# Patient Record
Sex: Female | Born: 1955
Health system: Southern US, Community
[De-identification: ages and names within clinical notes are randomized; demographics above are authoritative.]

## PROBLEM LIST (undated history)

## (undated) DIAGNOSIS — D499 Neoplasm of unspecified behavior of unspecified site: Secondary | ICD-10-CM

## (undated) DIAGNOSIS — R42 Dizziness and giddiness: Secondary | ICD-10-CM

## (undated) DIAGNOSIS — M1990 Unspecified osteoarthritis, unspecified site: Secondary | ICD-10-CM

## (undated) DIAGNOSIS — Z87898 Personal history of other specified conditions: Secondary | ICD-10-CM

## (undated) DIAGNOSIS — I10 Essential (primary) hypertension: Secondary | ICD-10-CM

## (undated) DIAGNOSIS — E785 Hyperlipidemia, unspecified: Secondary | ICD-10-CM

## (undated) HISTORY — DX: Neoplasm of unspecified behavior of unspecified site: D49.9

## (undated) HISTORY — DX: Dizziness and giddiness: R42

## (undated) HISTORY — PX: BREAST SURGERY: SHX581

## (undated) HISTORY — PX: CHOLECYSTECTOMY: SHX55

## (undated) HISTORY — DX: Essential (primary) hypertension: I10

## (undated) HISTORY — DX: Hyperlipidemia, unspecified: E78.5

## (undated) HISTORY — DX: Unspecified osteoarthritis, unspecified site: M19.90

---

## 2001-01-31 HISTORY — PX: ABDOMINAL HYSTERECTOMY: SHX81

## 2006-08-31 ENCOUNTER — Ambulatory Visit: Payer: Self-pay | Admitting: Obstetrics & Gynecology

## 2006-11-06 ENCOUNTER — Ambulatory Visit: Payer: Self-pay | Admitting: General Surgery

## 2006-12-01 ENCOUNTER — Ambulatory Visit: Payer: Self-pay | Admitting: General Surgery

## 2009-10-17 ENCOUNTER — Ambulatory Visit: Payer: Self-pay | Admitting: Orthopedic Surgery

## 2009-10-30 ENCOUNTER — Ambulatory Visit: Payer: Self-pay | Admitting: Anesthesiology

## 2011-04-24 ENCOUNTER — Emergency Department: Payer: Self-pay | Admitting: Emergency Medicine

## 2011-05-11 ENCOUNTER — Encounter: Payer: Self-pay | Admitting: Nurse Practitioner

## 2011-05-11 ENCOUNTER — Encounter: Payer: Self-pay | Admitting: Cardiothoracic Surgery

## 2011-06-01 ENCOUNTER — Encounter: Payer: Self-pay | Admitting: Cardiothoracic Surgery

## 2011-06-01 ENCOUNTER — Encounter: Payer: Self-pay | Admitting: Nurse Practitioner

## 2011-06-06 LAB — WOUND CULTURE

## 2011-07-02 ENCOUNTER — Encounter: Payer: Self-pay | Admitting: Cardiothoracic Surgery

## 2011-07-02 ENCOUNTER — Encounter: Payer: Self-pay | Admitting: Nurse Practitioner

## 2011-08-01 ENCOUNTER — Encounter: Payer: Self-pay | Admitting: Cardiothoracic Surgery

## 2011-08-01 ENCOUNTER — Encounter: Payer: Self-pay | Admitting: Nurse Practitioner

## 2011-09-01 ENCOUNTER — Encounter: Payer: Self-pay | Admitting: Nurse Practitioner

## 2011-09-01 ENCOUNTER — Encounter: Payer: Self-pay | Admitting: Cardiothoracic Surgery

## 2019-04-16 ENCOUNTER — Other Ambulatory Visit: Payer: Self-pay

## 2019-04-16 ENCOUNTER — Ambulatory Visit (INDEPENDENT_AMBULATORY_CARE_PROVIDER_SITE_OTHER): Payer: Medicare Other | Admitting: Nurse Practitioner

## 2019-04-16 ENCOUNTER — Encounter: Payer: Self-pay | Admitting: Nurse Practitioner

## 2019-04-16 VITALS — BP 138/98 | HR 80 | Temp 98.5°F | Ht 63.0 in | Wt 301.0 lb

## 2019-04-16 DIAGNOSIS — J309 Allergic rhinitis, unspecified: Secondary | ICD-10-CM | POA: Insufficient documentation

## 2019-04-16 DIAGNOSIS — M1990 Unspecified osteoarthritis, unspecified site: Secondary | ICD-10-CM | POA: Diagnosis not present

## 2019-04-16 DIAGNOSIS — J301 Allergic rhinitis due to pollen: Secondary | ICD-10-CM | POA: Diagnosis not present

## 2019-04-16 DIAGNOSIS — I10 Essential (primary) hypertension: Secondary | ICD-10-CM | POA: Insufficient documentation

## 2019-04-16 DIAGNOSIS — Z6841 Body Mass Index (BMI) 40.0 and over, adult: Secondary | ICD-10-CM | POA: Insufficient documentation

## 2019-04-16 DIAGNOSIS — Z7689 Persons encountering health services in other specified circumstances: Secondary | ICD-10-CM

## 2019-04-16 DIAGNOSIS — R030 Elevated blood-pressure reading, without diagnosis of hypertension: Secondary | ICD-10-CM

## 2019-04-16 DIAGNOSIS — Z0000 Encounter for general adult medical examination without abnormal findings: Secondary | ICD-10-CM | POA: Insufficient documentation

## 2019-04-16 NOTE — Assessment & Plan Note (Signed)
Chronic, ongoing for years.  Continue use of Flonase as needed for symptom control.  Recommend avoidance of irritants.

## 2019-04-16 NOTE — Progress Notes (Signed)
New Patient Office Visit  Subjective:  Patient ID: Jasmine Webb, female    DOB: [birth year]  Age: 64 y.o. MRN: [id number]  CC:  Chief Complaint  Patient presents with  . Establish Care    HPI Jasmine Webb presents for new patient visit to establish care.  Introduced to Designer, jewellery role and practice setting.  All questions answered.  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    Has not had a pap smear in 14 years, had complete hysterectomy including cervix.  Had a benign tumor removed.  Has not had a mammogram in a few years.  Did have a colonoscopy at age 26 or around there.  Does not get vaccinations, refuses.  [employer] recently came out to home and A1C 5.4%, plus normal circulation  Currently takes Flonase for allergies daily and Tylenol for arthritis pain as needed, every day for past two weeks has taken.  Arthritis present to hands, shoulder, knees.    HYPERTENSION Noted on exam today, no medications at this time.  She reports it was up the other day when insurance company came to visit, this was due to being up all night. Her son and grandchildren live with her.  Never had elevations in BP before per her report. Recurrent headaches: no Visual changes: no Palpitations: no Dyspnea: no Chest pain: no Lower extremity edema: little bit Dizzy/lightheaded: no  Past Medical History:  Diagnosis Date  . Tumor    breast and pelvic    Past Surgical History:  Procedure Laterality Date  . ABDOMINAL HYSTERECTOMY  2003    Family History  Problem Relation Age of Onset  . COPD Mother   . Heart disease Sister   . Diabetes Brother   . Hypertension Brother   . Asthma Son   . Breast cancer Sister   . Colon cancer Sister   . Sinusitis Son     Social History   Socioeconomic History  . Marital status: Married    Spouse name: Not on file  . Number of children: Not on file  . Years of education: Not on file  . Highest education level: Not on  file  Occupational History  . Not on file  Tobacco Use  . Smoking status: Never Smoker  . Smokeless tobacco: Never Used  Substance and Sexual Activity  . Alcohol use: Never  . Drug use: Never  . Sexual activity: Not Currently  Other Topics Concern  . Not on file  Social History Narrative  . Not on file   Social Determinants of Health   Financial Resource Strain: Low Risk   . Difficulty of Paying Living Expenses: Not hard at all  Food Insecurity: No Food Insecurity  . Worried About Charity fundraiser in the Last Year: Never true  . Ran Out of Food in the Last Year: Never true  Transportation Needs: No Transportation Needs  . Lack of Transportation (Medical): No  . Lack of Transportation (Non-Medical): No  Physical Activity: Insufficiently Active  . Days of Exercise per Week: 3 days  . Minutes of Exercise per Session: 30 min  Stress: No Stress Concern Present  . Feeling of Stress : Not at all  Social Connections: Unknown  . Frequency of Communication with Friends and Family: More than three times a week  . Frequency of Social Gatherings with Friends and Family: More than three times a week  . Attends Religious Services: More than  4 times per year  . Active Member of Clubs or Organizations: No  . Attends Archivist Meetings: Never  . Marital Status: Not on file  Intimate Partner Violence:   . Fear of Current or Ex-Partner:   . Emotionally Abused:   Marland Kitchen Physically Abused:   . Sexually Abused:     ROS Review of Systems  Constitutional: Negative for activity change, appetite change, diaphoresis, fatigue and fever.  Respiratory: Negative for cough, chest tightness and shortness of breath.   Cardiovascular: Negative for chest pain, palpitations and leg swelling.  Gastrointestinal: Negative for abdominal distention, abdominal pain, constipation, diarrhea, nausea and vomiting.  Endocrine: Negative for cold intolerance, heat intolerance, polydipsia, polyphagia and  polyuria.  Neurological: Negative for dizziness, syncope, weakness, light-headedness, numbness and headaches.  Psychiatric/Behavioral: Negative.     Objective:   Today's Vitals: BP (!) 138/98 (BP Location: Left Arm, Patient Position: Sitting)   Pulse 80   Temp 98.5 F (36.9 C) (Oral)   Ht 5\' 3"  (1.6 m)   Wt (!) 301 lb (136.5 kg)   LMP  (LMP Unknown)   SpO2 96%   BMI 53.32 kg/m   Physical Exam Vitals and nursing note reviewed.  Constitutional:      General: She is awake. She is not in acute distress.    Appearance: She is well-developed and well-groomed. She is morbidly obese. She is not ill-appearing.  HENT:     Head: Normocephalic.     Right Ear: Hearing normal.     Left Ear: Hearing normal.  Eyes:     General: Lids are normal.        Right eye: No discharge.        Left eye: No discharge.     Conjunctiva/sclera: Conjunctivae normal.     Pupils: Pupils are equal, round, and reactive to light.  Neck:     Thyroid: No thyromegaly.     Vascular: No carotid bruit.  Cardiovascular:     Rate and Rhythm: Normal rate and regular rhythm.     Heart sounds: Normal heart sounds. No murmur. No gallop.   Pulmonary:     Effort: Pulmonary effort is normal. No accessory muscle usage or respiratory distress.     Breath sounds: Normal breath sounds.  Abdominal:     General: Bowel sounds are normal.     Palpations: Abdomen is soft.  Musculoskeletal:     Cervical back: Normal range of motion and neck supple.     Right lower leg: Edema (trace) present.     Left lower leg: Edema (trace) present.  Skin:    General: Skin is warm and dry.  Neurological:     Mental Status: She is alert and oriented to person, place, and time.  Psychiatric:        Attention and Perception: Attention normal.        Mood and Affect: Mood normal.        Speech: Speech normal.        Behavior: Behavior normal. Behavior is cooperative.        Thought Content: Thought content normal.     Assessment &  Plan:   Problem List Items Addressed This Visit      Respiratory   Allergic rhinitis    Chronic, ongoing for years.  Continue use of Flonase as needed for symptom control.  Recommend avoidance of irritants.          Musculoskeletal and Integument   Arthritis    Chronic,  ongoing.  Suspect OA to multiple joints (hands, knees, shoulders).  Recommend continued use of Tylenol as needed.  Use of heat/ice and OTC creams like Voltaren gel.  Avoid Ibuprofen products.  Discussed benefit of modest weight loss and focus on healthy diet and regular exercise.      Relevant Medications   acetaminophen (TYLENOL) 500 MG tablet     Other   Elevated BP without diagnosis of hypertension    Elevations in office.  Recommend she monitor BP at least a few times a week and document over next 4 weeks for provider.  Focus on DASH diet at home, provided information to her on this.  Recommend focus on modest weight loss.  Return to office for annual physical in 4 weeks, if continued elevation then consider addition of medication.  Discussed at length with her.      Preventative health care    NEEDS: - DEXA - Mammogram - PNA vaccine - Tetanus - colonoscopy - flu vaccine Discussed at length with patient recommendations for each and needs.  Will follow-up at physical and order if patient agreeable.      BMI 50.0-59.9, adult ([place])    Recommend continued focus on healthy diet choices and regular physical activity (30 minutes 5 days a week).  Focus on small goals at a time and set timeline to achieve these goals.       Other Visit Diagnoses    Encounter to establish care    -  Primary      Outpatient Encounter Medications as of 04/16/2019  Medication Sig  . acetaminophen (TYLENOL) 500 MG tablet Take 500 mg by mouth every 6 (six) hours as needed.  . fluticasone (FLONASE) 50 MCG/ACT nasal spray Place 2 sprays into both nostrils daily.   No facility-administered encounter medications on file as of  04/16/2019.    Follow-up: Return in about 4 weeks (around 05/14/2019) for Annual physical.   Venita Lick, NP

## 2019-04-16 NOTE — Patient Instructions (Signed)
DASH Eating Plan DASH stands for "Dietary Approaches to Stop Hypertension." The DASH eating plan is a healthy eating plan that has been shown to reduce high blood pressure (hypertension). It may also reduce your risk for type 2 diabetes, heart disease, and stroke. The DASH eating plan may also help with weight loss. What are tips for following this plan?  General guidelines  Avoid eating more than 2,300 mg (milligrams) of salt (sodium) a day. If you have hypertension, you may need to reduce your sodium intake to 1,500 mg a day.  Limit alcohol intake to no more than 1 drink a day for nonpregnant women and 2 drinks a day for men. One drink equals 12 oz of beer, 5 oz of wine, or 1 oz of hard liquor.  Work with your health care provider to maintain a healthy body weight or to lose weight. Ask what an ideal weight is for you.  Get at least 30 minutes of exercise that causes your heart to beat faster (aerobic exercise) most days of the week. Activities may include walking, swimming, or biking.  Work with your health care provider or diet and nutrition specialist (dietitian) to adjust your eating plan to your individual calorie needs. Reading food labels   Check food labels for the amount of sodium per serving. Choose foods with less than 5 percent of the Daily Value of sodium. Generally, foods with less than 300 mg of sodium per serving fit into this eating plan.  To find whole grains, look for the word "whole" as the first word in the ingredient list. Shopping  Buy products labeled as "low-sodium" or "no salt added."  Buy fresh foods. Avoid canned foods and premade or frozen meals. Cooking  Avoid adding salt when cooking. Use salt-free seasonings or herbs instead of table salt or sea salt. Check with your health care provider or pharmacist before using salt substitutes.  Do not fry foods. Cook foods using healthy methods such as baking, boiling, grilling, and broiling instead.  Cook with  heart-healthy oils, such as olive, canola, soybean, or sunflower oil. Meal planning  Eat a balanced diet that includes: ? 5 or more servings of fruits and vegetables each day. At each meal, try to fill half of your plate with fruits and vegetables. ? Up to 6-8 servings of whole grains each day. ? Less than 6 oz of lean meat, poultry, or fish each day. A 3-oz serving of meat is about the same size as a deck of cards. One egg equals 1 oz. ? 2 servings of low-fat dairy each day. ? A serving of nuts, seeds, or beans 5 times each week. ? Heart-healthy fats. Healthy fats called Omega-3 fatty acids are found in foods such as flaxseeds and coldwater fish, like sardines, salmon, and mackerel.  Limit how much you eat of the following: ? Canned or prepackaged foods. ? Food that is high in trans fat, such as fried foods. ? Food that is high in saturated fat, such as fatty meat. ? Sweets, desserts, sugary drinks, and other foods with added sugar. ? Full-fat dairy products.  Do not salt foods before eating.  Try to eat at least 2 vegetarian meals each week.  Eat more home-cooked food and less restaurant, buffet, and fast food.  When eating at a restaurant, ask that your food be prepared with less salt or no salt, if possible. What foods are recommended? The items listed may not be a complete list. Talk with your dietitian about   what dietary choices are best for you. Grains Whole-grain or whole-wheat bread. Whole-grain or whole-wheat pasta. Brown rice. Oatmeal. Quinoa. Bulgur. Whole-grain and low-sodium cereals. Pita bread. Low-fat, low-sodium crackers. Whole-wheat flour tortillas. Vegetables Fresh or frozen vegetables (raw, steamed, roasted, or grilled). Low-sodium or reduced-sodium tomato and vegetable juice. Low-sodium or reduced-sodium tomato sauce and tomato paste. Low-sodium or reduced-sodium canned vegetables. Fruits All fresh, dried, or frozen fruit. Canned fruit in natural juice (without  added sugar). Meat and other protein foods Skinless chicken or turkey. Ground chicken or turkey. Pork with fat trimmed off. Fish and seafood. Egg whites. Dried beans, peas, or lentils. Unsalted nuts, nut butters, and seeds. Unsalted canned beans. Lean cuts of beef with fat trimmed off. Low-sodium, lean deli meat. Dairy Low-fat (1%) or fat-free (skim) milk. Fat-free, low-fat, or reduced-fat cheeses. Nonfat, low-sodium ricotta or cottage cheese. Low-fat or nonfat yogurt. Low-fat, low-sodium cheese. Fats and oils Soft margarine without trans fats. Vegetable oil. Low-fat, reduced-fat, or light mayonnaise and salad dressings (reduced-sodium). Canola, safflower, olive, soybean, and sunflower oils. Avocado. Seasoning and other foods Herbs. Spices. Seasoning mixes without salt. Unsalted popcorn and pretzels. Fat-free sweets. What foods are not recommended? The items listed may not be a complete list. Talk with your dietitian about what dietary choices are best for you. Grains Baked goods made with fat, such as croissants, muffins, or some breads. Dry pasta or rice meal packs. Vegetables Creamed or fried vegetables. Vegetables in a cheese sauce. Regular canned vegetables (not low-sodium or reduced-sodium). Regular canned tomato sauce and paste (not low-sodium or reduced-sodium). Regular tomato and vegetable juice (not low-sodium or reduced-sodium). Pickles. Olives. Fruits Canned fruit in a light or heavy syrup. Fried fruit. Fruit in cream or butter sauce. Meat and other protein foods Fatty cuts of meat. Ribs. Fried meat. Bacon. Sausage. Bologna and other processed lunch meats. Salami. Fatback. Hotdogs. Bratwurst. Salted nuts and seeds. Canned beans with added salt. Canned or smoked fish. Whole eggs or egg yolks. Chicken or turkey with skin. Dairy Whole or 2% milk, cream, and half-and-half. Whole or full-fat cream cheese. Whole-fat or sweetened yogurt. Full-fat cheese. Nondairy creamers. Whipped toppings.  Processed cheese and cheese spreads. Fats and oils Butter. Stick margarine. Lard. Shortening. Ghee. Bacon fat. Tropical oils, such as coconut, palm kernel, or palm oil. Seasoning and other foods Salted popcorn and pretzels. Onion salt, garlic salt, seasoned salt, table salt, and sea salt. Worcestershire sauce. Tartar sauce. Barbecue sauce. Teriyaki sauce. Soy sauce, including reduced-sodium. Steak sauce. Canned and packaged gravies. Fish sauce. Oyster sauce. Cocktail sauce. Horseradish that you find on the shelf. Ketchup. Mustard. Meat flavorings and tenderizers. Bouillon cubes. Hot sauce and Tabasco sauce. Premade or packaged marinades. Premade or packaged taco seasonings. Relishes. Regular salad dressings. Where to find more information:  National Heart, Lung, and Blood Institute: www.nhlbi.nih.gov  American Heart Association: www.heart.org Summary  The DASH eating plan is a healthy eating plan that has been shown to reduce high blood pressure (hypertension). It may also reduce your risk for type 2 diabetes, heart disease, and stroke.  With the DASH eating plan, you should limit salt (sodium) intake to 2,300 mg a day. If you have hypertension, you may need to reduce your sodium intake to 1,500 mg a day.  When on the DASH eating plan, aim to eat more fresh fruits and vegetables, whole grains, lean proteins, low-fat dairy, and heart-healthy fats.  Work with your health care provider or diet and nutrition specialist (dietitian) to adjust your eating plan to your   individual calorie needs. This information is not intended to replace advice given to you by your health care provider. Make sure you discuss any questions you have with your health care provider. Document Revised: 12/30/2016 Document Reviewed: 01/11/2016 Elsevier Patient Education  2020 [employer]  

## 2019-04-16 NOTE — Assessment & Plan Note (Signed)
Elevations in office.  Recommend she monitor BP at least a few times a week and document over next 4 weeks for provider.  Focus on DASH diet at home, provided information to her on this.  Recommend focus on modest weight loss.  Return to office for annual physical in 4 weeks, if continued elevation then consider addition of medication.  Discussed at length with her.

## 2019-04-16 NOTE — Assessment & Plan Note (Signed)
Recommend continued focus on healthy diet choices and regular physical activity (30 minutes 5 days a week).  Focus on small goals at a time and set timeline to achieve these goals.

## 2019-04-16 NOTE — Assessment & Plan Note (Signed)
Chronic, ongoing.  Suspect OA to multiple joints (hands, knees, shoulders).  Recommend continued use of Tylenol as needed.  Use of heat/ice and OTC creams like Voltaren gel.  Avoid Ibuprofen products.  Discussed benefit of modest weight loss and focus on healthy diet and regular exercise.

## 2019-04-16 NOTE — Assessment & Plan Note (Signed)
NEEDS: - DEXA - Mammogram - PNA vaccine - Tetanus - colonoscopy - flu vaccine Discussed at length with patient recommendations for each and needs.  Will follow-up at physical and order if patient agreeable.

## 2019-05-13 LAB — FECAL OCCULT BLOOD, IMMUNOCHEMICAL: IFOBT: NEGATIVE

## 2019-05-21 ENCOUNTER — Encounter: Payer: Self-pay | Admitting: Nurse Practitioner

## 2019-05-22 ENCOUNTER — Ambulatory Visit (INDEPENDENT_AMBULATORY_CARE_PROVIDER_SITE_OTHER): Payer: Medicare Other | Admitting: Nurse Practitioner

## 2019-05-22 ENCOUNTER — Other Ambulatory Visit: Payer: Self-pay

## 2019-05-22 ENCOUNTER — Encounter: Payer: Self-pay | Admitting: Nurse Practitioner

## 2019-05-22 VITALS — BP 152/88 | HR 69 | Temp 98.2°F | Ht 63.0 in | Wt 305.2 lb

## 2019-05-22 DIAGNOSIS — E559 Vitamin D deficiency, unspecified: Secondary | ICD-10-CM | POA: Diagnosis not present

## 2019-05-22 DIAGNOSIS — Z0000 Encounter for general adult medical examination without abnormal findings: Secondary | ICD-10-CM | POA: Diagnosis not present

## 2019-05-22 DIAGNOSIS — Z780 Asymptomatic menopausal state: Secondary | ICD-10-CM

## 2019-05-22 DIAGNOSIS — Z1231 Encounter for screening mammogram for malignant neoplasm of breast: Secondary | ICD-10-CM

## 2019-05-22 DIAGNOSIS — Z6841 Body Mass Index (BMI) 40.0 and over, adult: Secondary | ICD-10-CM

## 2019-05-22 DIAGNOSIS — I10 Essential (primary) hypertension: Secondary | ICD-10-CM | POA: Diagnosis not present

## 2019-05-22 DIAGNOSIS — Z1329 Encounter for screening for other suspected endocrine disorder: Secondary | ICD-10-CM | POA: Diagnosis not present

## 2019-05-22 DIAGNOSIS — R030 Elevated blood-pressure reading, without diagnosis of hypertension: Secondary | ICD-10-CM | POA: Diagnosis not present

## 2019-05-22 DIAGNOSIS — J301 Allergic rhinitis due to pollen: Secondary | ICD-10-CM

## 2019-05-22 DIAGNOSIS — R5383 Other fatigue: Secondary | ICD-10-CM | POA: Diagnosis not present

## 2019-05-22 DIAGNOSIS — Z136 Encounter for screening for cardiovascular disorders: Secondary | ICD-10-CM | POA: Diagnosis not present

## 2019-05-22 DIAGNOSIS — M1990 Unspecified osteoarthritis, unspecified site: Secondary | ICD-10-CM | POA: Diagnosis not present

## 2019-05-22 MED ORDER — LOSARTAN POTASSIUM 50 MG PO TABS: 50.0000 mg | ORAL_TABLET | Freq: Every day | ORAL | 3 refills | Status: DC

## 2019-05-22 NOTE — Patient Instructions (Signed)
To schedule mammogram and bone scan please call following: [facility] at [place]: [address] [place], [place], [place] [address]  Phone: [phone]   DASH Eating Plan DASH stands for "Dietary Approaches to Stop Hypertension." The DASH eating plan is a healthy eating plan that has been shown to reduce high blood pressure (hypertension). It may also reduce your risk for type 2 diabetes, heart disease, and stroke. The DASH eating plan may also help with weight loss. What are tips for following this plan?  General guidelines  Avoid eating more than 2,300 mg (milligrams) of salt (sodium) a day. If you have hypertension, you may need to reduce your sodium intake to 1,500 mg a day.  Limit alcohol intake to no more than 1 drink a day for nonpregnant women and 2 drinks a day for men. One drink equals 12 oz of beer, 5 oz of wine, or 1 oz of hard liquor.  Work with your health care provider to maintain a healthy body weight or to lose weight. Ask what an ideal weight is for you.  Get at least 30 minutes of exercise that causes your heart to beat faster (aerobic exercise) most days of the week. Activities may include walking, swimming, or biking.  Work with your health care provider or diet and nutrition specialist (dietitian) to adjust your eating plan to your individual calorie needs. Reading food labels   Check food labels for the amount of sodium per serving. Choose foods with less than 5 percent of the Daily Value of sodium. Generally, foods with less than 300 mg of sodium per serving fit into this eating plan.  To find whole grains, look for the word "whole" as the first word in the ingredient list. Shopping  Buy products labeled as "low-sodium" or "no salt added."  Buy fresh foods. Avoid canned foods and premade or frozen meals. Cooking  Avoid adding salt when cooking. Use salt-free seasonings or herbs instead of table salt or sea salt. Check with  your health care provider or pharmacist before using salt substitutes.  Do not fry foods. Cook foods using healthy methods such as baking, boiling, grilling, and broiling instead.  Cook with heart-healthy oils, such as olive, canola, soybean, or sunflower oil. Meal planning  Eat a balanced diet that includes: ? 5 or more servings of fruits and vegetables each day. At each meal, try to fill half of your plate with fruits and vegetables. ? Up to 6-8 servings of whole grains each day. ? Less than 6 oz of lean meat, poultry, or fish each day. A 3-oz serving of meat is about the same size as a deck of cards. One egg equals 1 oz. ? 2 servings of low-fat dairy each day. ? A serving of nuts, seeds, or beans 5 times each week. ? Heart-healthy fats. Healthy fats called Omega-3 fatty acids are found in foods such as flaxseeds and coldwater fish, like sardines, salmon, and mackerel.  Limit how much you eat of the following: ? Canned or prepackaged foods. ? Food that is high in trans fat, such as fried foods. ? Food that is high in saturated fat, such as fatty meat. ? Sweets, desserts, sugary drinks, and other foods with added sugar. ? Full-fat dairy products.  Do not salt foods before eating.  Try to eat at least 2 vegetarian meals each week.  Eat more home-cooked food and less restaurant, buffet, and fast food.  When eating at a restaurant, ask  that your food be prepared with less salt or no salt, if possible. What foods are recommended? The items listed may not be a complete list. Talk with your dietitian about what dietary choices are best for you. Grains Whole-grain or whole-wheat bread. Whole-grain or whole-wheat pasta. Brown rice. [place]row. Bulgur. Whole-grain and low-sodium cereals. Pita bread. Low-fat, low-sodium crackers. Whole-wheat flour tortillas. Vegetables Fresh or frozen vegetables (raw, steamed, roasted, or grilled). Low-sodium or reduced-sodium tomato and vegetable  juice. Low-sodium or reduced-sodium tomato sauce and tomato paste. Low-sodium or reduced-sodium canned vegetables. Fruits All fresh, dried, or frozen fruit. Canned fruit in natural juice (without added sugar). Meat and other protein foods Skinless chicken or [place]. Ground chicken or [place]. Pork with fat trimmed off. Fish and seafood. Egg whites. Dried beans, peas, or lentils. Unsalted nuts, nut butters, and seeds. Unsalted canned beans. Lean cuts of beef with fat trimmed off. Low-sodium, lean deli meat. Dairy Low-fat (1%) or fat-free (skim) milk. Fat-free, low-fat, or reduced-fat cheeses. Nonfat, low-sodium ricotta or cottage cheese. Low-fat or nonfat yogurt. Low-fat, low-sodium cheese. Fats and oils Soft margarine without trans fats. Vegetable oil. Low-fat, reduced-fat, or light mayonnaise and salad dressings (reduced-sodium). Canola, safflower, olive, soybean, and sunflower oils. Avocado. Seasoning and other foods Herbs. Spices. Seasoning mixes without salt. Unsalted popcorn and pretzels. Fat-free sweets. What foods are not recommended? The items listed may not be a complete list. Talk with your dietitian about what dietary choices are best for you. Grains Baked goods made with fat, such as croissants, muffins, or some breads. Dry pasta or rice meal packs. Vegetables Creamed or fried vegetables. Vegetables in a cheese sauce. Regular canned vegetables (not low-sodium or reduced-sodium). Regular canned tomato sauce and paste (not low-sodium or reduced-sodium). Regular tomato and vegetable juice (not low-sodium or reduced-sodium). Angie Fava. Olives. Fruits Canned fruit in a light or heavy syrup. Fried fruit. Fruit in cream or butter sauce. Meat and other protein foods Fatty cuts of meat. Ribs. Fried meat. Berniece Salines. Sausage. Bologna and other processed lunch meats. Salami. Fatback. Hotdogs. Bratwurst. Salted nuts and seeds. Canned beans with added salt. Canned or smoked fish. Whole eggs or egg yolks.  Chicken or [place] with skin. Dairy Whole or 2% milk, cream, and half-and-half. Whole or full-fat cream cheese. Whole-fat or sweetened yogurt. Full-fat cheese. Nondairy creamers. Whipped toppings. Processed cheese and cheese spreads. Fats and oils Butter. Stick margarine. Lard. Shortening. Ghee. Bacon fat. Tropical oils, such as coconut, palm kernel, or palm oil. Seasoning and other foods Salted popcorn and pretzels. Onion salt, garlic salt, seasoned salt, table salt, and sea salt. Worcestershire sauce. Tartar sauce. Barbecue sauce. Teriyaki sauce. Soy sauce, including reduced-sodium. Steak sauce. Canned and packaged gravies. Fish sauce. Oyster sauce. Cocktail sauce. Horseradish that you find on the shelf. Ketchup. Mustard. Meat flavorings and tenderizers. Bouillon cubes. Hot sauce and Tabasco sauce. Premade or packaged marinades. Premade or packaged taco seasonings. Relishes. Regular salad dressings. Where to find more information:  National Heart, Lung, and [place]: https://wilson-eaton.com/  American Heart Association: www.heart.org Summary  The DASH eating plan is a healthy eating plan that has been shown to reduce high blood pressure (hypertension). It may also reduce your risk for type 2 diabetes, heart disease, and stroke.  With the DASH eating plan, you should limit salt (sodium) intake to 2,300 mg a day. If you have hypertension, you may need to reduce your sodium intake to 1,500 mg a day.  When on the DASH eating plan, aim to eat more fresh fruits  and vegetables, whole grains, lean proteins, low-fat dairy, and heart-healthy fats.  Work with your health care provider or diet and nutrition specialist (dietitian) to adjust your eating plan to your individual calorie needs. This information is not intended to replace advice given to you by your health care provider. Make sure you discuss any questions you have with your health care provider. Document Revised: 12/30/2016 Document Reviewed:  01/11/2016 Elsevier Patient Education  2020 [employer].

## 2019-05-22 NOTE — Assessment & Plan Note (Signed)
New diagnosis, BP remains elevated in office and on home readings.  Recommend she continue to monitor BP at home regularly and document.  Praised for doing this and bringing to office today.  Will start Losartan 50 MG daily, script sent.  Plan to hold this and change regimen if K+ returns elevated on labs obtained today.  CBC, CMP, TSH, lipid panel today.  Return in 6 weeks for follow-up.

## 2019-05-22 NOTE — Assessment & Plan Note (Signed)
NEEDS: - DEXA - ordered - Mammogram - ordered - PNA vaccine - refuses - Tetanus - refuses - colonoscopy - iFOBT done in April 2021 -- negative - flu vaccine -- refuses Discussed at length with patient recommendations for each and needs. She refuses vaccinations.

## 2019-05-22 NOTE — Assessment & Plan Note (Signed)
Recommended eating smaller high protein, low fat meals more frequently and exercising 30 mins a day 5 times a week with a goal of 10-15lb weight loss in the next 3 months. Patient voiced their understanding and motivation to adhere to these recommendations.  

## 2019-05-22 NOTE — Assessment & Plan Note (Signed)
Chronic, ongoing.  Suspect OA to multiple joints (hands, knees, shoulders).  Recommend continued use of Tylenol as needed.  Use of heat/ice and OTC creams like Voltaren gel.  Avoid Ibuprofen products.  Discussed benefit of modest weight loss and focus on healthy diet and regular exercise.

## 2019-05-22 NOTE — Assessment & Plan Note (Signed)
Chronic, ongoing for years.  Continue use of Flonase as needed for symptom control.  Recommend avoidance of irritants.

## 2019-05-22 NOTE — Progress Notes (Signed)
BP (!) 152/88 (BP Location: Left Arm)   Pulse 69   Temp 98.2 F (36.8 C) (Oral)   Ht 5\' 3"  (1.6 m)   Wt (!) 305 lb 3.2 oz (138.4 kg)   LMP  (LMP Unknown)   SpO2 96%   BMI 54.06 kg/m    Subjective:    Patient ID: Jasmine Webb, female    DOB: 1955/03/31, 64 y.o.   MRN: [id number]  HPI: Jasmine Webb is a 64 y.o. female presenting on 05/22/2019 for comprehensive medical examination. Current medical complaints include:none  She currently lives with: self Menopausal Symptoms: no   HYPERTENSION Currently no medications, but elevations noted. Hypertension status: uncontrolled  BP monitoring frequency:  daily BP range: 160-170/80-90' Medication compliance: good compliance Aspirin: no Recurrent headaches: no Visual changes: no Palpitations: no Dyspnea: no Chest pain: no Lower extremity edema: no Dizzy/lightheaded: no  Depression Screen done today and results listed below:  Depression screen [facility] 2/9 05/22/2019 04/16/2019  Decreased Interest 0 0  Down, Depressed, Hopeless 0 0  PHQ - 2 Score 0 0    The patient does not have a history of falls. I did not complete a risk assessment for falls. A plan of care for falls was not documented.   Past Medical History:  Past Medical History:  Diagnosis Date  . Arthritis   . Tumor    breast and pelvic    Surgical History:  Past Surgical History:  Procedure Laterality Date  . ABDOMINAL HYSTERECTOMY  2003  . BREAST SURGERY      Medications:  Current Outpatient Medications on File Prior to Visit  Medication Sig  . acetaminophen (TYLENOL) 500 MG tablet Take 500 mg by mouth every 6 (six) hours as needed.  . fluticasone (FLONASE) 50 MCG/ACT nasal spray Place 2 sprays into both nostrils daily.   No current facility-administered medications on file prior to visit.    Allergies:  No Known Allergies  Social History:  Social History   Socioeconomic History  . Marital status: Married    Spouse name: Not on file  . Number  of children: Not on file  . Years of education: Not on file  . Highest education level: Not on file  Occupational History  . Not on file  Tobacco Use  . Smoking status: Never Smoker  . Smokeless tobacco: Never Used  Substance and Sexual Activity  . Alcohol use: Never  . Drug use: Never  . Sexual activity: Not Currently  Other Topics Concern  . Not on file  Social History Narrative  . Not on file   Social Determinants of Health   Financial Resource Strain: Low Risk   . Difficulty of Paying Living Expenses: Not hard at all  Food Insecurity: No Food Insecurity  . Worried About Charity fundraiser in the Last Year: Never true  . Ran Out of Food in the Last Year: Never true  Transportation Needs: No Transportation Needs  . Lack of Transportation (Medical): No  . Lack of Transportation (Non-Medical): No  Physical Activity: Insufficiently Active  . Days of Exercise per Week: 3 days  . Minutes of Exercise per Session: 30 min  Stress: No Stress Concern Present  . Feeling of Stress : Not at all  Social Connections: Unknown  . Frequency of Communication with Friends and Family: More than three times a week  . Frequency of Social Gatherings with Friends and Family: More than three times a week  . Attends Religious Services: More  than 4 times per year  . Active Member of Clubs or Organizations: No  . Attends Archivist Meetings: Never  . Marital Status: Not on file  Intimate Partner Violence:   . Fear of Current or Ex-Partner:   . Emotionally Abused:   Marland Kitchen Physically Abused:   . Sexually Abused:    Social History   Tobacco Use  Smoking Status Never Smoker  Smokeless Tobacco Never Used   Social History   Substance and Sexual Activity  Alcohol Use Never    Family History:  Family History  Problem Relation Age of Onset  . COPD Mother   . ALS Father   . Heart disease Sister   . Diabetes Brother   . Hypertension Brother   . Asthma Son   . Breast cancer Sister    . Colon cancer Sister   . Sinusitis Son     Past medical history, surgical history, medications, allergies, family history and social history reviewed with patient today and changes made to appropriate areas of the chart.   Review of Systems - negative All other ROS negative except what is listed above and in the HPI.      Objective:    BP (!) 152/88 (BP Location: Left Arm)   Pulse 69   Temp 98.2 F (36.8 C) (Oral)   Ht 5\' 3"  (1.6 m)   Wt (!) 305 lb 3.2 oz (138.4 kg)   LMP  (LMP Unknown)   SpO2 96%   BMI 54.06 kg/m   Wt Readings from Last 3 Encounters:  05/22/19 (!) 305 lb 3.2 oz (138.4 kg)  04/16/19 (!) 301 lb (136.5 kg)    Physical Exam Vitals and nursing note reviewed.  Constitutional:      General: She is awake. She is not in acute distress.    Appearance: She is well-developed. She is morbidly obese. She is not ill-appearing.  HENT:     Head: Normocephalic and atraumatic.     Right Ear: Hearing, tympanic membrane, ear canal and external ear normal. No drainage.     Left Ear: Hearing, tympanic membrane, ear canal and external ear normal. No drainage.     Nose: Nose normal.     Right Sinus: No maxillary sinus tenderness or frontal sinus tenderness.     Left Sinus: No maxillary sinus tenderness or frontal sinus tenderness.     Mouth/Throat:     Mouth: Mucous membranes are moist.     Pharynx: Oropharynx is clear. Uvula midline. No pharyngeal swelling, oropharyngeal exudate or posterior oropharyngeal erythema.  Eyes:     General: Lids are normal.        Right eye: No discharge.        Left eye: No discharge.     Extraocular Movements: Extraocular movements intact.     Conjunctiva/sclera: Conjunctivae normal.     Pupils: Pupils are equal, round, and reactive to light.     Visual Fields: Right eye visual fields normal and left eye visual fields normal.  Neck:     Thyroid: No thyromegaly.     Vascular: No carotid bruit.     Trachea: Trachea normal.  Cardiovascular:      Rate and Rhythm: Normal rate and regular rhythm.     Heart sounds: Normal heart sounds. No murmur. No gallop.   Pulmonary:     Effort: Pulmonary effort is normal. No accessory muscle usage or respiratory distress.     Breath sounds: Normal breath sounds.  Chest:  Breasts:        Right: Normal.        Left: Normal.  Abdominal:     General: Bowel sounds are normal.     Palpations: Abdomen is soft. There is no hepatomegaly or splenomegaly.     Tenderness: There is no abdominal tenderness.  Musculoskeletal:        General: Normal range of motion.     Cervical back: Normal range of motion and neck supple.     Right lower leg: No edema.     Left lower leg: No edema.  Lymphadenopathy:     Head:     Right side of head: No submental, submandibular, tonsillar, preauricular or posterior auricular adenopathy.     Left side of head: No submental, submandibular, tonsillar, preauricular or posterior auricular adenopathy.     Cervical: No cervical adenopathy.     Upper Body:     Right upper body: No supraclavicular, axillary or pectoral adenopathy.     Left upper body: No supraclavicular, axillary or pectoral adenopathy.  Skin:    General: Skin is warm and dry.     Capillary Refill: Capillary refill takes less than 2 seconds.     Findings: No rash.  Neurological:     Mental Status: She is alert and oriented to person, place, and time.     Cranial Nerves: Cranial nerves are intact.     Gait: Gait is intact.     Deep Tendon Reflexes: Reflexes are normal and symmetric.     Reflex Scores:      Brachioradialis reflexes are 2+ on the right side and 2+ on the left side.      Patellar reflexes are 2+ on the right side and 2+ on the left side. Psychiatric:        Attention and Perception: Attention normal.        Mood and Affect: Mood normal.        Speech: Speech normal.        Behavior: Behavior normal. Behavior is cooperative.        Thought Content: Thought content normal.         Judgment: Judgment normal.    Results for orders placed or performed in visit on 05/21/19  Fecal occult blood, imunochemical  Result Value Ref Range   IFOBT Negative       Assessment & Plan:   Problem List Items Addressed This Visit      Cardiovascular and Mediastinum   Essential hypertension    New diagnosis, BP remains elevated in office and on home readings.  Recommend she continue to monitor BP at home regularly and document.  Praised for doing this and bringing to office today.  Will start Losartan 50 MG daily, script sent.  Plan to hold this and change regimen if K+ returns elevated on labs obtained today.  CBC, CMP, TSH, lipid panel today.  Return in 6 weeks for follow-up.      Relevant Medications   losartan (COZAAR) 50 MG tablet   Other Relevant Orders   CBC with Differential/Platelet   Comprehensive metabolic panel   TSH     Respiratory   Allergic rhinitis    Chronic, ongoing for years.  Continue use of Flonase as needed for symptom control.  Recommend avoidance of irritants.          Musculoskeletal and Integument   Arthritis    Chronic, ongoing.  Suspect OA to multiple joints (hands, knees, shoulders).  Recommend continued  use of Tylenol as needed.  Use of heat/ice and OTC creams like Voltaren gel.  Avoid Ibuprofen products.  Discussed benefit of modest weight loss and focus on healthy diet and regular exercise.        Other   Preventative health care    NEEDS: - DEXA - ordered - Mammogram - ordered - PNA vaccine - refuses - Tetanus - refuses - colonoscopy - iFOBT done in April 2021 -- negative - flu vaccine -- refuses Discussed at length with patient recommendations for each and needs. She refuses vaccinations.      BMI 50.0-59.9, adult (HCC)    Recommended eating smaller high protein, low fat meals more frequently and exercising 30 mins a day 5 times a week with a goal of 10-15lb weight loss in the next 3 months. Patient voiced their understanding and  motivation to adhere to these recommendations.        Other Visit Diagnoses    Annual physical exam    -  Primary   Relevant Orders   Lipid Panel w/o Chol/HDL Ratio   Vitamin D deficiency       Reports history of low levels, check today and start supplement as needed + obtain DEXA scan.   Relevant Orders   VITAMIN D 25 Hydroxy (Vit-D Deficiency, Fractures)   Thyroid disorder screen       TSH ordered today.   Relevant Orders   TSH   Encounter for screening mammogram for malignant neoplasm of breast       Mammogram ordered   Relevant Orders   MM DIGITAL SCREENING BILATERAL   Postmenopausal estrogen deficiency       DEXA scan ordered.   Relevant Orders   DG Bone Density       Follow up plan: Return in about 6 weeks (around 07/03/2019) for HTN follow-up.   LABORATORY TESTING:  - Pap smear: not applicable  IMMUNIZATIONS:   - Tdap: Tetanus vaccination status reviewed: refuses - Influenza: refused - Pneumovax: Refused - Prevnar: Refused - HPV: Not applicable - Zostavax vaccine: Refused  SCREENING: -Mammogram: Up to date  - Colonoscopy: FIT test negative  - Bone Density: Ordered today  -Hearing Test: Not applicable  -Spirometry: Not applicable   PATIENT COUNSELING:   Advised to take 1 mg of folate supplement per day if capable of pregnancy.   Sexuality: Discussed sexually transmitted diseases, partner selection, use of condoms, avoidance of unintended pregnancy  and contraceptive alternatives.   Advised to avoid cigarette smoking.  I discussed with the patient that most people either abstain from alcohol or drink within safe limits (<=14/week and <=4 drinks/occasion for males, <=7/weeks and <= 3 drinks/occasion for females) and that the risk for alcohol disorders and other health effects rises proportionally with the number of drinks per week and how often a drinker exceeds daily limits.  Discussed cessation/primary prevention of drug use and availability of treatment  for abuse.   Diet: Encouraged to adjust caloric intake to maintain  or achieve ideal body weight, to reduce intake of dietary saturated fat and total fat, to limit sodium intake by avoiding high sodium foods and not adding table salt, and to maintain adequate dietary potassium and calcium preferably from fresh fruits, vegetables, and low-fat dairy products.    stressed the importance of regular exercise  Injury prevention: Discussed safety belts, safety helmets, smoke detector, smoking near bedding or upholstery.   Dental health: Discussed importance of regular tooth brushing, flossing, and dental visits.  NEXT PREVENTATIVE PHYSICAL DUE IN 1 YEAR. Return in about 6 weeks (around 07/03/2019) for HTN follow-up.

## 2019-05-23 LAB — COMPREHENSIVE METABOLIC PANEL
ALT: 11 IU/L (ref 0–32)
AST: 16 IU/L (ref 0–40)
Albumin/Globulin Ratio: 1.9 (ref 1.2–2.2)
Albumin: 4.3 g/dL (ref 3.8–4.8)
Alkaline Phosphatase: 97 IU/L (ref 39–117)
BUN/Creatinine Ratio: 26 (ref 12–28)
BUN: 18 mg/dL (ref 8–27)
Bilirubin Total: 0.6 mg/dL (ref 0.0–1.2)
CO2: 26 mmol/L (ref 20–29)
Calcium: 9.2 mg/dL (ref 8.7–10.3)
Chloride: 101 mmol/L (ref 96–106)
Creatinine, Ser: 0.69 mg/dL (ref 0.57–1.00)
GFR calc Af Amer: 106 mL/min/{1.73_m2} (ref >59)
GFR calc non Af Amer: 92 mL/min/{1.73_m2} (ref >59)
Globulin, Total: 2.3 g/dL (ref 1.5–4.5)
Glucose: 92 mg/dL (ref 65–99)
Potassium: 3.6 mmol/L (ref 3.5–5.2)
Sodium: 140 mmol/L (ref 134–144)
Total Protein: 6.6 g/dL (ref 6.0–8.5)

## 2019-05-23 LAB — CBC WITH DIFFERENTIAL/PLATELET
Basophils Absolute: 0 10*3/uL (ref 0.0–0.2)
Basos: 1 %
EOS (ABSOLUTE): 0.2 10*3/uL (ref 0.0–0.4)
Eos: 3 %
Hematocrit: 40.4 % (ref 34.0–46.6)
Hemoglobin: 13.6 g/dL (ref 11.1–15.9)
Immature Grans (Abs): 0 10*3/uL (ref 0.0–0.1)
Immature Granulocytes: 1 %
Lymphocytes Absolute: 1.4 10*3/uL (ref 0.7–3.1)
Lymphs: 20 %
MCH: 28.8 pg (ref 26.6–33.0)
MCHC: 33.7 g/dL (ref 31.5–35.7)
MCV: 85 fL (ref 79–97)
Monocytes Absolute: 0.5 10*3/uL (ref 0.1–0.9)
Monocytes: 8 %
Neutrophils Absolute: 4.5 10*3/uL (ref 1.4–7.0)
Neutrophils: 67 %
Platelets: 192 10*3/uL (ref 150–450)
RBC: 4.73 x10E6/uL (ref 3.77–5.28)
RDW: 12.7 % (ref 11.7–15.4)
WBC: 6.6 10*3/uL (ref 3.4–10.8)

## 2019-05-23 LAB — LIPID PANEL W/O CHOL/HDL RATIO
Cholesterol, Total: 179 mg/dL (ref 100–199)
HDL: 49 mg/dL (ref >39)
LDL Chol Calc (NIH): 105 mg/dL — ABNORMAL HIGH (ref 0–99)
Triglycerides: 140 mg/dL (ref 0–149)
VLDL Cholesterol Cal: 25 mg/dL (ref 5–40)

## 2019-05-23 LAB — TSH: TSH: 1.76 u[IU]/mL (ref 0.450–4.500)

## 2019-05-23 LAB — VITAMIN D 25 HYDROXY (VIT D DEFICIENCY, FRACTURES): Vit D, 25-Hydroxy: 22.3 ng/mL — ABNORMAL LOW (ref 30.0–100.0)

## 2019-05-23 NOTE — Progress Notes (Signed)
Good morning, please let Niomie know that labs have returned: - CBC is normal, no anemia - Kidney, liver, and electrolytes are all normal - Thyroid testing normal - Vitamin D level is slightly low, I recommend she take Vitamin D3 1000 units daily which she can obtain in any vitamin section, this is good for bone and muscle health. - Her LDL (bad cholesterol) is mildly elevated and total cholesterol normal.  I do recommend heavy focus on low cholesterol diet, there is a lot of information online available on this.  At this time we do not need to start medication, but overtime if risk score or levels increase we will need to discuss this.  Will recheck in 6 months. If any questions or concerns please let me know.  Have a great day!!

## 2019-07-05 ENCOUNTER — Encounter: Payer: Self-pay | Admitting: Nurse Practitioner

## 2019-07-05 ENCOUNTER — Other Ambulatory Visit: Payer: Self-pay

## 2019-07-05 ENCOUNTER — Ambulatory Visit (INDEPENDENT_AMBULATORY_CARE_PROVIDER_SITE_OTHER): Payer: Medicare Other | Admitting: Nurse Practitioner

## 2019-07-05 VITALS — BP 142/84 | HR 65 | Temp 97.8°F | Ht 64.0 in | Wt 306.0 lb

## 2019-07-05 DIAGNOSIS — Z6841 Body Mass Index (BMI) 40.0 and over, adult: Secondary | ICD-10-CM | POA: Diagnosis not present

## 2019-07-05 DIAGNOSIS — E7800 Pure hypercholesterolemia, unspecified: Secondary | ICD-10-CM | POA: Diagnosis not present

## 2019-07-05 DIAGNOSIS — E559 Vitamin D deficiency, unspecified: Secondary | ICD-10-CM

## 2019-07-05 DIAGNOSIS — E785 Hyperlipidemia, unspecified: Secondary | ICD-10-CM | POA: Insufficient documentation

## 2019-07-05 DIAGNOSIS — I10 Essential (primary) hypertension: Secondary | ICD-10-CM | POA: Diagnosis not present

## 2019-07-05 MED ORDER — LOSARTAN POTASSIUM 100 MG PO TABS: 100.0000 mg | ORAL_TABLET | Freq: Every day | ORAL | 3 refills | Status: DC

## 2019-07-05 NOTE — Assessment & Plan Note (Signed)
Noted on recent labs 22.3.  Recommend she start supplement Vitamin D3 1000 units daily for bone health.

## 2019-07-05 NOTE — Assessment & Plan Note (Addendum)
Ongoing with BP remaining elevated above goal in office and on home readings.  Recommend she continue to monitor BP at home regularly and document.  Will increase Losartan to 100 MG daily (recent K+ 3.6), script sent, recommend she complete her 50 MG prescription by taking 2 tablets a day and then pick up 100 MG tablets.  BMP today.  Return in 6 weeks for follow-up.

## 2019-07-05 NOTE — Progress Notes (Signed)
BP (!) 142/84 (BP Location: Left Arm)   Pulse 65   Temp 97.8 F (36.6 C) (Oral)   Ht 5\' 4"  (1.626 m)   Wt (!) 306 lb (138.8 kg)   LMP  (LMP Unknown)   SpO2 96%   BMI 52.52 kg/m    Subjective:    Patient ID: Jasmine Webb, female    DOB: [birth year], 64 y.o.   MRN: [id number]  HPI: Jasmine Webb is a 64 y.o. female  Chief Complaint  Patient presents with  . Hypertension   HYPERTENSION Started on Losartan 50 MG daily at visit on 05/22/19. K+ 3.6 on recent labs.  At annual physical noted LDL slightly elevated at 105.  Vitamin D level mildly low at 22.3 and was to start supplement, has not started as of yet.    She reports no ADR with new medication and overall feeling better. Hypertension status: stable  Satisfied with current treatment? yes Duration of hypertension: chronic BP monitoring frequency:  daily BP range: 140-150/80's BP medication side effects:  no Medication compliance: good compliance Previous BP meds: Losartan Aspirin: no Recurrent headaches: no Visual changes: no Palpitations: no Dyspnea: no Chest pain: no Lower extremity edema: yes -- has decreased Dizzy/lightheaded: no  The 10-year ASCVD risk score Mikey Bussing DC Jr., et al., 2013) is: 9%   Values used to calculate the score:     Age: 55 years     Sex: Female     Is Non-Hispanic African American: No     Diabetic: No     Tobacco smoker: No     Systolic Blood Pressure: 119 mmHg     Is BP treated: Yes     HDL Cholesterol: 49 mg/dL     Total Cholesterol: 179 mg/dL   Relevant past medical, surgical, family and social history reviewed and updated as indicated. Interim medical history since our last visit reviewed. Allergies and medications reviewed and updated.  Review of Systems  Constitutional: Negative for activity change, appetite change, diaphoresis, fatigue and fever.  Respiratory: Negative for cough, chest tightness and shortness of breath.   Cardiovascular: Negative for chest pain, palpitations  and leg swelling.  Gastrointestinal: Negative.   Neurological: Negative.   Psychiatric/Behavioral: Negative.     Per HPI unless specifically indicated above     Objective:    BP (!) 142/84 (BP Location: Left Arm)   Pulse 65   Temp 97.8 F (36.6 C) (Oral)   Ht 5\' 4"  (1.626 m)   Wt (!) 306 lb (138.8 kg)   LMP  (LMP Unknown)   SpO2 96%   BMI 52.52 kg/m   Wt Readings from Last 3 Encounters:  07/05/19 (!) 306 lb (138.8 kg)  05/22/19 (!) 305 lb 3.2 oz (138.4 kg)  04/16/19 (!) 301 lb (136.5 kg)    Physical Exam Vitals and nursing note reviewed.  Constitutional:      General: She is awake. She is not in acute distress.    Appearance: She is well-developed and well-groomed. She is morbidly obese. She is not ill-appearing.  HENT:     Head: Normocephalic.     Right Ear: Hearing normal.     Left Ear: Hearing normal.  Eyes:     General: Lids are normal.        Right eye: No discharge.        Left eye: No discharge.     Conjunctiva/sclera: Conjunctivae normal.     Pupils: Pupils are equal, round, and reactive to  light.  Neck:     Vascular: No carotid bruit.  Cardiovascular:     Rate and Rhythm: Normal rate and regular rhythm.     Heart sounds: Normal heart sounds. No murmur. No gallop.   Pulmonary:     Effort: Pulmonary effort is normal. No accessory muscle usage or respiratory distress.     Breath sounds: Normal breath sounds.  Abdominal:     General: Bowel sounds are normal.     Palpations: Abdomen is soft.  Musculoskeletal:     Cervical back: Normal range of motion and neck supple.     Right lower leg: No edema.     Left lower leg: No edema.  Skin:    General: Skin is warm and dry.  Neurological:     Mental Status: She is alert and oriented to person, place, and time.  Psychiatric:        Attention and Perception: Attention normal.        Mood and Affect: Mood normal.        Speech: Speech normal.        Behavior: Behavior normal. Behavior is cooperative.         Thought Content: Thought content normal.    Results for orders placed or performed in visit on 05/22/19  CBC with Differential/Platelet  Result Value Ref Range   WBC 6.6 3.4 - 10.8 x10E3/uL   RBC 4.73 3.77 - 5.28 x10E6/uL   Hemoglobin 13.6 11.1 - 15.9 g/dL   Hematocrit 40.4 34.0 - 46.6 %   MCV 85 79 - 97 fL   MCH 28.8 26.6 - 33.0 pg   MCHC 33.7 31.5 - 35.7 g/dL   RDW 12.7 11.7 - 15.4 %   Platelets 192 150 - 450 x10E3/uL   Neutrophils 67 Not Estab. %   Lymphs 20 Not Estab. %   Monocytes 8 Not Estab. %   Eos 3 Not Estab. %   Basos 1 Not Estab. %   Neutrophils Absolute 4.5 1.4 - 7.0 x10E3/uL   Lymphocytes Absolute 1.4 0.7 - 3.1 x10E3/uL   Monocytes Absolute 0.5 0.1 - 0.9 x10E3/uL   EOS (ABSOLUTE) 0.2 0.0 - 0.4 x10E3/uL   Basophils Absolute 0.0 0.0 - 0.2 x10E3/uL   Immature Granulocytes 1 Not Estab. %   Immature Grans (Abs) 0.0 0.0 - 0.1 x10E3/uL  Comprehensive metabolic panel  Result Value Ref Range   Glucose 92 65 - 99 mg/dL   BUN 18 8 - 27 mg/dL   Creatinine, Ser 0.69 0.57 - 1.00 mg/dL   GFR calc non Af Amer 92 >59 mL/min/1.73   GFR calc Af Amer 106 >59 mL/min/1.73   BUN/Creatinine Ratio 26 12 - 28   Sodium 140 134 - 144 mmol/L   Potassium 3.6 3.5 - 5.2 mmol/L   Chloride 101 96 - 106 mmol/L   CO2 26 20 - 29 mmol/L   Calcium 9.2 8.7 - 10.3 mg/dL   Total Protein 6.6 6.0 - 8.5 g/dL   Albumin 4.3 3.8 - 4.8 g/dL   Globulin, Total 2.3 1.5 - 4.5 g/dL   Albumin/Globulin Ratio 1.9 1.2 - 2.2   Bilirubin Total 0.6 0.0 - 1.2 mg/dL   Alkaline Phosphatase 97 39 - 117 IU/L   AST 16 0 - 40 IU/L   ALT 11 0 - 32 IU/L  Lipid Panel w/o Chol/HDL Ratio  Result Value Ref Range   Cholesterol, Total 179 100 - 199 mg/dL   Triglycerides 140 0 - 149 mg/dL  HDL 49 >39 mg/dL   VLDL Cholesterol Cal 25 5 - 40 mg/dL   LDL Chol Calc (NIH) 105 (H) 0 - 99 mg/dL  VITAMIN D 25 Hydroxy (Vit-D Deficiency, Fractures)  Result Value Ref Range   Vit D, 25-Hydroxy 22.3 (L) 30.0 - 100.0 ng/mL  TSH    Result Value Ref Range   TSH 1.760 0.450 - 4.500 uIU/mL      Assessment & Plan:   Problem List Items Addressed This Visit      Cardiovascular and Mediastinum   Essential hypertension    Ongoing with BP remaining elevated above goal in office and on home readings.  Recommend she continue to monitor BP at home regularly and document.  Will increase Losartan to 100 MG daily (recent K+ 3.6), script sent, recommend she complete her 50 MG prescription by taking 2 tablets a day and then pick up 100 MG tablets.  BMP today.  Return in 6 weeks for follow-up.      Relevant Medications   losartan (COZAAR) 100 MG tablet   Other Relevant Orders   Basic metabolic panel     Other   BMI 50.0-59.9, adult ([facility]) - Primary    Recommended eating smaller high protein, low fat meals more frequently and exercising 30 mins a day 5 times a week with a goal of 10-15lb weight loss in the next 3 months. Patient voiced their understanding and motivation to adhere to these recommendations.       Elevated LDL cholesterol level    Noted on recent labs at 105.  ASCVD 9%.  Recommend heavy focus on diet changes and gradual weight loss.  Recheck in 6 months.      Vitamin D deficiency    Noted on recent labs 22.3.  Recommend she start supplement Vitamin D3 1000 units daily for bone health.          Follow up plan: Return in about 6 weeks (around 08/16/2019) for HTN.

## 2019-07-05 NOTE — Assessment & Plan Note (Signed)
Noted on recent labs at 70.  ASCVD 9%.  Recommend heavy focus on diet changes and gradual weight loss.  Recheck in 6 months.

## 2019-07-05 NOTE — Assessment & Plan Note (Signed)
Recommended eating smaller high protein, low fat meals more frequently and exercising 30 mins a day 5 times a week with a goal of 10-15lb weight loss in the next 3 months. Patient voiced their understanding and motivation to adhere to these recommendations.  

## 2019-07-05 NOTE — Patient Instructions (Signed)
Preventing High Cholesterol Cholesterol is a white, waxy substance similar to fat that the human body needs to help build cells. The liver makes all the cholesterol that a person's body needs. Having high cholesterol (hypercholesterolemia) increases a person's risk for heart disease and stroke. Extra (excess) cholesterol comes from the food the person eats. High cholesterol can often be prevented with diet and lifestyle changes. If you already have high cholesterol, you can control it with diet and lifestyle changes and with medicine. How can high cholesterol affect me? If you have high cholesterol, deposits (plaques) may build up on the walls of your arteries. The arteries are the blood vessels that carry blood away from your heart. Plaques make the arteries narrower and stiffer. This can limit or block blood flow and cause blood clots to form. Blood clots:  Are tiny balls of cells that form in your blood.  Can move to the heart or brain, causing a heart attack or stroke. Plaques in arteries greatly increase your risk for heart attack and stroke.Making diet and lifestyle changes can reduce your risk for these conditions that may threaten your life. What can increase my risk? This condition is more likely to develop in people who:  Eat foods that are high in saturated fat or cholesterol. Saturated fat is mostly found in: ? Foods that contain animal fat, such as red meat and some dairy products. ? Certain fatty foods made from plants, such as tropical oils.  Are overweight.  Are not getting enough exercise.  Have a family history of high cholesterol. What actions can I take to prevent this? Nutrition   Eat less saturated fat.  Avoid trans fats (partially hydrogenated oils). These are often found in margarine and in some baked goods, fried foods, and snacks bought in packages.  Avoid precooked or cured meat, such as sausages or meat loaves.  Avoid foods and drinks that have added  sugars.  Eat more fruits, vegetables, and whole grains.  Choose healthy sources of protein, such as fish, poultry, lean cuts of red meat, beans, peas, lentils, and nuts.  Choose healthy sources of fat, such as: ? Nuts. ? Vegetable oils, especially olive oil. ? Fish that have healthy fats (omega-3 fatty acids), such as mackerel or salmon. The items listed above may not be a complete list of recommended foods and beverages. Contact a dietitian for more information. Lifestyle  Lose weight if you are overweight. Losing 5-10 lb (2.3-4.5 kg) can help prevent or control high cholesterol. It can also lower your risk for diabetes and high blood pressure. Ask your health care provider to help you with a diet and exercise plan to lose weight safely.  Do not use any products that contain nicotine or tobacco, such as cigarettes, e-cigarettes, and chewing tobacco. If you need help quitting, ask your health care provider.  Limit your alcohol intake. ? Do not drink alcohol if:  Your health care provider tells you not to drink.  You are pregnant, may be pregnant, or are planning to become pregnant. ? If you drink alcohol:  Limit how much you use to:  0-1 drink a day for women.  0-2 drinks a day for men.  Be aware of how much alcohol is in your drink. In the [place] one drink equals one 12 oz bottle of beer (355 mL), one 5 oz glass of wine (148 mL), or one 1 oz glass of hard liquor (44 mL). Activity   Get enough exercise. Each week, do at   least 150 minutes of exercise that takes a medium level of effort (moderate-intensity exercise). ? This is exercise that:  Makes your heart beat faster and makes you breathe harder than usual.  Allows you to still be able to talk. ? You could exercise in short sessions several times a day or longer sessions a few times a week. For example, on 5 days each week, you could walk fast or ride your bike 3 times a day for 10 minutes each time.  Do exercises as told  by your health care provider. Medicines  In addition to diet and lifestyle changes, your health care provider may recommend medicines to help lower cholesterol. This may be a medicine to lower the amount of cholesterol your liver makes. You may need medicine if: ? Diet and lifestyle changes do not lower your cholesterol enough. ? You have high cholesterol and other risk factors for heart disease or stroke.  Take over-the-counter and prescription medicines only as told by your health care provider. General information  Manage your risk factors for high cholesterol. Talk with your health care provider about all your risk factors and how to lower your risk.  Manage other conditions that you have, such as diabetes or high blood pressure (hypertension).  Have blood tests to check your cholesterol levels at regular points in time as told by your health care provider.  Keep all follow-up visits as told by your health care provider. This is important. Where to find more information  American Heart Association: www.heart.org  National Heart, Lung, and Blood Institute: www.nhlbi.nih.gov Summary  High cholesterol increases your risk for heart disease and stroke. By keeping your cholesterol level low, you can reduce your risk for these conditions.  High cholesterol can often be prevented with diet and lifestyle changes.  Work with your health care provider to manage your risk factors, and have your blood tested regularly. This information is not intended to replace advice given to you by your health care provider. Make sure you discuss any questions you have with your health care provider. Document Revised: 05/11/2018 Document Reviewed: 09/26/2015 Elsevier Patient Education  2020 Elsevier Inc.  

## 2019-07-06 LAB — BASIC METABOLIC PANEL
BUN/Creatinine Ratio: 23 (ref 12–28)
BUN: 15 mg/dL (ref 8–27)
CO2: 26 mmol/L (ref 20–29)
Calcium: 9 mg/dL (ref 8.7–10.3)
Chloride: 103 mmol/L (ref 96–106)
Creatinine, Ser: 0.65 mg/dL (ref 0.57–1.00)
GFR calc Af Amer: 108 mL/min/{1.73_m2} (ref >59)
GFR calc non Af Amer: 93 mL/min/{1.73_m2} (ref >59)
Glucose: 100 mg/dL — ABNORMAL HIGH (ref 65–99)
Potassium: 3.9 mmol/L (ref 3.5–5.2)
Sodium: 141 mmol/L (ref 134–144)

## 2019-07-07 NOTE — Progress Notes (Signed)
Good morning.  Please let Jasmine Webb know her kidney function came back and remains stable + potassium level stable.  Continue with increase to 100 MG Losartan and will see her in 6 weeks.  She is doing great!!  Thank you.

## 2019-08-09 ENCOUNTER — Encounter: Payer: Self-pay | Admitting: Nurse Practitioner

## 2019-08-16 ENCOUNTER — Encounter: Payer: Self-pay | Admitting: Nurse Practitioner

## 2019-08-16 ENCOUNTER — Other Ambulatory Visit: Payer: Self-pay

## 2019-08-16 ENCOUNTER — Ambulatory Visit (INDEPENDENT_AMBULATORY_CARE_PROVIDER_SITE_OTHER): Payer: Medicare Other | Admitting: Nurse Practitioner

## 2019-08-16 DIAGNOSIS — E7800 Pure hypercholesterolemia, unspecified: Secondary | ICD-10-CM | POA: Diagnosis not present

## 2019-08-16 DIAGNOSIS — I10 Essential (primary) hypertension: Secondary | ICD-10-CM | POA: Diagnosis not present

## 2019-08-16 DIAGNOSIS — E559 Vitamin D deficiency, unspecified: Secondary | ICD-10-CM

## 2019-08-16 DIAGNOSIS — Z6841 Body Mass Index (BMI) 40.0 and over, adult: Secondary | ICD-10-CM | POA: Diagnosis not present

## 2019-08-16 NOTE — Assessment & Plan Note (Signed)
Ongoing with BP remaining elevated above goal in office on initial, but repeat closer to goal <130/80.  Recommend she monitor BP at home regularly and document.  Will continue Losartan 100 MG daily (recent K+ 3.6).  BMP today.  Return in October for follow-up.  Will consider addition of low dose HCTZ if ongoing elevations noted, would avoid Amlodipine due to baseline edema to BLE (suspect some lymphedema due to weight).

## 2019-08-16 NOTE — Assessment & Plan Note (Signed)
BMI 52.18 with HTN.  Recommended eating smaller high protein, low fat meals more frequently and exercising 30 mins a day 5 times a week with a goal of 10-15lb weight loss in the next 3 months. Patient voiced their understanding and motivation to adhere to these recommendations.

## 2019-08-16 NOTE — Patient Instructions (Signed)
DASH Eating Plan DASH stands for "Dietary Approaches to Stop Hypertension." The DASH eating plan is a healthy eating plan that has been shown to reduce high blood pressure (hypertension). It may also reduce your risk for type 2 diabetes, heart disease, and stroke. The DASH eating plan may also help with weight loss. What are tips for following this plan?  General guidelines  Avoid eating more than 2,300 mg (milligrams) of salt (sodium) a day. If you have hypertension, you may need to reduce your sodium intake to 1,500 mg a day.  Limit alcohol intake to no more than 1 drink a day for nonpregnant women and 2 drinks a day for men. One drink equals 12 oz of beer, 5 oz of wine, or 1 oz of hard liquor.  Work with your health care provider to maintain a healthy body weight or to lose weight. Ask what an ideal weight is for you.  Get at least 30 minutes of exercise that causes your heart to beat faster (aerobic exercise) most days of the week. Activities may include walking, swimming, or biking.  Work with your health care provider or diet and nutrition specialist (dietitian) to adjust your eating plan to your individual calorie needs. Reading food labels   Check food labels for the amount of sodium per serving. Choose foods with less than 5 percent of the Daily Value of sodium. Generally, foods with less than 300 mg of sodium per serving fit into this eating plan.  To find whole grains, look for the word "whole" as the first word in the ingredient list. Shopping  Buy products labeled as "low-sodium" or "no salt added."  Buy fresh foods. Avoid canned foods and premade or frozen meals. Cooking  Avoid adding salt when cooking. Use salt-free seasonings or herbs instead of table salt or sea salt. Check with your health care provider or pharmacist before using salt substitutes.  Do not fry foods. Cook foods using healthy methods such as baking, boiling, grilling, and broiling instead.  Cook with  heart-healthy oils, such as olive, canola, soybean, or sunflower oil. Meal planning  Eat a balanced diet that includes: ? 5 or more servings of fruits and vegetables each day. At each meal, try to fill half of your plate with fruits and vegetables. ? Up to 6-8 servings of whole grains each day. ? Less than 6 oz of lean meat, poultry, or fish each day. A 3-oz serving of meat is about the same size as a deck of cards. One egg equals 1 oz. ? 2 servings of low-fat dairy each day. ? A serving of nuts, seeds, or beans 5 times each week. ? Heart-healthy fats. Healthy fats called Omega-3 fatty acids are found in foods such as flaxseeds and coldwater fish, like sardines, salmon, and mackerel.  Limit how much you eat of the following: ? Canned or prepackaged foods. ? Food that is high in trans fat, such as fried foods. ? Food that is high in saturated fat, such as fatty meat. ? Sweets, desserts, sugary drinks, and other foods with added sugar. ? Full-fat dairy products.  Do not salt foods before eating.  Try to eat at least 2 vegetarian meals each week.  Eat more home-cooked food and less restaurant, buffet, and fast food.  When eating at a restaurant, ask that your food be prepared with less salt or no salt, if possible. What foods are recommended? The items listed may not be a complete list. Talk with your dietitian about   what dietary choices are best for you. Grains Whole-grain or whole-wheat bread. Whole-grain or whole-wheat pasta. Brown rice. Oatmeal. Quinoa. Bulgur. Whole-grain and low-sodium cereals. Pita bread. Low-fat, low-sodium crackers. Whole-wheat flour tortillas. Vegetables Fresh or frozen vegetables (raw, steamed, roasted, or grilled). Low-sodium or reduced-sodium tomato and vegetable juice. Low-sodium or reduced-sodium tomato sauce and tomato paste. Low-sodium or reduced-sodium canned vegetables. Fruits All fresh, dried, or frozen fruit. Canned fruit in natural juice (without  added sugar). Meat and other protein foods Skinless chicken or turkey. Ground chicken or turkey. Pork with fat trimmed off. Fish and seafood. Egg whites. Dried beans, peas, or lentils. Unsalted nuts, nut butters, and seeds. Unsalted canned beans. Lean cuts of beef with fat trimmed off. Low-sodium, lean deli meat. Dairy Low-fat (1%) or fat-free (skim) milk. Fat-free, low-fat, or reduced-fat cheeses. Nonfat, low-sodium ricotta or cottage cheese. Low-fat or nonfat yogurt. Low-fat, low-sodium cheese. Fats and oils Soft margarine without trans fats. Vegetable oil. Low-fat, reduced-fat, or light mayonnaise and salad dressings (reduced-sodium). Canola, safflower, olive, soybean, and sunflower oils. Avocado. Seasoning and other foods Herbs. Spices. Seasoning mixes without salt. Unsalted popcorn and pretzels. Fat-free sweets. What foods are not recommended? The items listed may not be a complete list. Talk with your dietitian about what dietary choices are best for you. Grains Baked goods made with fat, such as croissants, muffins, or some breads. Dry pasta or rice meal packs. Vegetables Creamed or fried vegetables. Vegetables in a cheese sauce. Regular canned vegetables (not low-sodium or reduced-sodium). Regular canned tomato sauce and paste (not low-sodium or reduced-sodium). Regular tomato and vegetable juice (not low-sodium or reduced-sodium). Pickles. Olives. Fruits Canned fruit in a light or heavy syrup. Fried fruit. Fruit in cream or butter sauce. Meat and other protein foods Fatty cuts of meat. Ribs. Fried meat. Bacon. Sausage. Bologna and other processed lunch meats. Salami. Fatback. Hotdogs. Bratwurst. Salted nuts and seeds. Canned beans with added salt. Canned or smoked fish. Whole eggs or egg yolks. Chicken or turkey with skin. Dairy Whole or 2% milk, cream, and half-and-half. Whole or full-fat cream cheese. Whole-fat or sweetened yogurt. Full-fat cheese. Nondairy creamers. Whipped toppings.  Processed cheese and cheese spreads. Fats and oils Butter. Stick margarine. Lard. Shortening. Ghee. Bacon fat. Tropical oils, such as coconut, palm kernel, or palm oil. Seasoning and other foods Salted popcorn and pretzels. Onion salt, garlic salt, seasoned salt, table salt, and sea salt. Worcestershire sauce. Tartar sauce. Barbecue sauce. Teriyaki sauce. Soy sauce, including reduced-sodium. Steak sauce. Canned and packaged gravies. Fish sauce. Oyster sauce. Cocktail sauce. Horseradish that you find on the shelf. Ketchup. Mustard. Meat flavorings and tenderizers. Bouillon cubes. Hot sauce and Tabasco sauce. Premade or packaged marinades. Premade or packaged taco seasonings. Relishes. Regular salad dressings. Where to find more information:  National Heart, Lung, and Blood Institute: www.nhlbi.nih.gov  American Heart Association: www.heart.org Summary  The DASH eating plan is a healthy eating plan that has been shown to reduce high blood pressure (hypertension). It may also reduce your risk for type 2 diabetes, heart disease, and stroke.  With the DASH eating plan, you should limit salt (sodium) intake to 2,300 mg a day. If you have hypertension, you may need to reduce your sodium intake to 1,500 mg a day.  When on the DASH eating plan, aim to eat more fresh fruits and vegetables, whole grains, lean proteins, low-fat dairy, and heart-healthy fats.  Work with your health care provider or diet and nutrition specialist (dietitian) to adjust your eating plan to your   individual calorie needs. This information is not intended to replace advice given to you by your health care provider. Make sure you discuss any questions you have with your health care provider. Document Revised: 12/30/2016 Document Reviewed: 01/11/2016 Elsevier Patient Education  2020 [employer]  

## 2019-08-16 NOTE — Assessment & Plan Note (Signed)
Recommended eating smaller high protein, low fat meals more frequently and exercising 30 mins a day 5 times a week with a goal of 10-15lb weight loss in the next 3 months. Patient voiced their understanding and motivation to adhere to these recommendations.  

## 2019-08-16 NOTE — Progress Notes (Signed)
BP 138/86 (BP Location: Left Arm)   Pulse 68   Temp 98.1 F (36.7 C) (Oral)   Wt (!) 304 lb (137.9 kg)   LMP  (LMP Unknown)   SpO2 97%   BMI 52.18 kg/m    Subjective:    Patient ID: [name], female    DOB: 09-Feb-1955, 64 y.o.   MRN: [id number]  HPI: Jasmine Webb is a 64 y.o. female  Chief Complaint  Patient presents with  . Hypertension   HYPERTENSION Increased Losartan on 07/05/19 to 100 MG daily, some days feels exhausted at end of day. At annual physical noted LDL slightly elevated at 105.  Vitamin D level mildly low at 22.3 and was to start supplement, has started taking daily.  Hypertension status: stable  Satisfied with current treatment? yes Duration of hypertension: chronic BP monitoring frequency: not checking BP range: none BP medication side effects:  no Medication compliance: good compliance Previous BP meds: Losartan Aspirin: no Recurrent headaches: no Visual changes: no Palpitations: no Dyspnea: no Chest pain: no Lower extremity edema: yes -- has decreased Dizzy/lightheaded: no  The 10-year ASCVD risk score Mikey Bussing DC Jr., et al., 2013) is: 8.5%   Values used to calculate the score:     Age: 26 years     Sex: Female     Is Non-Hispanic African American: No     Diabetic: No     Tobacco smoker: No     Systolic Blood Pressure: 937 mmHg     Is BP treated: Yes     HDL Cholesterol: 49 mg/dL     Total Cholesterol: 179 mg/dL  Relevant past medical, surgical, family and social history reviewed and updated as indicated. Interim medical history since our last visit reviewed. Allergies and medications reviewed and updated.  Review of Systems  Constitutional: Negative for activity change, appetite change, diaphoresis, fatigue and fever.  Respiratory: Negative for cough, chest tightness and shortness of breath.   Cardiovascular: Negative for chest pain, palpitations and leg swelling.  Gastrointestinal: Negative.   Neurological: Negative.     Psychiatric/Behavioral: Negative.     Per HPI unless specifically indicated above     Objective:    BP 138/86 (BP Location: Left Arm)   Pulse 68   Temp 98.1 F (36.7 C) (Oral)   Wt (!) 304 lb (137.9 kg)   LMP  (LMP Unknown)   SpO2 97%   BMI 52.18 kg/m   Wt Readings from Last 3 Encounters:  08/16/19 (!) 304 lb (137.9 kg)  07/05/19 (!) 306 lb (138.8 kg)  05/22/19 (!) 305 lb 3.2 oz (138.4 kg)    Physical Exam Vitals and nursing note reviewed.  Constitutional:      General: She is awake. She is not in acute distress.    Appearance: She is well-developed and well-groomed. She is morbidly obese. She is not ill-appearing.  HENT:     Head: Normocephalic.     Right Ear: Hearing normal.     Left Ear: Hearing normal.  Eyes:     General: Lids are normal.        Right eye: No discharge.        Left eye: No discharge.     Conjunctiva/sclera: Conjunctivae normal.     Pupils: Pupils are equal, round, and reactive to light.  Neck:     Vascular: No carotid bruit.  Cardiovascular:     Rate and Rhythm: Normal rate and regular rhythm.     Heart sounds:  Normal heart sounds. No murmur heard.  No gallop.      Comments: Baseline edema BLE Pulmonary:     Effort: Pulmonary effort is normal. No accessory muscle usage or respiratory distress.     Breath sounds: Normal breath sounds.  Abdominal:     General: Bowel sounds are normal.     Palpations: Abdomen is soft.  Musculoskeletal:     Cervical back: Normal range of motion and neck supple.     Right lower leg: 2+ Edema present.     Left lower leg: 2+ Edema present.  Skin:    General: Skin is warm and dry.  Neurological:     Mental Status: She is alert and oriented to person, place, and time.  Psychiatric:        Attention and Perception: Attention normal.        Mood and Affect: Mood normal.        Speech: Speech normal.        Behavior: Behavior normal. Behavior is cooperative.        Thought Content: Thought content normal.      Results for orders placed or performed in visit on 96/78/93  Basic metabolic panel  Result Value Ref Range   Glucose 100 (H) 65 - 99 mg/dL   BUN 15 8 - 27 mg/dL   Creatinine, Ser 0.65 0.57 - 1.00 mg/dL   GFR calc non Af Amer 93 >59 mL/min/1.73   GFR calc Af Amer 108 >59 mL/min/1.73   BUN/Creatinine Ratio 23 12 - 28   Sodium 141 134 - 144 mmol/L   Potassium 3.9 3.5 - 5.2 mmol/L   Chloride 103 96 - 106 mmol/L   CO2 26 20 - 29 mmol/L   Calcium 9.0 8.7 - 10.3 mg/dL      Assessment & Plan:   Problem List Items Addressed This Visit      Cardiovascular and Mediastinum   Essential hypertension    Ongoing with BP remaining elevated above goal in office on initial, but repeat closer to goal <130/80.  Recommend she monitor BP at home regularly and document.  Will continue Losartan 100 MG daily (recent K+ 3.6).  BMP today.  Return in October for follow-up.  Will consider addition of low dose HCTZ if ongoing elevations noted, would avoid Amlodipine due to baseline edema to BLE (suspect some lymphedema due to weight).      Relevant Orders   Basic metabolic panel     Other   BMI 50.0-59.9, adult ([facility])    Recommended eating smaller high protein, low fat meals more frequently and exercising 30 mins a day 5 times a week with a goal of 10-15lb weight loss in the next 3 months. Patient voiced their understanding and motivation to adhere to these recommendations.       Elevated LDL cholesterol level    Noted on recent labs at 105.  ASCVD 8.5%.  Recommend heavy focus on diet changes and gradual weight loss.  Recheck in October.      Vitamin D deficiency    Noted on recent labs 22.3.  Recommend she continue supplement Vitamin D3 1000 units daily for bone health.  Recheck level today.      Relevant Orders   VITAMIN D 25 Hydroxy (Vit-D Deficiency, Fractures)   Morbid obesity due to excess calories ([facility]) - Primary    BMI 52.18 with HTN.  Recommended eating smaller high protein, low fat  meals more frequently and exercising 30 mins a day  5 times a week with a goal of 10-15lb weight loss in the next 3 months. Patient voiced their understanding and motivation to adhere to these recommendations.           Follow up plan: Return in about 3 months (around 11/16/2019) for HTN/HLD.

## 2019-08-16 NOTE — Assessment & Plan Note (Signed)
Noted on recent labs at 63.  ASCVD 8.5%.  Recommend heavy focus on diet changes and gradual weight loss.  Recheck in October.

## 2019-08-16 NOTE — Assessment & Plan Note (Signed)
Noted on recent labs 22.3.  Recommend she continue supplement Vitamin D3 1000 units daily for bone health.  Recheck level today.

## 2019-08-17 LAB — BASIC METABOLIC PANEL
BUN/Creatinine Ratio: 22 (ref 12–28)
BUN: 16 mg/dL (ref 8–27)
CO2: 26 mmol/L (ref 20–29)
Calcium: 9 mg/dL (ref 8.7–10.3)
Chloride: 104 mmol/L (ref 96–106)
Creatinine, Ser: 0.73 mg/dL (ref 0.57–1.00)
GFR calc Af Amer: 100 mL/min/{1.73_m2} (ref >59)
GFR calc non Af Amer: 87 mL/min/{1.73_m2} (ref >59)
Glucose: 99 mg/dL (ref 65–99)
Potassium: 3.9 mmol/L (ref 3.5–5.2)
Sodium: 144 mmol/L (ref 134–144)

## 2019-08-17 LAB — VITAMIN D 25 HYDROXY (VIT D DEFICIENCY, FRACTURES): Vit D, 25-Hydroxy: 31.2 ng/mL (ref 30.0–100.0)

## 2019-08-18 NOTE — Progress Notes (Signed)
Please let Collyns know her labs have returned and kidney function remains stable.  Vitamin D level is improving, continues daily supplement.  If any questions please let me know.  Have a great day!!

## 2019-08-19 ENCOUNTER — Telehealth: Payer: Self-pay | Admitting: Nurse Practitioner

## 2019-08-19 NOTE — Telephone Encounter (Signed)
Copied from [place] [id number]. Topic: General - Other >> Aug 19, 2019  9:07 AM Greggory Keen D wrote: Reason for CRM: Pt called saying she was calling in regards to her lab results  CB#  [phone]

## 2019-08-19 NOTE — Telephone Encounter (Signed)
Called and notified patient of lab results. See result note.

## 2019-10-09 ENCOUNTER — Ambulatory Visit: Payer: Self-pay

## 2019-10-09 ENCOUNTER — Encounter: Payer: Self-pay | Admitting: Emergency Medicine

## 2019-10-09 DIAGNOSIS — R509 Fever, unspecified: Secondary | ICD-10-CM | POA: Diagnosis not present

## 2019-10-09 DIAGNOSIS — I248 Other forms of acute ischemic heart disease: Secondary | ICD-10-CM | POA: Diagnosis present

## 2019-10-09 DIAGNOSIS — Z90710 Acquired absence of both cervix and uterus: Secondary | ICD-10-CM

## 2019-10-09 DIAGNOSIS — Z825 Family history of asthma and other chronic lower respiratory diseases: Secondary | ICD-10-CM | POA: Diagnosis not present

## 2019-10-09 DIAGNOSIS — Z886 Allergy status to analgesic agent status: Secondary | ICD-10-CM

## 2019-10-09 DIAGNOSIS — Z6841 Body Mass Index (BMI) 40.0 and over, adult: Secondary | ICD-10-CM

## 2019-10-09 DIAGNOSIS — Z8249 Family history of ischemic heart disease and other diseases of the circulatory system: Secondary | ICD-10-CM | POA: Diagnosis not present

## 2019-10-09 DIAGNOSIS — M1990 Unspecified osteoarthritis, unspecified site: Secondary | ICD-10-CM | POA: Diagnosis not present

## 2019-10-09 DIAGNOSIS — I119 Hypertensive heart disease without heart failure: Secondary | ICD-10-CM | POA: Diagnosis present

## 2019-10-09 DIAGNOSIS — U071 COVID-19: Secondary | ICD-10-CM | POA: Diagnosis not present

## 2019-10-09 DIAGNOSIS — J9601 Acute respiratory failure with hypoxia: Secondary | ICD-10-CM | POA: Diagnosis not present

## 2019-10-09 DIAGNOSIS — Z79899 Other long term (current) drug therapy: Secondary | ICD-10-CM | POA: Diagnosis not present

## 2019-10-09 DIAGNOSIS — R000 Tachycardia, unspecified: Secondary | ICD-10-CM | POA: Diagnosis not present

## 2019-10-09 DIAGNOSIS — I2602 Saddle embolus of pulmonary artery with acute cor pulmonale: Secondary | ICD-10-CM | POA: Diagnosis present

## 2019-10-09 DIAGNOSIS — J1282 Pneumonia due to coronavirus disease 2019: Secondary | ICD-10-CM | POA: Diagnosis not present

## 2019-10-09 DIAGNOSIS — R0602 Shortness of breath: Secondary | ICD-10-CM | POA: Diagnosis not present

## 2019-10-09 LAB — CBC
HCT: 39.5 % (ref 36.0–46.0)
Hemoglobin: 13.1 g/dL (ref 12.0–15.0)
MCH: 28.7 pg (ref 26.0–34.0)
MCHC: 33.2 g/dL (ref 30.0–36.0)
MCV: 86.4 fL (ref 80.0–100.0)
Platelets: 132 10*3/uL — ABNORMAL LOW (ref 150–400)
RBC: 4.57 MIL/uL (ref 3.87–5.11)
RDW: 13.2 % (ref 11.5–15.5)
WBC: 5.1 10*3/uL (ref 4.0–10.5)
nRBC: 0 % (ref 0.0–0.2)

## 2019-10-09 NOTE — Telephone Encounter (Signed)
Lvm asking her to let us know of results.

## 2019-10-09 NOTE — Telephone Encounter (Signed)
Pt. Reports she was exposed to COVID 19 last week from her grown children. Started feeling bad last week. Has cough, shortness of breath, fatigue, headache, wheezing. No availability today. Pt. States "I can't wait until tomorrow." Going to [facility] today.  Reason for Disposition . [1] Fever > 100.0 F (37.8 C) AND [2] bedridden (e.g., nursing home patient, CVA, chronic illness, recovering from surgery)  Answer Assessment - Initial Assessment Questions 1. COVID-19 DIAGNOSIS: "Who made your Coronavirus (COVID-19) diagnosis?" "Was it confirmed by a positive lab test?" If not diagnosed by a HCP, ask "Are there lots of cases (community spread) where you live?" (See public health department website, if unsure)     No 2. COVID-19 EXPOSURE: "Was there any known exposure to [place] before the symptoms began?" CDC Definition of close contact: within 6 feet (2 meters) for a total of 15 minutes or more over a 24-hour period.      Last week 3. ONSET: "When did the COVID-19 symptoms start?"      Last week 4. WORST SYMPTOM: "What is your worst symptom?" (e.g., cough, fever, shortness of breath, muscle aches)     Shortness of breath 5. COUGH: "Do you have a cough?" If Yes, ask: "How bad is the cough?"       Yes 6. FEVER: "Do you have a fever?" If Yes, ask: "What is your temperature, how was it measured, and when did it start?"     Low grade 7. RESPIRATORY STATUS: "Describe your breathing?" (e.g., shortness of breath, wheezing, unable to speak)      Wheezing 8. BETTER-SAME-WORSE: "Are you getting better, staying the same or getting worse compared to yesterday?"  If getting worse, ask, "In what way?"     Worse 9. HIGH RISK DISEASE: "Do you have any chronic medical problems?" (e.g., asthma, heart or lung disease, weak immune system, obesity, etc.)     HTN 10. PREGNANCY: "Is there any chance you are pregnant?" "When was your last menstrual period?"       No 11. OTHER SYMPTOMS: "Do you have any other symptoms?"  (e.g.,  chills, fatigue, headache, loss of smell or taste, muscle pain, sore throat; new loss of smell or taste especially support the diagnosis of COVID-19)       Headache, fatigue, body aches,shortness of breath  Protocols used: CORONAVIRUS (COVID-19) DIAGNOSED OR SUSPECTED-A-AH

## 2019-10-09 NOTE — Telephone Encounter (Signed)
Please tell her to alert provider to Covid results as soon as she knows.

## 2019-10-09 NOTE — ED Triage Notes (Signed)
Pt reports SOB when laying down tonight as well as cough, nasal congestion and fever x7 days. Pt with small children in home who are currently sick.

## 2019-10-09 NOTE — ED Notes (Signed)
Pt placed on 2L O2 

## 2019-10-10 ENCOUNTER — Emergency Department: Payer: Medicare Other

## 2019-10-10 ENCOUNTER — Inpatient Hospital Stay
Admit: 2019-10-10 | Discharge: 2019-10-10 | Disposition: A | Discharge status: Home / Self Care | Payer: Medicare Other | Attending: Family Medicine | Admitting: Family Medicine

## 2019-10-10 ENCOUNTER — Other Ambulatory Visit: Payer: Self-pay

## 2019-10-10 ENCOUNTER — Inpatient Hospital Stay
Admission: EM | Admit: 2019-10-10 | Discharge: 2019-10-13 | DRG: 163 | Disposition: A | Discharge status: Home / Self Care | Payer: Medicare Other | Attending: Internal Medicine | Admitting: Internal Medicine

## 2019-10-10 ENCOUNTER — Inpatient Hospital Stay: Payer: Medicare Other

## 2019-10-10 ENCOUNTER — Encounter
Admission: EM | Disposition: A | Discharge status: Home / Self Care | Payer: Self-pay | Source: Home / Self Care | Attending: Internal Medicine

## 2019-10-10 ENCOUNTER — Other Ambulatory Visit (INDEPENDENT_AMBULATORY_CARE_PROVIDER_SITE_OTHER): Payer: Self-pay | Admitting: Vascular Surgery

## 2019-10-10 DIAGNOSIS — U071 COVID-19: Secondary | ICD-10-CM | POA: Diagnosis not present

## 2019-10-10 DIAGNOSIS — Z86711 Personal history of pulmonary embolism: Secondary | ICD-10-CM | POA: Diagnosis present

## 2019-10-10 DIAGNOSIS — Z79899 Other long term (current) drug therapy: Secondary | ICD-10-CM | POA: Diagnosis not present

## 2019-10-10 DIAGNOSIS — I2699 Other pulmonary embolism without acute cor pulmonale: Secondary | ICD-10-CM

## 2019-10-10 DIAGNOSIS — Z90710 Acquired absence of both cervix and uterus: Secondary | ICD-10-CM | POA: Diagnosis not present

## 2019-10-10 DIAGNOSIS — R0602 Shortness of breath: Secondary | ICD-10-CM | POA: Diagnosis not present

## 2019-10-10 DIAGNOSIS — J1282 Pneumonia due to coronavirus disease 2019: Secondary | ICD-10-CM | POA: Diagnosis not present

## 2019-10-10 DIAGNOSIS — I119 Hypertensive heart disease without heart failure: Secondary | ICD-10-CM | POA: Diagnosis present

## 2019-10-10 DIAGNOSIS — Z825 Family history of asthma and other chronic lower respiratory diseases: Secondary | ICD-10-CM | POA: Diagnosis not present

## 2019-10-10 DIAGNOSIS — I4891 Unspecified atrial fibrillation: Secondary | ICD-10-CM | POA: Diagnosis not present

## 2019-10-10 DIAGNOSIS — Z6841 Body Mass Index (BMI) 40.0 and over, adult: Secondary | ICD-10-CM | POA: Diagnosis not present

## 2019-10-10 DIAGNOSIS — I2789 Other specified pulmonary heart diseases: Secondary | ICD-10-CM | POA: Diagnosis not present

## 2019-10-10 DIAGNOSIS — Z886 Allergy status to analgesic agent status: Secondary | ICD-10-CM | POA: Diagnosis not present

## 2019-10-10 DIAGNOSIS — M1990 Unspecified osteoarthritis, unspecified site: Secondary | ICD-10-CM | POA: Diagnosis present

## 2019-10-10 DIAGNOSIS — Z8249 Family history of ischemic heart disease and other diseases of the circulatory system: Secondary | ICD-10-CM | POA: Diagnosis not present

## 2019-10-10 DIAGNOSIS — I10 Essential (primary) hypertension: Secondary | ICD-10-CM | POA: Diagnosis present

## 2019-10-10 DIAGNOSIS — J9601 Acute respiratory failure with hypoxia: Secondary | ICD-10-CM | POA: Diagnosis not present

## 2019-10-10 DIAGNOSIS — R7989 Other specified abnormal findings of blood chemistry: Secondary | ICD-10-CM | POA: Diagnosis present

## 2019-10-10 DIAGNOSIS — R778 Other specified abnormalities of plasma proteins: Secondary | ICD-10-CM | POA: Diagnosis present

## 2019-10-10 DIAGNOSIS — I248 Other forms of acute ischemic heart disease: Secondary | ICD-10-CM | POA: Diagnosis present

## 2019-10-10 DIAGNOSIS — Z8616 Personal history of COVID-19: Secondary | ICD-10-CM | POA: Diagnosis present

## 2019-10-10 DIAGNOSIS — I2602 Saddle embolus of pulmonary artery with acute cor pulmonale: Secondary | ICD-10-CM | POA: Diagnosis not present

## 2019-10-10 DIAGNOSIS — R509 Fever, unspecified: Secondary | ICD-10-CM | POA: Diagnosis not present

## 2019-10-10 HISTORY — PX: PULMONARY THROMBECTOMY: CATH118295

## 2019-10-10 LAB — BASIC METABOLIC PANEL
Anion gap: 11 (ref 5–15)
BUN: 15 mg/dL (ref 8–23)
CO2: 25 mmol/L (ref 22–32)
Calcium: 8.3 mg/dL — ABNORMAL LOW (ref 8.9–10.3)
Chloride: 104 mmol/L (ref 98–111)
Creatinine, Ser: 0.69 mg/dL (ref 0.44–1.00)
GFR calc Af Amer: >60 mL/min (ref >60)
GFR calc non Af Amer: >60 mL/min (ref >60)
Glucose, Bld: 125 mg/dL — ABNORMAL HIGH (ref 70–99)
Potassium: 3.8 mmol/L (ref 3.5–5.1)
Sodium: 140 mmol/L (ref 135–145)

## 2019-10-10 LAB — LIPID PANEL
Cholesterol: 139 mg/dL (ref 0–200)
HDL: 36 mg/dL — ABNORMAL LOW (ref >40)
LDL Cholesterol: 89 mg/dL (ref 0–99)
Total CHOL/HDL Ratio: 3.9 RATIO
Triglycerides: 71 mg/dL (ref <150)
VLDL: 14 mg/dL (ref 0–40)

## 2019-10-10 LAB — ECHOCARDIOGRAM COMPLETE
AR max vel: 2.48 cm2
AV Area VTI: 3.35 cm2
AV Area mean vel: 2.52 cm2
AV Mean grad: 7.3 mmHg
AV Peak grad: 11.6 mmHg
Ao pk vel: 1.7 m/s
Area-P 1/2: 3.77 cm2
S' Lateral: 2.69 cm
Weight: 4761.94 oz

## 2019-10-10 LAB — TROPONIN I (HIGH SENSITIVITY)
Troponin I (High Sensitivity): 657 ng/L (ref <18)
Troponin I (High Sensitivity): 557 ng/L (ref <18)
Troponin I (High Sensitivity): 423 ng/L (ref <18)
Troponin I (High Sensitivity): 334 ng/L (ref <18)

## 2019-10-10 LAB — PROTIME-INR
INR: 1 (ref 0.8–1.2)
Prothrombin Time: 13.1 seconds (ref 11.4–15.2)

## 2019-10-10 LAB — APTT: aPTT: 35 seconds (ref 24–36)

## 2019-10-10 LAB — HIV ANTIBODY (ROUTINE TESTING W REFLEX): HIV Screen 4th Generation wRfx: NONREACTIVE

## 2019-10-10 LAB — HEPARIN LEVEL (UNFRACTIONATED)
Heparin Unfractionated: 0.54 IU/mL (ref 0.30–0.70)
Heparin Unfractionated: 0.66 IU/mL (ref 0.30–0.70)

## 2019-10-10 LAB — FIBRINOGEN: Fibrinogen: 510 mg/dL — ABNORMAL HIGH (ref 210–475)

## 2019-10-10 LAB — FERRITIN: Ferritin: 251 ng/mL (ref 11–307)

## 2019-10-10 LAB — C-REACTIVE PROTEIN: CRP: 2.1 mg/dL — ABNORMAL HIGH (ref <1.0)

## 2019-10-10 LAB — PROCALCITONIN: Procalcitonin: <0.1 ng/mL

## 2019-10-10 LAB — LACTATE DEHYDROGENASE: LDH: 215 U/L — ABNORMAL HIGH (ref 98–192)

## 2019-10-10 LAB — BRAIN NATRIURETIC PEPTIDE: B Natriuretic Peptide: 66.1 pg/mL (ref 0.0–100.0)

## 2019-10-10 LAB — SARS CORONAVIRUS 2 BY RT PCR (HOSPITAL ORDER, PERFORMED IN ~~LOC~~ HOSPITAL LAB): SARS Coronavirus 2: POSITIVE — AB

## 2019-10-10 LAB — FIBRIN DERIVATIVES D-DIMER (ARMC ONLY): Fibrin derivatives D-dimer (ARMC): >7500 ng/mL (FEU) — ABNORMAL HIGH (ref 0.00–499.00)

## 2019-10-10 SURGERY — PULMONARY THROMBECTOMY
Anesthesia: Moderate Sedation

## 2019-10-10 MED ORDER — ACETAMINOPHEN 325 MG PO TABS: 650.0000 mg | ORAL_TABLET | Freq: Four times a day (QID) | ORAL | Status: DC | PRN

## 2019-10-10 MED ORDER — TRAZODONE HCL 50 MG PO TABS: 25.0000 mg | ORAL_TABLET | Freq: Every evening | ORAL | Status: DC | PRN

## 2019-10-10 MED ORDER — CEFAZOLIN SODIUM-DEXTROSE 2-4 GM/100ML-% IV SOLN: 2.0000 g | Freq: Once | INTRAVENOUS | Status: AC

## 2019-10-10 MED ORDER — DIPHENHYDRAMINE HCL 50 MG/ML IJ SOLN: 50.0000 mg | Freq: Once | INTRAMUSCULAR | Status: DC | PRN

## 2019-10-10 MED ORDER — VITAMIN D 25 MCG (1000 UNIT) PO TABS
1000.0000 [IU] | ORAL_TABLET | Freq: Every day | ORAL | Status: DC
  Administered 2019-10-10 – 2019-10-13 (×4): 1000 [IU] via ORAL
  Filled 2019-10-10 (×4): qty 1

## 2019-10-10 MED ORDER — MAGNESIUM HYDROXIDE 400 MG/5ML PO SUSP: 30.0000 mL | Freq: Every day | ORAL | Status: DC | PRN

## 2019-10-10 MED ORDER — ASCORBIC ACID 500 MG PO TABS
500.0000 mg | ORAL_TABLET | Freq: Every day | ORAL | Status: DC
  Administered 2019-10-10 – 2019-10-13 (×4): 500 mg via ORAL
  Filled 2019-10-10 (×4): qty 1

## 2019-10-10 MED ORDER — LOSARTAN POTASSIUM 50 MG PO TABS
100.0000 mg | ORAL_TABLET | Freq: Every day | ORAL | Status: DC
  Administered 2019-10-10 – 2019-10-13 (×4): 100 mg via ORAL
  Filled 2019-10-10 (×4): qty 2

## 2019-10-10 MED ORDER — HYDROCOD POLST-CPM POLST ER 10-8 MG/5ML PO SUER: 5.0000 mL | Freq: Two times a day (BID) | ORAL | Status: DC | PRN

## 2019-10-10 MED ORDER — METOPROLOL TARTRATE 25 MG PO TABS: 12.5000 mg | ORAL_TABLET | Freq: Two times a day (BID) | ORAL | Status: DC

## 2019-10-10 MED ORDER — METHYLPREDNISOLONE SODIUM SUCC 125 MG IJ SOLR
125.0000 mg | Freq: Two times a day (BID) | INTRAMUSCULAR | Status: AC
  Administered 2019-10-11 – 2019-10-12 (×4): 125 mg via INTRAVENOUS
  Filled 2019-10-10 (×4): qty 2

## 2019-10-10 MED ORDER — FAMOTIDINE 20 MG PO TABS: 40.0000 mg | ORAL_TABLET | Freq: Once | ORAL | Status: DC | PRN

## 2019-10-10 MED ORDER — ONDANSETRON HCL 4 MG PO TABS: 4.0000 mg | ORAL_TABLET | Freq: Four times a day (QID) | ORAL | Status: DC | PRN

## 2019-10-10 MED ORDER — GUAIFENESIN ER 600 MG PO TB12
600.0000 mg | ORAL_TABLET | Freq: Two times a day (BID) | ORAL | Status: DC
  Administered 2019-10-10 – 2019-10-13 (×7): 600 mg via ORAL
  Filled 2019-10-10 (×7): qty 1

## 2019-10-10 MED ORDER — FENTANYL CITRATE (PF) 100 MCG/2ML IJ SOLN
INTRAMUSCULAR | Status: DC | PRN
Start: 2019-10-10 — End: 2019-10-10
  Administered 2019-10-10: 50 ug via INTRAVENOUS

## 2019-10-10 MED ORDER — HEPARIN BOLUS VIA INFUSION
4000.0000 [IU] | Freq: Once | INTRAVENOUS | Status: AC
  Administered 2019-10-10: 4000 [IU] via INTRAVENOUS
  Filled 2019-10-10: qty 4000

## 2019-10-10 MED ORDER — LACTATED RINGERS IV BOLUS
1000.0000 mL | Freq: Once | INTRAVENOUS | Status: AC
  Administered 2019-10-10: 1000 mL via INTRAVENOUS

## 2019-10-10 MED ORDER — ONDANSETRON HCL 4 MG/2ML IJ SOLN: 4.0000 mg | Freq: Four times a day (QID) | INTRAMUSCULAR | Status: DC | PRN

## 2019-10-10 MED ORDER — LACTATED RINGERS IV SOLN: INTRAVENOUS | Status: DC

## 2019-10-10 MED ORDER — MORPHINE SULFATE (PF) 2 MG/ML IV SOLN: 2.0000 mg | INTRAVENOUS | Status: DC | PRN

## 2019-10-10 MED ORDER — GUAIFENESIN-DM 100-10 MG/5ML PO SYRP
10.0000 mL | ORAL_SOLUTION | ORAL | Status: DC | PRN
  Filled 2019-10-10: qty 10

## 2019-10-10 MED ORDER — SODIUM CHLORIDE 0.9 % IV SOLN: INTRAVENOUS | Status: DC

## 2019-10-10 MED ORDER — MIDAZOLAM HCL 2 MG/2ML IJ SOLN
INTRAMUSCULAR | Status: DC | PRN
  Administered 2019-10-10: 2 mg via INTRAVENOUS

## 2019-10-10 MED ORDER — SODIUM CHLORIDE 0.9 % IV SOLN: 100.0000 mg | Freq: Every day | INTRAVENOUS | Status: DC

## 2019-10-10 MED ORDER — FENTANYL CITRATE (PF) 100 MCG/2ML IJ SOLN
INTRAMUSCULAR | Status: AC
  Filled 2019-10-10: qty 2

## 2019-10-10 MED ORDER — METHYLPREDNISOLONE SODIUM SUCC 125 MG IJ SOLR: 125.0000 mg | Freq: Once | INTRAMUSCULAR | Status: DC | PRN

## 2019-10-10 MED ORDER — ASPIRIN EC 81 MG PO TBEC
81.0000 mg | DELAYED_RELEASE_TABLET | Freq: Every day | ORAL | Status: DC
  Administered 2019-10-10 – 2019-10-13 (×4): 81 mg via ORAL
  Filled 2019-10-10 (×4): qty 1

## 2019-10-10 MED ORDER — NITROGLYCERIN 0.4 MG SL SUBL: 0.4000 mg | SUBLINGUAL_TABLET | SUBLINGUAL | Status: DC | PRN

## 2019-10-10 MED ORDER — HYDROMORPHONE HCL 1 MG/ML IJ SOLN: 1.0000 mg | Freq: Once | INTRAMUSCULAR | Status: DC | PRN

## 2019-10-10 MED ORDER — VITAMIN D 25 MCG (1000 UNIT) PO TABS: 1000.0000 [IU] | ORAL_TABLET | Freq: Every day | ORAL | Status: DC

## 2019-10-10 MED ORDER — MIDAZOLAM HCL 2 MG/ML PO SYRP: 8.0000 mg | ORAL_SOLUTION | Freq: Once | ORAL | Status: DC | PRN

## 2019-10-10 MED ORDER — HEPARIN (PORCINE) 25000 UT/250ML-% IV SOLN
1200.0000 [IU]/h | INTRAVENOUS | Status: DC
  Administered 2019-10-10 (×2): 1200 [IU]/h via INTRAVENOUS
  Filled 2019-10-10 (×2): qty 250

## 2019-10-10 MED ORDER — FAMOTIDINE 20 MG PO TABS
20.0000 mg | ORAL_TABLET | Freq: Two times a day (BID) | ORAL | Status: DC
  Administered 2019-10-10 – 2019-10-13 (×5): 20 mg via ORAL
  Filled 2019-10-10 (×6): qty 1

## 2019-10-10 MED ORDER — SODIUM CHLORIDE 0.9 % IV SOLN: 200.0000 mg | Freq: Once | INTRAVENOUS | Status: DC

## 2019-10-10 MED ORDER — IODIXANOL 320 MG/ML IV SOLN
INTRAVENOUS | Status: DC | PRN
  Administered 2019-10-10: 50 mL

## 2019-10-10 MED ORDER — METHYLPREDNISOLONE SODIUM SUCC 125 MG IJ SOLR
125.0000 mg | Freq: Once | INTRAMUSCULAR | Status: AC
  Administered 2019-10-10: 125 mg via INTRAVENOUS
  Filled 2019-10-10: qty 2

## 2019-10-10 MED ORDER — SODIUM CHLORIDE 0.9 % IV SOLN
200.0000 mg | Freq: Once | INTRAVENOUS | Status: AC
  Administered 2019-10-10: 200 mg via INTRAVENOUS
  Filled 2019-10-10: qty 200

## 2019-10-10 MED ORDER — BARICITINIB 2 MG PO TABS
4.0000 mg | ORAL_TABLET | Freq: Every day | ORAL | Status: DC
  Administered 2019-10-10 – 2019-10-13 (×4): 4 mg via ORAL
  Filled 2019-10-10 (×4): qty 2

## 2019-10-10 MED ORDER — MIDAZOLAM HCL 5 MG/5ML IJ SOLN
INTRAMUSCULAR | Status: AC
  Filled 2019-10-10: qty 5

## 2019-10-10 MED ORDER — SODIUM CHLORIDE 0.9 % IV SOLN
100.0000 mg | Freq: Every day | INTRAVENOUS | Status: DC
  Administered 2019-10-11 – 2019-10-13 (×3): 100 mg via INTRAVENOUS
  Filled 2019-10-10 (×2): qty 20
  Filled 2019-10-10: qty 100
  Filled 2019-10-10: qty 20

## 2019-10-10 MED ORDER — METOPROLOL TARTRATE 25 MG PO TABS
12.5000 mg | ORAL_TABLET | Freq: Two times a day (BID) | ORAL | Status: DC
  Administered 2019-10-10 – 2019-10-12 (×6): 12.5 mg via ORAL
  Filled 2019-10-10 (×7): qty 1

## 2019-10-10 MED ORDER — CEFAZOLIN SODIUM-DEXTROSE 2-4 GM/100ML-% IV SOLN
INTRAVENOUS | Status: AC
  Administered 2019-10-10: 2 g via INTRAVENOUS
  Filled 2019-10-10: qty 100

## 2019-10-10 MED ORDER — IOHEXOL 350 MG/ML SOLN
75.0000 mL | Freq: Once | INTRAVENOUS | Status: AC | PRN
  Administered 2019-10-10: 75 mL via INTRAVENOUS

## 2019-10-10 MED ORDER — SODIUM CHLORIDE 0.9 % IV SOLN
INTRAVENOUS | Status: DC
  Administered 2019-10-10: 200 mL via INTRAVENOUS

## 2019-10-10 MED ORDER — ZINC SULFATE 220 (50 ZN) MG PO CAPS
220.0000 mg | ORAL_CAPSULE | Freq: Every day | ORAL | Status: DC
  Administered 2019-10-10 – 2019-10-13 (×4): 220 mg via ORAL
  Filled 2019-10-10 (×4): qty 1

## 2019-10-10 MED ORDER — ATORVASTATIN CALCIUM 20 MG PO TABS
40.0000 mg | ORAL_TABLET | Freq: Every day | ORAL | Status: DC
  Administered 2019-10-11 – 2019-10-12 (×2): 40 mg via ORAL
  Filled 2019-10-10 (×2): qty 2

## 2019-10-10 MED ORDER — FLUTICASONE PROPIONATE 50 MCG/ACT NA SUSP
2.0000 | Freq: Every day | NASAL | Status: DC
  Administered 2019-10-11 – 2019-10-13 (×3): 2 via NASAL
  Filled 2019-10-10 (×2): qty 16

## 2019-10-10 MED ORDER — PREDNISONE 50 MG PO TABS
50.0000 mg | ORAL_TABLET | Freq: Every day | ORAL | Status: DC
  Administered 2019-10-13: 50 mg via ORAL
  Filled 2019-10-10: qty 1

## 2019-10-10 SURGICAL SUPPLY — 13 items
CANISTER PENUMBRA ENGINE (MISCELLANEOUS) ×3 IMPLANT
CATH ANGIO 5F PIGTAIL 100CM (CATHETERS) ×3 IMPLANT
CATH INDIGO 12 HTORQ 115 (CATHETERS) ×3 IMPLANT
CATH INDIGO SEP 12 (CATHETERS) ×3 IMPLANT
CATH INFINITI JR4 5F (CATHETERS) ×3 IMPLANT
DEVICE TORQUE .025-.038 (MISCELLANEOUS) ×3 IMPLANT
GLIDEWIRE ANGLED SS 035X260CM (WIRE) ×3 IMPLANT
PACK ANGIOGRAPHY (CUSTOM PROCEDURE TRAY) ×3 IMPLANT
SHEATH PINNACLE 11FRX10 (SHEATH) ×3 IMPLANT
SYR MEDRAD MARK 7 150ML (SYRINGE) ×3 IMPLANT
TUBING CONTRAST HIGH PRESS 72 (TUBING) ×3 IMPLANT
WIRE J 3MM .035X145CM (WIRE) ×3 IMPLANT
WIRE MAGIC TORQUE 260C (WIRE) ×3 IMPLANT

## 2019-10-10 NOTE — ED Notes (Signed)
Pt placed in hospital bed with no issue by this RN and Marya Amsler, Therapist, sports.

## 2019-10-10 NOTE — Progress Notes (Signed)
ANTICOAGULATION CONSULT NOTE - Initial Consult  Pharmacy Consult for heparin Indication: chest pain/ACS  No Known Allergies  Patient Measurements: Weight: 135 kg (297 lb 9.9 oz) Heparin Dosing Weight: 86.82 kg  Vital Signs: Temp: 98.4 F (36.9 C) (09/08 2322) Temp Source: Oral (09/08 2322) BP: 152/84 (09/09 0249) Pulse Rate: 80 (09/09 0249)  Labs: Recent Labs    10/09/19 2324  HGB 13.1  HCT 39.5  PLT 132*  CREATININE 0.69  TROPONINIHS 657*    Estimated Creatinine Clearance: 96.1 mL/min (by C-G formula based on SCr of 0.69 mg/dL).   Medical History: Past Medical History:  Diagnosis Date  . Arthritis   . Tumor    breast and pelvic    Medications:  Scheduled:  . heparin  4,000 Units Intravenous Once    Assessment: Patient admitted for SOB w/ nasal congestion after coming from home w/ sick children. Is COVID PCR positive on admission. Patient's initial trop of 657 w/ EKG showing ST abnormalities. Baseline CBC WNL except plts have dropped by roughly 45% since last draw of 192 4 months ago. APTT/INR pending. No anticoagulation PTA. Patient is being started on heparin drip for NSTEMI management.  Goal of Therapy:  Heparin level 0.3-0.7 units/ml Monitor platelets by anticoagulation protocol: Yes   Plan:  Will bolus heparin 4000 units IV x 1 Will start rate at 1200 units/hr and will check anti-Xa level at 0900. Will monitor daily CBC's and adjust per anti-Xa levels.  Tobie Lords, PharmD, BCPS Clinical Pharmacist 10/10/2019,3:05 AM

## 2019-10-10 NOTE — Op Note (Signed)
[facility]  Percutaneous Study/Intervention Procedural Note   Date of Surgery: 10/10/2019,6:38 PM  Surgeon: Leotis Pain  Pre-operative Diagnosis: Symptomatic bilateral pulmonary emboli  Post-operative diagnosis:  Same  Procedure(s) Performed:  1.  Contrast injection right heart  2.  Thrombolysis with 8 mg of TPA in the bilateral pulmonary arteries  3.  Mechanical thrombectomy the right, right middle lobe, right upper lobe, and right lower lobe pulmonary arteries as well as the left main, upper, and lower lobe pulmonary arteries pulmonary arteries  4.  Selective catheter placement right upper lobe, middle lobe, and lower lobe pulmonary arteries  5.  Selective catheter placement left upper and lower lobe pulmonary arteries    Anesthesia: Conscious sedation was administered under my direct supervision by the interventional radiology RN. IV Versed plus fentanyl were utilized. Continuous ECG, pulse oximetry and blood pressure was monitored throughout the entire procedure.  Versed and fentanyl were administered intravenously.  Conscious sedation was administered for a total of 21 minutes using 2 of Versed and 50 mcg of Fentanyl.  EBL: 350 cc  Sheath: 11 French right femoral vein  Contrast: 50 cc   Fluoroscopy Time: 8.1 minutes  Indications:  Patient presents with pulmonary emboli. The patient is symptomatic with hypoxemia and dyspnea on exertion.  There is evidence of right heart strain on the CT angiogram. The patient is otherwise a good candidate for intervention and even the long-term benefits pulmonary angiography with thrombolysis is offered. The risks and benefits are reviewed long-term benefits are discussed. All questions are answered patient agrees to proceed.  Procedure:  Shelsie Tijerino a 64 y.o. female who was identified and appropriate procedural time out was performed.  The patient was then placed supine on the table and prepped and draped in the usual  sterile fashion.  Ultrasound was used to evaluate the right common femoral vein.  It was patent, as it was echolucent and compressible.  A digital ultrasound image was acquired for the permanent record.  A Seldinger needle was used to access the right common femoral vein under direct ultrasound guidance.  A 0.035 J wire was advanced without resistance and a 5Fr sheath was placed and then upsized to an 11 [place] sheath.    The wire and pigtail catheter were then negotiated into the right atrium and bolus injection of contrast was utilized to demonstrate the right ventricle and the pulmonary artery outflow. The wire and catheter were then negotiated into the main pulmonary artery where hand injection of contrast was utilized to demonstrate the pulmonary arteries.  TPA was reconstituted and delivered onto the table. A total of 8 milligrams of TPA was utilized.  4 mg was administered on the left side and 4 mg was administered on the right side. This was then allowed to dwell.  The Penumbra Cat 12 catheter was then advanced up into the pulmonary vasculature. The right lung was addressed first. Catheter was negotiated into the right main and then right middle lobe pulmonary artery where selective imaging showed extensive thrombus and mechanical thrombectomy was performed. Follow-up imaging demonstrated a good result and therefore the catheter was renegotiated into the right upper lobe pulmonary artery and again mechanical thrombectomy was performed after selective imaging. Follow-up imaging was then performed.  This was significantly improved we turned our attention to the right lower lobe where again thrombectomy was performed with the penumbra CAT 12 catheter after selective imaging showed extensive thrombus in the right lower lobe. Passes were made with both the  Penumbra catheter itself as well as introducing the separator throughout the entire right pulmonary vasculature.  Completion imaging showed significant  improvement with a small amount of residual thrombus present in all the lobar branches.  The Penumbra Cat 12 catheter was then negotiated to the opposite side. The left lung was then addressed. Catheter was negotiated into the left lower lobe and mechanical thrombectomy was performed after selective imaging showed extensive thrombus present in the left lower lobe. Follow-up imaging demonstrated a good result and therefore the catheter was renegotiated into the left upper lobe pulmonary artery and again mechanical thrombectomy was performed after selective imaging. Passes were made with both the Penumbra catheter itself as well as introducing the separator in both lobar arteries. Follow-up imaging was then performed.  A small amount of residual thrombus was present but this appeared significantly improved.  Of note, image quality was fairly poor due to patient size and respiratory artifact.  After review these images wires were reintroduced and the catheters removed. Then, the sheath is then pulled and pressures held. A safeguard is placed.    Findings:   Right heart imaging:  Right atrium and right ventricle and the pulmonary outflow tract appears moderately dilated  Right lung: Thrombus in the right main pulmonary artery and all 3 primary pulmonary arteries  Left lung: Thrombus in the left main pulmonary artery and both the left upper and left lower lobe pulmonary arteries    Disposition: Patient was taken to the recovery room in stable condition having tolerated the procedure well.  Leotis Pain 10/10/2019,6:38 PM

## 2019-10-10 NOTE — Consult Note (Signed)
[facility] Vascular Consult Note  MRN : [id number]  Jasmine Webb is a 64 y.o. ([birth year]) female who presents with chief complaint of  Chief Complaint  Patient presents with  . Cough  . Shortness of Breath   History of Present Illness:  Jasmine Webb is a 64 year old caucasian female with a known history of hypertension and osteoarthritis, who presented to the emergency room with acute onset of worsening dyspnea with associated cough with inability to expectorate without wheezing.  She has been having fever and chills and admitted to nausea as well as diarrhea for couple of days.  She admitted to chest pain with cough.  Her sense of taste has been different.  She stated that it has been better with slightly diminished smell sensation.  She has been having diminished appetite and has been taking Pedialyte.  She denies any body aches.  No dysuria, oliguria or hematuria or flank pain.  She has not been vaccinated against COVID-19.  She stated that she has been busy taking care of her 2 grandchildren.  Upon presentation to the emergency room, blood pressure was 142/95 with a heart rate of 108 respiratory rate of 22.  Labs revealed a BNP of 66.1 and high-sensitivity troponin I was 657 and later 557. PT was 35 with INR 1 PT 13.1 COVID-19 PCR came back positive. Two-view chest x-ray showed cardiomegaly with no acute cardiopulmonary disease.Chest CT angiogram revealed bilateral multiple PE with evidence of right ventricular strain consistent with at least submassive PE.  It also showed nodular airspace infiltrates in the lung bases likely indicating COVID-19 pneumonia. Patient denies any discomfort to the bilateral lower extremity. Patient does note some improvement status post initiation of heparin in regard to her shortness of breath.  Vascular surgery was consulted by Dr. Sidney Ace in regard to the patient's diagnosis of PE for possible endovascular intervention.  Current  Facility-Administered Medications  Medication Dose Route Frequency Provider Last Rate Last Admin  . acetaminophen (TYLENOL) tablet 650 mg  650 mg Oral Q6H PRN Mansy, Jan A, MD      . ascorbic acid (VITAMIN C) tablet 500 mg  500 mg Oral Daily Mansy, Jan A, MD   500 mg at 10/10/19 1034  . aspirin EC tablet 81 mg  81 mg Oral Daily Mansy, Jan A, MD   81 mg at 10/10/19 1034  . atorvastatin (LIPITOR) tablet 40 mg  40 mg Oral Daily Mansy, Jan A, MD      . baricitinib [facility]) tablet 4 mg  4 mg Oral Daily Mansy, Jan A, MD   4 mg at 10/10/19 0608  . chlorpheniramine-HYDROcodone (TUSSIONEX) 10-8 MG/5ML suspension 5 mL  5 mL Oral Q12H PRN Mansy, Jan A, MD      . cholecalciferol (VITAMIN D3) tablet 1,000 Units  1,000 Units Oral Daily Mansy, Jan A, MD   1,000 Units at 10/10/19 1034  . famotidine (PEPCID) tablet 20 mg  20 mg Oral BID Mansy, Jan A, MD   20 mg at 10/10/19 0607  . fluticasone (FLONASE) 50 MCG/ACT nasal spray 2 spray  2 spray Each Nare Daily Mansy, Jan A, MD      . guaiFENesin (MUCINEX) 12 hr tablet 600 mg  600 mg Oral BID Mansy, Jan A, MD   600 mg at 10/10/19 1034  . guaiFENesin-dextromethorphan (ROBITUSSIN DM) 100-10 MG/5ML syrup 10 mL  10 mL Oral Q4H PRN Mansy, Jan A, MD      . heparin ADULT infusion 100  units/mL (25000 units/221mL sodium chloride 0.45%)  1,200 Units/hr Intravenous Continuous Alfred Levins, [place], MD 12 mL/hr at 10/10/19 0317 1,200 Units/hr at 10/10/19 0317  . lactated ringers infusion   Intravenous Continuous Rudene Re, MD 125 mL/hr at 10/10/19 0503 New Bag at 10/10/19 0503  . losartan (COZAAR) tablet 100 mg  100 mg Oral Daily Mansy, Jan A, MD   100 mg at 10/10/19 1034  . magnesium hydroxide (MILK OF MAGNESIA) suspension 30 mL  30 mL Oral Daily PRN Mansy, Jan A, MD      . methylPREDNISolone sodium succinate (SOLU-MEDROL) 125 mg/2 mL injection 125 mg  125 mg Intravenous Q12H Mansy, Arvella Merles, MD       Followed by  . [START ON 10/13/2019] predniSONE (DELTASONE) tablet 50 mg   50 mg Oral Daily Mansy, Jan A, MD      . metoprolol tartrate (LOPRESSOR) tablet 12.5 mg  12.5 mg Oral BID Florina Ou V, MD   12.5 mg at 10/10/19 1033  . morphine 2 MG/ML injection 2 mg  2 mg Intravenous Q2H PRN Mansy, Jan A, MD      . nitroGLYCERIN (NITROSTAT) SL tablet 0.4 mg  0.4 mg Sublingual Q5 min PRN Mansy, Jan A, MD      . ondansetron [facility]) tablet 4 mg  4 mg Oral Q6H PRN Mansy, Jan A, MD       Or  . ondansetron [facility]) injection 4 mg  4 mg Intravenous Q6H PRN Mansy, Jan A, MD      . Derrill Memo ON 10/11/2019] remdesivir 100 mg in sodium chloride 0.9 % 100 mL IVPB  100 mg Intravenous Daily Alfred Levins, [place], MD      . traZODone (DESYREL) tablet 25 mg  25 mg Oral QHS PRN Mansy, Jan A, MD      . zinc sulfate capsule 220 mg  220 mg Oral Daily Mansy, Jan A, MD   220 mg at 10/10/19 1034   Current Outpatient Medications  Medication Sig Dispense Refill  . acetaminophen (TYLENOL) 500 MG tablet Take 500 mg by mouth every 6 (six) hours as needed.    . fluticasone (FLONASE) 50 MCG/ACT nasal spray Place 2 sprays into both nostrils daily.    Marland Kitchen losartan (COZAAR) 100 MG tablet Take 1 tablet (100 mg total) by mouth daily. 90 tablet 3  . VITAMIN D PO Take 1,000 Units by mouth daily.      Past Medical History:  Diagnosis Date  . Arthritis   . Tumor    breast and pelvic   Past Surgical History:  Procedure Laterality Date  . ABDOMINAL HYSTERECTOMY  2003  . BREAST SURGERY     Social History Social History   Tobacco Use  . Smoking status: Never Smoker  . Smokeless tobacco: Never Used  Vaping Use  . Vaping Use: Never used  Substance Use Topics  . Alcohol use: Never  . Drug use: Never   Family History Family History  Problem Relation Age of Onset  . COPD Mother   . ALS Father   . Heart disease Sister   . Diabetes Brother   . Hypertension Brother   . Asthma Son   . Breast cancer Sister   . Colon cancer Sister   . Sinusitis Son   Denies family history of peripheral artery disease,  venous disease or renal disease.  Allergies  Allergen Reactions  . Ibuprofen     Told to avoid ibuprofen related products by doctor.   REVIEW OF SYSTEMS (Negative unless  checked)  Constitutional: [] Weight loss  [] Fever  [] Chills Cardiac: [] Chest pain   [] Chest pressure   [] Palpitations   [x] Shortness of breath when laying flat   [x] Shortness of breath at rest   [x] Shortness of breath with exertion. Vascular:  [] Pain in legs with walking   [] Pain in legs at rest   [] Pain in legs when laying flat   [] Claudication   [] Pain in feet when walking  [] Pain in feet at rest  [] Pain in feet when laying flat   [] History of DVT   [] Phlebitis   [] Swelling in legs   [] Varicose veins   [] Non-healing ulcers Pulmonary:   [] Uses home oxygen   [] Productive cough   [] Hemoptysis   [] Wheeze  [] COPD   [] Asthma Neurologic:  [] Dizziness  [] Blackouts   [] Seizures   [] History of stroke   [] History of TIA  [] Aphasia   [] Temporary blindness   [] Dysphagia   [] Weakness or numbness in arms   [] Weakness or numbness in legs Musculoskeletal:  [] Arthritis   [] Joint swelling   [] Joint pain   [] Low back pain Hematologic:  [] Easy bruising  [] Easy bleeding   [] Hypercoagulable state   [] Anemic  [] Hepatitis Gastrointestinal:  [] Blood in stool   [] Vomiting blood  [] Gastroesophageal reflux/heartburn   [] Difficulty swallowing. Genitourinary:  [] Chronic kidney disease   [] Difficult urination  [] Frequent urination  [] Burning with urination   [] Blood in urine Skin:  [] Rashes   [] Ulcers   [] Wounds Psychological:  [] History of anxiety   []  History of major depression.  Physical Examination  Vitals:   10/10/19 0800 10/10/19 0830 10/10/19 1030 10/10/19 1100  BP: (!) 148/87 (!) 155/86 134/88 (!) 150/98  Pulse: (!) 40 80 75 73  Resp: (!) 21 (!) 23 20 (!) 21  Temp:      TempSrc:      SpO2: 99% 97% 98% 100%  Weight:       Body mass index is 51.09 kg/m. Gen:  WD/WN, NAD Head: [place]/AT, No temporalis wasting. Prominent temp pulse not  noted. Ear/Nose/Throat: Hearing grossly intact, nares w/o erythema or drainage, oropharynx w/o Erythema/Exudate Eyes: Sclera non-icteric, conjunctiva clear Neck: Trachea midline.  No JVD.  Pulmonary:  Good air movement, respirations mildly labored, equal bilaterally.  Cardiac: RRR, normal S1, S2. Vascular:  Vessel Right Left  Radial Palpable Palpable  Ulnar Palpable Palpable  Brachial Palpable Palpable  Carotid Palpable, without bruit Palpable, without bruit  Aorta Not palpable N/A  Femoral Palpable Palpable  Popliteal Palpable Palpable  PT Non-Palpable Non-Palpable  DP Non-Palpable Non-Palpable   Right lower extremity: Thigh soft. Calf soft. Extremities warm distally toes. Good capillary refill. Hard to palpate pedal pulses due to body habitus and some mild however the foot is warm. Motor/sensory is intact. There is no acute vascular compromise to extremity at this time.  Left lower extremity: Thigh soft. Calf soft. Extremities warm distally toes. Good capillary refill. Hard to palpate pedal pulses due to body habitus and some mild however the foot is warm. Motor/sensory is intact. There is no acute vascular compromise to extremity at this time.  Gastrointestinal: soft, non-tender/non-distended. No guarding/reflex.  Musculoskeletal: M/S 5/5 throughout.  Extremities without ischemic changes.  No deformity or atrophy. No edema. Neurologic: Sensation grossly intact in extremities.  Symmetrical.  Speech is fluent. Motor exam as listed above. Psychiatric: Judgment intact, Mood & affect appropriate for pt's clinical situation. Dermatologic: No rashes or ulcers noted.  No cellulitis or open wounds. Lymph : No Cervical, Axillary, or Inguinal lymphadenopathy.  CBC Lab Results  Component Value Date   WBC 5.1 10/09/2019   HGB 13.1 10/09/2019   HCT 39.5 10/09/2019   MCV 86.4 10/09/2019   PLT 132 (L) 10/09/2019   BMET    Component Value Date/Time   NA 140 10/09/2019 2324   NA 144  08/16/2019 1652   K 3.8 10/09/2019 2324   CL 104 10/09/2019 2324   CO2 25 10/09/2019 2324   GLUCOSE 125 (H) 10/09/2019 2324   BUN 15 10/09/2019 2324   BUN 16 08/16/2019 1652   CREATININE 0.69 10/09/2019 2324   CALCIUM 8.3 (L) 10/09/2019 2324   GFRNONAA >60 10/09/2019 2324   GFRAA >60 10/09/2019 2324   Estimated Creatinine Clearance: 96.1 mL/min (by C-G formula based on SCr of 0.69 mg/dL).  COAG Lab Results  Component Value Date   INR 1.0 10/10/2019   Radiology DG Chest 2 View  Result Date: 10/10/2019 CLINICAL DATA:  Chest pain, shortness of breath, cough, nasal congestion, and fever for 7 days. EXAM: CHEST - 2 VIEW COMPARISON:  None. FINDINGS: Cardiac enlargement. No vascular congestion or edema. No focal consolidation. No pleural effusions. No pneumothorax. Mediastinal contours appear intact. Degenerative changes in the spine. IMPRESSION: Cardiac enlargement. No evidence of active pulmonary disease. Electronically Signed   By: Lucienne Capers M.D.   On: 10/10/2019 00:22   CT Angio Chest PE W and/or Wo Contrast  Result Date: 10/10/2019 CLINICAL DATA:  Shortness of breath for several days. COVID positive. High suspicion of pulmonary embolus. EXAM: CT ANGIOGRAPHY CHEST WITH CONTRAST TECHNIQUE: Multidetector CT imaging of the chest was performed using the standard protocol during bolus administration of intravenous contrast. Multiplanar CT image reconstructions and MIPs were obtained to evaluate the vascular anatomy. CONTRAST:  3mL OMNIPAQUE IOHEXOL 350 MG/ML SOLN COMPARISON:  None. FINDINGS: Cardiovascular: Examination is technically limited due to motion artifact. There is good opacification of the central and segmental pulmonary arteries. Multiple filling defects in the distal main pulmonary arteries, extending into bilateral upper and lower lobe lobar and segmental branches consistent with multiple pulmonary emboli. RV to LV ratio is 1.5 suggesting evidence of right heart strain. No  pericardial effusions. Normal caliber thoracic aorta. No aortic dissection. Mediastinum/Nodes: Mediastinal lymph nodes are not pathologically enlarged, likely reactive. Esophagus is decompressed. Lungs/Pleura: Motion artifact limits examination. Nodular airspace infiltrates in the lung bases likely indicate COVID pneumonia. Upper Abdomen: No acute process demonstrated in the visualized upper abdomen. Musculoskeletal: Degenerative changes in the spine. No destructive bone lesions. Review of the MIP images confirms the above findings. IMPRESSION: 1. Positive examination for multiple bilateral pulmonary emboli with CT evidence of right heart strain (RV/LV Ratio = 1.5) consistent with at least submassive (intermediate risk) PE. The presence of right heart strain has been associated with an increased risk of morbidity and mortality. 2. Nodular airspace infiltrates in the lung bases likely indicate COVID pneumonia. Critical Value/emergent results were called by telephone at the time of interpretation on 10/10/2019 at 4:36 am to provider [facility] , who verbally acknowledged these results. Electronically Signed   By: Lucienne Capers M.D.   On: 10/10/2019 04:40   Assessment/Plan Atarah Cadogan is a 65 year old caucasian female with a known history of hypertension and osteoarthritis, who presented to the emergency room with acute onset of worsening dyspnea   1. Pulmonary Embolism: Patient with acute onset of shortness of breath. Dyspnea worse with exertion. CTA chest on October 10, 2019 was notable for multiple bilateral pulmonary emboli with CT evidence of right heart strain consistent  with at least submassive PE. Reviewed images with Dr. Lucky Cowboy large clot burden noted on CTA. In the setting of acute onset of shortness of breath needing supplemental O2, large clot burden noted on CTA with evidence of right heart strain recommend a pulmonary thrombectomy/thrombolysis in an attempt to lessen the clot burden, decrease  stress on the heart and improve the patient's symptoms. Procedure, risks and benefits were explained to the patient. All questions were answered. The patient wishes to proceed. We will plan on this today with Dr. Lucky Cowboy. Patient does understand that she will be on anticoagulation for at least a year. Pending venous duplex.  2. COVID-19: On airborne precautions. Being treated with Remdesivir / Olumiant  3. Possible DVT: Venous duplex pending. Patient denies any bilateral lower extremity pain / abnormal swelling. No acute vascular compromise noted on exam to the bilateral extremities. Mild to moderate edema bilaterally. Based on physical exam and lack of symptoms do not feel that the patient would need peripheral venous thrombectomy / lysis at this time. Will transition from heparin to PO Eliquis most likely tomorrow.  Discussed with Dr. Mayme Genta, PA-C  10/10/2019 11:24 AM  This note was created with Dragon medical transcription system.  Any error is purely unintentional

## 2019-10-10 NOTE — Assessment & Plan Note (Signed)
  Current Facility-Administered Medications:  .  aspirin EC tablet 81 mg, 81 mg, Oral, Daily, Mansy, Jan A, MD .  heparin ADULT infusion 100 units/mL (25000 units/257mL sodium chloride 0.45%), 1,200 Units/hr, Intravenous, Continuous, Alfred Levins, [place], MD, Last Rate: 12 mL/hr at 10/10/19 0317, 1,200 Units/hr at 10/10/19 0317 .  lactated ringers infusion, , Intravenous, Continuous, Alfred Levins, [place], MD, Last Rate: 125 mL/hr at 10/10/19 0503, New Bag at 10/10/19 0503 .  metoprolol tartrate (LOPRESSOR) tablet 12.5 mg, 12.5 mg, Oral, BID, Para Skeans, MD  Echo pending.

## 2019-10-10 NOTE — Progress Notes (Signed)
Remdesivir - Pharmacy Brief Note   O:  CXR: IMPRESSION: Cardiac enlargement. No evidence of active pulmonary disease.  SpO2: 90% on RA   A/P:  Remdesivir 200 mg IVPB once followed by 100 mg IVPB daily x 4 days.   Tobie Lords, PharmD, BCPS Clinical Pharmacist 10/10/2019 2:53 AM

## 2019-10-10 NOTE — H&P (Addendum)
PATIENT NAME: Jasmine Webb    MR#:  [id number]  DATE OF BIRTH:  1955/06/07  DATE OF ADMISSION:  10/10/2019  PRIMARY CARE PHYSICIAN: Valerie Roys, DO   REQUESTING/REFERRING PHYSICIAN: Gonzella Lex, MD  CHIEF COMPLAINT:   Chief Complaint  Patient presents with  . Cough  . Shortness of Breath    HISTORY OF PRESENT ILLNESS:  Jasmine Webb  is a 64 y.o. Caucasian female with a known history of hypertension and osteoarthritis, who presented to the emergency room with acute onset of worsening dyspnea with associated cough with inability to expectorate without wheezing.  She has been having fever and chills and admitted to nausea as well as diarrhea for couple of days.  She admitted to chest pain with cough.  Her sense of taste has been different.  She stated that it has been better with slightly diminished smell sensation.  She has been having diminished appetite and has been taking Pedialyte.  She denies any body aches.  No dysuria, oliguria or hematuria or flank pain.  She has not been vaccinated against COVID-19.  She stated that she has been busy taking care of her 2 grandchildren.  Upon presentation  to the emergency room, blood pressure was 142/95 with a heart rate of 108 respiratory rate of 22.  Labs revealed a BNP of 66.1 and high-sensitivity troponin I was 657 and later 557.  PT was 35 with INR 1 PT 13.1 COVID-19 PCR came back positive.  Two-view chest x-ray showed cardiomegaly with no acute cardiopulmonary disease.Chest CT angiogram revealed bilateral multiple PE with evidence of right ventricular strain consistent with at least submassive PE.  It also showed nodular airspace infiltrates in the lung bases likely indicating COVID-19 pneumonia.  The patient was given 1 L bolus of IV lactated Ringer followed by 125 mL/h, and 25 mg IV Solu-Medrol, IV remdesivir and IV heparin bolus followed by drip.  Contact was made with Dr. Lucky Cowboy with vascular surgery and he will  attempt intra-arterial thrombolysis later today.  She will be admitted to a progressive unit bed for further evaluation and management.  PAST MEDICAL HISTORY:   Past Medical History:  Diagnosis Date  . Arthritis   . Tumor    breast and pelvic  Hypertension  PAST SURGICAL HISTORY:   Past Surgical History:  Procedure Laterality Date  . ABDOMINAL HYSTERECTOMY  2003  . BREAST SURGERY      SOCIAL HISTORY:   Social History   Tobacco Use  . Smoking status: Never Smoker  . Smokeless tobacco: Never Used  Substance Use Topics  . Alcohol use: Never    FAMILY HISTORY:   Family History  Problem Relation Age of Onset  . COPD Mother   . ALS Father   . Heart disease Sister   . Diabetes Brother   . Hypertension Brother   . Asthma Son   . Breast cancer Sister   . Colon cancer Sister   . Sinusitis Son     DRUG ALLERGIES:   Allergies  Allergen Reactions  . Ibuprofen     Told to avoid ibuprofen related products by doctor.    REVIEW OF SYSTEMS:   ROS As per history of present illness. All pertinent systems were reviewed above. Constitutional, HEENT, cardiovascular, respiratory, GI, GU, musculoskeletal, neuro, psychiatric, endocrine, integumentary and hematologic systems were reviewed and are otherwise negative/unremarkable except for positive findings mentioned above in the HPI.   MEDICATIONS AT HOME:  Prior to Admission medications   Medication Sig Start Date End Date Taking? Authorizing Provider  acetaminophen (TYLENOL) 500 MG tablet Take 500 mg by mouth every 6 (six) hours as needed.   Yes [provider]  fluticasone (FLONASE) 50 MCG/ACT nasal spray Place 2 sprays into both nostrils daily.   Yes [provider]  losartan (COZAAR) 100 MG tablet Take 1 tablet (100 mg total) by mouth daily. 07/05/19  Yes Cannady, Jolene T, NP  VITAMIN D PO Take 1,000 Units by mouth daily.    Yes [provider]      VITAL SIGNS:  Blood pressure (!) 149/98,  pulse 90, temperature 98.4 F (36.9 C), temperature source Oral, resp. rate 19, weight 135 kg, SpO2 97 %.  PHYSICAL EXAMINATION:  Physical Exam  GENERAL:  64 y.o.-year-old patient lying in the bed with mild respiratory distress with conversational dyspnea. EYES: Pupils equal, round, reactive to light and accommodation. No scleral icterus. Extraocular muscles intact.  HEENT: Head atraumatic, normocephalic. Oropharynx and nasopharynx clear.  NECK:  Supple, no jugular venous distention. No thyroid enlargement, no tenderness.  LUNGS: Diminished bibasal breath sounds with bibasal crackles. CARDIOVASCULAR: Regular rate and rhythm, S1, S2 normal. No murmurs, rubs, or gallops.  ABDOMEN: Soft, nondistended, nontender. Bowel sounds present. No organomegaly or mass.  EXTREMITIES: 1+ bilateral lower extremity pitting edema with no cyanosis, or clubbing.  NEUROLOGIC: Cranial nerves II through XII are intact. Muscle strength 5/5 in all extremities. Sensation intact. Gait not checked.  PSYCHIATRIC: The patient is alert and oriented x 3.  Normal affect and good eye contact. SKIN: No obvious rash, lesion, or ulcer.   LABORATORY PANEL:   CBC Recent Labs  Lab 10/09/19 2324  WBC 5.1  HGB 13.1  HCT 39.5  PLT 132*   ------------------------------------------------------------------------------------------------------------------  Chemistries  Recent Labs  Lab 10/09/19 2324  NA 140  K 3.8  CL 104  CO2 25  GLUCOSE 125*  BUN 15  CREATININE 0.69  CALCIUM 8.3*   ------------------------------------------------------------------------------------------------------------------  Cardiac Enzymes No results for input(s): TROPONINI in the last 168 hours. ------------------------------------------------------------------------------------------------------------------  RADIOLOGY:  DG Chest 2 View  Result Date: 10/10/2019 CLINICAL DATA:  Chest pain, shortness of breath, cough, nasal congestion, and  fever for 7 days. EXAM: CHEST - 2 VIEW COMPARISON:  None. FINDINGS: Cardiac enlargement. No vascular congestion or edema. No focal consolidation. No pleural effusions. No pneumothorax. Mediastinal contours appear intact. Degenerative changes in the spine. IMPRESSION: Cardiac enlargement. No evidence of active pulmonary disease. Electronically Signed   By: Lucienne Capers M.D.   On: 10/10/2019 00:22   CT Angio Chest PE W and/or Wo Contrast  Result Date: 10/10/2019 CLINICAL DATA:  Shortness of breath for several days. COVID positive. High suspicion of pulmonary embolus. EXAM: CT ANGIOGRAPHY CHEST WITH CONTRAST TECHNIQUE: Multidetector CT imaging of the chest was performed using the standard protocol during bolus administration of intravenous contrast. Multiplanar CT image reconstructions and MIPs were obtained to evaluate the vascular anatomy. CONTRAST:  54mL OMNIPAQUE IOHEXOL 350 MG/ML SOLN COMPARISON:  None. FINDINGS: Cardiovascular: Examination is technically limited due to motion artifact. There is good opacification of the central and segmental pulmonary arteries. Multiple filling defects in the distal main pulmonary arteries, extending into bilateral upper and lower lobe lobar and segmental branches consistent with multiple pulmonary emboli. RV to LV ratio is 1.5 suggesting evidence of right heart strain. No pericardial effusions. Normal caliber thoracic aorta. No aortic dissection. Mediastinum/Nodes: Mediastinal lymph nodes are not pathologically enlarged, likely reactive. Esophagus  is decompressed. Lungs/Pleura: Motion artifact limits examination. Nodular airspace infiltrates in the lung bases likely indicate COVID pneumonia. Upper Abdomen: No acute process demonstrated in the visualized upper abdomen. Musculoskeletal: Degenerative changes in the spine. No destructive bone lesions. Review of the MIP images confirms the above findings. IMPRESSION: 1. Positive examination for multiple bilateral pulmonary  emboli with CT evidence of right heart strain (RV/LV Ratio = 1.5) consistent with at least submassive (intermediate risk) PE. The presence of right heart strain has been associated with an increased risk of morbidity and mortality. 2. Nodular airspace infiltrates in the lung bases likely indicate COVID pneumonia. Critical Value/emergent results were called by telephone at the time of interpretation on 10/10/2019 at 4:36 am to provider [facility] , who verbally acknowledged these results. Electronically Signed   By: Lucienne Capers M.D.   On: 10/10/2019 04:40      IMPRESSION AND PLAN:   1.  Acute hypoxemic respiratory failure secondary to COVID-19. -The patient will be admitted to a medically monitored isolation bed. -O2 protocol will be followed to keep O2 saturation above 93.   2.    Multifocal pneumonia secondary to COVID-19. -The patient will be admitted to an isolation monitored bed with droplet and contact precautions. -Given multifocal pneumonia we will empirically place the patient on IV Rocephin and Zithromax for possible bacterial superinfection only with elevated Procalcitonin. -The patient will be placed on scheduled Mucinex and as needed Tussionex. -We will avoid nebulization as much as we can, give bronchodilator MDI if needed, and with deterioration of oxygenation try to avoid BiPAP/CPAP if possible.    -Will obtain sputum Gram stain culture and sensitivity and follow blood cultures. -O2 protocol will be followed. -We will follow CRP, ferritin, LDH and D-dimer. -Will follow manual differential for ANC/ALC ratio as well as follow troponin I and daily CBC with manual differential and CMP. - Will place the patient on IV Remdesivir and IV steroid therapy with Decadron with elevated inflammatory markers. -The patient will be placed on vitamin D3, vitamin C, zinc sulfate, p.o. Pepcid and aspirin. -I discussed Baricitinib with the patient and she agreed to proceed with it.  3.   Acute bilateral submassive pulmonary embolism with right ventricular strain with elevated troponin I and chest pain. -The patient will be placed on IV heparin. -We will obtain a 2D echo for assessment in a.m. -Bilateral lower extremity venous Doppler to rule out DVT as a source for her PE. -Vascular surgery consult was ordered. -Dr. Lucky Cowboy is aware about the patient and will be planning an intra-arterial thrombosis later today. -We will follow serial troponin I's and obtain a 2D echo as mentioned above. -Given associated chest pain will place on as needed sublingual nitroglycerin and morphine sulfate. -She will also be placed on beta-blocker therapy and statin therapy.  Aspirin was held off given history of NSAIDs allergy.  4.  Essential hypertension. -We will continue Cozaar.  5.  DVT prophylaxis. -The patient will be on IV heparin.  All the records are reviewed and case discussed with ED provider. The plan of care was discussed in details with the patient (and family). I answered all questions. The patient agreed to proceed with the above mentioned plan. Further management will depend upon hospital course.   CODE STATUS: Full code  Status is: Inpatient  Remains inpatient appropriate because:Hemodynamically unstable, Ongoing diagnostic testing needed not appropriate for outpatient work up, Unsafe d/c plan, IV treatments appropriate due to intensity of illness or inability to take  PO and Inpatient level of care appropriate due to severity of illness   Dispo: The patient is from: Home              Anticipated d/c is to: Home              Anticipated d/c date is: 3 days              Patient currently is not medically stable to d/c.   TOTAL TIME TAKING CARE OF THIS PATIENT: 55 minutes.    Christel Mormon M.D on 10/10/2019 at 5:29 AM  [facility]   From 7 PM-7 AM, contact night-coverage [email or web address]  CC: Primary care physician; Valerie Roys, DO   Note: This dictation was  prepared with Dragon dictation along with smaller phrase technology. Any transcriptional typo errors that result from this process are unintentional.

## 2019-10-10 NOTE — Progress Notes (Signed)
Jasmine Webb for heparin Indication: chest pain/ACS  Allergies  Allergen Reactions   Ibuprofen     Told to avoid ibuprofen related products by doctor.    Patient Measurements: Weight: 135 kg (297 lb 9.9 oz) Heparin Dosing Weight: 86.82 kg  Vital Signs: BP: 152/92 (09/09 1630) Pulse Rate: 71 (09/09 1630)  Labs: Recent Labs    10/09/19 2324 10/09/19 2324 10/10/19 0255 10/10/19 0301 10/10/19 0535 10/10/19 0851 10/10/19 1643  HGB 13.1  --   --   --   --   --   --   HCT 39.5  --   --   --   --   --   --   PLT 132*  --   --   --   --   --   --   APTT  --   --   --  35  --   --   --   LABPROT  --   --   --  13.1  --   --   --   INR  --   --   --  1.0  --   --   --   HEPARINUNFRC  --   --   --   --   --  0.54 0.66  CREATININE 0.69  --   --   --   --   --   --   TROPONINIHS 657*   < > 557*  --  423* 334*  --    < > = values in this interval not displayed.    Estimated Creatinine Clearance: 96.1 mL/min (by C-G formula based on SCr of 0.69 mg/dL).   Medical History: Past Medical History:  Diagnosis Date   Arthritis    Tumor    breast and pelvic    Medications:  Scheduled:   vitamin C  500 mg Oral Daily   aspirin EC  81 mg Oral Daily   atorvastatin  40 mg Oral Daily   baricitinib  4 mg Oral Daily   cholecalciferol  1,000 Units Oral Daily   famotidine  20 mg Oral BID   fluticasone  2 spray Each Nare Daily   guaiFENesin  600 mg Oral BID   losartan  100 mg Oral Daily   methylPREDNISolone (SOLU-MEDROL) injection  125 mg Intravenous Q12H   Followed by   Derrill Memo ON 10/13/2019] predniSONE  50 mg Oral Daily   metoprolol tartrate  12.5 mg Oral BID   zinc sulfate  220 mg Oral Daily    Assessment: Patient admitted for SOB w/ nasal congestion after coming from home w/ sick children. Is COVID PCR positive on admission. Patient's initial trop of 657 w/ EKG showing ST abnormalities. Baseline CBC WNL except plts have dropped  by roughly 45% since last draw of 192 4 months ago. APTT/INR pending. No anticoagulation PTA. Patient is being started on heparin drip for NSTEMI management.  09/09 @ 0851 HL 0.54, therapeutic x 1 09/09 @ 1643 HL 0.66 therapeutic x 2  Goal of Therapy:  Heparin level 0.3-0.7 units/ml Monitor platelets by anticoagulation protocol: Yes   Plan:  Will continue heparin rate at 1200 units/hr and will check anti-Xa level with am labs Will monitor daily CBC's and adjust per anti-Xa levels.  Lu Duffel, PharmD, BCPS Clinical Pharmacist 10/10/2019 5:37 PM

## 2019-10-10 NOTE — ED Notes (Signed)
Phone taken to pt to call her family member. Pt denies any further needs.

## 2019-10-10 NOTE — ED Notes (Signed)
Pt cleaned up by this RN. Pt wet bed, linens changed and purewick adjusted.

## 2019-10-10 NOTE — Progress Notes (Signed)
*  PRELIMINARY RESULTS* Echocardiogram 2D Echocardiogram has been performed.  Jasmine Webb 10/10/2019, 9:48 AM

## 2019-10-10 NOTE — Progress Notes (Addendum)
[place] for heparin Indication: chest pain/ACS  Allergies  Allergen Reactions  . Ibuprofen     Told to avoid ibuprofen related products by doctor.    Patient Measurements: Weight: 135 kg (297 lb 9.9 oz) Heparin Dosing Weight: 86.82 kg  Vital Signs: Temp: 98.4 F (36.9 C) (09/08 2322) Temp Source: Oral (09/08 2322) BP: 155/86 (09/09 0830) Pulse Rate: 80 (09/09 0830)  Labs: Recent Labs    10/09/19 2324 10/10/19 0255 10/10/19 0301 10/10/19 0535 10/10/19 0851  HGB 13.1  --   --   --   --   HCT 39.5  --   --   --   --   PLT 132*  --   --   --   --   APTT  --   --  35  --   --   LABPROT  --   --  13.1  --   --   INR  --   --  1.0  --   --   HEPARINUNFRC  --   --   --   --  0.54  CREATININE 0.69  --   --   --   --   TROPONINIHS 657* 557*  --  423*  --     Estimated Creatinine Clearance: 96.1 mL/min (by C-G formula based on SCr of 0.69 mg/dL).   Medical History: Past Medical History:  Diagnosis Date  . Arthritis   . Tumor    breast and pelvic    Medications:  Scheduled:  . vitamin C  500 mg Oral Daily  . aspirin EC  81 mg Oral Daily  . atorvastatin  40 mg Oral Daily  . baricitinib  4 mg Oral Daily  . cholecalciferol  1,000 Units Oral Daily  . famotidine  20 mg Oral BID  . fluticasone  2 spray Each Nare Daily  . guaiFENesin  600 mg Oral BID  . losartan  100 mg Oral Daily  . methylPREDNISolone (SOLU-MEDROL) injection  125 mg Intravenous Q12H   Followed by  . [START ON 10/13/2019] predniSONE  50 mg Oral Daily  . metoprolol tartrate  12.5 mg Oral BID  . zinc sulfate  220 mg Oral Daily    Assessment: Patient admitted for SOB w/ nasal congestion after coming from home w/ sick children. Is COVID PCR positive on admission. Patient's initial trop of 657 w/ EKG showing ST abnormalities. Baseline CBC WNL except plts have dropped by roughly 45% since last draw of 192 4 months ago. APTT/INR pending. No anticoagulation PTA.  Patient is being started on heparin drip for NSTEMI management.  09/09 @ 0851 HL 0.54, therapeutic.  Goal of Therapy:  Heparin level 0.3-0.7 units/ml Monitor platelets by anticoagulation protocol: Yes   Plan:  Will heparin rate at 1200 units/hr and will check anti-Xa level at 1500. Will monitor daily CBC's and adjust per anti-Xa levels.  Kristeen Miss, PharmD Clinical Pharmacist 10/10/2019,9:50 AM

## 2019-10-10 NOTE — ED Notes (Signed)
Cardiology at bedside.

## 2019-10-10 NOTE — Progress Notes (Signed)
Patient arrived to room during shift change. Specials nurse with patient still obtaining vital signs. Tele applied, dressing to right groin assessed.

## 2019-10-10 NOTE — ED Notes (Signed)
Pt placed on purwick per request. Pt comfortable at this time, no further needs noted.

## 2019-10-10 NOTE — Assessment & Plan Note (Signed)
SpO2: 99 % O2 Flow Rate (L/min): 2 L/min  COVID-19 Labs  Recent Labs    10/10/19 0535  FERRITIN 251  LDH 215*    Lab Results  Component Value Date   SARSCOV2NAA POSITIVE (A) 10/09/2019   Current meds: .  methylPREDNISolone sodium succinate (SOLU-MEDROL) 125 mg/2 mL injection 125 mg **IN FOLLOWED-BY LINKED GROUP WITH** [START ON 10/13/2019] predniSONE (DELTASONE) tablet 50 mg   .  chlorpheniramine-HYDROcodone (TUSSIONEX) 10-8 MG/5ML suspension 5 mL .  fluticasone (FLONASE) 50 MCG/ACT nasal spray 2 spray .  guaiFENesin (MUCINEX) 12 hr tablet 600 mg .  guaiFENesin-dextromethorphan (ROBITUSSIN DM) 100-10 MG/5ML syrup 10 mL .  acetaminophen (TYLENOL) tablet 650 mg .  aspirin EC tablet 81 mg .  baricitinib (OLUMIANT) tablet 4 mg .  morphine 2 MG/ML injection 2 mg .  ascorbic acid (VITAMIN C) tablet 500 mg .  [COMPLETED] remdesivir 200 mg in sodium chloride 0.9% 250 mL IVPB **IN FOLLOWED-BY LINKED GROUP WITH** [START ON 10/11/2019] remdesivir 100 mg in sodium chloride 0.9 % 100 mL IVPB .  zinc sulfate capsule 220 mg

## 2019-10-10 NOTE — ED Provider Notes (Signed)
[facility] Emergency Department Provider Note  ____________________________________________  Time seen: Approximately 2:57 AM  I have reviewed the triage vital signs and the nursing notes.   HISTORY  Chief Complaint Cough and Shortness of Breath   HPI Jasmine Webb is a 64 y.o. female with history of morbid obesity, hypertension, arthritis who presents for evaluation of cough and shortness of breath.  Patient reports that her son, his girlfriend and son have all had Covid and have come to the house to visit her.  She is not vaccinated.  She started having symptoms a week ago.  Her symptoms are headache, sore throat, loss of taste and smell, shortness of breath which started tonight, cough, chest tightness which also started tonight, diarrhea.  Patient denies any personal or family history of blood clots, recent travel immobilization, leg pain or swelling, hemoptysis or exogenous hormones.  She is not a smoker.  No personal family history of cardiac disease.   Past Medical History:  Diagnosis Date  . Arthritis   . Tumor    breast and pelvic    Patient Active Problem List   Diagnosis Date Noted  . Morbid obesity due to excess calories ([place]) 08/09/2019  . Elevated LDL cholesterol level 07/05/2019  . Vitamin D deficiency 07/05/2019  . Essential hypertension 04/16/2019  . Preventative health care 04/16/2019  . Allergic rhinitis 04/16/2019  . Arthritis 04/16/2019  . BMI 50.0-59.9, adult ([place]) 04/16/2019    Past Surgical History:  Procedure Laterality Date  . ABDOMINAL HYSTERECTOMY  2003  . BREAST SURGERY      Prior to Admission medications   Medication Sig Start Date End Date Taking? Authorizing Provider  acetaminophen (TYLENOL) 500 MG tablet Take 500 mg by mouth every 6 (six) hours as needed.   Yes [provider]  fluticasone (FLONASE) 50 MCG/ACT nasal spray Place 2 sprays into both nostrils daily.   Yes [provider]  losartan  (COZAAR) 100 MG tablet Take 1 tablet (100 mg total) by mouth daily. 07/05/19  Yes Cannady, Jolene T, NP  VITAMIN D PO Take 1,000 Units by mouth daily.    Yes [provider]    Allergies Ibuprofen  Family History  Problem Relation Age of Onset  . COPD Mother   . ALS Father   . Heart disease Sister   . Diabetes Brother   . Hypertension Brother   . Asthma Son   . Breast cancer Sister   . Colon cancer Sister   . Sinusitis Son     Social History Social History   Tobacco Use  . Smoking status: Never Smoker  . Smokeless tobacco: Never Used  Vaping Use  . Vaping Use: Never used  Substance Use Topics  . Alcohol use: Never  . Drug use: Never    Review of Systems  Constitutional: + fever. Eyes: Negative for visual changes. ENT: + sore throat. Neck: No neck pain  Cardiovascular: + chest pain. Respiratory: + shortness of breath and cough Gastrointestinal: Negative for abdominal pain, vomiting. + diarrhea. Genitourinary: Negative for dysuria. Musculoskeletal: Negative for back pain. Skin: Negative for rash. Neurological: Negative for headaches, weakness or numbness. Psych: No SI or HI  ____________________________________________   PHYSICAL EXAM:  VITAL SIGNS: ED Triage Vitals [10/09/19 2322]  Enc Vitals Group     BP (!) 142/95     Pulse Rate 100     Resp (!) 22     Temp 98.4 F (36.9 C)     Temp  Source Oral     SpO2 90 %     Weight      Height      Head Circumference      Peak Flow      Pain Score      Pain Loc      Pain Edu?      Excl. in [place]?     Constitutional: Alert and oriented. Well appearing and in no apparent distress. HEENT:      Head: Normocephalic and atraumatic.         Eyes: Conjunctivae are normal. Sclera is non-icteric.       Mouth/Throat: Mucous membranes are moist.       Neck: Supple with no signs of meningismus. Cardiovascular: Tachycardic with regular rhythm and no murmurs Respiratory: Normal work of breathing, sats in the  low 90s which improved on 2 L nasal cannula. Gastrointestinal: Soft, non tender, and non distended Musculoskeletal:No edema, cyanosis, or erythema of extremities. Neurologic: Normal speech and language. Face is symmetric. Moving all extremities. No gross focal neurologic deficits are appreciated. Skin: Skin is warm, dry and intact. No rash noted. Psychiatric: Mood and affect are normal. Speech and behavior are normal.  ____________________________________________   LABS (all labs ordered are listed, but only abnormal results are displayed)  Labs Reviewed  SARS CORONAVIRUS 2 BY RT PCR (HOSPITAL ORDER, [facility]) - Abnormal; Notable for the following components:      Result Value   SARS Coronavirus 2 POSITIVE (*)    All other components within normal limits  BASIC METABOLIC PANEL - Abnormal; Notable for the following components:   Glucose, Bld 125 (*)    Calcium 8.3 (*)    All other components within normal limits  CBC - Abnormal; Notable for the following components:   Platelets 132 (*)    All other components within normal limits  TROPONIN I (HIGH SENSITIVITY) - Abnormal; Notable for the following components:   Troponin I (High Sensitivity) 657 (*)    All other components within normal limits  TROPONIN I (HIGH SENSITIVITY) - Abnormal; Notable for the following components:   Troponin I (High Sensitivity) 557 (*)    All other components within normal limits  APTT  PROTIME-INR  HEPARIN LEVEL (UNFRACTIONATED)  BRAIN NATRIURETIC PEPTIDE   ____________________________________________  EKG  ED ECG REPORT I, Rudene Re, the attending physician, personally viewed and interpreted this ECG.   Sinus tachycardia, rate of 118, normal intervals, normal axis, diffuse ST depressions with no ST elevation. ____________________________________________  RADIOLOGY  I have personally reviewed the images performed during this visit and I agree with the  Radiologist's read.   Interpretation by Radiologist:  DG Chest 2 View  Result Date: 10/10/2019 CLINICAL DATA:  Chest pain, shortness of breath, cough, nasal congestion, and fever for 7 days. EXAM: CHEST - 2 VIEW COMPARISON:  None. FINDINGS: Cardiac enlargement. No vascular congestion or edema. No focal consolidation. No pleural effusions. No pneumothorax. Mediastinal contours appear intact. Degenerative changes in the spine. IMPRESSION: Cardiac enlargement. No evidence of active pulmonary disease. Electronically Signed   By: Lucienne Capers M.D.   On: 10/10/2019 00:22   CT Angio Chest PE W and/or Wo Contrast  Result Date: 10/10/2019 CLINICAL DATA:  Shortness of breath for several days. COVID positive. High suspicion of pulmonary embolus. EXAM: CT ANGIOGRAPHY CHEST WITH CONTRAST TECHNIQUE: Multidetector CT imaging of the chest was performed using the standard protocol during bolus administration of intravenous contrast. Multiplanar CT image  reconstructions and MIPs were obtained to evaluate the vascular anatomy. CONTRAST:  86mL OMNIPAQUE IOHEXOL 350 MG/ML SOLN COMPARISON:  None. FINDINGS: Cardiovascular: Examination is technically limited due to motion artifact. There is good opacification of the central and segmental pulmonary arteries. Multiple filling defects in the distal main pulmonary arteries, extending into bilateral upper and lower lobe lobar and segmental branches consistent with multiple pulmonary emboli. RV to LV ratio is 1.5 suggesting evidence of right heart strain. No pericardial effusions. Normal caliber thoracic aorta. No aortic dissection. Mediastinum/Nodes: Mediastinal lymph nodes are not pathologically enlarged, likely reactive. Esophagus is decompressed. Lungs/Pleura: Motion artifact limits examination. Nodular airspace infiltrates in the lung bases likely indicate COVID pneumonia. Upper Abdomen: No acute process demonstrated in the visualized upper abdomen. Musculoskeletal: Degenerative  changes in the spine. No destructive bone lesions. Review of the MIP images confirms the above findings. IMPRESSION: 1. Positive examination for multiple bilateral pulmonary emboli with CT evidence of right heart strain (RV/LV Ratio = 1.5) consistent with at least submassive (intermediate risk) PE. The presence of right heart strain has been associated with an increased risk of morbidity and mortality. 2. Nodular airspace infiltrates in the lung bases likely indicate COVID pneumonia. Critical Value/emergent results were called by telephone at the time of interpretation on 10/10/2019 at 4:36 am to provider [facility] , who verbally acknowledged these results. Electronically Signed   By: Lucienne Capers M.D.   On: 10/10/2019 04:40     ____________________________________________   PROCEDURES  Procedure(s) performed:yes .1-3 Lead EKG Interpretation Performed by: Rudene Re, MD Authorized by: Rudene Re, MD     Interpretation: non-specific     ECG rate assessment: tachycardic     Rhythm: sinus tachycardia     Ectopy: none     Critical Care performed: yes  CRITICAL CARE Performed by: Rudene Re  ?  Total critical care time: 45 min  Critical care time was exclusive of separately billable procedures and treating other patients.  Critical care was necessary to treat or prevent imminent or life-threatening deterioration.  Critical care was time spent personally by me on the following activities: development of treatment plan with patient and/or surrogate as well as nursing, discussions with consultants, evaluation of patient's response to treatment, examination of patient, obtaining history from patient or surrogate, ordering and performing treatments and interventions, ordering and review of laboratory studies, ordering and review of radiographic studies, pulse oximetry and re-evaluation of patient's  condition.  ____________________________________________   INITIAL IMPRESSION / ASSESSMENT AND PLAN / ED COURSE  64 y.o. female with history of morbid obesity, hypertension, arthritis who presents for evaluation of 7 days of Covid-like symptoms after being exposed to 3 family members were positive for Covid.  Patient is unvaccinated. Covid is positive here. O2 in the low 90s with normal work of breathing.  Patient placed on 2 L nasal cannula.  EKG showing diffuse ST depressions with no ST elevations and troponin is elevated at 657.  Differential including Covid myocarditis versus demand ischemia in the setting of Covid versus NSTEMI versus PE.  Will get a CT angio of the chest.  Will start patient on heparin.  Chest x-ray showed no infiltrates, confirmed by radiology.  Will start patient on remdesivir and steroids.  Old medical records reviewed.  _________________________ 4:40 AM on 10/10/2019 -----------------------------------------  CT concerning for large clot burden with bilateral segmental PEs and evidence of right heart strain. Patient already on heparin drip. Will discuss with vascular for possible intra-arterial thrombolysis. Will consult hospitalist  for admission. Patient remains HD stable. Will give IVF bolus to preserve preload.    _________________________ 4:46 AM on 10/10/2019 ----------------------------------------- Discussed with Dr. Lucky Cowboy from vascular surgery who agrees the patient is a good candidate for intra-arterial thrombolysis.  Because she is Covid positive she will be the last case in the OR today. Recommended continuing heparin until the procedure and keeping her NPO. This plan was discussed with Dr. Sidney Ace. Will initiate IV hydration.      _____________________________________________ Please note:  Patient was evaluated in Emergency Department today for the symptoms described in the history of present illness. Patient was evaluated in the context of the global  COVID-19 pandemic, which necessitated consideration that the patient might be at risk for infection with the SARS-CoV-2 virus that causes COVID-19. Institutional protocols and algorithms that pertain to the evaluation of patients at risk for COVID-19 are in a state of rapid change based on information released by regulatory bodies including the CDC and federal and state organizations. These policies and algorithms were followed during the patient's care in the ED.  Some ED evaluations and interventions may be delayed as a result of limited staffing during the pandemic.   [place] Controlled Substance Database was reviewed by me. ____________________________________________   FINAL CLINICAL IMPRESSION(S) / ED DIAGNOSES   Final diagnoses:  COVID-19  Acute saddle pulmonary embolism with acute cor pulmonale (HCC)      NEW MEDICATIONS STARTED DURING THIS VISIT:  ED Discharge Orders    None       Note:  This document was prepared using Dragon voice recognition software and may include unintentional dictation errors.    Alfred Levins, [place], MD 10/10/19 [phone]

## 2019-10-10 NOTE — ED Notes (Signed)
Pt transported to CT ?

## 2019-10-10 NOTE — Consult Note (Signed)
CARDIOLOGY CONSULT NOTE               Patient ID: Jasmine Webb MRN: [id number] DOB/AGE: 05-Sep-1955 64 y.o.  Admit date: 10/10/2019 Referring Physician Dr. Florina Ou hospitalist Primary Physician [facility] Primary Cardiologist none Reason for Consultation right heart strain hypoxemia respiratory failure  HPI: Patient is a 64 year old female known history of hypertension obesity reportedly was not vaccinated started developing dyspnea over the last few days with cough unable to bring anything up no wheezing started having fever and chills with nausea as well as diarrhea for couple days and presented to the emergency room with chest pain and cough.  Sense of taste was different patient stated she had pain slightly diminished smell sensation with having diminished appetite and was taking Pedialyte denies any body aches no blackout spells or syncope patient presents emergency room tachypneic elevated troponin Covid PCR test came back positive chest x-ray showed no significant disease with CT showed bilateral pulmonary embolus so the patient was admitted placed on lactated Ringer's as well as Decadron when there is a marrow heparin and vascular surgery was consulted for an attempt at intra-arterial thrombolysis.  Review of systems complete and found to be negative unless listed above     Past Medical History:  Diagnosis Date  . Arthritis   . Tumor    breast and pelvic    Past Surgical History:  Procedure Laterality Date  . ABDOMINAL HYSTERECTOMY  2003  . BREAST SURGERY      (Not in a hospital admission)  Social History   Socioeconomic History  . Marital status: Divorced    Spouse name: Not on file  . Number of children: Not on file  . Years of education: Not on file  . Highest education level: Not on file  Occupational History  . Not on file  Tobacco Use  . Smoking status: Never Smoker  . Smokeless tobacco: Never Used  Vaping Use  . Vaping  Use: Never used  Substance and Sexual Activity  . Alcohol use: Never  . Drug use: Never  . Sexual activity: Not Currently  Other Topics Concern  . Not on file  Social History Narrative  . Not on file   Social Determinants of Health   Financial Resource Strain: Low Risk   . Difficulty of Paying Living Expenses: Not hard at all  Food Insecurity: No Food Insecurity  . Worried About Charity fundraiser in the Last Year: Never true  . Ran Out of Food in the Last Year: Never true  Transportation Needs: No Transportation Needs  . Lack of Transportation (Medical): No  . Lack of Transportation (Non-Medical): No  Physical Activity: Insufficiently Active  . Days of Exercise per Week: 3 days  . Minutes of Exercise per Session: 30 min  Stress: No Stress Concern Present  . Feeling of Stress : Not at all  Social Connections: Unknown  . Frequency of Communication with Friends and Family: More than three times a week  . Frequency of Social Gatherings with Friends and Family: More than three times a week  . Attends Religious Services: More than 4 times per year  . Active Member of Clubs or Organizations: No  . Attends Archivist Meetings: Never  . Marital Status: Not on file  Intimate Partner Violence:   . Fear of Current or Ex-Partner: Not on file  . Emotionally Abused: Not on file  . Physically Abused: Not on file  . Sexually  Abused: Not on file    Family History  Problem Relation Age of Onset  . COPD Mother   . ALS Father   . Heart disease Sister   . Diabetes Brother   . Hypertension Brother   . Asthma Son   . Breast cancer Sister   . Colon cancer Sister   . Sinusitis Son       Review of systems complete and found to be negative unless listed above      PHYSICAL EXAM Patient lying in bed with oxygen mask appears to be in moderate respiratory distress General: Well developed, well nourished, in no acute distress HEENT:  Normocephalic and atramatic Neck:  No JVD.   Lungs: Clear bilaterally to auscultation and percussion.  Bilateral rhonchi Heart: HRRR . Normal S1 and S2 without gallops or murmurs.  Abdomen: Bowel sounds are positive, abdomen soft and non-tender  Msk:  Back normal, normal gait. Normal strength and tone for age. Extremities: No clubbing, cyanosis or edema.   Neuro: Alert and oriented X 3. Psych:  Good affect, responds appropriately  Labs:   Lab Results  Component Value Date   WBC 5.1 10/09/2019   HGB 13.1 10/09/2019   HCT 39.5 10/09/2019   MCV 86.4 10/09/2019   PLT 132 (L) 10/09/2019    Recent Labs  Lab 10/09/19 2324  NA 140  K 3.8  CL 104  CO2 25  BUN 15  CREATININE 0.69  CALCIUM 8.3*  GLUCOSE 125*   No results found for: CKTOTAL, CKMB, CKMBINDEX, TROPONINI  Lab Results  Component Value Date   CHOL 179 05/22/2019   Lab Results  Component Value Date   HDL 49 05/22/2019   Lab Results  Component Value Date   LDLCALC 105 (H) 05/22/2019   Lab Results  Component Value Date   TRIG 140 05/22/2019   No results found for: CHOLHDL No results found for: LDLDIRECT    Radiology: DG Chest 2 View  Result Date: 10/10/2019 CLINICAL DATA:  Chest pain, shortness of breath, cough, nasal congestion, and fever for 7 days. EXAM: CHEST - 2 VIEW COMPARISON:  None. FINDINGS: Cardiac enlargement. No vascular congestion or edema. No focal consolidation. No pleural effusions. No pneumothorax. Mediastinal contours appear intact. Degenerative changes in the spine. IMPRESSION: Cardiac enlargement. No evidence of active pulmonary disease. Electronically Signed   By: Lucienne Capers M.D.   On: 10/10/2019 00:22   CT Angio Chest PE W and/or Wo Contrast  Result Date: 10/10/2019 CLINICAL DATA:  Shortness of breath for several days. COVID positive. High suspicion of pulmonary embolus. EXAM: CT ANGIOGRAPHY CHEST WITH CONTRAST TECHNIQUE: Multidetector CT imaging of the chest was performed using the standard protocol during bolus administration of  intravenous contrast. Multiplanar CT image reconstructions and MIPs were obtained to evaluate the vascular anatomy. CONTRAST:  67mL OMNIPAQUE IOHEXOL 350 MG/ML SOLN COMPARISON:  None. FINDINGS: Cardiovascular: Examination is technically limited due to motion artifact. There is good opacification of the central and segmental pulmonary arteries. Multiple filling defects in the distal main pulmonary arteries, extending into bilateral upper and lower lobe lobar and segmental branches consistent with multiple pulmonary emboli. RV to LV ratio is 1.5 suggesting evidence of right heart strain. No pericardial effusions. Normal caliber thoracic aorta. No aortic dissection. Mediastinum/Nodes: Mediastinal lymph nodes are not pathologically enlarged, likely reactive. Esophagus is decompressed. Lungs/Pleura: Motion artifact limits examination. Nodular airspace infiltrates in the lung bases likely indicate COVID pneumonia. Upper Abdomen: No acute process demonstrated in the visualized upper abdomen.  Musculoskeletal: Degenerative changes in the spine. No destructive bone lesions. Review of the MIP images confirms the above findings. IMPRESSION: 1. Positive examination for multiple bilateral pulmonary emboli with CT evidence of right heart strain (RV/LV Ratio = 1.5) consistent with at least submassive (intermediate risk) PE. The presence of right heart strain has been associated with an increased risk of morbidity and mortality. 2. Nodular airspace infiltrates in the lung bases likely indicate COVID pneumonia. Critical Value/emergent results were called by telephone at the time of interpretation on 10/10/2019 at 4:36 am to provider [facility] , who verbally acknowledged these results. Electronically Signed   By: Lucienne Capers M.D.   On: 10/10/2019 04:40    EKG: Sinus tachycardia rate of around 100 nonspecific ST-T wave changes  ASSESSMENT AND PLAN:  Elevated troponins Bilateral pulmonary emboli Hypoxemia Shortness  of breath COVID-19 infection Obesity Multifocal pneumonia secondary to Covid Hypertension . Plan Agree with admit for emergent anticoagulation Submental oxygen therapy for hypoxemia Consider aggressive COVID-19 treatment Maintain anticoagulation for 3 to 6 months Echocardiogram for assessment of left ventricular function and heart strain Agree with antibiotic therapy with Zithromax and Rocephin Symptomatic therapy for cough with Mucinex Tussionex as needed Agree with antibiotic therapy when there is an air steroid therapy with Decadron consider antibodies Preop for intra-arterial thrombosis with Dr. Lazaro Arms Patient is an acceptable risk for the procedure since she has left heart strain and hypoxemia  Signed: Yolonda Kida MD 10/10/2019, 7:57 AM

## 2019-10-11 ENCOUNTER — Encounter: Payer: Self-pay | Admitting: Vascular Surgery

## 2019-10-11 LAB — FIBRIN DERIVATIVES D-DIMER (ARMC ONLY): Fibrin derivatives D-dimer (ARMC): >7500 ng/mL (FEU) — ABNORMAL HIGH (ref 0.00–499.00)

## 2019-10-11 LAB — CBC WITH DIFFERENTIAL/PLATELET
Abs Immature Granulocytes: 0.06 10*3/uL (ref 0.00–0.07)
Basophils Absolute: 0 10*3/uL (ref 0.0–0.1)
Basophils Relative: 0 %
Eosinophils Absolute: 0 10*3/uL (ref 0.0–0.5)
Eosinophils Relative: 0 %
HCT: 34.3 % — ABNORMAL LOW (ref 36.0–46.0)
Hemoglobin: 11.8 g/dL — ABNORMAL LOW (ref 12.0–15.0)
Immature Granulocytes: 1 %
Lymphocytes Relative: 27 %
Lymphs Abs: 1.7 10*3/uL (ref 0.7–4.0)
MCH: 28.9 pg (ref 26.0–34.0)
MCHC: 34.4 g/dL (ref 30.0–36.0)
MCV: 84.1 fL (ref 80.0–100.0)
Monocytes Absolute: 0.7 10*3/uL (ref 0.1–1.0)
Monocytes Relative: 11 %
Neutro Abs: 3.8 10*3/uL (ref 1.7–7.7)
Neutrophils Relative %: 61 %
Platelets: 137 10*3/uL — ABNORMAL LOW (ref 150–400)
RBC: 4.08 MIL/uL (ref 3.87–5.11)
RDW: 13.3 % (ref 11.5–15.5)
WBC: 6.2 10*3/uL (ref 4.0–10.5)
nRBC: 0 % (ref 0.0–0.2)

## 2019-10-11 LAB — COMPREHENSIVE METABOLIC PANEL
ALT: 26 U/L (ref 0–44)
AST: 36 U/L (ref 15–41)
Albumin: 3 g/dL — ABNORMAL LOW (ref 3.5–5.0)
Alkaline Phosphatase: 50 U/L (ref 38–126)
Anion gap: 8 (ref 5–15)
BUN: 16 mg/dL (ref 8–23)
CO2: 27 mmol/L (ref 22–32)
Calcium: 8 mg/dL — ABNORMAL LOW (ref 8.9–10.3)
Chloride: 107 mmol/L (ref 98–111)
Creatinine, Ser: 0.59 mg/dL (ref 0.44–1.00)
GFR calc Af Amer: >60 mL/min (ref >60)
GFR calc non Af Amer: >60 mL/min (ref >60)
Glucose, Bld: 127 mg/dL — ABNORMAL HIGH (ref 70–99)
Potassium: 3.5 mmol/L (ref 3.5–5.1)
Sodium: 142 mmol/L (ref 135–145)
Total Bilirubin: 0.9 mg/dL (ref 0.3–1.2)
Total Protein: 5.7 g/dL — ABNORMAL LOW (ref 6.5–8.1)

## 2019-10-11 LAB — FERRITIN: Ferritin: 256 ng/mL (ref 11–307)

## 2019-10-11 LAB — HEPARIN LEVEL (UNFRACTIONATED): Heparin Unfractionated: 0.26 IU/mL — ABNORMAL LOW (ref 0.30–0.70)

## 2019-10-11 LAB — MAGNESIUM: Magnesium: 1.7 mg/dL (ref 1.7–2.4)

## 2019-10-11 LAB — TROPONIN I (HIGH SENSITIVITY): Troponin I (High Sensitivity): 162 ng/L (ref <18)

## 2019-10-11 LAB — LACTATE DEHYDROGENASE: LDH: 178 U/L (ref 98–192)

## 2019-10-11 LAB — C-REACTIVE PROTEIN: CRP: 1.4 mg/dL — ABNORMAL HIGH (ref <1.0)

## 2019-10-11 MED ORDER — APIXABAN 5 MG PO TABS
10.0000 mg | ORAL_TABLET | Freq: Two times a day (BID) | ORAL | Status: DC
  Administered 2019-10-11 – 2019-10-13 (×5): 10 mg via ORAL
  Filled 2019-10-11 (×5): qty 2

## 2019-10-11 MED ORDER — APIXABAN 5 MG PO TABS: 5.0000 mg | ORAL_TABLET | Freq: Two times a day (BID) | ORAL | Status: DC

## 2019-10-11 MED ORDER — CEFAZOLIN SODIUM-DEXTROSE 2-4 GM/100ML-% IV SOLN
2.0000 g | INTRAVENOUS | Status: AC
  Filled 2019-10-11: qty 100

## 2019-10-11 MED ORDER — HYDROCODONE-ACETAMINOPHEN 5-325 MG PO TABS: 1.0000 | ORAL_TABLET | Freq: Four times a day (QID) | ORAL | Status: DC | PRN

## 2019-10-11 NOTE — Telephone Encounter (Signed)
FYI Pt covid positive.

## 2019-10-11 NOTE — Telephone Encounter (Signed)
Noted, yes she is admitted in hospital at this time.

## 2019-10-11 NOTE — Progress Notes (Signed)
[place] for Apixaban Indication: PE  Allergies  Allergen Reactions  . Ibuprofen     Told to avoid ibuprofen related products by doctor.    Patient Measurements: Height: 5\' 3"  (160 cm) Weight: (!) 139.2 kg (306 lb 14.4 oz) IBW/kg (Calculated) : 52.4  Vital Signs: Temp: 97.7 F (36.5 C) (09/10 1119) Temp Source: Oral (09/10 1119) BP: 146/77 (09/10 1119) Pulse Rate: 67 (09/10 1119)  Labs: Recent Labs    10/09/19 2324 10/10/19 0255 10/10/19 0301 10/10/19 0535 10/10/19 0851 10/10/19 1643 10/11/19 0532  HGB 13.1  --   --   --   --   --  11.8*  HCT 39.5  --   --   --   --   --  34.3*  PLT 132*  --   --   --   --   --  137*  APTT  --   --  35  --   --   --   --   LABPROT  --   --  13.1  --   --   --   --   INR  --   --  1.0  --   --   --   --   HEPARINUNFRC  --   --   --   --  0.54 0.66 0.26*  CREATININE 0.69  --   --   --   --   --  0.59  TROPONINIHS 657*   < >  --  423* 334*  --  162*   < > = values in this interval not displayed.    Estimated Creatinine Clearance: 96.4 mL/min (by C-G formula based on SCr of 0.59 mg/dL).   Medical History: Past Medical History:  Diagnosis Date  . Arthritis   . Tumor    breast and pelvic    Medications:  Scheduled:  . apixaban  10 mg Oral BID   Followed by  . [START ON 10/18/2019] apixaban  5 mg Oral BID  . vitamin C  500 mg Oral Daily  . aspirin EC  81 mg Oral Daily  . atorvastatin  40 mg Oral Daily  . baricitinib  4 mg Oral Daily  . cholecalciferol  1,000 Units Oral Daily  . famotidine  20 mg Oral BID  . fluticasone  2 spray Each Nare Daily  . guaiFENesin  600 mg Oral BID  . losartan  100 mg Oral Daily  . methylPREDNISolone (SOLU-MEDROL) injection  125 mg Intravenous Q12H   Followed by  . [START ON 10/13/2019] predniSONE  50 mg Oral Daily  . metoprolol tartrate  12.5 mg Oral BID  . zinc sulfate  220 mg Oral Daily    Assessment: Patient is a 64yo female who was admitted for  SOB w/ nasal congestion after coming from home w/ sick children. Is COVID PCR positive on admission. Per chart review, patient did no have any anticoagulation PTA. Patient was started on a heparin drip for NSTEMI management (intial trop 657 w/ EKG showing ST abnormalities). Doppler negative for DVT. Chest CT showed bilateral PE. Heparin drip was discontinued and pharmacy has been consulted for Apixaban dosing.   Baseline CBC WNL except plts have dropped by roughly 45% since last draw of 192 4 months ago.    Goal of Therapy:  Monitor platelets by anticoagulation protocol: Yes   Plan:  Start Apixaban 10mg  BID x7 days, then 5mg  BID thereafter Monitor CBC per protocol  Sherilyn Banker, PharmD Pharmacy Resident  10/11/2019 1:12 PM

## 2019-10-11 NOTE — Plan of Care (Signed)
  Problem: Clinical Measurements: Goal: Respiratory complications will improve Outcome: Progressing   

## 2019-10-11 NOTE — Progress Notes (Signed)
[facility] Daily Progress Note   Subjective: 10/10/19:             1.  Contrast injection right heart             2.  Thrombolysis with 8 mg of TPA in the bilateral pulmonary arteries             3.  Mechanical thrombectomy the right, right middle lobe, right upper lobe, and right lower lobe pulmonary arteries as well as the left main, upper, and lower lobe pulmonary arteries pulmonary arteries             4.  Selective catheter placement right upper lobe, middle lobe, and lower lobe pulmonary arteries             5.  Selective catheter placement left upper and lower lobe pulmonary arteries  Patient states improvement in her SOB. No issues overnight.   Objective: Vitals:   10/10/19 2003 10/11/19 0528 10/11/19 0833 10/11/19 1119  BP: (!) 141/76 119/63 137/79 (!) 146/77  Pulse: 65 (!) 54 (!) 57 67  Resp:  18 19 20   Temp: (!) 97.5 F (36.4 C) 97.6 F (36.4 C) 97.6 F (36.4 C) 97.7 F (36.5 C)  TempSrc: Oral Oral Oral Oral  SpO2: 99% 99% 99% 98%  Weight: (!) 138.8 kg (!) 139.2 kg    Height:        Intake/Output Summary (Last 24 hours) at 10/11/2019 1258 Last data filed at 10/11/2019 1119 Gross per 24 hour  Intake --  Output 0 ml  Net 0 ml   Physical Exam: A&Ox3, NAD CV: RRR Pulmonary: CTA Bilaterally, On nasal cannula, non-labored Abdomen: Soft, Non-tender, Non-distended Right Groin:  Access Site: Clean, dry and intact Vascular: warm distally to toes   Laboratory: CBC    Component Value Date/Time   WBC 6.2 10/11/2019 0532   HGB 11.8 (L) 10/11/2019 0532   HGB 13.6 05/22/2019 1542   HCT 34.3 (L) 10/11/2019 0532   HCT 40.4 05/22/2019 1542   PLT 137 (L) 10/11/2019 0532   PLT 192 05/22/2019 1542   BMET    Component Value Date/Time   NA 142 10/11/2019 0532   NA 144 08/16/2019 1652   K 3.5 10/11/2019 0532   CL 107 10/11/2019 0532   CO2 27 10/11/2019 0532   GLUCOSE 127 (H) 10/11/2019 0532   BUN 16 10/11/2019 0532   BUN 16 08/16/2019 1652    CREATININE 0.59 10/11/2019 0532   CALCIUM 8.0 (L) 10/11/2019 0532   GFRNONAA >60 10/11/2019 0532   GFRAA >60 10/11/2019 0532   Assessment/Planning: The patient is a 64 year old female with COVID who presented with PE, s/p pulmonary thrombectomy / thrombolysis - POD#1  1) Patient states improvement in breathing 2) No DVT on duplex 3) Heparin stopped, Eliquis started 4) OOB / Ambulation 5) Vascular to sign off at this time, will see in office in one month  Discussed with Dr. Ellis Parents Cavon Nicolls PA-C 10/11/2019 12:58 PM

## 2019-10-11 NOTE — Progress Notes (Signed)
[place] for heparin Indication: chest pain/ACS  Allergies  Allergen Reactions  . Ibuprofen     Told to avoid ibuprofen related products by doctor.    Patient Measurements: Height: 5\' 3"  (160 cm) Weight: (!) 139.2 kg (306 lb 14.4 oz) IBW/kg (Calculated) : 52.4 Heparin Dosing Weight: 86.82 kg  Vital Signs: Temp: 97.6 F (36.4 C) (09/10 0528) Temp Source: Oral (09/10 0528) BP: 119/63 (09/10 0528) Pulse Rate: 54 (09/10 0528)  Labs: Recent Labs    10/09/19 2324 10/10/19 0255 10/10/19 0301 10/10/19 0535 10/10/19 0851 10/10/19 1643 10/11/19 0532  HGB 13.1  --   --   --   --   --  11.8*  HCT 39.5  --   --   --   --   --  34.3*  PLT 132*  --   --   --   --   --  137*  APTT  --   --  35  --   --   --   --   LABPROT  --   --  13.1  --   --   --   --   INR  --   --  1.0  --   --   --   --   HEPARINUNFRC  --   --   --   --  0.54 0.66 0.26*  CREATININE 0.69  --   --   --   --   --  0.59  TROPONINIHS 657*   < >  --  423* 334*  --  162*   < > = values in this interval not displayed.    Estimated Creatinine Clearance: 96.4 mL/min (by C-G formula based on SCr of 0.59 mg/dL).   Medical History: Past Medical History:  Diagnosis Date  . Arthritis   . Tumor    breast and pelvic    Medications:  Scheduled:  . vitamin C  500 mg Oral Daily  . aspirin EC  81 mg Oral Daily  . atorvastatin  40 mg Oral Daily  . baricitinib  4 mg Oral Daily  . cholecalciferol  1,000 Units Oral Daily  . famotidine  20 mg Oral BID  . fluticasone  2 spray Each Nare Daily  . guaiFENesin  600 mg Oral BID  . losartan  100 mg Oral Daily  . methylPREDNISolone (SOLU-MEDROL) injection  125 mg Intravenous Q12H   Followed by  . [START ON 10/13/2019] predniSONE  50 mg Oral Daily  . metoprolol tartrate  12.5 mg Oral BID  . zinc sulfate  220 mg Oral Daily    Assessment: Patient admitted for SOB w/ nasal congestion after coming from home w/ sick children. Is COVID  PCR positive on admission. Patient's initial trop of 657 w/ EKG showing ST abnormalities. Baseline CBC WNL except plts have dropped by roughly 45% since last draw of 192 4 months ago. APTT/INR pending. No anticoagulation PTA. Patient is being started on heparin drip for NSTEMI management.  09/09 @ 0851 HL 0.54, therapeutic x 1 09/09 @ 1643 HL 0.66 therapeutic x 2  Goal of Therapy:  Heparin level 0.3-0.7 units/ml Monitor platelets by anticoagulation protocol: Yes   Plan:  09/10 @ 0530 HL 0.26 subtherapeutic. Heparin drip was stopped for 2.5 hrs will recheck HL at 1300 and continue to monitor.  Tobie Lords, PharmD, BCPS Clinical Pharmacist 10/11/2019 6:58 AM

## 2019-10-11 NOTE — Progress Notes (Signed)
[facility]    SUBJECTIVE: Patient feeling much better still on supplemental oxygen not as short of breath no significant palpitations or tachycardia recovering nicely still has not gotten out of bed yet status post vascular procedure for PE continue anticoagulation.  Supportive limited care for cardiac issues at this point   Vitals:   10/10/19 2003 10/11/19 0528 10/11/19 0833 10/11/19 1119  BP: (!) 141/76 119/63 137/79 (!) 146/77  Pulse: 65 (!) 54 (!) 57 67  Resp:  18 19 20   Temp: (!) 97.5 F (36.4 C) 97.6 F (36.4 C) 97.6 F (36.4 C) 97.7 F (36.5 C)  TempSrc: Oral Oral Oral Oral  SpO2: 99% 99% 99% 98%  Weight: (!) 138.8 kg (!) 139.2 kg    Height:         Intake/Output Summary (Last 24 hours) at 10/11/2019 1311 Last data filed at 10/11/2019 1119 Gross per 24 hour  Intake --  Output 0 ml  Net 0 ml      PHYSICAL EXAM  General: Well developed, well nourished, in no acute distress HEENT:  Normocephalic and atramatic Neck:  No JVD.  Lungs: Clear bilaterally to auscultation and percussion. Heart: HRRR . Normal S1 and S2 without gallops or murmurs.  Abdomen: Bowel sounds are positive, abdomen soft and non-tender  Msk:  Back normal, normal gait. Normal strength and tone for age. Extremities: No clubbing, cyanosis or edema.   Neuro: Alert and oriented X 3. Psych:  Good affect, responds appropriately   LABS: Basic Metabolic Panel: Recent Labs    10/09/19 2324 10/11/19 0532  NA 140 142  K 3.8 3.5  CL 104 107  CO2 25 27  GLUCOSE 125* 127*  BUN 15 16  CREATININE 0.69 0.59  CALCIUM 8.3* 8.0*  MG  --  1.7   Liver Function Tests: Recent Labs    10/11/19 0532  AST 36  ALT 26  ALKPHOS 50  BILITOT 0.9  PROT 5.7*  ALBUMIN 3.0*   No results for input(s): LIPASE, AMYLASE in the last 72 hours. CBC: Recent Labs    10/09/19 2324 10/11/19 0532  WBC 5.1 6.2  NEUTROABS  --  3.8  HGB 13.1 11.8*  HCT 39.5 34.3*  MCV 86.4 84.1  PLT 132* 137*   Cardiac  Enzymes: No results for input(s): CKTOTAL, CKMB, CKMBINDEX, TROPONINI in the last 72 hours. BNP: Invalid input(s): POCBNP D-Dimer: No results for input(s): DDIMER in the last 72 hours. Hemoglobin A1C: No results for input(s): HGBA1C in the last 72 hours. Fasting Lipid Panel: Recent Labs    10/10/19 0851  CHOL 139  HDL 36*  LDLCALC 89  TRIG 71  CHOLHDL 3.9   Thyroid Function Tests: No results for input(s): TSH, T4TOTAL, T3FREE, THYROIDAB in the last 72 hours.  Invalid input(s): FREET3 Anemia Panel: Recent Labs    10/11/19 0532  FERRITIN 256    DG Chest 2 View  Result Date: 10/10/2019 CLINICAL DATA:  Chest pain, shortness of breath, cough, nasal congestion, and fever for 7 days. EXAM: CHEST - 2 VIEW COMPARISON:  None. FINDINGS: Cardiac enlargement. No vascular congestion or edema. No focal consolidation. No pleural effusions. No pneumothorax. Mediastinal contours appear intact. Degenerative changes in the spine. IMPRESSION: Cardiac enlargement. No evidence of active pulmonary disease. Electronically Signed   By: Lucienne Capers M.D.   On: 10/10/2019 00:22   CT Angio Chest PE W and/or Wo Contrast  Result Date: 10/10/2019 CLINICAL DATA:  Shortness of breath for several days. COVID positive. High  suspicion of pulmonary embolus. EXAM: CT ANGIOGRAPHY CHEST WITH CONTRAST TECHNIQUE: Multidetector CT imaging of the chest was performed using the standard protocol during bolus administration of intravenous contrast. Multiplanar CT image reconstructions and MIPs were obtained to evaluate the vascular anatomy. CONTRAST:  19mL OMNIPAQUE IOHEXOL 350 MG/ML SOLN COMPARISON:  None. FINDINGS: Cardiovascular: Examination is technically limited due to motion artifact. There is good opacification of the central and segmental pulmonary arteries. Multiple filling defects in the distal main pulmonary arteries, extending into bilateral upper and lower lobe lobar and segmental branches consistent with multiple  pulmonary emboli. RV to LV ratio is 1.5 suggesting evidence of right heart strain. No pericardial effusions. Normal caliber thoracic aorta. No aortic dissection. Mediastinum/Nodes: Mediastinal lymph nodes are not pathologically enlarged, likely reactive. Esophagus is decompressed. Lungs/Pleura: Motion artifact limits examination. Nodular airspace infiltrates in the lung bases likely indicate COVID pneumonia. Upper Abdomen: No acute process demonstrated in the visualized upper abdomen. Musculoskeletal: Degenerative changes in the spine. No destructive bone lesions. Review of the MIP images confirms the above findings. IMPRESSION: 1. Positive examination for multiple bilateral pulmonary emboli with CT evidence of right heart strain (RV/LV Ratio = 1.5) consistent with at least submassive (intermediate risk) PE. The presence of right heart strain has been associated with an increased risk of morbidity and mortality. 2. Nodular airspace infiltrates in the lung bases likely indicate COVID pneumonia. Critical Value/emergent results were called by telephone at the time of interpretation on 10/10/2019 at 4:36 am to provider [facility] , who verbally acknowledged these results. Electronically Signed   By: Lucienne Capers M.D.   On: 10/10/2019 04:40   PERIPHERAL VASCULAR CATHETERIZATION  Result Date: 10/10/2019 See op note  US Venous Img Lower Bilateral (DVT)  Result Date: 10/10/2019 CLINICAL DATA:  Known pulmonary emboli EXAM: BILATERAL LOWER EXTREMITY VENOUS DOPPLER ULTRASOUND TECHNIQUE: Gray-scale sonography with graded compression, as well as color Doppler and duplex ultrasound were performed to evaluate the lower extremity deep venous systems from the level of the common femoral vein and including the common femoral, femoral, profunda femoral, popliteal and calf veins including the posterior tibial, peroneal and gastrocnemius veins when visible. The superficial great saphenous vein was also interrogated.  Spectral Doppler was utilized to evaluate flow at rest and with distal augmentation maneuvers in the common femoral, femoral and popliteal veins. COMPARISON:  None. FINDINGS: RIGHT LOWER EXTREMITY Common Femoral Vein: No evidence of thrombus. Normal compressibility, respiratory phasicity and response to augmentation. Saphenofemoral Junction: No evidence of thrombus. Normal compressibility and flow on color Doppler imaging. Profunda Femoral Vein: No evidence of thrombus. Normal compressibility and flow on color Doppler imaging. Femoral Vein: No evidence of thrombus. Normal compressibility, respiratory phasicity and response to augmentation. Popliteal Vein: No evidence of thrombus. Normal compressibility, respiratory phasicity and response to augmentation. Calf Veins: Posterior tibial veins are within normal limits. Peroneal veins are not visualized. Superficial Great Saphenous Vein: No evidence of thrombus. Normal compressibility. Venous Reflux:  None. Other Findings:  None. LEFT LOWER EXTREMITY Common Femoral Vein: No evidence of thrombus. Normal compressibility, respiratory phasicity and response to augmentation. Saphenofemoral Junction: No evidence of thrombus. Normal compressibility and flow on color Doppler imaging. Profunda Femoral Vein: No evidence of thrombus. Normal compressibility and flow on color Doppler imaging. Femoral Vein: No evidence of thrombus. Normal compressibility, respiratory phasicity and response to augmentation. Popliteal Vein: No evidence of thrombus. Normal compressibility, respiratory phasicity and response to augmentation. Calf Veins: Posterior tibial veins are within normal limits. Peroneal veins are not visualized.  Superficial Great Saphenous Vein: No evidence of thrombus. Normal compressibility. Venous Reflux:  None. Other Findings:  None. IMPRESSION: No evidence of deep venous thrombosis in either lower extremity. Electronically Signed   By: Inez Catalina M.D.   On: 10/10/2019 16:16     ECHOCARDIOGRAM COMPLETE  Result Date: 10/10/2019    ECHOCARDIOGRAM REPORT   Patient Name:   Jasmine Webb Date of Exam: 10/10/2019 Medical Rec #:  [id number]   Height:       64.0 in Accession #:    [id number]  Weight:       297.6 lb Date of Birth:  [birth year]  BSA:          2.316 m Patient Age:    34 years    BP:           155/86 mmHg Patient Gender: F           HR:           80 bpm. Exam Location:  [facility] Procedure: 2D Echo, Cardiac Doppler and Color Doppler Indications:     Pulmonary embolus 415.19  History:         Patient has no prior history of Echocardiogram examinations. No                  cardiac history listed in chart.  Sonographer:     Sherrie Sport RDCS (AE) Referring Phys:  [id number] Arvella Merles MANSY Diagnosing Phys: Yolonda Kida MD  Sonographer Comments: Technically difficult study due to poor echo windows. IMPRESSIONS  1. Left ventricular ejection fraction, by estimation, is 55 to 60%. The left ventricle has normal function. The left ventricle has no regional wall motion abnormalities. Left ventricular diastolic parameters are consistent with Grade II diastolic dysfunction (pseudonormalization).  2. Right ventricular systolic function is normal. The right ventricular size is normal.  3. The mitral valve is grossly normal. Trivial mitral valve regurgitation. Moderate mitral annular calcification.  4. The aortic valve is grossly normal. Aortic valve regurgitation is not visualized. FINDINGS  Left Ventricle: Left ventricular ejection fraction, by estimation, is 55 to 60%. The left ventricle has normal function. The left ventricle has no regional wall motion abnormalities. The left ventricular internal cavity size was normal in size. There is  no left ventricular hypertrophy. Left ventricular diastolic parameters are consistent with Grade II diastolic dysfunction (pseudonormalization). Right Ventricle: The right ventricular size is normal. No increase in right ventricular wall thickness. Right ventricular  systolic function is normal. Left Atrium: Left atrial size was normal in size. Right Atrium: Right atrial size was normal in size. Pericardium: There is no evidence of pericardial effusion. Mitral Valve: The mitral valve is grossly normal. Moderate mitral annular calcification. Trivial mitral valve regurgitation. Tricuspid Valve: The tricuspid valve is normal in structure. Tricuspid valve regurgitation is not demonstrated. Aortic Valve: The aortic valve is grossly normal. Aortic valve regurgitation is not visualized. Aortic valve mean gradient measures 7.3 mmHg. Aortic valve peak gradient measures 11.6 mmHg. Aortic valve area, by VTI measures 3.35 cm. Pulmonic Valve: The pulmonic valve was normal in structure. Pulmonic valve regurgitation is not visualized. Aorta: The aortic arch was not well visualized. IAS/Shunts: No atrial level shunt detected by color flow Doppler.  LEFT VENTRICLE PLAX 2D LVIDd:         3.93 cm  Diastology LVIDs:         2.69 cm  LV e' medial:    4.68 cm/s LV PW:  1.20 cm  LV E/e' medial:  14.1 LV IVS:        1.87 cm  LV e' lateral:   5.00 cm/s LVOT diam:     2.00 cm  LV E/e' lateral: 13.2 LV SV:         112 LV SV Index:   48 LVOT Area:     3.14 cm  RIGHT VENTRICLE RV Basal diam:  3.41 cm RV S prime:     17.30 cm/s TAPSE (M-mode): 3.6 cm LEFT ATRIUM             Index       RIGHT ATRIUM           Index LA diam:        3.70 cm 1.60 cm/m  RA Area:     25.40 cm LA Vol (A2C):   73.1 ml 31.56 ml/m RA Volume:   85.40 ml  36.87 ml/m LA Vol (A4C):   76.5 ml 33.02 ml/m LA Biplane Vol: 77.6 ml 33.50 ml/m  AORTIC VALVE                    PULMONIC VALVE AV Area (Vmax):    2.48 cm     PV Vmax:        0.62 m/s AV Area (Vmean):   2.52 cm     PV Peak grad:   1.5 mmHg AV Area (VTI):     3.35 cm     RVOT Peak grad: 3 mmHg AV Vmax:           170.00 cm/s AV Vmean:          119.000 cm/s AV VTI:            0.334 m AV Peak Grad:      11.6 mmHg AV Mean Grad:      7.3 mmHg LVOT Vmax:         134.00 cm/s  LVOT Vmean:        95.600 cm/s LVOT VTI:          0.357 m LVOT/AV VTI ratio: 1.07  AORTA Ao Root diam: 3.30 cm MITRAL VALVE                TRICUSPID VALVE MV Area (PHT): 3.77 cm     TR Peak grad:   11.7 mmHg MV Decel Time: 201 msec     TR Vmax:        171.00 cm/s MV E velocity: 66.00 cm/s MV A velocity: 112.00 cm/s  SHUNTS MV E/A ratio:  0.59         Systemic VTI:  0.36 m                             Systemic Diam: 2.00 cm Terique Kawabata D Aurelio Mccamy MD Electronically signed by Yolonda Kida MD Signature Date/Time: 10/10/2019/5:11:00 PM    Final      Echo borderline reduced left ventricular function with evidence of RV strain  TELEMETRY: Normal sinus rhythm nonspecific T2 changes:  ASSESSMENT AND PLAN:  Principal Problem:   Acute hypoxemic respiratory failure due to COVID-19 [facility]) Active Problems:   Essential hypertension   Bilateral pulmonary embolism (HCC)   Elevated troponin level    Agree with admit to telemetry to follow-up EKGs and troponins Continue supplemental oxygen as necessary Maintain anticoagulation for PE DVT Continue to control blood pressure as necessary Agree with treatment for COVID-19 Troponins  suggestive of demand ischemia and not primarily cardiac Recommend supportive care overall for Covid and PE   Yolonda Kida, MD 10/11/2019 1:11 PM

## 2019-10-11 NOTE — Plan of Care (Signed)
°  Problem: Health Behavior/Discharge Planning: Goal: Ability to manage health-related needs will improve Outcome: Progressing   Problem: Clinical Measurements: Goal: Respiratory complications will improve Outcome: Progressing   Problem: Safety: Goal: Ability to remain free from injury will improve Outcome: Progressing

## 2019-10-11 NOTE — Progress Notes (Signed)
PROGRESS NOTE    Jasmine Webb  YBO:[id number] DOB: 07-18-1955 DOA: 10/10/2019 PCP: Venita Lick, NP   Brief Narrative:  Jasmine Webb  is a 64 y.o. Caucasian female with a known history of hypertension and osteoarthritis, who presented to the emergency room with acute onset of worsening dyspnea with associated cough with inability to expectorate without wheezing.  She has been having fever and chills and admitted to nausea as well as diarrhea for couple of days.  She admitted to chest pain with cough.  Her sense of taste has been different.  She stated that it has been better with slightly diminished smell sensation.  She has been having diminished appetite and has been taking Pedialyte.  She denies any body aches.  No dysuria, oliguria or hematuria or flank pain.  She has not been vaccinated against COVID-19.  She stated that she has been busy taking care of her 2 grandchildren.  Upon presentation  to the emergency room, blood pressure was 142/95 with a heart rate of 108 respiratory rate of 22.  Labs revealed a BNP of 66.1 and high-sensitivity troponin I was 657 and later 557.  PT was 35 with INR 1 PT 13.1 COVID-19 PCR came back positive.  Two-view chest x-ray showed cardiomegaly with no acute cardiopulmonary disease.Chest CT angiogram revealed bilateral multiple PE with evidence of right ventricular strain consistent with at least submassive PE.  It also showed nodular airspace infiltrates in the lung bases likely indicating COVID-19 pneumonia.  The patient was given 1 L bolus of IV lactated Ringer followed by 125 mL/h, and 25 mg IV Solu-Medrol, IV remdesivir and IV heparin bolus followed by drip.  Contact was made with Dr. Lucky Cowboy with vascular surgery and he will attempt intra-arterial thrombolysis later today.  She will be admitted to a progressive unit bed for further evaluation and management.   Assessment & Plan:   Principal Problem:   Acute hypoxemic respiratory failure due to COVID-19  [facility]) Active Problems:   Bilateral pulmonary embolism (HCC)   Elevated troponin level   Essential hypertension 1. Acute hypoxemic respiratory failure secondary to COVID-19. -The patient will be admitted to a medically monitored isolation bed. -O2 protocol will be followed to keep O2 saturation above 93.  2.   Multifocal pneumonia secondary to COVID-19. -The patient will be admitted to an isolation monitored bed with droplet and contact precautions. -Given multifocal pneumonia we will empirically place the patient on IV Rocephin and Zithromax for possible bacterial superinfection only with elevated Procalcitonin. -The patient will be placed on scheduled Mucinex and as needed Tussionex. -We will avoid nebulization as much as we can, give bronchodilator MDI if needed, and with deterioration of oxygenation try to avoid BiPAP/CPAP if possible.  -Will obtain sputum Gram stain culture and sensitivity and follow blood cultures. -O2 protocol will be followed. -We will follow CRP, ferritin, LDH and D-dimer. -Will follow manual differential for ANC/ALC ratio as well as follow troponin I and daily CBC with manual differential and CMP. - Will place the patient on IV Remdesivir and IV steroid therapy with Decadron with elevated inflammatory markers. -The patient will be placed on vitamin D3, vitamin C, zinc sulfate, p.o. Pepcid and aspirin. -I discussed Baricitinib with the patient and she agreed to proceed with it.  3.  Acute bilateral submassive pulmonary embolism with right ventricular strain with elevated troponin I and chest pain. -The patient will be placed on IV heparin. -We will obtain a 2D echo for assessment in a.m. -Bilateral  lower extremity venous Doppler to rule out DVT as a source for her PE. -Vascular surgery consult was ordered. -Dr. Lucky Cowboy is aware about the patient and will be planning an intra-arterial thrombosis later today. -We will follow serial troponin I's and obtain a 2D  echo as mentioned above. -Given associated chest pain will place on as needed sublingual nitroglycerin and morphine sulfate. -She will also be placed on beta-blocker therapy and statin therapy.  Aspirin was held off given history of NSAIDs allergy. Pt chagnged to eliquis.   4.  Essential hypertension. -We will continue Cozaar.  5.  DVT prophylaxis. -The patient will be on IV heparin.  DVT prophylaxis: iv heparin Code Status: Full Code Family Communication: None at bedside pt stays with grandchildren.  Disposition Plan: STR.  Status is: Inpatient Remains inpatient appropriate because:Inpatient level of care appropriate due to severity of illness   Dispo: The patient is from: STR              Anticipated d/c is to: Home              Anticipated d/c date is: > 3 days              Patient currently is not medically stable to d/c.   Consultants:  -Cardiology-Dr.Callwood.  -Vascular -Dr.Dew.  Procedures:  -Pulmonary Angiogram, catheter directed thrombolysis and thrombectomy.   Subjective: Pt admitted with chest pain and sob,found to have covid-19 pna and BL  extensive submassive PE with right heart strain. Vascular sx consulted and pt is s/p cath directed thrombolysis and thrombectomy. Cardiology consulted as well. We appreciate consultant management and care.  Objective: Vitals:   10/10/19 2003 10/11/19 0528 10/11/19 0833 10/11/19 1119  BP: (!) 141/76 119/63 137/79 (!) 146/77  Pulse: 65 (!) 54 (!) 57 67  Resp:  18 19 20   Temp: (!) 97.5 F (36.4 C) 97.6 F (36.4 C) 97.6 F (36.4 C) 97.7 F (36.5 C)  TempSrc: Oral Oral Oral Oral  SpO2: 99% 99% 99% 98%  Weight: (!) 138.8 kg (!) 139.2 kg    Height:        Intake/Output Summary (Last 24 hours) at 10/11/2019 1443 Last data filed at 10/11/2019 1119 Gross per 24 hour  Intake --  Output 0 ml  Net 0 ml   Filed Weights   10/10/19 1809 10/10/19 2003 10/11/19 0528  Weight: 136.1 kg (!) 138.8 kg (!) 139.2 kg    Examination: Blood pressure (!) 146/77, pulse 67, temperature 97.7 F (36.5 C), temperature source Oral, resp. rate 20, height 5\' 3"  (1.6 m), weight (!) 139.2 kg, SpO2 98 %.  General exam: Appears calm and comfortable  Respiratory system: Clear to auscultation. Respiratory effort normal. Cardiovascular system: S1 & S2 heard, RRR. No JVD, murmurs, rubs, gallops or clicks. No pedal edema. Gastrointestinal system: Abdomen is nondistended, soft and nontender. No organomegaly or masses felt. Normal bowel sounds heard. Central nervous system: Alert and oriented. No focal neurological deficits. Extremities: Symmetric 5 x 5 power. Skin: No rashes, lesions or ulcers Psychiatry: Judgement and insight appear normal. Mood & affect appropriate.   Data Reviewed: I have personally reviewed following labs and imaging studies  No intake/output data recorded. No intake/output data recorded. Lab Results  Component Value Date   CREATININE 0.59 10/11/2019   CREATININE 0.69 10/09/2019   CREATININE 0.73 08/16/2019   CBC: Recent Labs  Lab 10/09/19 2324 10/11/19 0532  WBC 5.1 6.2  NEUTROABS  --  3.8  HGB 13.1 11.8*  HCT 39.5 34.3*  MCV 86.4 84.1  PLT 132* 315*   Basic Metabolic Panel: Recent Labs  Lab 10/09/19 2324 10/11/19 0532  NA 140 142  K 3.8 3.5  CL 104 107  CO2 25 27  GLUCOSE 125* 127*  BUN 15 16  CREATININE 0.69 0.59  CALCIUM 8.3* 8.0*  MG  --  1.7   GFR: Estimated Creatinine Clearance: 96.4 mL/min (by C-G formula based on SCr of 0.59 mg/dL). Liver Function Tests: Recent Labs  Lab 10/11/19 0532  AST 36  ALT 26  ALKPHOS 50  BILITOT 0.9  PROT 5.7*  ALBUMIN 3.0*   Coagulation Profile: Recent Labs  Lab 10/10/19 0301  INR 1.0   Lipid Profile: Recent Labs    10/10/19 0851  CHOL 139  HDL 36*  LDLCALC 89  TRIG 71  CHOLHDL 3.9   Anemia Panel: Recent Labs    10/10/19 0535 10/11/19 0532  FERRITIN 251 256   Sepsis Labs: Recent Labs  Lab 10/10/19 0535   PROCALCITON <0.10    Recent Results (from the past 240 hour(s))  SARS Coronavirus 2 by RT PCR (hospital order, performed in [place] hospital lab) Nasopharyngeal Nasopharyngeal Swab     Status: Abnormal   Collection Time: 10/09/19 11:24 PM   Specimen: Nasopharyngeal Swab  Result Value Ref Range Status   SARS Coronavirus 2 POSITIVE (A) NEGATIVE Final    Comment: RESULT CALLED TO, READ BACK BY AND VERIFIED WITH: Mosetta Pigeon RN [phone] 10/10/19 HNM (NOTE) SARS-CoV-2 target nucleic acids are DETECTED  SARS-CoV-2 RNA is generally detectable in upper respiratory specimens  during the acute phase of infection.  Positive results are indicative  of the presence of the identified virus, but do not rule out bacterial infection or co-infection with other pathogens not detected by the test.  Clinical correlation with patient history and  other diagnostic information is necessary to determine patient infection status.  The expected result is negative.  Fact Sheet for Patients:   StrictlyIdeas.no   Fact Sheet for Healthcare Providers:   BankingDealers.co.za    This test is not yet approved or cleared by the [place] FDA and  has been authorized for detection and/or diagnosis of SARS-CoV-2 by FDA under an Emergency Use Authorization (EUA).  This EUA will remain in effect (meaning this test  can be used) for the duration of  the COVID-19 declaration under Section 564(b)(1) of the Act, 21 U.S.C. section 360-bbb-3(b)(1), unless the authorization is terminated or revoked sooner.  Performed at [facility], [address] [place], [place] [address]      Radiology Studies: DG Chest 2 View  Result Date: 10/10/2019 CLINICAL DATA:  Chest pain, shortness of breath, cough, nasal congestion, and fever for 7 days. EXAM: CHEST - 2 VIEW COMPARISON:  None. FINDINGS: Cardiac enlargement. No vascular congestion or edema. No focal consolidation.  No pleural effusions. No pneumothorax. Mediastinal contours appear intact. Degenerative changes in the spine. IMPRESSION: Cardiac enlargement. No evidence of active pulmonary disease. Electronically Signed   By: Lucienne Capers M.D.   On: 10/10/2019 00:22   CT Angio Chest PE W and/or Wo Contrast  Result Date: 10/10/2019 CLINICAL DATA:  Shortness of breath for several days. COVID positive. High suspicion of pulmonary embolus. EXAM: CT ANGIOGRAPHY CHEST WITH CONTRAST TECHNIQUE: Multidetector CT imaging of the chest was performed using the standard protocol during bolus administration of intravenous contrast. Multiplanar CT image reconstructions and MIPs were obtained to evaluate the vascular anatomy. CONTRAST:  49mL OMNIPAQUE  IOHEXOL 350 MG/ML SOLN COMPARISON:  None. FINDINGS: Cardiovascular: Examination is technically limited due to motion artifact. There is good opacification of the central and segmental pulmonary arteries. Multiple filling defects in the distal main pulmonary arteries, extending into bilateral upper and lower lobe lobar and segmental branches consistent with multiple pulmonary emboli. RV to LV ratio is 1.5 suggesting evidence of right heart strain. No pericardial effusions. Normal caliber thoracic aorta. No aortic dissection. Mediastinum/Nodes: Mediastinal lymph nodes are not pathologically enlarged, likely reactive. Esophagus is decompressed. Lungs/Pleura: Motion artifact limits examination. Nodular airspace infiltrates in the lung bases likely indicate COVID pneumonia. Upper Abdomen: No acute process demonstrated in the visualized upper abdomen. Musculoskeletal: Degenerative changes in the spine. No destructive bone lesions. Review of the MIP images confirms the above findings. IMPRESSION: 1. Positive examination for multiple bilateral pulmonary emboli with CT evidence of right heart strain (RV/LV Ratio = 1.5) consistent with at least submassive (intermediate risk) PE. The presence of right  heart strain has been associated with an increased risk of morbidity and mortality. 2. Nodular airspace infiltrates in the lung bases likely indicate COVID pneumonia. Critical Value/emergent results were called by telephone at the time of interpretation on 10/10/2019 at 4:36 am to provider [facility] , who verbally acknowledged these results. Electronically Signed   By: Lucienne Capers M.D.   On: 10/10/2019 04:40   PERIPHERAL VASCULAR CATHETERIZATION  Result Date: 10/10/2019 See op note  US Venous Img Lower Bilateral (DVT)  Result Date: 10/10/2019 CLINICAL DATA:  Known pulmonary emboli EXAM: BILATERAL LOWER EXTREMITY VENOUS DOPPLER ULTRASOUND TECHNIQUE: Gray-scale sonography with graded compression, as well as color Doppler and duplex ultrasound were performed to evaluate the lower extremity deep venous systems from the level of the common femoral vein and including the common femoral, femoral, profunda femoral, popliteal and calf veins including the posterior tibial, peroneal and gastrocnemius veins when visible. The superficial great saphenous vein was also interrogated. Spectral Doppler was utilized to evaluate flow at rest and with distal augmentation maneuvers in the common femoral, femoral and popliteal veins. COMPARISON:  None. FINDINGS: RIGHT LOWER EXTREMITY Common Femoral Vein: No evidence of thrombus. Normal compressibility, respiratory phasicity and response to augmentation. Saphenofemoral Junction: No evidence of thrombus. Normal compressibility and flow on color Doppler imaging. Profunda Femoral Vein: No evidence of thrombus. Normal compressibility and flow on color Doppler imaging. Femoral Vein: No evidence of thrombus. Normal compressibility, respiratory phasicity and response to augmentation. Popliteal Vein: No evidence of thrombus. Normal compressibility, respiratory phasicity and response to augmentation. Calf Veins: Posterior tibial veins are within normal limits. Peroneal veins are  not visualized. Superficial Great Saphenous Vein: No evidence of thrombus. Normal compressibility. Venous Reflux:  None. Other Findings:  None. LEFT LOWER EXTREMITY Common Femoral Vein: No evidence of thrombus. Normal compressibility, respiratory phasicity and response to augmentation. Saphenofemoral Junction: No evidence of thrombus. Normal compressibility and flow on color Doppler imaging. Profunda Femoral Vein: No evidence of thrombus. Normal compressibility and flow on color Doppler imaging. Femoral Vein: No evidence of thrombus. Normal compressibility, respiratory phasicity and response to augmentation. Popliteal Vein: No evidence of thrombus. Normal compressibility, respiratory phasicity and response to augmentation. Calf Veins: Posterior tibial veins are within normal limits. Peroneal veins are not visualized. Superficial Great Saphenous Vein: No evidence of thrombus. Normal compressibility. Venous Reflux:  None. Other Findings:  None. IMPRESSION: No evidence of deep venous thrombosis in either lower extremity. Electronically Signed   By: Inez Catalina M.D.   On: 10/10/2019 16:16   ECHOCARDIOGRAM  COMPLETE  Result Date: 10/10/2019    ECHOCARDIOGRAM REPORT   Patient Name:   MADAI NUCCIO Date of Exam: 10/10/2019 Medical Rec #:  [id number]   Height:       64.0 in Accession #:    [id number]  Weight:       297.6 lb Date of Birth:  1955/06/13  BSA:          2.316 m Patient Age:    71 years    BP:           155/86 mmHg Patient Gender: F           HR:           80 bpm. Exam Location:  [facility] Procedure: 2D Echo, Cardiac Doppler and Color Doppler Indications:     Pulmonary embolus 415.19  History:         Patient has no prior history of Echocardiogram examinations. No                  cardiac history listed in chart.  Sonographer:     Sherrie Sport RDCS (AE) Referring Phys:  [id number] Arvella Merles MANSY Diagnosing Phys: Yolonda Kida MD  Sonographer Comments: Technically difficult study due to poor echo windows. IMPRESSIONS  1.  Left ventricular ejection fraction, by estimation, is 55 to 60%. The left ventricle has normal function. The left ventricle has no regional wall motion abnormalities. Left ventricular diastolic parameters are consistent with Grade II diastolic dysfunction (pseudonormalization).  2. Right ventricular systolic function is normal. The right ventricular size is normal.  3. The mitral valve is grossly normal. Trivial mitral valve regurgitation. Moderate mitral annular calcification.  4. The aortic valve is grossly normal. Aortic valve regurgitation is not visualized. FINDINGS  Left Ventricle: Left ventricular ejection fraction, by estimation, is 55 to 60%. The left ventricle has normal function. The left ventricle has no regional wall motion abnormalities. The left ventricular internal cavity size was normal in size. There is  no left ventricular hypertrophy. Left ventricular diastolic parameters are consistent with Grade II diastolic dysfunction (pseudonormalization). Right Ventricle: The right ventricular size is normal. No increase in right ventricular wall thickness. Right ventricular systolic function is normal. Left Atrium: Left atrial size was normal in size. Right Atrium: Right atrial size was normal in size. Pericardium: There is no evidence of pericardial effusion. Mitral Valve: The mitral valve is grossly normal. Moderate mitral annular calcification. Trivial mitral valve regurgitation. Tricuspid Valve: The tricuspid valve is normal in structure. Tricuspid valve regurgitation is not demonstrated. Aortic Valve: The aortic valve is grossly normal. Aortic valve regurgitation is not visualized. Aortic valve mean gradient measures 7.3 mmHg. Aortic valve peak gradient measures 11.6 mmHg. Aortic valve area, by VTI measures 3.35 cm. Pulmonic Valve: The pulmonic valve was normal in structure. Pulmonic valve regurgitation is not visualized. Aorta: The aortic arch was not well visualized. IAS/Shunts: No atrial level  shunt detected by color flow Doppler.  LEFT VENTRICLE PLAX 2D LVIDd:         3.93 cm  Diastology LVIDs:         2.69 cm  LV e' medial:    4.68 cm/s LV PW:         1.20 cm  LV E/e' medial:  14.1 LV IVS:        1.87 cm  LV e' lateral:   5.00 cm/s LVOT diam:     2.00 cm  LV E/e' lateral: 13.2 LV SV:  112 LV SV Index:   48 LVOT Area:     3.14 cm  RIGHT VENTRICLE RV Basal diam:  3.41 cm RV S prime:     17.30 cm/s TAPSE (M-mode): 3.6 cm LEFT ATRIUM             Index       RIGHT ATRIUM           Index LA diam:        3.70 cm 1.60 cm/m  RA Area:     25.40 cm LA Vol (A2C):   73.1 ml 31.56 ml/m RA Volume:   85.40 ml  36.87 ml/m LA Vol (A4C):   76.5 ml 33.02 ml/m LA Biplane Vol: 77.6 ml 33.50 ml/m  AORTIC VALVE                    PULMONIC VALVE AV Area (Vmax):    2.48 cm     PV Vmax:        0.62 m/s AV Area (Vmean):   2.52 cm     PV Peak grad:   1.5 mmHg AV Area (VTI):     3.35 cm     RVOT Peak grad: 3 mmHg AV Vmax:           170.00 cm/s AV Vmean:          119.000 cm/s AV VTI:            0.334 m AV Peak Grad:      11.6 mmHg AV Mean Grad:      7.3 mmHg LVOT Vmax:         134.00 cm/s LVOT Vmean:        95.600 cm/s LVOT VTI:          0.357 m LVOT/AV VTI ratio: 1.07  AORTA Ao Root diam: 3.30 cm MITRAL VALVE                TRICUSPID VALVE MV Area (PHT): 3.77 cm     TR Peak grad:   11.7 mmHg MV Decel Time: 201 msec     TR Vmax:        171.00 cm/s MV E velocity: 66.00 cm/s MV A velocity: 112.00 cm/s  SHUNTS MV E/A ratio:  0.59         Systemic VTI:  0.36 m                             Systemic Diam: 2.00 cm Dwayne Prince Rome MD Electronically signed by Yolonda Kida MD Signature Date/Time: 10/10/2019/5:11:00 PM    Final    Scheduled Meds: . apixaban  10 mg Oral BID   Followed by  . [START ON 10/18/2019] apixaban  5 mg Oral BID  . vitamin C  500 mg Oral Daily  . aspirin EC  81 mg Oral Daily  . atorvastatin  40 mg Oral Daily  . baricitinib  4 mg Oral Daily  . cholecalciferol  1,000 Units Oral Daily  .  famotidine  20 mg Oral BID  . fluticasone  2 spray Each Nare Daily  . guaiFENesin  600 mg Oral BID  . losartan  100 mg Oral Daily  . methylPREDNISolone (SOLU-MEDROL) injection  125 mg Intravenous Q12H   Followed by  . [START ON 10/13/2019] predniSONE  50 mg Oral Daily  . metoprolol tartrate  12.5 mg Oral BID  . zinc sulfate  220 mg Oral  Daily   Continuous Infusions: .  ceFAZolin (ANCEF) IV    . remdesivir 100 mg in NS 100 mL 100 mg (10/11/19 1106)     LOS: 1 day   Para Skeans, MD [facility] Pager [phone] If 7PM-7AM, please contact night-coverage [email or web address] Password [facility] 10/11/2019, 2:43 PM

## 2019-10-12 LAB — CBC WITH DIFFERENTIAL/PLATELET
Abs Immature Granulocytes: 0.06 10*3/uL (ref 0.00–0.07)
Basophils Absolute: 0 10*3/uL (ref 0.0–0.1)
Basophils Relative: 0 %
Eosinophils Absolute: 0 10*3/uL (ref 0.0–0.5)
Eosinophils Relative: 0 %
HCT: 35.4 % — ABNORMAL LOW (ref 36.0–46.0)
Hemoglobin: 11.5 g/dL — ABNORMAL LOW (ref 12.0–15.0)
Immature Granulocytes: 1 %
Lymphocytes Relative: 11 %
Lymphs Abs: 0.8 10*3/uL (ref 0.7–4.0)
MCH: 28 pg (ref 26.0–34.0)
MCHC: 32.5 g/dL (ref 30.0–36.0)
MCV: 86.1 fL (ref 80.0–100.0)
Monocytes Absolute: 0.3 10*3/uL (ref 0.1–1.0)
Monocytes Relative: 4 %
Neutro Abs: 5.9 10*3/uL (ref 1.7–7.7)
Neutrophils Relative %: 84 %
Platelets: 153 10*3/uL (ref 150–400)
RBC: 4.11 MIL/uL (ref 3.87–5.11)
RDW: 13 % (ref 11.5–15.5)
WBC: 7.1 10*3/uL (ref 4.0–10.5)
nRBC: 0 % (ref 0.0–0.2)

## 2019-10-12 LAB — COMPREHENSIVE METABOLIC PANEL
ALT: 26 U/L (ref 0–44)
AST: 26 U/L (ref 15–41)
Albumin: 3.3 g/dL — ABNORMAL LOW (ref 3.5–5.0)
Alkaline Phosphatase: 55 U/L (ref 38–126)
Anion gap: 8 (ref 5–15)
BUN: 20 mg/dL (ref 8–23)
CO2: 27 mmol/L (ref 22–32)
Calcium: 8.4 mg/dL — ABNORMAL LOW (ref 8.9–10.3)
Chloride: 105 mmol/L (ref 98–111)
Creatinine, Ser: 0.53 mg/dL (ref 0.44–1.00)
GFR calc Af Amer: >60 mL/min (ref >60)
GFR calc non Af Amer: >60 mL/min (ref >60)
Glucose, Bld: 156 mg/dL — ABNORMAL HIGH (ref 70–99)
Potassium: 3.5 mmol/L (ref 3.5–5.1)
Sodium: 140 mmol/L (ref 135–145)
Total Bilirubin: 0.6 mg/dL (ref 0.3–1.2)
Total Protein: 6.1 g/dL — ABNORMAL LOW (ref 6.5–8.1)

## 2019-10-12 LAB — C-REACTIVE PROTEIN: CRP: 0.8 mg/dL (ref <1.0)

## 2019-10-12 LAB — FERRITIN: Ferritin: 204 ng/mL (ref 11–307)

## 2019-10-12 LAB — LACTATE DEHYDROGENASE: LDH: 188 U/L (ref 98–192)

## 2019-10-12 LAB — TROPONIN I (HIGH SENSITIVITY): Troponin I (High Sensitivity): 81 ng/L — ABNORMAL HIGH (ref <18)

## 2019-10-12 LAB — FIBRIN DERIVATIVES D-DIMER (ARMC ONLY): Fibrin derivatives D-dimer (ARMC): 5729.56 ng/mL (FEU) — ABNORMAL HIGH (ref 0.00–499.00)

## 2019-10-12 LAB — MAGNESIUM: Magnesium: 1.6 mg/dL — ABNORMAL LOW (ref 1.7–2.4)

## 2019-10-12 MED ORDER — MAGNESIUM SULFATE 2 GM/50ML IV SOLN
2.0000 g | Freq: Once | INTRAVENOUS | Status: AC
  Administered 2019-10-12: 2 g via INTRAVENOUS
  Filled 2019-10-12: qty 50

## 2019-10-12 NOTE — Progress Notes (Signed)
PROGRESS NOTE    Jasmine Webb  CHE:[id number] DOB: [birth year] DOA: 10/10/2019 PCP: Venita Lick, NP   Brief Narrative:  Jasmine Webb  is a 64 y.o. Caucasian female with a known history of hypertension and osteoarthritis, who presented to the emergency room with acute onset of worsening dyspnea with associated cough with inability to expectorate without wheezing.  She has been having fever and chills and admitted to nausea as well as diarrhea for couple of days.  She admitted to chest pain with cough.  Her sense of taste has been different.  She stated that it has been better with slightly diminished smell sensation.  She has been having diminished appetite and has been taking Pedialyte.  She denies any body aches.  No dysuria, oliguria or hematuria or flank pain.  She has not been vaccinated against COVID-19.  She stated that she has been busy taking care of her 2 grandchildren.  Upon presentation  to the emergency room, blood pressure was 142/95 with a heart rate of 108 respiratory rate of 22.  Labs revealed a BNP of 66.1 and high-sensitivity troponin I was 657 and later 557.  PT was 35 with INR 1 PT 13.1 COVID-19 PCR came back positive.  Two-view chest x-ray showed cardiomegaly with no acute cardiopulmonary disease.Chest CT angiogram revealed bilateral multiple PE with evidence of right ventricular strain consistent with at least submassive PE.  It also showed nodular airspace infiltrates in the lung bases likely indicating COVID-19 pneumonia.  The patient was given 1 L bolus of IV lactated Ringer followed by 125 mL/h, and 25 mg IV Solu-Medrol, IV remdesivir and IV heparin bolus followed by drip.  Contact was made with Dr. Lucky Cowboy with vascular surgery and he will attempt intra-arterial thrombolysis later today.  She will be admitted to a progressive unit bed for further evaluation and management.   Assessment & Plan:   Principal Problem:   Acute hypoxemic respiratory failure due to COVID-19  [facility]) Active Problems:   Bilateral pulmonary embolism (HCC)   Elevated troponin level   Essential hypertension 1. Acute hypoxemic respiratory failure secondary to COVID-19. -The patient will be admitted to a medically monitored isolation bed. -O2 protocol will be followed to keep O2 saturation above 93.  2.   Multifocal pneumonia secondary to COVID-19. -The patient will be admitted to an isolation monitored bed with droplet and contact precautions. -Given multifocal pneumonia we will empirically place the patient on IV Rocephin and Zithromax for possible bacterial superinfection only with elevated Procalcitonin. -The patient will be placed on scheduled Mucinex and as needed Tussionex. -We will avoid nebulization as much as we can, give bronchodilator MDI if needed, and with deterioration of oxygenation try to avoid BiPAP/CPAP if possible.  -Will obtain sputum Gram stain culture and sensitivity and follow blood cultures. -O2 protocol will be followed. -We will follow CRP, ferritin, LDH and D-dimer. -Will follow manual differential for ANC/ALC ratio as well as follow troponin I and daily CBC with manual differential and CMP. - Will place the patient on IV Remdesivir and IV steroid therapy with Decadron with elevated inflammatory markers. -The patient will be placed on vitamin D3, vitamin C, zinc sulfate, p.o. Pepcid and aspirin. -I discussed Baricitinib with the patient and she agreed to proceed with it.  3.  Acute bilateral submassive pulmonary embolism with right ventricular strain with elevated troponin I and chest pain. -The patient will be placed on IV heparin. -We will obtain a 2D echo for assessment in a.m. -Bilateral  lower extremity venous Doppler to rule out DVT as a source for her PE. -Vascular surgery consult was ordered. -Dr. Lucky Cowboy is aware about the patient and will be planning an intra-arterial thrombosis later today. -We will follow serial troponin I's and obtain a 2D  echo as mentioned above. -Given associated chest pain will place on as needed sublingual nitroglycerin and morphine sulfate. -She will also be placed on beta-blocker therapy and statin therapy.  Aspirin was held off given history of NSAIDs allergy. Pt chagnged to eliquis.   4.  Essential hypertension. -We will continue metoprolol and  Cozaar.  5.  DVT prophylaxis. -The patient will be on Eliquis.   Code Status: Full Code Family Communication: None at bedside pt stays with grandchildren.  Disposition Plan: STR.  Status is: Inpatient Remains inpatient appropriate because:Inpatient level of care appropriate due to severity of illness   Dispo: The patient is from: STR              Anticipated d/c is to: Home              Anticipated d/c date is: > 3 days              Patient currently is not medically stable to d/c.   Consultants:  -Cardiology-Dr.Callwood.  -Vascular -Dr.Dew.  Procedures:  -Pulmonary Angiogram, catheter directed thrombolysis and thrombectomy.   Subjective: Pt admitted with chest pain and sob,found to have covid-19 pna and BL  extensive submassive PE with right heart strain. Vascular sx consulted and pt is s/p cath directed thrombolysis and thrombectomy. Cardiology consulted as well. We appreciate consultant management and care. 9/11 Pt seen today she is alert and awake and oriented and is very grateful to be feeling better.  She is on 2 L [place] and is doing well. Expect d/c tomorrow.    Objective: Vitals:   10/11/19 2035 10/12/19 0445 10/12/19 0934 10/12/19 1232  BP: (!) 153/80 (!) 147/82 (!) 163/76 136/75  Pulse: 63 (!) 54 62 (!) 58  Resp: (!) 21 20 19 19   Temp: 97.7 F (36.5 C) 97.8 F (36.6 C) 97.7 F (36.5 C)   TempSrc: Oral Oral Oral   SpO2: 98% 98% 100% 100%  Weight:  133 kg    Height:        Intake/Output Summary (Last 24 hours) at 10/12/2019 1439 Last data filed at 10/12/2019 1042 Gross per 24 hour  Intake 1280 ml  Output 950 ml  Net 330  ml   Filed Weights   10/10/19 2003 10/11/19 0528 10/12/19 0445  Weight: (!) 138.8 kg (!) 139.2 kg 133 kg   Examination: Blood pressure 136/75, pulse (!) 58, temperature 97.7 F (36.5 C), temperature source Oral, resp. rate 19, height 5\' 3"  (1.6 m), weight 133 kg, SpO2 100 %.  General exam: Appears calm and comfortable  Respiratory system: Clear to auscultation. Respiratory effort normal. Cardiovascular system: RRR. No JVD, murmurs, rubs, gallops or clicks. No pedal edema. Gastrointestinal system: Abdomen is nondistended, soft and nontender. No organomegaly or masses felt. Normal bowel sounds heard. Central nervous system: Alert and oriented. No focal neurological deficits. Extremities: Symmetric 5 x 5 power. Skin: No rashes, lesions or ulcers Psychiatry: Judgement and insight appear normal. Mood & affect appropriate.   Data Reviewed: I have personally reviewed following labs and imaging studies  I/O last 3 completed shifts: In: 48 [P.O.:960] Out: 950 [Urine:950] Total I/O In: 320 [P.O.:220; IV Piggyback:100] Out: -  Lab Results  Component Value Date  CREATININE 0.53 10/12/2019   CREATININE 0.59 10/11/2019   CREATININE 0.69 10/09/2019   CBC: Recent Labs  Lab 10/09/19 2324 10/11/19 0532 10/12/19 0408  WBC 5.1 6.2 7.1  NEUTROABS  --  3.8 5.9  HGB 13.1 11.8* 11.5*  HCT 39.5 34.3* 35.4*  MCV 86.4 84.1 86.1  PLT 132* 137* 102   Basic Metabolic Panel: Recent Labs  Lab 10/09/19 2324 10/11/19 0532 10/12/19 0408  NA 140 142 140  K 3.8 3.5 3.5  CL 104 107 105  CO2 25 27 27   GLUCOSE 125* 127* 156*  BUN 15 16 20   CREATININE 0.69 0.59 0.53  CALCIUM 8.3* 8.0* 8.4*  MG  --  1.7 1.6*   GFR: Estimated Creatinine Clearance: 93.6 mL/min (by C-G formula based on SCr of 0.53 mg/dL). Liver Function Tests: Recent Labs  Lab 10/11/19 0532 10/12/19 0408  AST 36 26  ALT 26 26  ALKPHOS 50 55  BILITOT 0.9 0.6  PROT 5.7* 6.1*  ALBUMIN 3.0* 3.3*   Coagulation  Profile: Recent Labs  Lab 10/10/19 0301  INR 1.0   Lipid Profile: Recent Labs    10/10/19 0851  CHOL 139  HDL 36*  LDLCALC 89  TRIG 71  CHOLHDL 3.9   Anemia Panel: Recent Labs    10/11/19 0532 10/12/19 0408  FERRITIN 256 204   Sepsis Labs: Recent Labs  Lab 10/10/19 0535  PROCALCITON <0.10    Recent Results (from the past 240 hour(s))  SARS Coronavirus 2 by RT PCR (hospital order, performed in [place] hospital lab) Nasopharyngeal Nasopharyngeal Swab     Status: Abnormal   Collection Time: 10/09/19 11:24 PM   Specimen: Nasopharyngeal Swab  Result Value Ref Range Status   SARS Coronavirus 2 POSITIVE (A) NEGATIVE Final    Comment: RESULT CALLED TO, READ BACK BY AND VERIFIED WITH: Mosetta Pigeon RN [phone] 10/10/19 HNM (NOTE) SARS-CoV-2 target nucleic acids are DETECTED  SARS-CoV-2 RNA is generally detectable in upper respiratory specimens  during the acute phase of infection.  Positive results are indicative  of the presence of the identified virus, but do not rule out bacterial infection or co-infection with other pathogens not detected by the test.  Clinical correlation with patient history and  other diagnostic information is necessary to determine patient infection status.  The expected result is negative.  Fact Sheet for Patients:   StrictlyIdeas.no   Fact Sheet for Healthcare Providers:   BankingDealers.co.za    This test is not yet approved or cleared by the [place] FDA and  has been authorized for detection and/or diagnosis of SARS-CoV-2 by FDA under an Emergency Use Authorization (EUA).  This EUA will remain in effect (meaning this test  can be used) for the duration of  the COVID-19 declaration under Section 564(b)(1) of the Act, 21 U.S.C. section 360-bbb-3(b)(1), unless the authorization is terminated or revoked sooner.  Performed at [facility], [address] [place], [place]  [address]      Radiology Studies: PERIPHERAL VASCULAR CATHETERIZATION  Result Date: 10/10/2019 See op note  US Venous Img Lower Bilateral (DVT)  Result Date: 10/10/2019 CLINICAL DATA:  Known pulmonary emboli EXAM: BILATERAL LOWER EXTREMITY VENOUS DOPPLER ULTRASOUND TECHNIQUE: Gray-scale sonography with graded compression, as well as color Doppler and duplex ultrasound were performed to evaluate the lower extremity deep venous systems from the level of the common femoral vein and including the common femoral, femoral, profunda femoral, popliteal and calf veins including the posterior tibial, peroneal and gastrocnemius veins  when visible. The superficial great saphenous vein was also interrogated. Spectral Doppler was utilized to evaluate flow at rest and with distal augmentation maneuvers in the common femoral, femoral and popliteal veins. COMPARISON:  None. FINDINGS: RIGHT LOWER EXTREMITY Common Femoral Vein: No evidence of thrombus. Normal compressibility, respiratory phasicity and response to augmentation. Saphenofemoral Junction: No evidence of thrombus. Normal compressibility and flow on color Doppler imaging. Profunda Femoral Vein: No evidence of thrombus. Normal compressibility and flow on color Doppler imaging. Femoral Vein: No evidence of thrombus. Normal compressibility, respiratory phasicity and response to augmentation. Popliteal Vein: No evidence of thrombus. Normal compressibility, respiratory phasicity and response to augmentation. Calf Veins: Posterior tibial veins are within normal limits. Peroneal veins are not visualized. Superficial Great Saphenous Vein: No evidence of thrombus. Normal compressibility. Venous Reflux:  None. Other Findings:  None. LEFT LOWER EXTREMITY Common Femoral Vein: No evidence of thrombus. Normal compressibility, respiratory phasicity and response to augmentation. Saphenofemoral Junction: No evidence of thrombus. Normal compressibility and flow on color Doppler imaging.  Profunda Femoral Vein: No evidence of thrombus. Normal compressibility and flow on color Doppler imaging. Femoral Vein: No evidence of thrombus. Normal compressibility, respiratory phasicity and response to augmentation. Popliteal Vein: No evidence of thrombus. Normal compressibility, respiratory phasicity and response to augmentation. Calf Veins: Posterior tibial veins are within normal limits. Peroneal veins are not visualized. Superficial Great Saphenous Vein: No evidence of thrombus. Normal compressibility. Venous Reflux:  None. Other Findings:  None. IMPRESSION: No evidence of deep venous thrombosis in either lower extremity. Electronically Signed   By: Inez Catalina M.D.   On: 10/10/2019 16:16   Scheduled Meds: . apixaban  10 mg Oral BID   Followed by  . [START ON 10/18/2019] apixaban  5 mg Oral BID  . vitamin C  500 mg Oral Daily  . aspirin EC  81 mg Oral Daily  . atorvastatin  40 mg Oral Daily  . baricitinib  4 mg Oral Daily  . cholecalciferol  1,000 Units Oral Daily  . famotidine  20 mg Oral BID  . fluticasone  2 spray Each Nare Daily  . guaiFENesin  600 mg Oral BID  . losartan  100 mg Oral Daily  . methylPREDNISolone (SOLU-MEDROL) injection  125 mg Intravenous Q12H   Followed by  . [START ON 10/13/2019] predniSONE  50 mg Oral Daily  . metoprolol tartrate  12.5 mg Oral BID  . zinc sulfate  220 mg Oral Daily   Continuous Infusions: . magnesium sulfate bolus IVPB    . remdesivir 100 mg in NS 100 mL Stopped (10/12/19 1010)     LOS: 2 days   Para Skeans, MD [facility] Pager [phone] If 7PM-7AM, please contact night-coverage [email or web address] Password [facility] 10/12/2019, 2:39 PM

## 2019-10-12 NOTE — Progress Notes (Signed)
[facility] Cardiology    SUBJECTIVE: Feels much better denies any significant shortness of breath no palpitations or tachycardia resting comfortably   Vitals:   10/12/19 0445 10/12/19 0934 10/12/19 1232 10/12/19 1528  BP: (!) 147/82 (!) 163/76 136/75 128/73  Pulse: (!) 54 62 (!) 58 (!) 56  Resp: 20 19 19 19   Temp: 97.8 F (36.6 C) 97.7 F (36.5 C)  98.6 F (37 C)  TempSrc: Oral Oral  Oral  SpO2: 98% 100% 100% 99%  Weight: 133 kg     Height:         Intake/Output Summary (Last 24 hours) at 10/12/2019 1532 Last data filed at 10/12/2019 1042 Gross per 24 hour  Intake 1280 ml  Output 950 ml  Net 330 ml      PHYSICAL EXAM  General: Well developed, well nourished, in no acute distress HEENT:  Normocephalic and atramatic Neck:  No JVD.  Lungs: Clear bilaterally to auscultation and percussion. Heart: HRRR . Normal S1 and S2 without gallops or murmurs.  Abdomen: Bowel sounds are positive, abdomen soft and non-tender  Msk:  Back normal, normal gait. Normal strength and tone for age. Extremities: No clubbing, cyanosis or edema.   Neuro: Alert and oriented X 3. Psych:  Good affect, responds appropriately   LABS: Basic Metabolic Panel: Recent Labs    10/11/19 0532 10/12/19 0408  NA 142 140  K 3.5 3.5  CL 107 105  CO2 27 27  GLUCOSE 127* 156*  BUN 16 20  CREATININE 0.59 0.53  CALCIUM 8.0* 8.4*  MG 1.7 1.6*   Liver Function Tests: Recent Labs    10/11/19 0532 10/12/19 0408  AST 36 26  ALT 26 26  ALKPHOS 50 55  BILITOT 0.9 0.6  PROT 5.7* 6.1*  ALBUMIN 3.0* 3.3*   No results for input(s): LIPASE, AMYLASE in the last 72 hours. CBC: Recent Labs    10/11/19 0532 10/12/19 0408  WBC 6.2 7.1  NEUTROABS 3.8 5.9  HGB 11.8* 11.5*  HCT 34.3* 35.4*  MCV 84.1 86.1  PLT 137* 153   Cardiac Enzymes: No results for input(s): CKTOTAL, CKMB, CKMBINDEX, TROPONINI in the last 72 hours. BNP: Invalid input(s): POCBNP D-Dimer: No results for input(s): DDIMER in the last 72  hours. Hemoglobin A1C: No results for input(s): HGBA1C in the last 72 hours. Fasting Lipid Panel: Recent Labs    10/10/19 0851  CHOL 139  HDL 36*  LDLCALC 89  TRIG 71  CHOLHDL 3.9   Thyroid Function Tests: No results for input(s): TSH, T4TOTAL, T3FREE, THYROIDAB in the last 72 hours.  Invalid input(s): FREET3 Anemia Panel: Recent Labs    10/12/19 0408  FERRITIN 204    PERIPHERAL VASCULAR CATHETERIZATION  Result Date: 10/10/2019 See op note  US Venous Img Lower Bilateral (DVT)  Result Date: 10/10/2019 CLINICAL DATA:  Known pulmonary emboli EXAM: BILATERAL LOWER EXTREMITY VENOUS DOPPLER ULTRASOUND TECHNIQUE: Gray-scale sonography with graded compression, as well as color Doppler and duplex ultrasound were performed to evaluate the lower extremity deep venous systems from the level of the common femoral vein and including the common femoral, femoral, profunda femoral, popliteal and calf veins including the posterior tibial, peroneal and gastrocnemius veins when visible. The superficial great saphenous vein was also interrogated. Spectral Doppler was utilized to evaluate flow at rest and with distal augmentation maneuvers in the common femoral, femoral and popliteal veins. COMPARISON:  None. FINDINGS: RIGHT LOWER EXTREMITY Common Femoral Vein: No evidence of thrombus. Normal compressibility, respiratory phasicity and response  to augmentation. Saphenofemoral Junction: No evidence of thrombus. Normal compressibility and flow on color Doppler imaging. Profunda Femoral Vein: No evidence of thrombus. Normal compressibility and flow on color Doppler imaging. Femoral Vein: No evidence of thrombus. Normal compressibility, respiratory phasicity and response to augmentation. Popliteal Vein: No evidence of thrombus. Normal compressibility, respiratory phasicity and response to augmentation. Calf Veins: Posterior tibial veins are within normal limits. Peroneal veins are not visualized. Superficial Great  Saphenous Vein: No evidence of thrombus. Normal compressibility. Venous Reflux:  None. Other Findings:  None. LEFT LOWER EXTREMITY Common Femoral Vein: No evidence of thrombus. Normal compressibility, respiratory phasicity and response to augmentation. Saphenofemoral Junction: No evidence of thrombus. Normal compressibility and flow on color Doppler imaging. Profunda Femoral Vein: No evidence of thrombus. Normal compressibility and flow on color Doppler imaging. Femoral Vein: No evidence of thrombus. Normal compressibility, respiratory phasicity and response to augmentation. Popliteal Vein: No evidence of thrombus. Normal compressibility, respiratory phasicity and response to augmentation. Calf Veins: Posterior tibial veins are within normal limits. Peroneal veins are not visualized. Superficial Great Saphenous Vein: No evidence of thrombus. Normal compressibility. Venous Reflux:  None. Other Findings:  None. IMPRESSION: No evidence of deep venous thrombosis in either lower extremity. Electronically Signed   By: Inez Catalina M.D.   On: 10/10/2019 16:16     Echo preserved overall left ventricular function with RV strain  TELEMETRY: Normal sinus rhythm rate of 60:  ASSESSMENT AND PLAN:  Principal Problem:   Acute hypoxemic respiratory failure due to COVID-19 [facility]) Active Problems:   Essential hypertension   Bilateral pulmonary embolism (HCC)   Elevated troponin level    Plan Continue telemetry follow-up EKG troponin Agree with anticoagulation for pulmonary embolus Continue therapy for hypoxic restaurant failure secondary to Covid Elevated troponin probably demand ischemia strain Function management and control Conservative cardiac input Continue supplemental oxygen as necessary for hypoxemia At the patient follow-up with cardiology 3 to 4 weeks   Yolonda Kida, MD 10/12/2019 3:32 PM

## 2019-10-13 ENCOUNTER — Other Ambulatory Visit: Payer: Self-pay

## 2019-10-13 LAB — CBC WITH DIFFERENTIAL/PLATELET
Abs Immature Granulocytes: 0.28 10*3/uL — ABNORMAL HIGH (ref 0.00–0.07)
Basophils Absolute: 0 10*3/uL (ref 0.0–0.1)
Basophils Relative: 0 %
Eosinophils Absolute: 0 10*3/uL (ref 0.0–0.5)
Eosinophils Relative: 0 %
HCT: 38.7 % (ref 36.0–46.0)
Hemoglobin: 12.7 g/dL (ref 12.0–15.0)
Immature Granulocytes: 2 %
Lymphocytes Relative: 10 %
Lymphs Abs: 1.2 10*3/uL (ref 0.7–4.0)
MCH: 27.9 pg (ref 26.0–34.0)
MCHC: 32.8 g/dL (ref 30.0–36.0)
MCV: 84.9 fL (ref 80.0–100.0)
Monocytes Absolute: 0.4 10*3/uL (ref 0.1–1.0)
Monocytes Relative: 4 %
Neutro Abs: 10.2 10*3/uL — ABNORMAL HIGH (ref 1.7–7.7)
Neutrophils Relative %: 84 %
Platelets: 199 10*3/uL (ref 150–400)
RBC: 4.56 MIL/uL (ref 3.87–5.11)
RDW: 13 % (ref 11.5–15.5)
WBC: 12.1 10*3/uL — ABNORMAL HIGH (ref 4.0–10.5)
nRBC: 0 % (ref 0.0–0.2)

## 2019-10-13 LAB — COMPREHENSIVE METABOLIC PANEL
ALT: 23 U/L (ref 0–44)
AST: 21 U/L (ref 15–41)
Albumin: 3.5 g/dL (ref 3.5–5.0)
Alkaline Phosphatase: 57 U/L (ref 38–126)
Anion gap: 9 (ref 5–15)
BUN: 23 mg/dL (ref 8–23)
CO2: 27 mmol/L (ref 22–32)
Calcium: 8.4 mg/dL — ABNORMAL LOW (ref 8.9–10.3)
Chloride: 103 mmol/L (ref 98–111)
Creatinine, Ser: 0.66 mg/dL (ref 0.44–1.00)
GFR calc Af Amer: >60 mL/min (ref >60)
GFR calc non Af Amer: >60 mL/min (ref >60)
Glucose, Bld: 130 mg/dL — ABNORMAL HIGH (ref 70–99)
Potassium: 3.8 mmol/L (ref 3.5–5.1)
Sodium: 139 mmol/L (ref 135–145)
Total Bilirubin: 0.8 mg/dL (ref 0.3–1.2)
Total Protein: 6.5 g/dL (ref 6.5–8.1)

## 2019-10-13 LAB — C-REACTIVE PROTEIN: CRP: 0.7 mg/dL (ref <1.0)

## 2019-10-13 LAB — MAGNESIUM: Magnesium: 1.9 mg/dL (ref 1.7–2.4)

## 2019-10-13 LAB — FIBRIN DERIVATIVES D-DIMER (ARMC ONLY): Fibrin derivatives D-dimer (ARMC): 4971.08 ng/mL (FEU) — ABNORMAL HIGH (ref 0.00–499.00)

## 2019-10-13 LAB — LACTATE DEHYDROGENASE: LDH: 229 U/L — ABNORMAL HIGH (ref 98–192)

## 2019-10-13 LAB — TROPONIN I (HIGH SENSITIVITY): Troponin I (High Sensitivity): 37 ng/L — ABNORMAL HIGH (ref <18)

## 2019-10-13 LAB — FERRITIN: Ferritin: 220 ng/mL (ref 11–307)

## 2019-10-13 MED ORDER — METOPROLOL TARTRATE 25 MG PO TABS: 12.5000 mg | ORAL_TABLET | Freq: Two times a day (BID) | ORAL | Status: DC

## 2019-10-13 MED ORDER — AMLODIPINE BESYLATE 5 MG PO TABS
5.0000 mg | ORAL_TABLET | Freq: Two times a day (BID) | ORAL | 0 refills | Status: DC | PRN
Start: 2019-10-13 — End: 2019-11-19

## 2019-10-13 MED ORDER — METOPROLOL TARTRATE 25 MG PO TABS: 25.0000 mg | ORAL_TABLET | Freq: Two times a day (BID) | ORAL | Status: DC

## 2019-10-13 MED ORDER — AMLODIPINE BESYLATE 5 MG PO TABS: 5.0000 mg | ORAL_TABLET | Freq: Two times a day (BID) | ORAL | Status: DC | PRN

## 2019-10-13 MED ORDER — ASCORBIC ACID 500 MG PO TABS: 500.0000 mg | ORAL_TABLET | Freq: Every day | ORAL | 0 refills | Status: AC

## 2019-10-13 MED ORDER — ZINC SULFATE 220 (50 ZN) MG PO CAPS: 220.0000 mg | ORAL_CAPSULE | Freq: Every day | ORAL | 0 refills | Status: AC

## 2019-10-13 MED ORDER — ATORVASTATIN CALCIUM 40 MG PO TABS: 40.0000 mg | ORAL_TABLET | Freq: Every day | ORAL | 0 refills | Status: DC

## 2019-10-13 MED ORDER — APIXABAN 5 MG PO TABS
10.0000 mg | ORAL_TABLET | Freq: Two times a day (BID) | ORAL | 0 refills | Status: DC
Start: 2019-10-11 — End: 2019-11-19

## 2019-10-13 MED ORDER — LOSARTAN POTASSIUM 50 MG PO TABS: 50.0000 mg | ORAL_TABLET | Freq: Every day | ORAL | 0 refills | Status: DC

## 2019-10-13 MED ORDER — DEXAMETHASONE 0.5 MG PO TABS: 0.5000 mg | ORAL_TABLET | Freq: Two times a day (BID) | ORAL | 0 refills | Status: AC

## 2019-10-13 MED ORDER — APIXABAN 5 MG PO TABS: 5.0000 mg | ORAL_TABLET | Freq: Two times a day (BID) | ORAL | 0 refills | Status: DC

## 2019-10-13 NOTE — Progress Notes (Signed)
[facility] Cardiology    SUBJECTIVE: Patient states to be doing reasonably well has had improved shortness of breath blood pressure is slightly creeping up now no fever chills or sweats no nausea vomiting or diarrhea responding nicely to therapy   Vitals:   10/13/19 0535 10/13/19 0538 10/13/19 0814 10/13/19 0820  BP:   (!) 162/91   Pulse:   (!) 55 (!) 56  Resp:   18   Temp:   98.1 F (36.7 C)   TempSrc:   Oral   SpO2:  90% 97%   Weight: (!) 138.3 kg     Height:         Intake/Output Summary (Last 24 hours) at 10/13/2019 1031 Last data filed at 10/13/2019 0815 Gross per 24 hour  Intake 1082.93 ml  Output 450 ml  Net 632.93 ml      PHYSICAL EXAM  General: Well developed, well nourished, in no acute distress HEENT:  Normocephalic and atramatic Neck:  No JVD.  Lungs: Diminished breath sounds bilaterally to auscultation and percussion. Heart: HRRR . Normal S1 and S2 without gallops or murmurs.  Abdomen: Bowel sounds are positive, abdomen soft and non-tender  Msk:  Back normal, normal gait. Normal strength and tone for age. Extremities: No clubbing, cyanosis or edema.   Neuro: Alert and oriented X 3. Psych:  Good affect, responds appropriately   LABS: Basic Metabolic Panel: Recent Labs    10/12/19 0408 10/13/19 0454  NA 140 139  K 3.5 3.8  CL 105 103  CO2 27 27  GLUCOSE 156* 130*  BUN 20 23  CREATININE 0.53 0.66  CALCIUM 8.4* 8.4*  MG 1.6* 1.9   Liver Function Tests: Recent Labs    10/12/19 0408 10/13/19 0454  AST 26 21  ALT 26 23  ALKPHOS 55 57  BILITOT 0.6 0.8  PROT 6.1* 6.5  ALBUMIN 3.3* 3.5   No results for input(s): LIPASE, AMYLASE in the last 72 hours. CBC: Recent Labs    10/12/19 0408 10/13/19 0454  WBC 7.1 12.1*  NEUTROABS 5.9 10.2*  HGB 11.5* 12.7  HCT 35.4* 38.7  MCV 86.1 84.9  PLT 153 199   Cardiac Enzymes: No results for input(s): CKTOTAL, CKMB, CKMBINDEX, TROPONINI in the last 72 hours. BNP: Invalid input(s): POCBNP D-Dimer: No  results for input(s): DDIMER in the last 72 hours. Hemoglobin A1C: No results for input(s): HGBA1C in the last 72 hours. Fasting Lipid Panel: No results for input(s): CHOL, HDL, LDLCALC, TRIG, CHOLHDL, LDLDIRECT in the last 72 hours. Thyroid Function Tests: No results for input(s): TSH, T4TOTAL, T3FREE, THYROIDAB in the last 72 hours.  Invalid input(s): FREET3 Anemia Panel: Recent Labs    10/13/19 0454  FERRITIN 220    No results found.   Echo preserved overall left ventricular function  TELEMETRY: Normal sinus rhythm  heart rate 60-70:  ASSESSMENT AND PLAN:  Principal Problem:   Acute hypoxemic respiratory failure due to COVID-19 [facility]) Active Problems:   Essential hypertension   Bilateral pulmonary embolism (HCC)   Elevated troponin level    Plan Continue supplemental therapy for acute hypoxemic respiratory failure secondary to Covid Recommend hypertension management and control Continue anticoagulation for pulmonary embolus Troponin related to demand ischemia Increase metoprolol to 25 mg twice a day to help with blood pressure control Recommend supportive care   Yolonda Kida, MD 10/13/2019 10:31 AM

## 2019-10-13 NOTE — Discharge Summary (Signed)
Physician Discharge Summary  Jasmine Webb IEP:[id number] DOB: Jun 21, 1955 DOA: 10/10/2019  PCP: Venita Lick, NP  Admit date: 10/10/2019 Discharge date: 10/13/2019   Discharge Diagnoses:  Principal Problem:   Acute hypoxemic respiratory failure due to COVID-19 [facility]) Active Problems:   Bilateral pulmonary embolism (HCC)   Elevated troponin level   Essential hypertension   Discharge Condition:  Good.   Diet recommendation:  Heart healthy.  Filed Weights   10/11/19 0528 10/12/19 0445 10/13/19 0535  Weight: (!) 139.2 kg 133 kg (!) 138.3 kg    Discharge activity: Advance as tolerated.  History of present illness:  [name] a2 y.o.Caucasian femalewith a known history ofhypertension and osteoarthritis, who presented to the emergency room with acute onset ofworsening dyspnea with associated cough with inability to expectorate without wheezing. She has been having fever and chills and admitted to nausea aswell as diarrhea for couple of days. She admitted to chest pain with cough. Her sense of taste has been different. She stated that it has been better with slightly diminished smell sensation. She has been having diminished appetite and has been taking Pedialyte. She denies any body aches.No dysuria, oliguria or hematuria or flank pain. She has not been vaccinated against COVID-19. She stated that she has been busy taking care of her 2 grandchildren.  Upon presentation to the emergency room, blood pressure was 142/95with a heart rate of 108 respiratory rate of 22. Labs revealed a BNP of 66.1 and high-sensitivity troponin I was 657 and later 557. PT was 35 with INR 1 PT 13.1 COVID-19 PCR came back positive. Two-view chest x-ray showed cardiomegaly with no acute cardiopulmonary disease.Chest CT angiogram revealed bilateral multiple PE with evidence of right ventricular strain consistent with at least submassive PE. It also showed nodular airspace infiltrates in the  lung bases likely indicating COVID-19 pneumonia.  The patient was given 1 L bolus of IV lactated Ringer followed by 125 mL/h, and 25 mg IV Solu-Medrol, IV remdesivir and IV heparin bolus followed by drip. Contact was made with Dr. Lucky Cowboy with vascular surgery and he will attempt intra-arterial thrombolysis later today. She will be admitted to a progressive unit bed for further evaluation and management.  Hospital Course:  Pt admitted with chest pain and sob,found to have covid-19 pna and BL  extensive submassive PE with right heart strain. Vascular sx consulted and pt is s/p cath directed thrombolysis and thrombectomy. Cardiology consulted as well. We appreciate consultant management and care. 9/11 Pt seen today she is alert and awake and oriented and is very grateful to be feeling better.  She is on 2 L [place] and is doing well. Expect d/c tomorrow.  9/12 Pt seen today and she has recovered well and is stable, is crying when d/w her about being discharged home today and no need for home oxygen. She was so very hapyp that she is doing well and has reconvered well. She understand about anticoagulation plan and f/uw with pcp, vascular and cardiology . Pt's ambulating pulse oximetry testing was good pt can be d/c home today with home o2.  Consultants:  -Cardiology-Dr.Callwood.  -Vascular -Dr.Dew.  Procedures:  -Pulmonary Angiogram, catheter directed thrombolysis and thrombectomy.    Discharge Exam: Vitals:   10/13/19 0820 10/13/19 1230  BP:  (!) 154/72  Pulse: (!) 56 60  Resp:  19  Temp:  97.7 F (36.5 C)  SpO2:  98%    Physical Exam Vitals and nursing note reviewed.  Constitutional:      Appearance: She  is normal weight.  HENT:     Head: Normocephalic and atraumatic.     Mouth/Throat:     Pharynx: Oropharynx is clear.  Eyes:     Extraocular Movements: Extraocular movements intact.  Cardiovascular:     Rate and Rhythm: Normal rate and regular rhythm.     Pulses: Normal pulses.      Heart sounds: Normal heart sounds.  Pulmonary:     Effort: Pulmonary effort is normal.     Breath sounds: Normal breath sounds. No decreased breath sounds, wheezing, rhonchi or rales.  Abdominal:     General: Bowel sounds are normal.     Palpations: Abdomen is soft.  Musculoskeletal:        General: Normal range of motion.  Skin:    General: Skin is warm.  Neurological:     General: No focal deficit present.     Mental Status: She is alert and oriented to person, place, and time.  Psychiatric:        Mood and Affect: Mood normal.        Behavior: Behavior normal.    Discharge Instructions   Discharge Instructions    Call MD for:  difficulty breathing, headache or visual disturbances   Complete by: As directed    Call MD for:  extreme fatigue   Complete by: As directed    Call MD for:  hives   Complete by: As directed    Call MD for:  persistant dizziness or light-headedness   Complete by: As directed    Call MD for:  persistant nausea and vomiting   Complete by: As directed    Call MD for:  severe uncontrolled pain   Complete by: As directed    Call MD for:  temperature >100.4   Complete by: As directed    Diet - low sodium heart healthy   Complete by: As directed    Discharge instructions   Complete by: As directed    Please follow all outpatient appt. Please follow your remdesivir outpatient infusion.   Increase activity slowly   Complete by: As directed      Allergies as of 10/13/2019      Reactions   Ibuprofen    Told to avoid ibuprofen related products by doctor.      Medication List    TAKE these medications   acetaminophen 500 MG tablet Commonly known as: TYLENOL Take 500 mg by mouth every 6 (six) hours as needed.   amLODipine 5 MG tablet Commonly known as: NORVASC Take 1 tablet (5 mg total) by mouth 2 (two) times daily as needed (sbp over 160).   apixaban 5 MG Tabs tablet Commonly known as: ELIQUIS Take 2 tablets (10 mg total) by mouth 2  (two) times daily for 7 days.   apixaban 5 MG Tabs tablet Commonly known as: ELIQUIS Take 1 tablet (5 mg total) by mouth 2 (two) times daily. Start taking on: October 18, 2019   ascorbic acid 500 MG tablet Commonly known as: VITAMIN C Take 1 tablet (500 mg total) by mouth daily for 10 days. Start taking on: October 14, 2019   atorvastatin 40 MG tablet Commonly known as: LIPITOR Take 1 tablet (40 mg total) by mouth daily.   dexamethasone 0.5 MG tablet Commonly known as: Decadron Take 1 tablet (0.5 mg total) by mouth 2 (two) times daily for 7 days.   fluticasone 50 MCG/ACT nasal spray Commonly known as: FLONASE Place 2 sprays into both nostrils daily.  losartan 50 MG tablet Commonly known as: COZAAR Take 1 tablet (50 mg total) by mouth daily. What changed:   medication strength  how much to take   VITAMIN D PO Take 1,000 Units by mouth daily.   zinc sulfate 220 (50 Zn) MG capsule Take 1 capsule (220 mg total) by mouth daily for 10 days. Start taking on: October 14, 2019      Allergies  Allergen Reactions  . Ibuprofen     Told to avoid ibuprofen related products by doctor.    Follow-up Information    Dew, Erskine Squibb, MD Follow up in 1 month(s).   Specialties: Vascular Surgery, Radiology, Interventional Cardiology Why: One month follow up. No studies needed.  Contact information: [place] [address] [phone]        Marnee Guarneri T, NP Follow up in 1 week(s).   Specialty: Nurse Practitioner Contact information: [place] [address] [phone]        Yolonda Kida, MD Follow up in 1 month(s).   Specialties: Cardiology, Internal Medicine Contact information: [place] [address] [phone]                The results of significant diagnostics from this hospitalization (including imaging, microbiology, ancillary and laboratory) are listed below for reference.     Significant Diagnostic Studies: DG Chest 2 View  Result Date: 10/10/2019 CLINICAL DATA:  Chest pain, shortness of breath, cough, nasal congestion, and fever for 7 days. EXAM: CHEST - 2 VIEW COMPARISON:  None. FINDINGS: Cardiac enlargement. No vascular congestion or edema. No focal consolidation. No pleural effusions. No pneumothorax. Mediastinal contours appear intact. Degenerative changes in the spine. IMPRESSION: Cardiac enlargement. No evidence of active pulmonary disease. Electronically Signed   By: Lucienne Capers M.D.   On: 10/10/2019 00:22   CT Angio Chest PE W and/or Wo Contrast  Result Date: 10/10/2019 CLINICAL DATA:  Shortness of breath for several days. COVID positive. High suspicion of pulmonary embolus. EXAM: CT ANGIOGRAPHY CHEST WITH CONTRAST TECHNIQUE: Multidetector CT imaging of the chest was performed using the standard protocol during bolus administration of intravenous contrast. Multiplanar CT image reconstructions and MIPs were obtained to evaluate the vascular anatomy. CONTRAST:  40mL OMNIPAQUE IOHEXOL 350 MG/ML SOLN COMPARISON:  None. FINDINGS: Cardiovascular: Examination is technically limited due to motion artifact. There is good opacification of the central and segmental pulmonary arteries. Multiple filling defects in the distal main pulmonary arteries, extending into bilateral upper and lower lobe lobar and segmental branches consistent with multiple pulmonary emboli. RV to LV ratio is 1.5 suggesting evidence of right heart strain. No pericardial effusions. Normal caliber thoracic aorta. No aortic dissection. Mediastinum/Nodes: Mediastinal lymph nodes are not pathologically enlarged, likely reactive. Esophagus is decompressed. Lungs/Pleura: Motion artifact limits examination. Nodular airspace infiltrates in the lung bases likely indicate COVID pneumonia. Upper Abdomen: No acute process demonstrated in the visualized upper abdomen. Musculoskeletal: Degenerative changes in the  spine. No destructive bone lesions. Review of the MIP images confirms the above findings. IMPRESSION: 1. Positive examination for multiple bilateral pulmonary emboli with CT evidence of right heart strain (RV/LV Ratio = 1.5) consistent with at least submassive (intermediate risk) PE. The presence of right heart strain has been associated with an increased risk of morbidity and mortality. 2. Nodular airspace infiltrates in the lung bases likely indicate COVID pneumonia. Critical Value/emergent results were called by telephone at the time of interpretation on 10/10/2019 at 4:36 am to provider  [place] VERONESE , who verbally acknowledged these results. Electronically Signed   By: Lucienne Capers M.D.   On: 10/10/2019 04:40   PERIPHERAL VASCULAR CATHETERIZATION  Result Date: 10/10/2019 See op note  US Venous Img Lower Bilateral (DVT)  Result Date: 10/10/2019 CLINICAL DATA:  Known pulmonary emboli EXAM: BILATERAL LOWER EXTREMITY VENOUS DOPPLER ULTRASOUND TECHNIQUE: Gray-scale sonography with graded compression, as well as color Doppler and duplex ultrasound were performed to evaluate the lower extremity deep venous systems from the level of the common femoral vein and including the common femoral, femoral, profunda femoral, popliteal and calf veins including the posterior tibial, peroneal and gastrocnemius veins when visible. The superficial great saphenous vein was also interrogated. Spectral Doppler was utilized to evaluate flow at rest and with distal augmentation maneuvers in the common femoral, femoral and popliteal veins. COMPARISON:  None. FINDINGS: RIGHT LOWER EXTREMITY Common Femoral Vein: No evidence of thrombus. Normal compressibility, respiratory phasicity and response to augmentation. Saphenofemoral Junction: No evidence of thrombus. Normal compressibility and flow on color Doppler imaging. Profunda Femoral Vein: No evidence of thrombus. Normal compressibility and flow on color Doppler imaging. Femoral  Vein: No evidence of thrombus. Normal compressibility, respiratory phasicity and response to augmentation. Popliteal Vein: No evidence of thrombus. Normal compressibility, respiratory phasicity and response to augmentation. Calf Veins: Posterior tibial veins are within normal limits. Peroneal veins are not visualized. Superficial Great Saphenous Vein: No evidence of thrombus. Normal compressibility. Venous Reflux:  None. Other Findings:  None. LEFT LOWER EXTREMITY Common Femoral Vein: No evidence of thrombus. Normal compressibility, respiratory phasicity and response to augmentation. Saphenofemoral Junction: No evidence of thrombus. Normal compressibility and flow on color Doppler imaging. Profunda Femoral Vein: No evidence of thrombus. Normal compressibility and flow on color Doppler imaging. Femoral Vein: No evidence of thrombus. Normal compressibility, respiratory phasicity and response to augmentation. Popliteal Vein: No evidence of thrombus. Normal compressibility, respiratory phasicity and response to augmentation. Calf Veins: Posterior tibial veins are within normal limits. Peroneal veins are not visualized. Superficial Great Saphenous Vein: No evidence of thrombus. Normal compressibility. Venous Reflux:  None. Other Findings:  None. IMPRESSION: No evidence of deep venous thrombosis in either lower extremity. Electronically Signed   By: Inez Catalina M.D.   On: 10/10/2019 16:16   ECHOCARDIOGRAM COMPLETE  Result Date: 10/10/2019    ECHOCARDIOGRAM REPORT   Patient Name:   AEISHA MINARIK Date of Exam: 10/10/2019 Medical Rec #:  [id number]   Height:       64.0 in Accession #:    [id number]  Weight:       297.6 lb Date of Birth:  [birth year]  BSA:          2.316 m Patient Age:    64 years    BP:           155/86 mmHg Patient Gender: F           HR:           80 bpm. Exam Location:  [facility] Procedure: 2D Echo, Cardiac Doppler and Color Doppler Indications:     Pulmonary embolus 415.19  History:         Patient has no  prior history of Echocardiogram examinations. No                  cardiac history listed in chart.  Sonographer:     Sherrie Sport RDCS (AE) Referring Phys:  [id number] Arvella Merles [place] Diagnosing Phys: Yolonda Kida MD  Sonographer Comments: Technically difficult  study due to poor echo windows. IMPRESSIONS  1. Left ventricular ejection fraction, by estimation, is 55 to 60%. The left ventricle has normal function. The left ventricle has no regional wall motion abnormalities. Left ventricular diastolic parameters are consistent with Grade II diastolic dysfunction (pseudonormalization).  2. Right ventricular systolic function is normal. The right ventricular size is normal.  3. The mitral valve is grossly normal. Trivial mitral valve regurgitation. Moderate mitral annular calcification.  4. The aortic valve is grossly normal. Aortic valve regurgitation is not visualized. FINDINGS  Left Ventricle: Left ventricular ejection fraction, by estimation, is 55 to 60%. The left ventricle has normal function. The left ventricle has no regional wall motion abnormalities. The left ventricular internal cavity size was normal in size. There is  no left ventricular hypertrophy. Left ventricular diastolic parameters are consistent with Grade II diastolic dysfunction (pseudonormalization). Right Ventricle: The right ventricular size is normal. No increase in right ventricular wall thickness. Right ventricular systolic function is normal. Left Atrium: Left atrial size was normal in size. Right Atrium: Right atrial size was normal in size. Pericardium: There is no evidence of pericardial effusion. Mitral Valve: The mitral valve is grossly normal. Moderate mitral annular calcification. Trivial mitral valve regurgitation. Tricuspid Valve: The tricuspid valve is normal in structure. Tricuspid valve regurgitation is not demonstrated. Aortic Valve: The aortic valve is grossly normal. Aortic valve regurgitation is not visualized. Aortic valve mean  gradient measures 7.3 mmHg. Aortic valve peak gradient measures 11.6 mmHg. Aortic valve area, by VTI measures 3.35 cm. Pulmonic Valve: The pulmonic valve was normal in structure. Pulmonic valve regurgitation is not visualized. Aorta: The aortic arch was not well visualized. IAS/Shunts: No atrial level shunt detected by color flow Doppler.  LEFT VENTRICLE PLAX 2D LVIDd:         3.93 cm  Diastology LVIDs:         2.69 cm  LV e' medial:    4.68 cm/s LV PW:         1.20 cm  LV E/e' medial:  14.1 LV IVS:        1.87 cm  LV e' lateral:   5.00 cm/s LVOT diam:     2.00 cm  LV E/e' lateral: 13.2 LV SV:         112 LV SV Index:   48 LVOT Area:     3.14 cm  RIGHT VENTRICLE RV Basal diam:  3.41 cm RV S prime:     17.30 cm/s TAPSE (M-mode): 3.6 cm LEFT ATRIUM             Index       RIGHT ATRIUM           Index LA diam:        3.70 cm 1.60 cm/m  RA Area:     25.40 cm LA Vol (A2C):   73.1 ml 31.56 ml/m RA Volume:   85.40 ml  36.87 ml/m LA Vol (A4C):   76.5 ml 33.02 ml/m LA Biplane Vol: 77.6 ml 33.50 ml/m  AORTIC VALVE                    PULMONIC VALVE AV Area (Vmax):    2.48 cm     PV Vmax:        0.62 m/s AV Area (Vmean):   2.52 cm     PV Peak grad:   1.5 mmHg AV Area (VTI):     3.35 cm     RVOT  Peak grad: 3 mmHg AV Vmax:           170.00 cm/s AV Vmean:          119.000 cm/s AV VTI:            0.334 m AV Peak Grad:      11.6 mmHg AV Mean Grad:      7.3 mmHg LVOT Vmax:         134.00 cm/s LVOT Vmean:        95.600 cm/s LVOT VTI:          0.357 m LVOT/AV VTI ratio: 1.07  AORTA Ao Root diam: 3.30 cm MITRAL VALVE                TRICUSPID VALVE MV Area (PHT): 3.77 cm     TR Peak grad:   11.7 mmHg MV Decel Time: 201 msec     TR Vmax:        171.00 cm/s MV E velocity: 66.00 cm/s MV A velocity: 112.00 cm/s  SHUNTS MV E/A ratio:  0.59         Systemic VTI:  0.36 m                             Systemic Diam: 2.00 cm Dwayne Prince [place] MD Electronically signed by Yolonda Kida MD Signature Date/Time: 10/10/2019/5:11:00 PM     Final    Microbiology: Recent Results (from the past 240 hour(s))  SARS Coronavirus 2 by RT PCR (hospital order, performed in [facility] hospital lab) Nasopharyngeal Nasopharyngeal Swab     Status: Abnormal   Collection Time: 10/09/19 11:24 PM   Specimen: Nasopharyngeal Swab  Result Value Ref Range Status   SARS Coronavirus 2 POSITIVE (A) NEGATIVE Final    Comment: RESULT CALLED TO, READ BACK BY AND VERIFIED WITH: Mosetta Pigeon RN [phone] 10/10/19 [facility] (NOTE) SARS-CoV-2 target nucleic acids are DETECTED  SARS-CoV-2 RNA is generally detectable in upper respiratory specimens  during the acute phase of infection.  Positive results are indicative  of the presence of the identified virus, but do not rule out bacterial infection or co-infection with other pathogens not detected by the test.  Clinical correlation with patient history and  other diagnostic information is necessary to determine patient infection status.  The expected result is negative.  Fact Sheet for Patients:   StrictlyIdeas.no   Fact Sheet for Healthcare Providers:   BankingDealers.co.za    This test is not yet approved or cleared by the [place] FDA and  has been authorized for detection and/or diagnosis of SARS-CoV-2 by FDA under an Emergency Use Authorization (EUA).  This EUA will remain in effect (meaning this test  can be used) for the duration of  the COVID-19 declaration under Section 564(b)(1) of the Act, 21 U.S.C. section 360-bbb-3(b)(1), unless the authorization is terminated or revoked sooner.  Performed at [facility], [address] [place], [place] [address]    Labs: Basic Metabolic Panel: Recent Labs  Lab 10/09/19 2324 10/11/19 0532 10/12/19 0408 10/13/19 0454  NA 140 142 140 139  K 3.8 3.5 3.5 3.8  CL 104 107 105 103  CO2 25 27 27 27   GLUCOSE 125* 127* 156* 130*  BUN 15 16 20 23   CREATININE 0.69 0.59 0.53 0.66  CALCIUM 8.3*  8.0* 8.4* 8.4*  MG  --  1.7 1.6* 1.9   Liver Function Tests: Recent Labs  Lab  10/11/19 0532 10/12/19 0408 10/13/19 0454  AST 36 26 21  ALT 26 26 23   ALKPHOS 50 55 57  BILITOT 0.9 0.6 0.8  PROT 5.7* 6.1* 6.5  ALBUMIN 3.0* 3.3* 3.5  CBC: Recent Labs  Lab 10/09/19 2324 10/11/19 0532 10/12/19 0408 10/13/19 0454  WBC 5.1 6.2 7.1 12.1*  NEUTROABS  --  3.8 5.9 10.2*  HGB 13.1 11.8* 11.5* 12.7  HCT 39.5 34.3* 35.4* 38.7  MCV 86.4 84.1 86.1 84.9  PLT 132* 137* 153 199   BNP: BNP (last 3 results) Recent Labs    10/09/19 2324  BNP 66.1    Time spent: 25 minutes  Signed:  Para Skeans MD.  [facility] 10/13/2019, 2:26 PM

## 2019-10-13 NOTE — Progress Notes (Signed)
Ambulated patient in room for 5-6 minutes.  Patient dropped once to 89%.  Practiced deep breathing exercises and walking slowly and she was able to maintain O2 above 93% on RA.   Patient tolerated ambulation very well.   Denied dizziness, worsening SOB, and CP.    Paged MD

## 2019-10-13 NOTE — Progress Notes (Signed)
Discussed discharge instruction with patient, including follow up appointments and medications.    Encourage patient to monitor cath site for infection and bleeding; monitor BP and take PRN medication according to parameters.     Sent home discharge instructions, including handout on femoral site care.

## 2019-10-13 NOTE — Plan of Care (Signed)
  Problem: Health Behavior/Discharge Planning: Goal: Ability to manage health-related needs will improve Outcome: Progressing   Problem: Clinical Measurements: Goal: Respiratory complications will improve Outcome: Progressing   Problem: Activity: Goal: Risk for activity intolerance will decrease Outcome: Progressing   Problem: Pain Managment: Goal: General experience of comfort will improve Outcome: Progressing   Problem: Safety: Goal: Ability to remain free from injury will improve Outcome: Progressing   

## 2019-10-13 NOTE — Patient Instructions (Signed)
You are scheduled for a Remdesivir infusion on 9/13 and 9/13 at 11AM Please come to [facility], you will see a COVID infusion banner by the road.  Enter there and turn left.  There are marked spaces for Infusion. Call the number on the sign or [phone] and someone will come out and bring you inside. If someone is driving you please come to the same area and call the number and someone will come outside to get you. Thank you!

## 2019-10-13 NOTE — Discharge Instructions (Signed)
You are scheduled for a Remdesivir infusion on 9/13 and 9/14 at 11AM. Please come to [place], you will see a COVID infusion banner by the road.  Enter there and turn left.  There are marked spaces for Infusion. Call the number on the sign or [phone] and someone will come out and bring you inside. If someone is driving you please come to the same area and call the number and someone will come outside to get you. Thank you!      10 Things You Can Do to Manage Your COVID-19 Symptoms at Home If you have possible or confirmed COVID-19: 1. Stay home from work and school. And stay away from other public places. If you must go out, avoid using any kind of public transportation, ridesharing, or taxis. 2. Monitor your symptoms carefully. If your symptoms get worse, call your healthcare provider immediately. 3. Get rest and stay hydrated. 4. If you have a medical appointment, call the healthcare provider ahead of time and tell them that you have or may have COVID-19. 5. For medical emergencies, call 911 and notify the dispatch personnel that you have or may have COVID-19. 6. Cover your cough and sneezes with a tissue or use the inside of your elbow. 7. Wash your hands often with soap and water for at least 20 seconds or clean your hands with an alcohol-based hand sanitizer that contains at least 60% alcohol. 8. As much as possible, stay in a specific room and away from other people in your home. Also, you should use a separate bathroom, if available. If you need to be around other people in or outside of the home, wear a mask. 9. Avoid sharing personal items with other people in your household, like dishes, towels, and bedding. 10. Clean all surfaces that are touched often, like counters, tabletops, and doorknobs. Use household cleaning sprays or wipes according to the label instructions. [email or web address] 08/01/2018 This information is not intended to replace advice given to you by your health  care provider. Make sure you discuss any questions you have with your health care provider. Document Revised: 01/03/2019 Document Reviewed: 01/03/2019 Elsevier Patient Education  [place].  3 Key Steps to Take While Waiting for Your COVID-19 Test Result To help stop the spread of COVID-19, take these 3 key steps NOW while waiting for your test results: 1. Stay home and monitor your health. Stay home and monitor your health to help protect your friends, family, and others from possibly getting COVID-19 from you. Stay home and away from others:  If possible, stay away from others, especially people who are at higher risk for getting very sick from COVID-19, such as older adults and people with other medical conditions.  If you have been in contact with someone with COVID-19, stay home and away from others for 14 days after your last contact with that person.  If you have a fever, cough or other symptoms of COVID-19, stay home and away from others (except to get medical care). Monitor your health:  Watch for fever, cough, shortness of breath, or other symptoms of COVID-19. Remember, symptoms may appear 2-14 days after exposure to COVID-19 and can include: ? Fever or chills ? Cough ? Shortness of breath or difficulty breathing ? Tiredness ? Muscle or body aches ? Headache ? New loss of taste or smell ? Sore throat ? Congestion or runny nose ? Nausea or vomiting ? Diarrhea 2. Think about the people you have recently  been around. If you are diagnosed with [id number], a public health worker may call you to check on your health, discuss who you have been around, and ask where you spent time while you may have been able to spread COVID-19 to others. While you wait for your COVID-19 test result, think about everyone you have been around recently. This will be important information to give health workers if your test is positive.  Complete the information on the back of this page to help  you remember everyone you have been around. 3. Answer the phone call from the health department. If a public health worker calls you, answer the call to help slow the spread of COVID-19 in your community.  Discussions with health department staff are confidential. This means that your personal and medical information will be kept private and only shared with those who may need to know, like your health care provider.  Your name will not be shared with those you came in contact with. The health department will only notify people you were in close contact with (within 6 feet for more than 15 minutes) that they might have been exposed to COVID-19. Think about the people you have recently been around If you test positive and are diagnosed with COVID-19, someone from the health department may call to check-in on your health, discuss who you have been around, and ask where you spent time while you may have been able to spread COVID-19 to others. This form can help you think about people you have recently been around so you will be ready if a public health worker calls you. Things to think about. Have you:  Gone to work or school?  Gotten together with others (eaten out at [employer], gone out for drinks, exercised with others or gone to a gym, had friends or family over to your house, volunteered, gone to a party, pool, or park)?  Gone to a store in person (e.g., grocery store, mall)?  Gone to in-person appointments (e.g., salon, barber, doctor's or dentist's office)?  Ridden in a car with others (e.g., [employer] or [employer]) or took public transportation?  Been inside a church, synagogue, mosque or other places of worship? Who lives with  you?  ______________________________________________________________________  ______________________________________________________________________  ______________________________________________________________________  ______________________________________________________________________ Who have you been around (within 6 feet for more than 15 minutes) in the last 10 days? (You may have more people to list than the space provided. If so, write on the front of this sheet or a separate piece of paper.) Name ______________________________________________  Phone number ____________________________________  Date you last saw them _____________________________  Where you last saw them ________________________________________________ Name ______________________________________________  Phone number ____________________________________  Date you last saw them _____________________________  Where you last saw them ________________________________________________ Name ______________________________________________  Phone number ____________________________________  Date you last saw them _____________________________  Where you last saw them ________________________________________________ Name ______________________________________________  Phone number ____________________________________  Date you last saw them _____________________________  Where you last saw them ________________________________________________ Name ______________________________________________  Phone number ____________________________________  Date you last saw them _____________________________  Where you last saw them ________________________________________________ What have you done in the last 10 days with other people? Activity _____________________________________________  Location _________________________________________  Date ____________________________________________ Activity  _____________________________________________  Location _________________________________________  Date ____________________________________________ Activity _____________________________________________  Location _________________________________________  Date ____________________________________________ Activity _____________________________________________  Location _________________________________________  Date ____________________________________________ Activity _____________________________________________  Location _________________________________________  Date ____________________________________________ [email or web address] 08/27/2018 This information is not intended to replace advice given to  you by your health care provider. Make sure you discuss any questions you have with your health care provider. Document Revised: 01/03/2019 Document Reviewed: 01/03/2019 Elsevier Patient Education  [place].  COVID-19: How to Protect Yourself and Others Know how it spreads  There is currently no vaccine to prevent coronavirus disease 2019 (COVID-19).  The best way to prevent illness is to avoid being exposed to this virus.  The virus is thought to spread mainly from person-to-person. ? Between people who are in close contact with one another (within about 6 feet). ? Through respiratory droplets produced when an infected person coughs, sneezes or talks. ? These droplets can land in the mouths or noses of people who are nearby or possibly be inhaled into the lungs. ? COVID-19 may be spread by people who are not showing symptoms. Everyone should Clean your hands often  Wash your hands often with soap and water for at least 20 seconds especially after you have been in a public place, or after blowing your nose, coughing, or sneezing.  If soap and water are not readily available, use a hand sanitizer that contains at least 60% alcohol. Cover all surfaces of your hands  and rub them together until they feel dry.  Avoid touching your eyes, nose, and mouth with unwashed hands. Avoid close contact  Limit contact with others as much as possible.  Avoid close contact with people who are sick.  Put distance between yourself and other people. ? Remember that some people without symptoms may be able to spread virus. ? This is especially important for people who are at higher risk of getting very GainPain.com.cy Cover your mouth and nose with a mask when around others  You could spread COVID-19 to others even if you do not feel sick.  Everyone should wear a mask in public settings and when around people not living in their household, especially when social distancing is difficult to maintain. ? Masks should not be placed on young children under age 11, anyone who has trouble breathing, or is unconscious, incapacitated or otherwise unable to remove the mask without assistance.  The mask is meant to protect other people in case you are infected.  Do NOT use a facemask meant for a Dietitian.  Continue to keep about 6 feet between yourself and others. The mask is not a substitute for social distancing. Cover coughs and sneezes  Always cover your mouth and nose with a tissue when you cough or sneeze or use the inside of your elbow.  Throw used tissues in the trash.  Immediately wash your hands with soap and water for at least 20 seconds. If soap and water are not readily available, clean your hands with a hand sanitizer that contains at least 60% alcohol. Clean and disinfect  Clean AND disinfect frequently touched surfaces daily. This includes tables, doorknobs, light switches, countertops, handles, desks, phones, keyboards, toilets, faucets, and sinks. [email or web address][employer][email or web address]  If surfaces are dirty, clean them: Use  detergent or soap and water prior to disinfection.  Then, use a household disinfectant. You can see a list of EPA-registered household disinfectants here. [email or web address] 10/03/2018 This information is not intended to replace advice given to you by your health care provider. Make sure you discuss any questions you have with your health care provider. Document Revised: 10/11/2018 Document Reviewed: 08/09/2018 Elsevier Patient Education  2020 [place].   COVID-19 COVID-19 is a respiratory infection that is caused by a virus called severe acute respiratory syndrome  coronavirus 2 (SARS-CoV-2). The disease is also known as coronavirus disease or novel coronavirus. In some people, the virus may not cause any symptoms. In others, it may cause a serious infection. The infection can get worse quickly and can lead to complications, such as:  Pneumonia, or infection of the lungs.  Acute respiratory distress syndrome or ARDS. This is a condition in which fluid build-up in the lungs prevents the lungs from filling with air and passing oxygen into the blood.  Acute respiratory failure. This is a condition in which there is not enough oxygen passing from the lungs to the body or when carbon dioxide is not passing from the lungs out of the body.  Sepsis or septic shock. This is a serious bodily reaction to an infection.  Blood clotting problems.  Secondary infections due to bacteria or fungus.  Organ failure. This is when your body's organs stop working. The virus that causes COVID-19 is contagious. This means that it can spread from person to person through droplets from coughs and sneezes (respiratory secretions). What are the causes? This illness is caused by a virus. You may catch the virus by:  Breathing in droplets from an infected person. Droplets can be spread by a person breathing, speaking, singing, coughing, or sneezing.  Touching something, like a table or a doorknob, that was  exposed to the virus (contaminated) and then touching your mouth, nose, or eyes. What increases the risk? Risk for infection You are more likely to be infected with this virus if you:  Are within 6 feet (2 meters) of a person with COVID-19.  Provide care for or live with a person who is infected with COVID-19.  Spend time in crowded indoor spaces or live in shared housing. Risk for serious illness You are more likely to become seriously ill from the virus if you:  Are 9 years of age or older. The higher your age, the more you are at risk for serious illness.  Live in a nursing home or long-term care facility.  Have cancer.  Have a long-term (chronic) disease such as: ? Chronic lung disease, including chronic obstructive pulmonary disease or asthma. ? A long-term disease that lowers your body's ability to fight infection (immunocompromised). ? Heart disease, including heart failure, a condition in which the arteries that lead to the heart become narrow or blocked (coronary artery disease), a disease which makes the heart muscle thick, weak, or stiff (cardiomyopathy). ? Diabetes. ? Chronic kidney disease. ? Sickle cell disease, a condition in which red blood cells have an abnormal "sickle" shape. ? Liver disease.  Are obese. What are the signs or symptoms? Symptoms of this condition can range from mild to severe. Symptoms may appear any time from 2 to 14 days after being exposed to the virus. They include:  A fever or chills.  A cough.  Difficulty breathing.  Headaches, body aches, or muscle aches.  Runny or stuffy (congested) nose.  A sore throat.  New loss of taste or smell. Some people may also have stomach problems, such as nausea, vomiting, or diarrhea. Other people may not have any symptoms of COVID-19. How is this diagnosed? This condition may be diagnosed based on:  Your signs and symptoms, especially if: ? You live in an area with a COVID-19 outbreak. ? You  recently traveled to or from an area where the virus is common. ? You provide care for or live with a person who was diagnosed with COVID-19. ? You were  exposed to a person who was diagnosed with COVID-19.  A physical exam.  Lab tests, which may include: ? Taking a sample of fluid from the back of your nose and throat (nasopharyngeal fluid), your nose, or your throat using a swab. ? A sample of mucus from your lungs (sputum). ? Blood tests.  Imaging tests, which may include, X-rays, CT scan, or ultrasound. How is this treated? At present, there is no medicine to treat COVID-19. Medicines that treat other diseases are being used on a trial basis to see if they are effective against COVID-19. Your health care provider will talk with you about ways to treat your symptoms. For most people, the infection is mild and can be managed at home with rest, fluids, and over-the-counter medicines. Treatment for a serious infection usually takes places in a hospital intensive care unit (ICU). It may include one or more of the following treatments. These treatments are given until your symptoms improve.  Receiving fluids and medicines through an IV.  Supplemental oxygen. Extra oxygen is given through a tube in the nose, a face mask, or a hood.  Positioning you to lie on your stomach (prone position). This makes it easier for oxygen to get into the lungs.  Continuous positive airway pressure (CPAP) or bi-level positive airway pressure (BPAP) machine. This treatment uses mild air pressure to keep the airways open. A tube that is connected to a motor delivers oxygen to the body.  Ventilator. This treatment moves air into and out of the lungs by using a tube that is placed in your windpipe.  Tracheostomy. This is a procedure to create a hole in the neck so that a breathing tube can be inserted.  Extracorporeal membrane oxygenation (ECMO). This procedure gives the lungs a chance to recover by taking over the  functions of the heart and lungs. It supplies oxygen to the body and removes carbon dioxide. Follow these instructions at home: Lifestyle  If you are sick, stay home except to get medical care. Your health care provider will tell you how long to stay home. Call your health care provider before you go for medical care.  Rest at home as told by your health care provider.  Do not use any products that contain nicotine or tobacco, such as cigarettes, e-cigarettes, and chewing tobacco. If you need help quitting, ask your health care provider.  Return to your normal activities as told by your health care provider. Ask your health care provider what activities are safe for you. General instructions  Take over-the-counter and prescription medicines only as told by your health care provider.  Drink enough fluid to keep your urine pale yellow.  Keep all follow-up visits as told by your health care provider. This is important. How is this prevented?  There is no vaccine to help prevent COVID-19 infection. However, there are steps you can take to protect yourself and others from this virus. To protect yourself:   Do not travel to areas where COVID-19 is a risk. The areas where COVID-19 is reported change often. To identify high-risk areas and travel restrictions, check the CDC travel website: FatFares.com.br  If you live in, or must travel to, an area where COVID-19 is a risk, take precautions to avoid infection. ? Stay away from people who are sick. ? Wash your hands often with soap and water for 20 seconds. If soap and water are not available, use an alcohol-based hand sanitizer. ? Avoid touching your mouth, face, eyes, or nose. ?  Avoid going out in public, follow guidance from your state and local health authorities. ? If you must go out in public, wear a cloth face covering or face mask. Make sure your mask covers your nose and mouth. ? Avoid crowded indoor spaces. Stay at least 6  feet (2 meters) away from others. ? Disinfect objects and surfaces that are frequently touched every day. This may include:  Counters and tables.  Doorknobs and light switches.  Sinks and faucets.  Electronics, such as phones, remote controls, keyboards, computers, and tablets. To protect others: If you have symptoms of COVID-19, take steps to prevent the virus from spreading to others.  If you think you have a COVID-19 infection, contact your health care provider right away. Tell your health care team that you think you may have a COVID-19 infection.  Stay home. Leave your house only to seek medical care. Do not use public transport.  Do not travel while you are sick.  Wash your hands often with soap and water for 20 seconds. If soap and water are not available, use alcohol-based hand sanitizer.  Stay away from other members of your household. Let healthy household members care for children and pets, if possible. If you have to care for children or pets, wash your hands often and wear a mask. If possible, stay in your own room, separate from others. Use a different bathroom.  Make sure that all people in your household wash their hands well and often.  Cough or sneeze into a tissue or your sleeve or elbow. Do not cough or sneeze into your hand or into the air.  Wear a cloth face covering or face mask. Make sure your mask covers your nose and mouth. Where to find more information  Centers for Disease Control and Prevention: PurpleGadgets.be  World Health Organization: https://www.castaneda.info/ Contact a health care provider if:  You live in or have traveled to an area where COVID-19 is a risk and you have symptoms of the infection.  You have had contact with someone who has COVID-19 and you have symptoms of the infection. Get help right away if:  You have trouble breathing.  You have pain or pressure in your chest.  You have  confusion.  You have bluish lips and fingernails.  You have difficulty waking from sleep.  You have symptoms that get worse. These symptoms may represent a serious problem that is an emergency. Do not wait to see if the symptoms will go away. Get medical help right away. Call your local emergency services (911 in the [place] Do not drive yourself to the hospital. Let the emergency medical personnel know if you think you have COVID-19. Summary  COVID-19 is a respiratory infection that is caused by a virus. It is also known as coronavirus disease or novel coronavirus. It can cause serious infections, such as pneumonia, acute respiratory distress syndrome, acute respiratory failure, or sepsis.  The virus that causes COVID-19 is contagious. This means that it can spread from person to person through droplets from breathing, speaking, singing, coughing, or sneezing.  You are more likely to develop a serious illness if you are 69 years of age or older, have a weak immune system, live in a nursing home, or have chronic disease.  There is no medicine to treat COVID-19. Your health care provider will talk with you about ways to treat your symptoms.  Take steps to protect yourself and others from infection. Wash your hands often and disinfect objects and  surfaces that are frequently touched every day. Stay away from people who are sick and wear a mask if you are sick. This information is not intended to replace advice given to you by your health care provider. Make sure you discuss any questions you have with your health care provider. Document Revised: 11/16/2018 Document Reviewed: 02/22/2018 Elsevier Patient Education  [place].

## 2019-10-13 NOTE — Progress Notes (Signed)
The patient is scheduled for a Remdesivir infusion on 9/13 and 9/14 at 11AM. Have the patient come to [place], they will see a COVID infusion banner by the road.  Enter there and turn left. There are marked spaces for Infusion.  Call the number on the sign or [phone] and someone will come out and bring them inside.  If someone is driving them, have them come to the same area and call the number and someone will come outside to get them. Thank you!

## 2019-10-14 ENCOUNTER — Ambulatory Visit (HOSPITAL_COMMUNITY): Payer: Medicare Other

## 2019-10-14 ENCOUNTER — Telehealth: Payer: Self-pay

## 2019-10-14 DIAGNOSIS — J1282 Pneumonia due to coronavirus disease 2019: Secondary | ICD-10-CM | POA: Insufficient documentation

## 2019-10-14 DIAGNOSIS — U071 COVID-19: Secondary | ICD-10-CM | POA: Insufficient documentation

## 2019-10-14 NOTE — Telephone Encounter (Signed)
Transition Care Management Follow-up Telephone Call  Date of discharge and from where: [facility] 10/13/2019  How have you been since you were released from the hospital? 100% better  Any questions or concerns? No  Items Reviewed:  Did the pt receive and understand the discharge instructions provided? Yes   Medications obtained and verified? Yes   Any new allergies since your discharge? No   Dietary orders reviewed? Yes  Do you have support at home? Yes   Functional Questionnaire: (I = Independent and D = Dependent) ADLs: I  Bathing/Dressing- I  Meal Prep- I  Eating- I  Maintaining continence- I  Transferring/Ambulation- I  Managing Meds- I  Follow up appointments reviewed:   PCP Hospital f/u appt confirmed? Will be scheduled by office.  Are transportation arrangements needed? No   If their condition worsens, is the pt aware to call PCP or go to the Emergency Dept.? Yes  Was the patient provided with contact information for the PCP's office or ED? Yes  Was to pt encouraged to call back with questions or concerns? Yes

## 2019-10-15 ENCOUNTER — Ambulatory Visit (HOSPITAL_COMMUNITY)
Admit: 2019-10-15 | Discharge: 2019-10-15 | Disposition: A | Discharge status: Home / Self Care | Payer: Medicare Other | Attending: Pulmonary Disease | Admitting: Pulmonary Disease

## 2019-10-15 DIAGNOSIS — U071 COVID-19: Secondary | ICD-10-CM | POA: Diagnosis not present

## 2019-10-15 DIAGNOSIS — J1282 Pneumonia due to coronavirus disease 2019: Secondary | ICD-10-CM | POA: Diagnosis not present

## 2019-10-15 MED ORDER — EPINEPHRINE 0.3 MG/0.3ML IJ SOAJ: 0.3000 mg | Freq: Once | INTRAMUSCULAR | Status: DC | PRN

## 2019-10-15 MED ORDER — METHYLPREDNISOLONE SODIUM SUCC 125 MG IJ SOLR: 125.0000 mg | Freq: Once | INTRAMUSCULAR | Status: DC | PRN

## 2019-10-15 MED ORDER — SODIUM CHLORIDE 0.9 % IV SOLN: INTRAVENOUS | Status: DC | PRN

## 2019-10-15 MED ORDER — SODIUM CHLORIDE 0.9 % IV SOLN
100.0000 mg | Freq: Once | INTRAVENOUS | Status: AC
  Administered 2019-10-15: 100 mg via INTRAVENOUS
  Filled 2019-10-15: qty 20

## 2019-10-15 MED ORDER — FAMOTIDINE IN NACL 20-0.9 MG/50ML-% IV SOLN: 20.0000 mg | Freq: Once | INTRAVENOUS | Status: DC | PRN

## 2019-10-15 MED ORDER — ALBUTEROL SULFATE HFA 108 (90 BASE) MCG/ACT IN AERS: 2.0000 | INHALATION_SPRAY | Freq: Once | RESPIRATORY_TRACT | Status: DC | PRN

## 2019-10-15 MED ORDER — DIPHENHYDRAMINE HCL 50 MG/ML IJ SOLN: 50.0000 mg | Freq: Once | INTRAMUSCULAR | Status: DC | PRN

## 2019-10-16 ENCOUNTER — Ambulatory Visit: Payer: Self-pay

## 2019-10-16 NOTE — Telephone Encounter (Signed)
Will need appt if questions not answered by infusion center

## 2019-10-16 NOTE — Telephone Encounter (Signed)
FYI

## 2019-10-16 NOTE — Telephone Encounter (Signed)
Pt. Reports she had her antibody infusion yesterday. Today she has a "feeling in my mouth like my tongue is swollen, but it's not." Has nausea as well. Denies difficulty breathing. Eating and drinking without difficulty. No pain in mouth, no sores. "I think it's from the infusion." Will call infusion hotline as well. Please advise.   Answer Assessment - Initial Assessment Questions 1. SYMPTOM: "What's the main symptom you're concerned about?" (e.g., dry mouth. chapped lips, lump)     Tongue feels swollen and "mushy" 2. ONSET: "When did the  feeling start?"     Today 3. PAIN: "Is there any pain?" If Yes, ask: "How bad is it?" (Scale: 1-10; mild, moderate, severe)     No 4. CAUSE: "What do you think is causing the symptoms?"     Maybe the infusion 5. OTHER SYMPTOMS: "Do you have any other symptoms?" (e.g., fever, sore throat, toothache, swelling)     No 6. PREGNANCY: "Is there any chance you are pregnant?" "When was your last menstrual period?"     No  Protocols used: MOUTH [facility]

## 2019-10-16 NOTE — Telephone Encounter (Signed)
Patient notified of Dr. Durenda Age message. Patient is calling the infusion center now to see what they say. Will call us back if need be.

## 2019-11-05 ENCOUNTER — Telehealth: Payer: Self-pay | Admitting: Nurse Practitioner

## 2019-11-05 ENCOUNTER — Other Ambulatory Visit: Payer: Self-pay

## 2019-11-05 MED ORDER — FEXOFENADINE HCL 180 MG PO TABS: 180.0000 mg | ORAL_TABLET | Freq: Every day | ORAL | 1 refills | Status: DC

## 2019-11-05 NOTE — Telephone Encounter (Signed)
Medication Refill - Medication: Allegra  Has the patient contacted their pharmacy? Yes.   (Agent: If no, request that the patient contact the pharmacy for the refill.) (Agent: If yes, when and what did the pharmacy advise?)  Preferred Pharmacy (with phone number or street name):  [id number] [place] [address]  [address] [place] [address]  Phone: [phone] Fax: [phone]  Hours: Not open 24 hours     Agent: Please be advised that RX refills may take up to 3 business days. We ask that you follow-up with your pharmacy.

## 2019-11-05 NOTE — Telephone Encounter (Signed)
Pt requesting a prescription for this OTC med Allegra- routing to office

## 2019-11-10 ENCOUNTER — Other Ambulatory Visit: Payer: Self-pay | Admitting: Nurse Practitioner

## 2019-11-15 ENCOUNTER — Ambulatory Visit (INDEPENDENT_AMBULATORY_CARE_PROVIDER_SITE_OTHER): Payer: Self-pay | Admitting: Vascular Surgery

## 2019-11-19 ENCOUNTER — Other Ambulatory Visit: Payer: Self-pay

## 2019-11-19 ENCOUNTER — Encounter: Payer: Self-pay | Admitting: Nurse Practitioner

## 2019-11-19 ENCOUNTER — Ambulatory Visit (INDEPENDENT_AMBULATORY_CARE_PROVIDER_SITE_OTHER): Payer: Medicare Other | Admitting: Nurse Practitioner

## 2019-11-19 VITALS — BP 121/79 | HR 62 | Temp 97.5°F | Resp 16 | Ht 63.0 in | Wt 290.6 lb

## 2019-11-19 DIAGNOSIS — I2699 Other pulmonary embolism without acute cor pulmonale: Secondary | ICD-10-CM

## 2019-11-19 DIAGNOSIS — Z6841 Body Mass Index (BMI) 40.0 and over, adult: Secondary | ICD-10-CM | POA: Diagnosis not present

## 2019-11-19 DIAGNOSIS — E7800 Pure hypercholesterolemia, unspecified: Secondary | ICD-10-CM | POA: Diagnosis not present

## 2019-11-19 DIAGNOSIS — U071 COVID-19: Secondary | ICD-10-CM

## 2019-11-19 DIAGNOSIS — I10 Essential (primary) hypertension: Secondary | ICD-10-CM

## 2019-11-19 DIAGNOSIS — J9601 Acute respiratory failure with hypoxia: Secondary | ICD-10-CM

## 2019-11-19 DIAGNOSIS — I890 Lymphedema, not elsewhere classified: Secondary | ICD-10-CM | POA: Insufficient documentation

## 2019-11-19 DIAGNOSIS — I5021 Acute systolic (congestive) heart failure: Secondary | ICD-10-CM | POA: Diagnosis not present

## 2019-11-19 DIAGNOSIS — R600 Localized edema: Secondary | ICD-10-CM | POA: Insufficient documentation

## 2019-11-19 MED ORDER — FUROSEMIDE 20 MG PO TABS: 20.0000 mg | ORAL_TABLET | ORAL | 3 refills | Status: DC | PRN

## 2019-11-19 MED ORDER — LOSARTAN POTASSIUM 100 MG PO TABS
100.0000 mg | ORAL_TABLET | Freq: Every day | ORAL | 4 refills | Status: DC
Start: 2019-11-19 — End: 2021-02-02

## 2019-11-19 MED ORDER — ATORVASTATIN CALCIUM 40 MG PO TABS
40.0000 mg | ORAL_TABLET | Freq: Every day | ORAL | 4 refills | Status: DC
Start: 2019-11-19 — End: 2019-11-22

## 2019-11-19 NOTE — Assessment & Plan Note (Signed)
Acute and improving at this time.  Plan to repeat CXR next visit.  Labs today CBC, BMP, BNP.  Return in 4 weeks for follow-up.

## 2019-11-19 NOTE — Assessment & Plan Note (Addendum)
Ongoing with BP at goal today.  Recommend she monitor BP at home regularly and document.  Will continue Losartan 100 MG daily and add on Lasix 20 MG to take as needed for edema.  BMP, TSH, and BNP today. Would avoid Amlodipine due to baseline edema to BLE (suspect some lymphedema due to weight). Focus on DASH diet at home.  Return in 4 weeks.  Recommend: - Reminded to call for an overnight weight gain of >2 pounds or a weekly weight weight of >5 pounds - not adding salt to his food and has been reading food labels. Reviewed the importance of keeping daily sodium intake to 2000mg  daily

## 2019-11-19 NOTE — Progress Notes (Signed)
BP 121/79 (BP Location: Left Arm, Patient Position: Sitting, Cuff Size: Large)   Pulse 62   Temp (!) 97.5 F (36.4 C) (Oral)   Resp 16   Ht 5\' 3"  (1.6 m)   Wt 290 lb 9.6 oz (131.8 kg)   LMP  (LMP Unknown)   SpO2 97%   BMI 51.48 kg/m    Subjective:    Patient ID: Jasmine Webb, female    DOB: [birth year], 64 y.o.   MRN: [id number]  HPI: Jasmine Webb is a 64 y.o. female  Chief Complaint  Patient presents with  . Hospitalization Follow-up  . Hyperlipidemia  . Hypertension   Transition of Care Hospital Follow up.  Was admitted to hospital on 10/10/2019 for Covid 19 infection with acute respiratory failure and bilateral pulmonary emboli.  Had pulmonary Angiogram, catheter directed thrombolysis and thrombectomy. Echo on 10/10/19 noted EF 55-60% and Grade II diastolic dysfunction.  She reports at hospital they told her she would need compression hose due to her risk for blood clots.  She feels she is holding "water weight" recently.  She has follow-up with vascular next Friday, October 29th.  She continues on Eliquis for the PE.    "History of present illness:  [name] a7 y.o.Caucasian femalewith a known history ofhypertension and osteoarthritis, who presented to the emergency room with acute onset ofworsening dyspnea with associated cough with inability to expectorate without wheezing. She has been having fever and chills and admitted to nausea aswell as diarrhea for couple of days. She admitted to chest pain with cough. Her sense of taste has been different. She stated that it has been better with slightly diminished smell sensation. She has been having diminished appetite and has been taking Pedialyte. She denies any body aches.No dysuria, oliguria or hematuria or flank pain. She has not been vaccinated against COVID-19. She stated that she has been busy taking care of her 2 grandchildren.  Upon presentation to the emergency room, blood pressure was 142/95with a  heart rate of 108 respiratory rate of 22. Labs revealed a BNP of 66.1 and high-sensitivity troponin I was 657 and later 557. PT was 35 with INR 1 PT 13.1 COVID-19 PCR came back positive. Two-view chest x-ray showed cardiomegaly with no acute cardiopulmonary disease.  Chest CT angiogram revealed bilateral multiple PE with evidence of right ventricular strain consistent with at least submassive PE. It also showed nodular airspace infiltrates in the lung bases likely indicating COVID-19 pneumonia.  The patient was given 1 L bolus of IV lactated Ringer followed by 125 mL/h, and 25 mg IV Solu-Medrol, IV remdesivir and IV heparin bolus followed by drip. Contact was made with Dr. Lucky Cowboy with vascular surgery and he will attempt intra-arterial thrombolysis later today. She will be admitted to a progressive unit bed for further evaluation and management.  Hospital Course:  Pt admitted with chest pain and sob,found to have covid-19 pna and BL extensive submassive PE with right heart strain. Vascular sx consulted and pt is s/p cath directed thrombolysis and thrombectomy. Cardiology consulted as well. We appreciate consultant management and care. 9/11 Pt seen today she is alert and awake and oriented and is very grateful to be feeling better.  She is on 2 L  and is doing well. Expect d/c tomorrow.  9/12 Pt seen today and she has recovered well and is stable, is crying when d/w her about being discharged home today and no need for home oxygen. She was so very hapyp that she is doing  well and has reconvered well. She understand about anticoagulation plan and f/uw with pcp, vascular and cardiology . Pt's ambulating pulse oximetry testing was good pt can be d/c home today with home o2."  Hospital/Facility: [facility] D/C Physician: Dr. Posey Pronto D/C Date: 10/13/2019  Records Requested: 11/19/2019 Records Received: 11/19/2019 Records Reviewed: 11/19/2019  Diagnoses on Discharge: Acute hypoxemic respiratory failure  due to COVID  Date of interactive Contact within 48 hours of discharge:  Contact was through: phone  Date of 7 day or 14 day face-to-face visit:  within 30 days  Outpatient Encounter Medications as of 11/19/2019  Medication Sig  . acetaminophen (TYLENOL) 500 MG tablet Take 500 mg by mouth every 6 (six) hours as needed.  Marland Kitchen apixaban (ELIQUIS) 5 MG TABS tablet Take 1 tablet (5 mg total) by mouth 2 (two) times daily.  . Ascorbic Acid (VITAMIN C) 500 MG CAPS Take by mouth.  Marland Kitchen atorvastatin (LIPITOR) 40 MG tablet Take 1 tablet (40 mg total) by mouth daily.  . fluticasone (FLONASE) 50 MCG/ACT nasal spray Place 2 sprays into both nostrils daily.  Marland Kitchen losartan (COZAAR) 100 MG tablet Take 1 tablet (100 mg total) by mouth daily.  Marland Kitchen VITAMIN D PO Take 1,000 Units by mouth daily.   . [DISCONTINUED] amLODipine (NORVASC) 5 MG tablet Take 1 tablet (5 mg total) by mouth 2 (two) times daily as needed (sbp over 160).  . [DISCONTINUED] atorvastatin (LIPITOR) 40 MG tablet Take 1 tablet (40 mg total) by mouth daily.  . [DISCONTINUED] losartan (COZAAR) 100 MG tablet Take 100 mg by mouth daily.  . fexofenadine (ALLEGRA ALLERGY) 180 MG tablet Take 1 tablet (180 mg total) by mouth daily. (Patient not taking: Reported on 11/19/2019)  . furosemide (LASIX) 20 MG tablet Take 1 tablet (20 mg total) by mouth as needed for edema. Take once daily as needed for edema.  . [DISCONTINUED] apixaban (ELIQUIS) 5 MG TABS tablet Take 2 tablets (10 mg total) by mouth 2 (two) times daily for 7 days.   No facility-administered encounter medications on file as of 11/19/2019.    Diagnostic Tests Reviewed/Disposition: as noted below and under imaging in EPIC  Consults: cardiology and vascular  Discharge Instructions: Follow-up with PCP  Disease/illness Education: reviewed with patient  Home Health/Community Services Discussions/Referrals: reviewed with patient  Establishment or re-establishment of referral orders for community  resources: none  Discussion with other health care providers: reviewed all notes  Assessment and Support of treatment regimen adherence: reviewed with patient  Appointments Coordinated with: reviewed with patient  Education for self-management, independent living, and ADLs: reviewed with patient  HYPERTENSION / [place] Satisfied with current treatment? yes Duration of hypertension: chronic BP monitoring frequency: not checking BP range:  BP medication side effects: no Duration of hyperlipidemia: chronic Cholesterol medication side effects: no Cholesterol supplements: none Medication compliance: good compliance Aspirin: no Recent stressors: no Recurrent headaches: no Visual changes: no Palpitations: no Dyspnea: no Chest pain: no Lower extremity edema: baseline Dizzy/lightheaded: no  Relevant past medical, surgical, family and social history reviewed and updated as indicated. Interim medical history since our last visit reviewed. Allergies and medications reviewed and updated.  Review of Systems  Constitutional: Negative for activity change, appetite change, diaphoresis, fatigue and fever.  Respiratory: Negative for cough, chest tightness, shortness of breath and wheezing.   Cardiovascular: Positive for leg swelling. Negative for chest pain and palpitations.  Gastrointestinal: Negative.   Neurological: Negative.   Psychiatric/Behavioral: Negative.     Per HPI unless specifically indicated above  Objective:    BP 121/79 (BP Location: Left Arm, Patient Position: Sitting, Cuff Size: Large)   Pulse 62   Temp (!) 97.5 F (36.4 C) (Oral)   Resp 16   Ht 5\' 3"  (1.6 m)   Wt 290 lb 9.6 oz (131.8 kg)   LMP  (LMP Unknown)   SpO2 97%   BMI 51.48 kg/m   Wt Readings from Last 3 Encounters:  11/19/19 290 lb 9.6 oz (131.8 kg)  10/13/19 (!) 304 lb 14.3 oz (138.3 kg)  08/16/19 (!) 304 lb (137.9 kg)    Physical Exam Vitals and nursing note reviewed.    Constitutional:      General: She is awake. She is not in acute distress.    Appearance: She is well-developed and well-groomed. She is morbidly obese. She is not ill-appearing.  HENT:     Head: Normocephalic.     Right Ear: Hearing normal.     Left Ear: Hearing normal.  Eyes:     General: Lids are normal.        Right eye: No discharge.        Left eye: No discharge.     Conjunctiva/sclera: Conjunctivae normal.     Pupils: Pupils are equal, round, and reactive to light.  Neck:     Vascular: No carotid bruit.  Cardiovascular:     Rate and Rhythm: Normal rate and regular rhythm.     Pulses:          Dorsalis pedis pulses are 1+ on the right side and 1+ on the left side.       Posterior tibial pulses are 1+ on the right side and 1+ on the left side.     Heart sounds: Normal heart sounds. No murmur heard.  No gallop.      Comments: Baseline edema BLE Pulmonary:     Effort: Pulmonary effort is normal. No accessory muscle usage or respiratory distress.     Breath sounds: Normal breath sounds.  Abdominal:     General: Bowel sounds are normal.     Palpations: Abdomen is soft.  Musculoskeletal:     Cervical back: Normal range of motion and neck supple.     Right lower leg: 2+ Edema present.     Left lower leg: 2+ Edema present.  Skin:    General: Skin is warm and dry.  Neurological:     Mental Status: She is alert and oriented to person, place, and time.  Psychiatric:        Attention and Perception: Attention normal.        Mood and Affect: Mood normal.        Speech: Speech normal.        Behavior: Behavior normal. Behavior is cooperative.        Thought Content: Thought content normal.     Results for orders placed or performed during the hospital encounter of 10/10/19  SARS Coronavirus 2 by RT PCR (hospital order, performed in [facility] hospital lab) Nasopharyngeal Nasopharyngeal Swab   Specimen: Nasopharyngeal Swab  Result Value Ref Range   SARS Coronavirus 2  POSITIVE (A) NEGATIVE  Basic metabolic panel  Result Value Ref Range   Sodium 140 135 - 145 mmol/L   Potassium 3.8 3.5 - 5.1 mmol/L   Chloride 104 98 - 111 mmol/L   CO2 25 22 - 32 mmol/L   Glucose, Bld 125 (H) 70 - 99 mg/dL   BUN 15 8 - 23 mg/dL  Creatinine, Ser 0.69 0.44 - 1.00 mg/dL   Calcium 8.3 (L) 8.9 - 10.3 mg/dL   GFR calc non Af Amer >60 >60 mL/min   GFR calc Af Amer >60 >60 mL/min   Anion gap 11 5 - 15  CBC  Result Value Ref Range   WBC 5.1 4.0 - 10.5 K/uL   RBC 4.57 3.87 - 5.11 MIL/uL   Hemoglobin 13.1 12.0 - 15.0 g/dL   HCT 39.5 36 - 46 %   MCV 86.4 80.0 - 100.0 fL   MCH 28.7 26.0 - 34.0 pg   MCHC 33.2 30.0 - 36.0 g/dL   RDW 13.2 11.5 - 15.5 %   Platelets 132 (L) 150 - 400 K/uL   nRBC 0.0 0.0 - 0.2 %  APTT  Result Value Ref Range   aPTT 35 24 - 36 seconds  Protime-INR  Result Value Ref Range   Prothrombin Time 13.1 11.4 - 15.2 seconds   INR 1.0 0.8 - 1.2  Heparin level (unfractionated)  Result Value Ref Range   Heparin Unfractionated 0.54 0.30 - 0.70 IU/mL  Brain natriuretic peptide  Result Value Ref Range   B Natriuretic Peptide 66.1 0.0 - 100.0 pg/mL  HIV Antibody (routine testing w rflx)  Result Value Ref Range   HIV Screen 4th Generation wRfx Non Reactive Non Reactive  C-reactive protein  Result Value Ref Range   CRP 2.1 (H) <1.0 mg/dL  Fibrin derivatives D-Dimer ([facility] only)  Result Value Ref Range   Fibrin derivatives D-dimer ([facility]) >7,500.00 (H) 0.00 - 499.00 ng/mL (FEU)  Ferritin  Result Value Ref Range   Ferritin 251 11 - 307 ng/mL  Fibrinogen  Result Value Ref Range   Fibrinogen 510 (H) 210 - 475 mg/dL  Lactate dehydrogenase  Result Value Ref Range   LDH 215 (H) 98 - 192 U/L  Procalcitonin  Result Value Ref Range   Procalcitonin <0.10 ng/mL  Lipid panel  Result Value Ref Range   Cholesterol 139 0 - 200 mg/dL   Triglycerides 71 <150 mg/dL   HDL 36 (L) >40 mg/dL   Total CHOL/HDL Ratio 3.9 RATIO   VLDL 14 0 - 40 mg/dL   LDL  Cholesterol 89 0 - 99 mg/dL  Heparin level (unfractionated)  Result Value Ref Range   Heparin Unfractionated 0.66 0.30 - 0.70 IU/mL  CBC with Differential/Platelet  Result Value Ref Range   WBC 6.2 4.0 - 10.5 K/uL   RBC 4.08 3.87 - 5.11 MIL/uL   Hemoglobin 11.8 (L) 12.0 - 15.0 g/dL   HCT 34.3 (L) 36 - 46 %   MCV 84.1 80.0 - 100.0 fL   MCH 28.9 26.0 - 34.0 pg   MCHC 34.4 30.0 - 36.0 g/dL   RDW 13.3 11.5 - 15.5 %   Platelets 137 (L) 150 - 400 K/uL   nRBC 0.0 0.0 - 0.2 %   Neutrophils Relative % 61 %   Neutro Abs 3.8 1.7 - 7.7 K/uL   Lymphocytes Relative 27 %   Lymphs Abs 1.7 0.7 - 4.0 K/uL   Monocytes Relative 11 %   Monocytes Absolute 0.7 0.1 - 1.0 K/uL   Eosinophils Relative 0 %   Eosinophils Absolute 0.0 0.0 - 0.5 K/uL   Basophils Relative 0 %   Basophils Absolute 0.0 0.0 - 0.1 K/uL   Immature Granulocytes 1 %   Abs Immature Granulocytes 0.06 0.00 - 0.07 K/uL  Comprehensive metabolic panel  Result Value Ref Range   Sodium 142 135 - 145  mmol/L   Potassium 3.5 3.5 - 5.1 mmol/L   Chloride 107 98 - 111 mmol/L   CO2 27 22 - 32 mmol/L   Glucose, Bld 127 (H) 70 - 99 mg/dL   BUN 16 8 - 23 mg/dL   Creatinine, Ser 0.59 0.44 - 1.00 mg/dL   Calcium 8.0 (L) 8.9 - 10.3 mg/dL   Total Protein 5.7 (L) 6.5 - 8.1 g/dL   Albumin 3.0 (L) 3.5 - 5.0 g/dL   AST 36 15 - 41 U/L   ALT 26 0 - 44 U/L   Alkaline Phosphatase 50 38 - 126 U/L   Total Bilirubin 0.9 0.3 - 1.2 mg/dL   GFR calc non Af Amer >60 >60 mL/min   GFR calc Af Amer >60 >60 mL/min   Anion gap 8 5 - 15  C-reactive protein  Result Value Ref Range   CRP 1.4 (H) <1.0 mg/dL  Fibrin derivatives D-Dimer ([facility] only)  Result Value Ref Range   Fibrin derivatives D-dimer ([facility]) >7,500.00 (H) 0.00 - 499.00 ng/mL (FEU)  Magnesium  Result Value Ref Range   Magnesium 1.7 1.7 - 2.4 mg/dL  Ferritin  Result Value Ref Range   Ferritin 256 11 - 307 ng/mL  Lactate dehydrogenase  Result Value Ref Range   LDH 178 98 - 192 U/L  Heparin  level (unfractionated)  Result Value Ref Range   Heparin Unfractionated 0.26 (L) 0.30 - 0.70 IU/mL  CBC with Differential/Platelet  Result Value Ref Range   WBC 7.1 4.0 - 10.5 K/uL   RBC 4.11 3.87 - 5.11 MIL/uL   Hemoglobin 11.5 (L) 12.0 - 15.0 g/dL   HCT 35.4 (L) 36 - 46 %   MCV 86.1 80.0 - 100.0 fL   MCH 28.0 26.0 - 34.0 pg   MCHC 32.5 30.0 - 36.0 g/dL   RDW 13.0 11.5 - 15.5 %   Platelets 153 150 - 400 K/uL   nRBC 0.0 0.0 - 0.2 %   Neutrophils Relative % 84 %   Neutro Abs 5.9 1.7 - 7.7 K/uL   Lymphocytes Relative 11 %   Lymphs Abs 0.8 0.7 - 4.0 K/uL   Monocytes Relative 4 %   Monocytes Absolute 0.3 0.1 - 1.0 K/uL   Eosinophils Relative 0 %   Eosinophils Absolute 0.0 0.0 - 0.5 K/uL   Basophils Relative 0 %   Basophils Absolute 0.0 0.0 - 0.1 K/uL   Immature Granulocytes 1 %   Abs Immature Granulocytes 0.06 0.00 - 0.07 K/uL  Comprehensive metabolic panel  Result Value Ref Range   Sodium 140 135 - 145 mmol/L   Potassium 3.5 3.5 - 5.1 mmol/L   Chloride 105 98 - 111 mmol/L   CO2 27 22 - 32 mmol/L   Glucose, Bld 156 (H) 70 - 99 mg/dL   BUN 20 8 - 23 mg/dL   Creatinine, Ser 0.53 0.44 - 1.00 mg/dL   Calcium 8.4 (L) 8.9 - 10.3 mg/dL   Total Protein 6.1 (L) 6.5 - 8.1 g/dL   Albumin 3.3 (L) 3.5 - 5.0 g/dL   AST 26 15 - 41 U/L   ALT 26 0 - 44 U/L   Alkaline Phosphatase 55 38 - 126 U/L   Total Bilirubin 0.6 0.3 - 1.2 mg/dL   GFR calc non Af Amer >60 >60 mL/min   GFR calc Af Amer >60 >60 mL/min   Anion gap 8 5 - 15  C-reactive protein  Result Value Ref Range   CRP 0.8 <1.0 mg/dL  Fibrin derivatives D-Dimer ([facility] only)  Result Value Ref Range   Fibrin derivatives D-dimer ([facility]) 5,729.56 (H) 0.00 - 499.00 ng/mL (FEU)  Magnesium  Result Value Ref Range   Magnesium 1.6 (L) 1.7 - 2.4 mg/dL  Ferritin  Result Value Ref Range   Ferritin 204 11 - 307 ng/mL  Lactate dehydrogenase  Result Value Ref Range   LDH 188 98 - 192 U/L  CBC with Differential/Platelet  Result Value Ref  Range   WBC 12.1 (H) 4.0 - 10.5 K/uL   RBC 4.56 3.87 - 5.11 MIL/uL   Hemoglobin 12.7 12.0 - 15.0 g/dL   HCT 38.7 36 - 46 %   MCV 84.9 80.0 - 100.0 fL   MCH 27.9 26.0 - 34.0 pg   MCHC 32.8 30.0 - 36.0 g/dL   RDW 13.0 11.5 - 15.5 %   Platelets 199 150 - 400 K/uL   nRBC 0.0 0.0 - 0.2 %   Neutrophils Relative % 84 %   Neutro Abs 10.2 (H) 1.7 - 7.7 K/uL   Lymphocytes Relative 10 %   Lymphs Abs 1.2 0.7 - 4.0 K/uL   Monocytes Relative 4 %   Monocytes Absolute 0.4 0.1 - 1.0 K/uL   Eosinophils Relative 0 %   Eosinophils Absolute 0.0 0.0 - 0.5 K/uL   Basophils Relative 0 %   Basophils Absolute 0.0 0.0 - 0.1 K/uL   Immature Granulocytes 2 %   Abs Immature Granulocytes 0.28 (H) 0.00 - 0.07 K/uL  Comprehensive metabolic panel  Result Value Ref Range   Sodium 139 135 - 145 mmol/L   Potassium 3.8 3.5 - 5.1 mmol/L   Chloride 103 98 - 111 mmol/L   CO2 27 22 - 32 mmol/L   Glucose, Bld 130 (H) 70 - 99 mg/dL   BUN 23 8 - 23 mg/dL   Creatinine, Ser 0.66 0.44 - 1.00 mg/dL   Calcium 8.4 (L) 8.9 - 10.3 mg/dL   Total Protein 6.5 6.5 - 8.1 g/dL   Albumin 3.5 3.5 - 5.0 g/dL   AST 21 15 - 41 U/L   ALT 23 0 - 44 U/L   Alkaline Phosphatase 57 38 - 126 U/L   Total Bilirubin 0.8 0.3 - 1.2 mg/dL   GFR calc non Af Amer >60 >60 mL/min   GFR calc Af Amer >60 >60 mL/min   Anion gap 9 5 - 15  Fibrin derivatives D-Dimer ([facility] only)  Result Value Ref Range   Fibrin derivatives D-dimer ([facility]) 4,971.08 (H) 0.00 - 499.00 ng/mL (FEU)  C-reactive protein  Result Value Ref Range   CRP 0.7 <1.0 mg/dL  Ferritin  Result Value Ref Range   Ferritin 220 11 - 307 ng/mL  Lactate dehydrogenase  Result Value Ref Range   LDH 229 (H) 98 - 192 U/L  Magnesium  Result Value Ref Range   Magnesium 1.9 1.7 - 2.4 mg/dL  ECHOCARDIOGRAM COMPLETE  Result Value Ref Range   Weight 4,761.94 oz   BP 155/86 mmHg   Ao pk vel 1.70 m/s   AV Area VTI 3.35 cm2   AR max vel 2.48 cm2   AV Mean grad 7.3 mmHg   AV Peak grad 11.6  mmHg   S' Lateral 2.69 cm   AV Area mean vel 2.52 cm2   Area-P 1/2 3.77 cm2  Troponin I (High Sensitivity)  Result Value Ref Range   Troponin I (High Sensitivity) 657 (HH) <18 ng/L  Troponin I (High Sensitivity)  Result Value Ref Range  Troponin I (High Sensitivity) 557 (HH) <18 ng/L  Troponin I (High Sensitivity)  Result Value Ref Range   Troponin I (High Sensitivity) 423 (HH) <18 ng/L  Troponin I (High Sensitivity)  Result Value Ref Range   Troponin I (High Sensitivity) 334 (HH) <18 ng/L  Troponin I (High Sensitivity)  Result Value Ref Range   Troponin I (High Sensitivity) 162 (HH) <18 ng/L  Troponin I (High Sensitivity)  Result Value Ref Range   Troponin I (High Sensitivity) 81 (H) <18 ng/L  Troponin I (High Sensitivity)  Result Value Ref Range   Troponin I (High Sensitivity) 37 (H) <18 ng/L      Assessment & Plan:   Problem List Items Addressed This Visit      Cardiovascular and Mediastinum   Essential hypertension    Ongoing with BP at goal today.  Recommend she monitor BP at home regularly and document.  Will continue Losartan 100 MG daily and add on Lasix 20 MG to take as needed for edema.  BMP, TSH, and BNP today. Would avoid Amlodipine due to baseline edema to BLE (suspect some lymphedema due to weight). Focus on DASH diet at home.  Return in 4 weeks.  Recommend: - Reminded to call for an overnight weight gain of >2 pounds or a weekly weight weight of >5 pounds - not adding salt to his food and has been reading food labels. Reviewed the importance of keeping daily sodium intake to 2000mg  daily       Relevant Medications   atorvastatin (LIPITOR) 40 MG tablet   losartan (COZAAR) 100 MG tablet   furosemide (LASIX) 20 MG tablet   Other Relevant Orders   Basic metabolic panel   TSH   B Nat Peptide   Bilateral pulmonary embolism (HCC)    New diagnosis with Covid in September.  She is scheduled for follow-up with vascular next week, maintain this collaboration.   Continue current Eliquis dosing and review vascular note once available.  Recommend use of compression hose at home, on during day and off at night, will send script for this to local medical supply.  CBC and BMP today.  Return in 4 weeks.      Relevant Medications   atorvastatin (LIPITOR) 40 MG tablet   losartan (COZAAR) 100 MG tablet   furosemide (LASIX) 20 MG tablet   Other Relevant Orders   CBC with Differential/Platelet     Respiratory   Acute hypoxemic respiratory failure due to COVID-19 [facility]) - Primary    Acute and improving at this time.  Plan to repeat CXR next visit.  Labs today CBC, BMP, BNP.  Return in 4 weeks for follow-up.        Other   BMI 50.0-59.9, adult (HCC)    Recommended eating smaller high protein, low fat meals more frequently and exercising 30 mins a day 5 times a week with a goal of 10-15lb weight loss in the next 3 months. Patient voiced their understanding and motivation to adhere to these recommendations.       Elevated LDL cholesterol level    Chronic, ongoing.  Started on Atorvastatin in hospital, continue this regimen for prevention.  Refills sent.  Lipid panel today.  Return in 4 weeks.      Relevant Orders   Lipid Panel With LDL/HDL Ratio   Morbid obesity due to excess calories (HCC)    BMI 51.48, has lost over 10 pounds since September.  Recommended eating smaller high protein, low fat meals more  frequently and exercising 30 mins a day 5 times a week with a goal of 10-15lb weight loss in the next 3 months. Patient voiced their understanding and motivation to adhere to these recommendations.       Bilateral lower extremity edema    Recent imaging in hospital noted no DVT.  Suspect some underlying lymphedema due to obesity.  Will send in order to medical supply for compression hose to wear daily, on during day and off at night. Continue collaboration with vascular, may need compression pumps.          Follow up plan: Return in about 4 weeks  (around 12/17/2019) for HTN/HLD & PE.

## 2019-11-19 NOTE — Assessment & Plan Note (Signed)
Recommended eating smaller high protein, low fat meals more frequently and exercising 30 mins a day 5 times a week with a goal of 10-15lb weight loss in the next 3 months. Patient voiced their understanding and motivation to adhere to these recommendations.  

## 2019-11-19 NOTE — Assessment & Plan Note (Signed)
BMI 51.48, has lost over 10 pounds since September.  Recommended eating smaller high protein, low fat meals more frequently and exercising 30 mins a day 5 times a week with a goal of 10-15lb weight loss in the next 3 months. Patient voiced their understanding and motivation to adhere to these recommendations.

## 2019-11-19 NOTE — Patient Instructions (Signed)
COVID-19 COVID-19 is a respiratory infection that is caused by a virus called severe acute respiratory syndrome coronavirus 2 (SARS-CoV-2). The disease is also known as coronavirus disease or novel coronavirus. In some people, the virus may not cause any symptoms. In others, it may cause a serious infection. The infection can get worse quickly and can lead to complications, such as:  Pneumonia, or infection of the lungs.  Acute respiratory distress syndrome or ARDS. This is a condition in which fluid build-up in the lungs prevents the lungs from filling with air and passing oxygen into the blood.  Acute respiratory failure. This is a condition in which there is not enough oxygen passing from the lungs to the body or when carbon dioxide is not passing from the lungs out of the body.  Sepsis or septic shock. This is a serious bodily reaction to an infection.  Blood clotting problems.  Secondary infections due to bacteria or fungus.  Organ failure. This is when your body's organs stop working. The virus that causes COVID-19 is contagious. This means that it can spread from person to person through droplets from coughs and sneezes (respiratory secretions). What are the causes? This illness is caused by a virus. You may catch the virus by:  Breathing in droplets from an infected person. Droplets can be spread by a person breathing, speaking, singing, coughing, or sneezing.  Touching something, like a table or a doorknob, that was exposed to the virus (contaminated) and then touching your mouth, nose, or eyes. What increases the risk? Risk for infection You are more likely to be infected with this virus if you:  Are within 6 feet (2 meters) of a person with COVID-19.  Provide care for or live with a person who is infected with COVID-19.  Spend time in crowded indoor spaces or live in shared housing. Risk for serious illness You are more likely to become seriously ill from the virus if you:   Are 50 years of age or older. The higher your age, the more you are at risk for serious illness.  Live in a nursing home or long-term care facility.  Have cancer.  Have a long-term (chronic) disease such as: ? Chronic lung disease, including chronic obstructive pulmonary disease or asthma. ? A long-term disease that lowers your body's ability to fight infection (immunocompromised). ? Heart disease, including heart failure, a condition in which the arteries that lead to the heart become narrow or blocked (coronary artery disease), a disease which makes the heart muscle thick, weak, or stiff (cardiomyopathy). ? Diabetes. ? Chronic kidney disease. ? Sickle cell disease, a condition in which red blood cells have an abnormal "sickle" shape. ? Liver disease.  Are obese. What are the signs or symptoms? Symptoms of this condition can range from mild to severe. Symptoms may appear any time from 2 to 14 days after being exposed to the virus. They include:  A fever or chills.  A cough.  Difficulty breathing.  Headaches, body aches, or muscle aches.  Runny or stuffy (congested) nose.  A sore throat.  New loss of taste or smell. Some people may also have stomach problems, such as nausea, vomiting, or diarrhea. Other people may not have any symptoms of COVID-19. How is this diagnosed? This condition may be diagnosed based on:  Your signs and symptoms, especially if: ? You live in an area with a COVID-19 outbreak. ? You recently traveled to or from an area where the virus is common. ? You   provide care for or live with a person who was diagnosed with COVID-19. ? You were exposed to a person who was diagnosed with COVID-19.  A physical exam.  Lab tests, which may include: ? Taking a sample of fluid from the back of your nose and throat (nasopharyngeal fluid), your nose, or your throat using a swab. ? A sample of mucus from your lungs (sputum). ? Blood tests.  Imaging tests, which  may include, X-rays, CT scan, or ultrasound. How is this treated? At present, there is no medicine to treat COVID-19. Medicines that treat other diseases are being used on a trial basis to see if they are effective against COVID-19. Your health care provider will talk with you about ways to treat your symptoms. For most people, the infection is mild and can be managed at home with rest, fluids, and over-the-counter medicines. Treatment for a serious infection usually takes places in a hospital intensive care unit (ICU). It may include one or more of the following treatments. These treatments are given until your symptoms improve.  Receiving fluids and medicines through an IV.  Supplemental oxygen. Extra oxygen is given through a tube in the nose, a face mask, or a hood.  Positioning you to lie on your stomach (prone position). This makes it easier for oxygen to get into the lungs.  Continuous positive airway pressure (CPAP) or bi-level positive airway pressure (BPAP) machine. This treatment uses mild air pressure to keep the airways open. A tube that is connected to a motor delivers oxygen to the body.  Ventilator. This treatment moves air into and out of the lungs by using a tube that is placed in your windpipe.  Tracheostomy. This is a procedure to create a hole in the neck so that a breathing tube can be inserted.  Extracorporeal membrane oxygenation (ECMO). This procedure gives the lungs a chance to recover by taking over the functions of the heart and lungs. It supplies oxygen to the body and removes carbon dioxide. Follow these instructions at home: Lifestyle  If you are sick, stay home except to get medical care. Your health care provider will tell you how long to stay home. Call your health care provider before you go for medical care.  Rest at home as told by your health care provider.  Do not use any products that contain nicotine or tobacco, such as cigarettes, e-cigarettes, and  chewing tobacco. If you need help quitting, ask your health care provider.  Return to your normal activities as told by your health care provider. Ask your health care provider what activities are safe for you. General instructions  Take over-the-counter and prescription medicines only as told by your health care provider.  Drink enough fluid to keep your urine pale yellow.  Keep all follow-up visits as told by your health care provider. This is important. How is this prevented?  There is no vaccine to help prevent COVID-19 infection. However, there are steps you can take to protect yourself and others from this virus. To protect yourself:   Do not travel to areas where COVID-19 is a risk. The areas where COVID-19 is reported change often. To identify high-risk areas and travel restrictions, check the CDC travel website: wwwnc.cdc.gov/travel/notices  If you live in, or must travel to, an area where COVID-19 is a risk, take precautions to avoid infection. ? Stay away from people who are sick. ? Wash your hands often with soap and water for 20 seconds. If soap and water   are not available, use an alcohol-based hand sanitizer. ? Avoid touching your mouth, face, eyes, or nose. ? Avoid going out in public, follow guidance from your state and local health authorities. ? If you must go out in public, wear a cloth face covering or face mask. Make sure your mask covers your nose and mouth. ? Avoid crowded indoor spaces. Stay at least 6 feet (2 meters) away from others. ? Disinfect objects and surfaces that are frequently touched every day. This may include:  Counters and tables.  Doorknobs and light switches.  Sinks and faucets.  Electronics, such as phones, remote controls, keyboards, computers, and tablets. To protect others: If you have symptoms of COVID-19, take steps to prevent the virus from spreading to others.  If you think you have a COVID-19 infection, contact your health care  provider right away. Tell your health care team that you think you may have a COVID-19 infection.  Stay home. Leave your house only to seek medical care. Do not use public transport.  Do not travel while you are sick.  Wash your hands often with soap and water for 20 seconds. If soap and water are not available, use alcohol-based hand sanitizer.  Stay away from other members of your household. Let healthy household members care for children and pets, if possible. If you have to care for children or pets, wash your hands often and wear a mask. If possible, stay in your own room, separate from others. Use a different bathroom.  Make sure that all people in your household wash their hands well and often.  Cough or sneeze into a tissue or your sleeve or elbow. Do not cough or sneeze into your hand or into the air.  Wear a cloth face covering or face mask. Make sure your mask covers your nose and mouth. Where to find more information  Centers for Disease Control and Prevention: www.cdc.gov/coronavirus/2019-ncov/index.html  World Health Organization: www.who.int/health-topics/coronavirus Contact a health care provider if:  You live in or have traveled to an area where COVID-19 is a risk and you have symptoms of the infection.  You have had contact with someone who has COVID-19 and you have symptoms of the infection. Get help right away if:  You have trouble breathing.  You have pain or pressure in your chest.  You have confusion.  You have bluish lips and fingernails.  You have difficulty waking from sleep.  You have symptoms that get worse. These symptoms may represent a serious problem that is an emergency. Do not wait to see if the symptoms will go away. Get medical help right away. Call your local emergency services (911 in the [place] Do not drive yourself to the hospital. Let the emergency medical personnel know if you think you have COVID-19. Summary  COVID-19 is a  respiratory infection that is caused by a virus. It is also known as coronavirus disease or novel coronavirus. It can cause serious infections, such as pneumonia, acute respiratory distress syndrome, acute respiratory failure, or sepsis.  The virus that causes COVID-19 is contagious. This means that it can spread from person to person through droplets from breathing, speaking, singing, coughing, or sneezing.  You are more likely to develop a serious illness if you are 50 years of age or older, have a weak immune system, live in a nursing home, or have chronic disease.  There is no medicine to treat COVID-19. Your health care provider will talk with you about ways to treat your symptoms.    Take steps to protect yourself and others from infection. Wash your hands often and disinfect objects and surfaces that are frequently touched every day. Stay away from people who are sick and wear a mask if you are sick. This information is not intended to replace advice given to you by your health care provider. Make sure you discuss any questions you have with your health care provider. Document Revised: 11/16/2018 Document Reviewed: 02/22/2018 Elsevier Patient Education  2020 [employer]vier Inc.  

## 2019-11-19 NOTE — Assessment & Plan Note (Signed)
Chronic, ongoing.  Started on Atorvastatin in hospital, continue this regimen for prevention.  Refills sent.  Lipid panel today.  Return in 4 weeks.

## 2019-11-19 NOTE — Assessment & Plan Note (Signed)
New diagnosis with Covid in September.  She is scheduled for follow-up with vascular next week, maintain this collaboration.  Continue current Eliquis dosing and review vascular note once available.  Recommend use of compression hose at home, on during day and off at night, will send script for this to local medical supply.  CBC and BMP today.  Return in 4 weeks.

## 2019-11-19 NOTE — Assessment & Plan Note (Signed)
Recent imaging in hospital noted no DVT.  Suspect some underlying lymphedema due to obesity.  Will send in order to medical supply for compression hose to wear daily, on during day and off at night. Continue collaboration with vascular, may need compression pumps.

## 2019-11-20 LAB — CBC WITH DIFFERENTIAL/PLATELET
Basophils Absolute: 0 10*3/uL (ref 0.0–0.2)
Basos: 0 %
EOS (ABSOLUTE): 0.1 10*3/uL (ref 0.0–0.4)
Eos: 2 %
Hematocrit: 37.7 % (ref 34.0–46.6)
Hemoglobin: 12.5 g/dL (ref 11.1–15.9)
Immature Grans (Abs): 0 10*3/uL (ref 0.0–0.1)
Immature Granulocytes: 0 %
Lymphocytes Absolute: 1.5 10*3/uL (ref 0.7–3.1)
Lymphs: 21 %
MCH: 28.3 pg (ref 26.6–33.0)
MCHC: 33.2 g/dL (ref 31.5–35.7)
MCV: 86 fL (ref 79–97)
Monocytes Absolute: 0.5 10*3/uL (ref 0.1–0.9)
Monocytes: 7 %
Neutrophils Absolute: 5.1 10*3/uL (ref 1.4–7.0)
Neutrophils: 70 %
Platelets: 223 10*3/uL (ref 150–450)
RBC: 4.41 x10E6/uL (ref 3.77–5.28)
RDW: 13.3 % (ref 11.7–15.4)
WBC: 7.3 10*3/uL (ref 3.4–10.8)

## 2019-11-20 LAB — LIPID PANEL WITH LDL/HDL RATIO
Cholesterol, Total: 187 mg/dL (ref 100–199)
HDL: 47 mg/dL (ref >39)
LDL Chol Calc (NIH): 123 mg/dL — ABNORMAL HIGH (ref 0–99)
LDL/HDL Ratio: 2.6 ratio (ref 0.0–3.2)
Triglycerides: 93 mg/dL (ref 0–149)
VLDL Cholesterol Cal: 17 mg/dL (ref 5–40)

## 2019-11-20 LAB — BASIC METABOLIC PANEL
BUN/Creatinine Ratio: 20 (ref 12–28)
BUN: 15 mg/dL (ref 8–27)
CO2: 26 mmol/L (ref 20–29)
Calcium: 9.4 mg/dL (ref 8.7–10.3)
Chloride: 103 mmol/L (ref 96–106)
Creatinine, Ser: 0.75 mg/dL (ref 0.57–1.00)
GFR calc Af Amer: 97 mL/min/{1.73_m2} (ref >59)
GFR calc non Af Amer: 84 mL/min/{1.73_m2} (ref >59)
Glucose: 92 mg/dL (ref 65–99)
Potassium: 3.7 mmol/L (ref 3.5–5.2)
Sodium: 141 mmol/L (ref 134–144)

## 2019-11-20 LAB — BRAIN NATRIURETIC PEPTIDE: BNP: 25.3 pg/mL (ref 0.0–100.0)

## 2019-11-20 LAB — TSH: TSH: 2.23 u[IU]/mL (ref 0.450–4.500)

## 2019-11-20 NOTE — Progress Notes (Signed)
Good afternoon, please let Marisa know her labs have returned.  CBC shows no anemia or infection.  Kidney function and electrolytes look fantastic.  Thyroid is normal and BNP shows no heart failure concerns.  Cholesterol levels do show mild elevation in LDL, bad cholesterol, were you fasting?  Are you taking Atorvastatin daily.  If you were fasting and are taking medication daily then it may be good to increase this to 80 MG, max dose to get LDL at goal.  Let me know if this is something you want to try.  Any questions?  Have a great day!! Keep being awesome!!  Thank you for allowing me to participate in your care. Kindest regards, Nazareth Norenberg

## 2019-11-21 ENCOUNTER — Telehealth: Payer: Self-pay

## 2019-11-21 NOTE — Telephone Encounter (Signed)
RX written and placed in providers folder for signature. Will fax to [facility] once signed.

## 2019-11-21 NOTE — Telephone Encounter (Signed)
-----   Message from Venita Lick, NP sent at 11/19/2019  5:11 PM EDT ----- Can you do me a favor and write a compression hose script for patient that we can send to local medical supply -- once faxed can you alert patient which medical supply it was sent to.  Thank you.  Suspect she will be large or x-L, but they will measure as well.

## 2019-11-22 ENCOUNTER — Telehealth: Payer: Self-pay

## 2019-11-22 ENCOUNTER — Other Ambulatory Visit: Payer: Self-pay | Admitting: Nurse Practitioner

## 2019-11-22 MED ORDER — ATORVASTATIN CALCIUM 40 MG PO TABS
40.0000 mg | ORAL_TABLET | Freq: Every day | ORAL | 4 refills | Status: DC
Start: 2019-11-22 — End: 2021-02-06

## 2019-11-22 NOTE — Telephone Encounter (Signed)
Sent to [facility] on [address].

## 2019-11-22 NOTE — Telephone Encounter (Signed)
Routing to provider  

## 2019-11-22 NOTE — Telephone Encounter (Signed)
Patient notified

## 2019-11-22 NOTE — Telephone Encounter (Signed)
Copied from [place] [id number]. Topic: General - Other >> Nov 22, 2019  8:09 AM Leward Quan A wrote: Reason for CRM: Patient called to inform Jolene that she is ok taking the Statin medication so asking that the Rx sent to her pharmacy please and she will pick it up today Ph#   [phone]

## 2019-11-26 NOTE — Telephone Encounter (Signed)
Order faxed to [facility] yesterday afternoon.

## 2019-11-27 ENCOUNTER — Other Ambulatory Visit: Payer: Self-pay | Admitting: Nurse Practitioner

## 2019-11-27 MED ORDER — APIXABAN 5 MG PO TABS: 5.0000 mg | ORAL_TABLET | Freq: Two times a day (BID) | ORAL | 3 refills | Status: DC

## 2019-11-27 NOTE — Telephone Encounter (Signed)
PT need a refill  apixaban (ELIQUIS) 5 MG TABS tablet [[id number]]  [id number] [place]  [address] [place] [address]  Phone: [phone] Fax: [phone]

## 2019-11-27 NOTE — Telephone Encounter (Signed)
Requested medication (s) are due for refill today:yes  Requested medication (s) are on the active medication list: yes  Last refill: 10/18/19  #60  0 refills  end date 11/19/19  Future visit scheduled: yes  Notes to clinic: Medication assigned end date    Requested Prescriptions  Pending Prescriptions Disp Refills   apixaban (ELIQUIS) 5 MG TABS tablet 60 tablet 0    Sig: Take 1 tablet (5 mg total) by mouth 2 (two) times daily.      Hematology:  Anticoagulants Passed - 11/27/2019 11:55 AM      Passed - HGB in normal range and within 360 days    Hemoglobin  Date Value Ref Range Status  11/19/2019 12.5 11.1 - 15.9 g/dL Final          Passed - PLT in normal range and within 360 days    Platelets  Date Value Ref Range Status  11/19/2019 223 150 - 450 x10E3/uL Final          Passed - HCT in normal range and within 360 days    Hematocrit  Date Value Ref Range Status  11/19/2019 37.7 34.0 - 46.6 % Final          Passed - Cr in normal range and within 360 days    Creatinine, Ser  Date Value Ref Range Status  11/19/2019 0.75 0.57 - 1.00 mg/dL Final          Passed - Valid encounter within last 12 months    Recent Outpatient Visits           1 week ago Acute hypoxemic respiratory failure due to COVID-19 [facility])   [place], Jolene T, NP   3 months ago Morbid obesity due to excess calories ([place])   [place], [place] T, NP   4 months ago BMI 50.0-59.9, adult ([place])   [place], Barbaraann Faster, NP   6 months ago Annual physical exam   [place], Barbaraann Faster, NP   7 months ago Encounter to establish care   [facility] [place], Barbaraann Faster, NP       Future Appointments             In 2 weeks Cannady, Barbaraann Faster, NP [facility]

## 2019-11-27 NOTE — Telephone Encounter (Signed)
Patient last seen 11/19/19 and has appointment 12/17/19.

## 2019-11-27 NOTE — Telephone Encounter (Signed)
Requested medication (s) are due for refill today: yes  Requested medication (s) are on the active medication list: yes  Last refill:  10/18/19  #60  0 refills  end date 11/19/19  Future visit scheduled: yes  Notes to clinic:  Medication has end date 11/19/19    Requested Prescriptions  Pending Prescriptions Disp Refills   apixaban (ELIQUIS) 5 MG TABS tablet 60 tablet 0    Sig: Take 1 tablet (5 mg total) by mouth 2 (two) times daily.      Hematology:  Anticoagulants Passed - 11/27/2019 12:43 PM      Passed - HGB in normal range and within 360 days    Hemoglobin  Date Value Ref Range Status  11/19/2019 12.5 11.1 - 15.9 g/dL Final          Passed - PLT in normal range and within 360 days    Platelets  Date Value Ref Range Status  11/19/2019 223 150 - 450 x10E3/uL Final          Passed - HCT in normal range and within 360 days    Hematocrit  Date Value Ref Range Status  11/19/2019 37.7 34.0 - 46.6 % Final          Passed - Cr in normal range and within 360 days    Creatinine, Ser  Date Value Ref Range Status  11/19/2019 0.75 0.57 - 1.00 mg/dL Final          Passed - Valid encounter within last 12 months    Recent Outpatient Visits           1 week ago Acute hypoxemic respiratory failure due to COVID-19 [facility])   [place], Jolene T, NP   3 months ago Morbid obesity due to excess calories ([place])   [place], [place] T, NP   4 months ago BMI 50.0-59.9, adult ([place])   [place], Barbaraann Faster, NP   6 months ago Annual physical exam   [place] Cann[place], Barbaraann Faster, NP   7 months ago Encounter to establish care   [facility] [place], Barbaraann Faster, NP       Future Appointments             In 2 weeks Cannady, Barbaraann Faster, NP [employer] [facility]

## 2019-11-27 NOTE — Telephone Encounter (Signed)
Not a patient at [facility]

## 2019-11-29 ENCOUNTER — Encounter (INDEPENDENT_AMBULATORY_CARE_PROVIDER_SITE_OTHER): Payer: Self-pay | Admitting: Vascular Surgery

## 2019-11-29 ENCOUNTER — Ambulatory Visit (INDEPENDENT_AMBULATORY_CARE_PROVIDER_SITE_OTHER): Payer: Medicare Other | Admitting: Vascular Surgery

## 2019-11-29 ENCOUNTER — Other Ambulatory Visit: Payer: Self-pay

## 2019-11-29 VITALS — BP 160/82 | HR 81 | Resp 16 | Wt 291.4 lb

## 2019-11-29 DIAGNOSIS — I2699 Other pulmonary embolism without acute cor pulmonale: Secondary | ICD-10-CM

## 2019-11-29 DIAGNOSIS — U071 COVID-19: Secondary | ICD-10-CM

## 2019-11-29 DIAGNOSIS — I10 Essential (primary) hypertension: Secondary | ICD-10-CM | POA: Diagnosis not present

## 2019-11-29 DIAGNOSIS — J9601 Acute respiratory failure with hypoxia: Secondary | ICD-10-CM

## 2019-11-29 NOTE — Assessment & Plan Note (Signed)
blood pressure control important in reducing the progression of atherosclerotic disease. On appropriate oral medications.  

## 2019-11-29 NOTE — Progress Notes (Signed)
MRN : [id number]  Jasmine Webb is a 64 y.o. (07/29/1955) female who presents with chief complaint of  Chief Complaint  Patient presents with   Follow-up    [facility] up  .  History of Present Illness: Patient returns today in follow up of her pulmonary embolus status post thrombectomy a little over a month ago.  She says within hours of her procedure, she can notice a marked difference in terms of her breathing.  She is at her normal baseline of breathing at this point.  She had no periprocedural complications.  Her access site is well-healed.  Current Outpatient Medications  Medication Sig Dispense Refill   acetaminophen (TYLENOL) 500 MG tablet Take 500 mg by mouth every 6 (six) hours as needed.     apixaban (ELIQUIS) 5 MG TABS tablet Take 1 tablet (5 mg total) by mouth 2 (two) times daily. 60 tablet 3   Ascorbic Acid (VITAMIN C) 500 MG CAPS Take by mouth.     atorvastatin (LIPITOR) 40 MG tablet Take 1 tablet (40 mg total) by mouth daily. 90 tablet 4   FIBER PO Take by mouth daily.     fluticasone (FLONASE) 50 MCG/ACT nasal spray Place 2 sprays into both nostrils daily.     furosemide (LASIX) 20 MG tablet Take 1 tablet (20 mg total) by mouth as needed for edema. Take once daily as needed for edema. 30 tablet 3   losartan (COZAAR) 100 MG tablet Take 1 tablet (100 mg total) by mouth daily. 90 tablet 4   VITAMIN D PO Take 1,000 Units by mouth daily.      fexofenadine (ALLEGRA ALLERGY) 180 MG tablet Take 1 tablet (180 mg total) by mouth daily. (Patient not taking: Reported on 11/19/2019) 30 tablet 1   No current facility-administered medications for this visit.    Past Medical History:  Diagnosis Date   Arthritis    Tumor    breast and pelvic    Past Surgical History:  Procedure Laterality Date   ABDOMINAL HYSTERECTOMY  2003   BREAST SURGERY     PULMONARY THROMBECTOMY N/A 10/10/2019   Procedure: PULMONARY THROMBECTOMY;  Surgeon: DAlgernon Huxley MD;   [facility];  Service: Cardiovascular;  Laterality: N/A;     Social History   Tobacco Use   Smoking status: Never Smoker   Smokeless tobacco: Never Used  Vaping Use   Vaping Use: Never used  Substance Use Topics   Alcohol use: Never   Drug use: Never      Family History  Problem Relation Age of Onset   COPD Mother    ALS Father    Heart disease Sister    Diabetes Brother    Hypertension Brother    Asthma Son    Breast cancer Sister    Colon cancer Sister    Sinusitis Son     Allergies  Allergen Reactions   Ibuprofen     Told to avoid ibuprofen related products by doctor.     REVIEW OF SYSTEMS (Negative unless checked)  Constitutional: _0 Weight loss  _1 Fever  _2 Chills Cardiac: _3 Chest pain   _4 Chest pressure   _5 Palpitations   _6 Shortness of breath when laying flat   _7 Shortness of breath at rest   _8 Shortness of breath with exertion. Vascular:  _9 Pain in legs with walking   _10 Pain in legs at rest   _11 Pain in legs when laying flat   _12 Claudication   _13 Pain in feet when walking  _14   Pain in feet at rest  _0 Pain in feet when laying flat   _1 History of DVT   _2 Phlebitis   _3 Swelling in legs   _4 Varicose veins   _5 Non-healing ulcers Pulmonary:   _6 Uses home oxygen   _7 Productive cough   _8 Hemoptysis   _9 Wheeze  _10 COPD   _11 Asthma Neurologic:  _12 Dizziness  _13 Blackouts   _14 Seizures   _15 History of stroke   _16 History of TIA  _17 Aphasia   _18 Temporary blindness   _19 Dysphagia   _20 Weakness or numbness in arms   _21 Weakness or numbness in legs Musculoskeletal:  _22 Arthritis   _23 Joint swelling   _24 Joint pain   _25 Low back pain Hematologic:  _26 Easy bruising  _27 Easy bleeding   _28 Hypercoagulable state   _29 Anemic   Gastrointestinal:  _30 Blood in stool   _31 Vomiting blood  _32 Gastroesophageal reflux/heartburn   _33 Abdominal pain Genitourinary:  _34 Chronic kidney disease   _35 Difficult urination  _36 Frequent urination  _37 Burning with urination   _38 Hematuria Skin:   _39 Rashes   _40 Ulcers   _41 Wounds Psychological:  _42 History of anxiety   _43  History of major depression.  Physical Examination  BP (!) 160/82 (BP Location: Right Arm)    Pulse 81    Resp 16    Wt 291 lb 6.4 oz (132.2 kg)    LMP  (LMP Unknown)    BMI 51.62 kg/m  Gen:  WD/WN, NAD Head: Halfway/AT, No temporalis wasting. Ear/Nose/Throat: Hearing grossly intact, nares w/o erythema or drainage Eyes: Conjunctiva clear. Sclera non-icteric Neck: Supple.  Trachea midline Pulmonary:  Good air movement, no use of accessory muscles.  Cardiac: RRR, no JVD Vascular:  Vessel Right Left  Radial Palpable Palpable               Musculoskeletal: M/S 5/5 throughout.  No deformity or atrophy. Mild LE edema. Neurologic: Sensation grossly intact in extremities.  Symmetrical.  Speech is fluent.  Psychiatric: Judgment intact, Mood & affect appropriate for pt's clinical situation. Dermatologic: No rashes or ulcers noted.  No cellulitis or open wounds.       Labs Recent Results (from the past 2160 hour(s))  Basic metabolic panel     Status: Abnormal   Collection Time: 10/09/19 11:24 PM  Result Value Ref Range   Sodium 140 135 - 145 mmol/L   Potassium 3.8 3.5 - 5.1 mmol/L   Chloride 104 98 - 111 mmol/L   CO2 25 22 - 32 mmol/L   Glucose, Bld 125 (H) 70 - 99 mg/dL    Comment: Glucose reference range applies only to samples taken after fasting for at least 8 hours.   BUN 15 8 - 23 mg/dL   Creatinine, Ser 0.69 0.44 - 1.00 mg/dL   Calcium 8.3 (L) 8.9 - 10.3 mg/dL   GFR calc non Af Amer >60 >60 mL/min   GFR calc Af Amer >60 >60 mL/min   Anion gap 11 5 - 15    Comment: Performed at [facility], [address] [place], [place] [address]  CBC     Status: Abnormal   Collection Time: 10/09/19 11:24 PM  Result Value Ref Range   WBC 5.1 4.0 - 10.5 K/uL   RBC 4.57 3.87 - 5.11 MIL/uL   Hemoglobin 13.1 12.0 - 15.0 g/dL   HCT 39.5 36 - 46 %   MCV 86.4 80.0 - 100.0 fL   MCH 28.7 26.0 - 34.0 pg   MCHC  33.2 30.0 - 36.0 g/dL   RDW 13.2 11.5 - 15.5 %   Platelets 132 (L) 150 -  400 K/uL   nRBC 0.0 0.0 - 0.2 %    Comment: Performed at [facility], [place], [place] [address]  Troponin I (High Sensitivity)     Status: Abnormal   Collection Time: 10/09/19 11:24 PM  Result Value Ref Range   Troponin I (High Sensitivity) 657 (HH) <18 ng/L    Comment: CRITICAL RESULT CALLED TO, READ BACK BY AND VERIFIED WITH LISA THOMPSON _0  ON 10/10/19 SKL (NOTE) Elevated high sensitivity troponin I (hsTnI) values and significant  changes across serial measurements may suggest ACS but many other  chronic and acute conditions are known to elevate hsTnI results.  Refer to the "Links" section for chest pain algorithms and additional  guidance. Performed at [facility], [address] [place], [place] [address]   SARS Coronavirus 2 by RT PCR (hospital order, performed in [facility] hospital lab) Nasopharyngeal Nasopharyngeal Swab     Status: Abnormal   Collection Time: 10/09/19 11:24 PM   Specimen: Nasopharyngeal Swab  Result Value Ref Range   SARS Coronavirus 2 POSITIVE (A) NEGATIVE    Comment: RESULT CALLED TO, READ BACK BY AND VERIFIED WITH: [place] [phone] 10/10/19 HNM (NOTE) SARS-CoV-2 target nucleic acids are DETECTED  SARS-CoV-2 RNA is generally detectable in upper respiratory specimens  during the acute phase of infection.  Positive results are indicative  of the presence of the identified virus, but do not rule out bacterial infection or co-infection with other pathogens not detected by the test.  Clinical correlation with patient history and  other diagnostic information is necessary to determine patient infection status.  The expected result is negative.  Fact Sheet for Patients:   StrictlyIdeas.no   Fact Sheet for Healthcare Providers:   BankingDealers.co.za    This test is not yet approved or  cleared by the [place] FDA and  has been authorized for detection and/or diagnosis of SARS-CoV-2 by FDA under an Emergency Use Authorization (EUA).  This EUA will remain in effect (meaning this test  can be used) for the duration of  the COVID-19 declaration under Section 564(b)(1) of the Act, 21 U.S.C. section 360-bbb-3(b)(1), unless the authorization is terminated or revoked sooner.  Performed at [facility], [address] [place], [place] [address]   Brain natriuretic peptide     Status: None   Collection Time: 10/09/19 11:24 PM  Result Value Ref Range   B Natriuretic Peptide 66.1 0.0 - 100.0 pg/mL    Comment: Performed at [facility], [place], [place] [address]  Troponin I (High Sensitivity)     Status: Abnormal   Collection Time: 10/10/19  2:55 AM  Result Value Ref Range   Troponin I (High Sensitivity) 557 (HH) <18 ng/L    Comment: CRITICAL VALUE NOTED. VALUE IS CONSISTENT WITH PREVIOUSLY REPORTED/CALLED VALUE SKL (NOTE) Elevated high sensitivity troponin I (hsTnI) values and significant  changes across serial measurements may suggest ACS but many other  chronic and acute conditions are known to elevate hsTnI results.  Refer to the "Links" section for chest pain algorithms and additional  guidance. Performed at [facility], [address] [place], [place] [address]   APTT     Status: None   Collection Time: 10/10/19  3:01 AM  Result Value Ref Range   aPTT 35 24 - 36 seconds    Comment: Performed at [facility], [address], [place], [place] [address]  Protime-INR     Status:  None   Collection Time: 10/10/19  3:01 AM  Result Value Ref Range   Prothrombin Time 13.1 11.4 - 15.2 seconds   INR 1.0 0.8 - 1.2    Comment: (NOTE) INR goal varies based on device and disease states. Performed at [facility], [address] [place], [place] [address]   HIV Antibody (routine testing w rflx)      Status: None   Collection Time: 10/10/19  5:35 AM  Result Value Ref Range   HIV Screen 4th Generation wRfx Non Reactive Non Reactive    Comment: Performed at [facility], [place] [address] [place], [place] [address]  C-reactive protein     Status: Abnormal   Collection Time: 10/10/19  5:35 AM  Result Value Ref Range   CRP 2.1 (H) <1.0 mg/dL    Comment: Performed at [facility] [address] [place], [place] [address]  Fibrin derivatives D-Dimer [facility] only)     Status: Abnormal   Collection Time: 10/10/19  5:35 AM  Result Value Ref Range   Fibrin derivatives D-dimer ([facility]) >7,500.00 (H) 0.00 - 499.00 ng/mL (FEU)    Comment: (NOTE) <> Exclusion of Venous Thromboembolism (VTE) - OUTPATIENT ONLY   (Emergency Department or [place])    0-499 ng/ml (FEU): With a low to intermediate pretest probability                      for VTE this test result excludes the diagnosis                      of VTE.   >499 ng/ml (FEU) : VTE not excluded; additional work up for VTE is                      required.  <> Testing on Inpatients and Evaluation of Disseminated Intravascular   Coagulation (DIC) Reference Range:   0-499 ng/ml (FEU) Performed at [facility], [address] [place], [place] [address]   Ferritin     Status: None   Collection Time: 10/10/19  5:35 AM  Result Value Ref Range   Ferritin 251 11 - 307 ng/mL    Comment: Performed at [facility], [address] [place], [place] [address]  Fibrinogen     Status: Abnormal   Collection Time: 10/10/19  5:35 AM  Result Value Ref Range   Fibrinogen 510 (H) 210 - 475 mg/dL    Comment: Performed at [facility], [address] [place], [place] [address]  Lactate dehydrogenase     Status: Abnormal   Collection Time: 10/10/19  5:35 AM  Result Value Ref Range   LDH 215 (H) 98 - 192 U/L    Comment: Performed at [facility], [address] [place], [place] [address]  Procalcitonin      Status: None   Collection Time: 10/10/19  5:35 AM  Result Value Ref Range   Procalcitonin <0.10 ng/mL    Comment:        Interpretation: PCT (Procalcitonin) <= 0.5 ng/mL: Systemic infection (sepsis) is not likely. Local bacterial infection is possible. (NOTE)       Sepsis PCT Algorithm           Lower Respiratory Tract  Infection PCT Algorithm    ----------------------------     ----------------------------         PCT < 0.25 ng/mL                PCT < 0.10 ng/mL          Strongly encourage             Strongly discourage   discontinuation of antibiotics    initiation of antibiotics    ----------------------------     -----------------------------       PCT 0.25 - 0.50 ng/mL            PCT 0.10 - 0.25 ng/mL               OR       >80% decrease in PCT            Discourage initiation of                                            antibiotics      Encourage discontinuation           of antibiotics    ----------------------------     -----------------------------         PCT >= 0.50 ng/mL              PCT 0.26 - 0.50 ng/mL               AND        <80% decrease in PCT             Encourage initiation of                                             antibiotics       Encourage continuation           of antibiotics    ----------------------------     -----------------------------        PCT >= 0.50 ng/mL                  PCT > 0.50 ng/mL               AND         increase in PCT                  Strongly encourage                                      initiation of antibiotics    Strongly encourage escalation           of antibiotics                                     -----------------------------                                           PCT <= 0.25 ng/mL  OR                                        > 80% decrease in PCT                                      Discontinue / Do not initiate                                              antibiotics  Performed at [facility], [address] [place], [place] [address]   Troponin I (High Sensitivity)     Status: Abnormal   Collection Time: 10/10/19  5:35 AM  Result Value Ref Range   Troponin I (High Sensitivity) 423 (HH) <18 ng/L    Comment: CRITICAL VALUE NOTED. VALUE IS CONSISTENT WITH PREVIOUSLY REPORTED/CALLED VALUE RH (NOTE) Elevated high sensitivity troponin I (hsTnI) values and significant  changes across serial measurements may suggest ACS but many other  chronic and acute conditions are known to elevate hsTnI results.  Refer to the "Links" section for chest pain algorithms and additional  guidance. Performed at [facility], [place], [place] [address]   Heparin level (unfractionated)     Status: None   Collection Time: 10/10/19  8:51 AM  Result Value Ref Range   Heparin Unfractionated 0.54 0.30 - 0.70 IU/mL    Comment: (NOTE) If heparin results are below expected values, and patient dosage has  been confirmed, suggest follow up testing of antithrombin III levels. Performed at [facility], [address] [place], [place] [address]   Lipid panel     Status: Abnormal   Collection Time: 10/10/19  8:51 AM  Result Value Ref Range   Cholesterol 139 0 - 200 mg/dL   Triglycerides 71 <150 mg/dL   HDL 36 (L) >40 mg/dL   Total CHOL/HDL Ratio 3.9 RATIO   VLDL 14 0 - 40 mg/dL   LDL Cholesterol 89 0 - 99 mg/dL    Comment:        Total Cholesterol/HDL:CHD Risk Coronary Heart Disease Risk Table                     Men   Women  1/2 Average Risk   3.4   3.3  Average Risk       5.0   4.4  2 X Average Risk   9.6   7.1  3 X Average Risk  23.4   11.0        Use the calculated Patient Ratio above and the CHD Risk Table to determine the patient's CHD Risk.        ATP III CLASSIFICATION (LDL):  <100     mg/dL   Optimal  100-129  mg/dL   Near or Above                    Optimal   130-159  mg/dL   Borderline  160-189  mg/dL   High  >190     mg/dL   Very High Performed at [place], [place] [address]   Troponin I (High Sensitivity)  Status: Abnormal   Collection Time: 10/10/19  8:51 AM  Result Value Ref Range   Troponin I (High Sensitivity) 334 (HH) <18 ng/L    Comment: CRITICAL VALUE NOTED. VALUE IS CONSISTENT WITH PREVIOUSLY REPORTED/CALLED VALUE/HKP (NOTE) Elevated high sensitivity troponin I (hsTnI) values and significant  changes across serial measurements may suggest ACS but many other  chronic and acute conditions are known to elevate hsTnI results.  Refer to the "Links" section for chest pain algorithms and additional  guidance. Performed at [facility], [address] [place], [place] [address]   ECHOCARDIOGRAM COMPLETE     Status: None   Collection Time: 10/10/19  9:48 AM  Result Value Ref Range   Weight 4,761.94 oz   BP 155/86 mmHg   Ao pk vel 1.70 m/s   AV Area VTI 3.35 cm2   AR max vel 2.48 cm2   AV Mean grad 7.3 mmHg   AV Peak grad 11.6 mmHg   S' Lateral 2.69 cm   AV Area mean vel 2.52 cm2   Area-P 1/2 3.77 cm2  Heparin level (unfractionated)     Status: None   Collection Time: 10/10/19  4:43 PM  Result Value Ref Range   Heparin Unfractionated 0.66 0.30 - 0.70 IU/mL    Comment: (NOTE) If heparin results are below expected values, and patient dosage has  been confirmed, suggest follow up testing of antithrombin III levels. Performed at [facility], [address] [place], [place] [address]   CBC with Differential/Platelet     Status: Abnormal   Collection Time: 10/11/19  5:32 AM  Result Value Ref Range   WBC 6.2 4.0 - 10.5 K/uL   RBC 4.08 3.87 - 5.11 MIL/uL   Hemoglobin 11.8 (L) 12.0 - 15.0 g/dL   HCT 34.3 (L) 36 - 46 %   MCV 84.1 80.0 - 100.0 fL   MCH 28.9 26.0 - 34.0 pg   MCHC 34.4 30.0 - 36.0 g/dL   RDW 13.3 11.5 - 15.5 %   Platelets 137 (L) 150 - 400 K/uL    nRBC 0.0 0.0 - 0.2 %   Neutrophils Relative % 61 %   Neutro Abs 3.8 1.7 - 7.7 K/uL   Lymphocytes Relative 27 %   Lymphs Abs 1.7 0.7 - 4.0 K/uL   Monocytes Relative 11 %   Monocytes Absolute 0.7 0.1 - 1.0 K/uL   Eosinophils Relative 0 %   Eosinophils Absolute 0.0 0.0 - 0.5 K/uL   Basophils Relative 0 %   Basophils Absolute 0.0 0.0 - 0.1 K/uL   Immature Granulocytes 1 %   Abs Immature Granulocytes 0.06 0.00 - 0.07 K/uL    Comment: Performed at [facility], [address] [place], [place] [address]  Comprehensive metabolic panel     Status: Abnormal   Collection Time: 10/11/19  5:32 AM  Result Value Ref Range   Sodium 142 135 - 145 mmol/L   Potassium 3.5 3.5 - 5.1 mmol/L   Chloride 107 98 - 111 mmol/L   CO2 27 22 - 32 mmol/L   Glucose, Bld 127 (H) 70 - 99 mg/dL    Comment: Glucose reference range applies only to samples taken after fasting for at least 8 hours.   BUN 16 8 - 23 mg/dL   Creatinine, Ser 0.59 0.44 - 1.00 mg/dL   Calcium 8.0 (L) 8.9 - 10.3 mg/dL   Total Protein 5.7 (L) 6.5 - 8.1 g/dL   Albumin 3.0 (L) 3.5 - 5.0  g/dL   AST 36 15 - 41 U/L   ALT 26 0 - 44 U/L   Alkaline Phosphatase 50 38 - 126 U/L   Total Bilirubin 0.9 0.3 - 1.2 mg/dL   GFR calc non Af Amer >60 >60 mL/min   GFR calc Af Amer >60 >60 mL/min   Anion gap 8 5 - 15    Comment: Performed at [facility], [address] [place], [place] [address]  C-reactive protein     Status: Abnormal   Collection Time: 10/11/19  5:32 AM  Result Value Ref Range   CRP 1.4 (H) <1.0 mg/dL    Comment: Performed at [place] [address], [place], [place] [address]  Fibrin derivatives D-Dimer [facility] only)     Status: Abnormal   Collection Time: 10/11/19  5:32 AM  Result Value Ref Range   Fibrin derivatives D-dimer ([facility]) >7,500.00 (H) 0.00 - 499.00 ng/mL (FEU)    Comment: (NOTE) <> Exclusion of Venous Thromboembolism (VTE) - OUTPATIENT ONLY   (Emergency Department or [place])    0-499 ng/ml  (FEU): With a low to intermediate pretest probability                      for VTE this test result excludes the diagnosis                      of VTE.   >499 ng/ml (FEU) : VTE not excluded; additional work up for VTE is                      required.  <> Testing on Inpatients and Evaluation of Disseminated Intravascular   Coagulation (DIC) Reference Range:   0-499 ng/ml (FEU) Performed at [facility], [address], [place], [place] [address]   Magnesium     Status: None   Collection Time: 10/11/19  5:32 AM  Result Value Ref Range   Magnesium 1.7 1.7 - 2.4 mg/dL    Comment: Performed at [facility], [address], [place], [place] [address]  Ferritin     Status: None   Collection Time: 10/11/19  5:32 AM  Result Value Ref Range   Ferritin 256 11 - 307 ng/mL    Comment: Performed at [facility], [address] [place], [place] [address]  Lactate dehydrogenase     Status: None   Collection Time: 10/11/19  5:32 AM  Result Value Ref Range   LDH 178 98 - 192 U/L    Comment: Performed at [facility], [place], [place] [address]  Heparin level (unfractionated)     Status: Abnormal   Collection Time: 10/11/19  5:32 AM  Result Value Ref Range   Heparin Unfractionated 0.26 (L) 0.30 - 0.70 IU/mL    Comment: (NOTE) If heparin results are below expected values, and patient dosage has  been confirmed, suggest follow up testing of antithrombin III levels. Performed at [facility], [address] [place], [place] [address]   Troponin I (High Sensitivity)     Status: Abnormal   Collection Time: 10/11/19  5:32 AM  Result Value Ref Range   Troponin I (High Sensitivity) 162 (HH) <18 ng/L    Comment: CRITICAL VALUE NOTED. VALUE IS CONSISTENT WITH PREVIOUSLY REPORTED/CALLED VALUE.PMF (NOTE) Elevated high sensitivity troponin I (hsTnI) values and significant  changes across serial measurements may suggest ACS but many other   chronic  and acute conditions are known to elevate hsTnI results.  Refer to the "Links" section for chest pain algorithms and additional  guidance. Performed at [facility], [address] [place], [place] [address]   CBC with Differential/Platelet     Status: Abnormal   Collection Time: 10/12/19  4:08 AM  Result Value Ref Range   WBC 7.1 4.0 - 10.5 K/uL   RBC 4.11 3.87 - 5.11 MIL/uL   Hemoglobin 11.5 (L) 12.0 - 15.0 g/dL   HCT 35.4 (L) 36 - 46 %   MCV 86.1 80.0 - 100.0 fL   MCH 28.0 26.0 - 34.0 pg   MCHC 32.5 30.0 - 36.0 g/dL   RDW 13.0 11.5 - 15.5 %   Platelets 153 150 - 400 K/uL   nRBC 0.0 0.0 - 0.2 %   Neutrophils Relative % 84 %   Neutro Abs 5.9 1.7 - 7.7 K/uL   Lymphocytes Relative 11 %   Lymphs Abs 0.8 0.7 - 4.0 K/uL   Monocytes Relative 4 %   Monocytes Absolute 0.3 0.1 - 1.0 K/uL   Eosinophils Relative 0 %   Eosinophils Absolute 0.0 0.0 - 0.5 K/uL   Basophils Relative 0 %   Basophils Absolute 0.0 0.0 - 0.1 K/uL   Immature Granulocytes 1 %   Abs Immature Granulocytes 0.06 0.00 - 0.07 K/uL    Comment: Performed at [facility], [address] [place], [place] [address]  Comprehensive metabolic panel     Status: Abnormal   Collection Time: 10/12/19  4:08 AM  Result Value Ref Range   Sodium 140 135 - 145 mmol/L   Potassium 3.5 3.5 - 5.1 mmol/L   Chloride 105 98 - 111 mmol/L   CO2 27 22 - 32 mmol/L   Glucose, Bld 156 (H) 70 - 99 mg/dL    Comment: Glucose reference range applies only to samples taken after fasting for at least 8 hours.   BUN 20 8 - 23 mg/dL   Creatinine, Ser 0.53 0.44 - 1.00 mg/dL   Calcium 8.4 (L) 8.9 - 10.3 mg/dL   Total Protein 6.1 (L) 6.5 - 8.1 g/dL   Albumin 3.3 (L) 3.5 - 5.0 g/dL   AST 26 15 - 41 U/L   ALT 26 0 - 44 U/L   Alkaline Phosphatase 55 38 - 126 U/L   Total Bilirubin 0.6 0.3 - 1.2 mg/dL   GFR calc non Af Amer >60 >60 mL/min   GFR calc Af Amer >60 >60 mL/min   Anion gap 8 5 - 15    Comment: Performed at  [facility], [address] [place], [place] [address]  C-reactive protein     Status: None   Collection Time: 10/12/19  4:08 AM  Result Value Ref Range   CRP 0.8 <1.0 mg/dL    Comment: Performed at [place] [address], [place], [place] [address]  Fibrin derivatives D-Dimer [facility] only)     Status: Abnormal   Collection Time: 10/12/19  4:08 AM  Result Value Ref Range   Fibrin derivatives D-dimer ([facility]) 5,729.56 (H) 0.00 - 499.00 ng/mL (FEU)    Comment: (NOTE) <> Exclusion of Venous Thromboembolism (VTE) - OUTPATIENT ONLY   (Emergency Department or [place])    0-499 ng/ml (FEU): With a low to intermediate pretest probability                      for VTE this test result excludes the  diagnosis                      of VTE.   >499 ng/ml (FEU) : VTE not excluded; additional work up for VTE is                      required.  <> Testing on Inpatients and Evaluation of Disseminated Intravascular   Coagulation (DIC) Reference Range:   0-499 ng/ml (FEU) Performed at [facility], [address] [place], [place] [address]   Magnesium     Status: Abnormal   Collection Time: 10/12/19  4:08 AM  Result Value Ref Range   Magnesium 1.6 (L) 1.7 - 2.4 mg/dL    Comment: Performed at [facility], [address], [place], [place] [address]  Ferritin     Status: None   Collection Time: 10/12/19  4:08 AM  Result Value Ref Range   Ferritin 204 11 - 307 ng/mL    Comment: Performed at [facility], [address] [place], [place] [address]  Lactate dehydrogenase     Status: None   Collection Time: 10/12/19  4:08 AM  Result Value Ref Range   LDH 188 98 - 192 U/L    Comment: Performed at [facility], [place], [place] [address]  Troponin I (High Sensitivity)     Status: Abnormal   Collection Time: 10/12/19  4:33 AM  Result Value Ref Range   Troponin I (High Sensitivity) 81 (H) <18 ng/L    Comment: (NOTE) Elevated high  sensitivity troponin I (hsTnI) values and significant  changes across serial measurements may suggest ACS but many other  chronic and acute conditions are known to elevate hsTnI results.  Refer to the "Links" section for chest pain algorithms and additional  guidance. Performed at [facility], [address] [place], [place] [address]   CBC with Differential/Platelet     Status: Abnormal   Collection Time: 10/13/19  4:54 AM  Result Value Ref Range   WBC 12.1 (H) 4.0 - 10.5 K/uL   RBC 4.56 3.87 - 5.11 MIL/uL   Hemoglobin 12.7 12.0 - 15.0 g/dL   HCT 38.7 36 - 46 %   MCV 84.9 80.0 - 100.0 fL   MCH 27.9 26.0 - 34.0 pg   MCHC 32.8 30.0 - 36.0 g/dL   RDW 13.0 11.5 - 15.5 %   Platelets 199 150 - 400 K/uL   nRBC 0.0 0.0 - 0.2 %   Neutrophils Relative % 84 %   Neutro Abs 10.2 (H) 1.7 - 7.7 K/uL   Lymphocytes Relative 10 %   Lymphs Abs 1.2 0.7 - 4.0 K/uL   Monocytes Relative 4 %   Monocytes Absolute 0.4 0.1 - 1.0 K/uL   Eosinophils Relative 0 %   Eosinophils Absolute 0.0 0.0 - 0.5 K/uL   Basophils Relative 0 %   Basophils Absolute 0.0 0.0 - 0.1 K/uL   Immature Granulocytes 2 %   Abs Immature Granulocytes 0.28 (H) 0.00 - 0.07 K/uL    Comment: Performed at [facility], [address] [place],  [address]  Comprehensive metabolic panel     Status: Abnormal   Collection Time: 10/13/19  4:54 AM  Result Value Ref Range   Sodium 139 135 - 145 mmol/L   Potassium 3.8 3.5 - 5.1 mmol/L   Chloride 103 98 - 111 mmol/L   CO2 27 22 - 32 mmol/L  Glucose, Bld 130 (H) 70 - 99 mg/dL    Comment: Glucose reference range applies only to samples taken after fasting for at least 8 hours.   BUN 23 8 - 23 mg/dL   Creatinine, Ser 0.66 0.44 - 1.00 mg/dL   Calcium 8.4 (L) 8.9 - 10.3 mg/dL   Total Protein 6.5 6.5 - 8.1 g/dL   Albumin 3.5 3.5 - 5.0 g/dL   AST 21 15 - 41 U/L   ALT 23 0 - 44 U/L   Alkaline Phosphatase 57 38 - 126 U/L   Total Bilirubin 0.8 0.3 - 1.2 mg/dL   GFR  calc non Af Amer >60 >60 mL/min   GFR calc Af Amer >60 >60 mL/min   Anion gap 9 5 - 15    Comment: Performed at [facility], [address], [place], [place] [address]  Fibrin derivatives D-Dimer [facility] only)     Status: Abnormal   Collection Time: 10/13/19  4:54 AM  Result Value Ref Range   Fibrin derivatives D-dimer ([facility]) 4,971.08 (H) 0.00 - 499.00 ng/mL (FEU)    Comment: (NOTE) <> Exclusion of Venous Thromboembolism (VTE) - OUTPATIENT ONLY   (Emergency Department or [place])    0-499 ng/ml (FEU): With a low to intermediate pretest probability                      for VTE this test result excludes the diagnosis                      of VTE.   >499 ng/ml (FEU) : VTE not excluded; additional work up for VTE is                      required.  <> Testing on Inpatients and Evaluation of Disseminated Intravascular   Coagulation (DIC) Reference Range:   0-499 ng/ml (FEU) Performed at [facility], [address] [place], [place] [address]   C-reactive protein     Status: None   Collection Time: 10/13/19  4:54 AM  Result Value Ref Range   CRP 0.7 <1.0 mg/dL    Comment: Performed at [place] [address] [place], [place] [address]  Ferritin     Status: None   Collection Time: 10/13/19  4:54 AM  Result Value Ref Range   Ferritin 220 11 - 307 ng/mL    Comment: Performed at [facility], [address] [place], [place] [address]  Lactate dehydrogenase     Status: Abnormal   Collection Time: 10/13/19  4:54 AM  Result Value Ref Range   LDH 229 (H) 98 - 192 U/L    Comment: Performed at [facility], [address] [place], [place] [address]  Magnesium     Status: None   Collection Time: 10/13/19  4:54 AM  Result Value Ref Range   Magnesium 1.9 1.7 - 2.4 mg/dL    Comment: Performed at [facility], [place], [place] [address]  Troponin I (High Sensitivity)     Status: Abnormal   Collection Time: 10/13/19   4:54 AM  Result Value Ref Range   Troponin I (High Sensitivity) 37 (H) <18 ng/L    Comment: (NOTE) Elevated high sensitivity troponin I (hsTnI) values and significant  changes across serial measurements may suggest ACS but many other  chronic and acute conditions are known to elevate hsTnI results.  Refer to the "Links"  section for chest pain algorithms and additional  guidance. Performed at [facility], [address] [place], [place] [address]   Lipid Panel With LDL/HDL Ratio     Status: Abnormal   Collection Time: 11/19/19  4:04 PM  Result Value Ref Range   Cholesterol, Total 187 100 - 199 mg/dL   Triglycerides 93 0 - 149 mg/dL   HDL 47 >39 mg/dL   VLDL Cholesterol Cal 17 5 - 40 mg/dL   LDL Chol Calc (NIH) 123 (H) 0 - 99 mg/dL   LDL/HDL Ratio 2.6 0.0 - 3.2 ratio    Comment:                                     LDL/HDL Ratio                                             Men  Women                               1/2 Avg.Risk  1.0    1.5                                   Avg.Risk  3.6    3.2                                2X Avg.Risk  6.2    5.0                                3X Avg.Risk  8.0    6.1   CBC with Differential/Platelet     Status: None   Collection Time: 11/19/19  4:45 PM  Result Value Ref Range   WBC 7.3 3.4 - 10.8 x10E3/uL   RBC 4.41 3.77 - 5.28 x10E6/uL   Hemoglobin 12.5 11.1 - 15.9 g/dL   Hematocrit 37.7 34.0 - 46.6 %   MCV 86 79 - 97 fL   MCH 28.3 26.6 - 33.0 pg   MCHC 33.2 31 - 35 g/dL   RDW 13.3 11.7 - 15.4 %   Platelets 223 150 - 450 x10E3/uL   Neutrophils 70 Not Estab. %   Lymphs 21 Not Estab. %   Monocytes 7 Not Estab. %   Eos 2 Not Estab. %   Basos 0 Not Estab. %   Neutrophils Absolute 5.1 1.40 - 7.00 x10E3/uL   Lymphocytes Absolute 1.5 0 - 3 x10E3/uL   Monocytes Absolute 0.5 0 - 0 x10E3/uL   EOS (ABSOLUTE) 0.1 0.0 - 0.4 x10E3/uL   Basophils Absolute 0.0 0 - 0 x10E3/uL   Immature Granulocytes 0 Not Estab. %   Immature Grans (Abs) 0.0  0.0 - 0.1 F57D2/KG  Basic metabolic panel     Status: None   Collection Time: 11/19/19  4:45 PM  Result Value Ref Range   Glucose 92 65 - 99 mg/dL   BUN 15 8 - 27 mg/dL   Creatinine, Ser 0.75 0.57 - 1.00 mg/dL   GFR calc non Af Amer 84 >59 mL/min/1.73  GFR calc Af Amer 97 >59 mL/min/1.73    Comment: **In accordance with recommendations from the NKF-ASN Task force,**   [facility] is in the process of updating its eGFR calculation to the   2021 CKD-EPI creatinine equation that estimates kidney function   without a race variable.    BUN/Creatinine Ratio 20 12 - 28   Sodium 141 134 - 144 mmol/L   Potassium 3.7 3.5 - 5.2 mmol/L   Chloride 103 96 - 106 mmol/L   CO2 26 20 - 29 mmol/L   Calcium 9.4 8.7 - 10.3 mg/dL  TSH     Status: None   Collection Time: 11/19/19  4:45 PM  Result Value Ref Range   TSH 2.230 0.450 - 4.500 uIU/mL  B Nat Peptide     Status: None   Collection Time: 11/19/19  4:45 PM  Result Value Ref Range   BNP 25.3 0.0 - 100.0 pg/mL    Radiology No results found.  Assessment/Plan  Bilateral pulmonary embolism (HCC) Status post thrombectomy.  Clinically doing very well.  No significant residual symptoms.  Tolerating anticoagulation.  Should stay on anticoagulation for 1 year.  I will see the patient back as needed at this point.  Essential hypertension blood pressure control important in reducing the progression of atherosclerotic disease. On appropriate oral medications.   Acute hypoxemic respiratory failure due to COVID-19 [facility]) Largely recovered.  This is a nidus for thrombosis in many patients and likely was in her case.    Leotis Pain, MD  11/29/2019 10:26 AM    This note was created with Dragon medical transcription system.  Any errors from dictation are purely unintentional

## 2019-11-29 NOTE — Assessment & Plan Note (Signed)
Largely recovered.  This is a nidus for thrombosis in many patients and likely was in her case.

## 2019-11-29 NOTE — Assessment & Plan Note (Signed)
Status post thrombectomy.  Clinically doing very well.  No significant residual symptoms.  Tolerating anticoagulation.  Should stay on anticoagulation for 1 year.  I will see the patient back as needed at this point.

## 2019-12-17 ENCOUNTER — Ambulatory Visit (INDEPENDENT_AMBULATORY_CARE_PROVIDER_SITE_OTHER): Payer: Medicare Other | Admitting: Nurse Practitioner

## 2019-12-17 ENCOUNTER — Other Ambulatory Visit: Payer: Self-pay

## 2019-12-17 ENCOUNTER — Encounter: Payer: Self-pay | Admitting: Nurse Practitioner

## 2019-12-17 VITALS — BP 139/71 | HR 71 | Temp 97.9°F | Wt 290.2 lb

## 2019-12-17 DIAGNOSIS — Z6841 Body Mass Index (BMI) 40.0 and over, adult: Secondary | ICD-10-CM | POA: Diagnosis not present

## 2019-12-17 DIAGNOSIS — I10 Essential (primary) hypertension: Secondary | ICD-10-CM

## 2019-12-17 DIAGNOSIS — E782 Mixed hyperlipidemia: Secondary | ICD-10-CM | POA: Diagnosis not present

## 2019-12-17 DIAGNOSIS — I2699 Other pulmonary embolism without acute cor pulmonale: Secondary | ICD-10-CM | POA: Diagnosis not present

## 2019-12-17 DIAGNOSIS — R600 Localized edema: Secondary | ICD-10-CM

## 2019-12-17 NOTE — Assessment & Plan Note (Signed)
Recommended eating smaller high protein, low fat meals more frequently and exercising 30 mins a day 5 times a week with a goal of 10-15lb weight loss in the next 3 months. Patient voiced their understanding and motivation to adhere to these recommendations.  

## 2019-12-17 NOTE — Assessment & Plan Note (Signed)
New diagnosis with Covid in September.  Continue current Eliquis dosing and review vascular note reviewed -- to continue Eliquis for one year -- discontinue in September 2022.  Recommend continued use of compression hose at home, on during day and off at night.  Will get BMP outpatient.  Return in 6 months for physical.

## 2019-12-17 NOTE — Assessment & Plan Note (Signed)
Chronic, ongoing.  Continue current medication regimen and adjust as needed.  Will plan on outpatient fasting labs over next couple weeks to check levels.

## 2019-12-17 NOTE — Assessment & Plan Note (Signed)
BMI 51.41, has lost over 10 pounds since September.  Recommended eating smaller high protein, low fat meals more frequently and exercising 30 mins a day 5 times a week with a goal of 10-15lb weight loss in the next 3 months. Patient voiced their understanding and motivation to adhere to these recommendations.

## 2019-12-17 NOTE — Assessment & Plan Note (Signed)
Suspect some underlying lymphedema due to obesity.  Will continue compression hose to wear daily, on during day and off at night -- is offering benefit. Continue collaboration with vascular, may need compression pumps in future.

## 2019-12-17 NOTE — Patient Instructions (Signed)
Preventing High Cholesterol Cholesterol is a white, waxy substance similar to fat that the human body needs to help build cells. The liver makes all the cholesterol that a person's body needs. Having high cholesterol (hypercholesterolemia) increases a person's risk for heart disease and stroke. Extra (excess) cholesterol comes from the food the person eats. High cholesterol can often be prevented with diet and lifestyle changes. If you already have high cholesterol, you can control it with diet and lifestyle changes and with medicine. How can high cholesterol affect me? If you have high cholesterol, deposits (plaques) may build up on the walls of your arteries. The arteries are the blood vessels that carry blood away from your heart. Plaques make the arteries narrower and stiffer. This can limit or block blood flow and cause blood clots to form. Blood clots:  Are tiny balls of cells that form in your blood.  Can move to the heart or brain, causing a heart attack or stroke. Plaques in arteries greatly increase your risk for heart attack and stroke.Making diet and lifestyle changes can reduce your risk for these conditions that may threaten your life. What can increase my risk? This condition is more likely to develop in people who:  Eat foods that are high in saturated fat or cholesterol. Saturated fat is mostly found in: ? Foods that contain animal fat, such as red meat and some dairy products. ? Certain fatty foods made from plants, such as tropical oils.  Are overweight.  Are not getting enough exercise.  Have a family history of high cholesterol. What actions can I take to prevent this? Nutrition   Eat less saturated fat.  Avoid trans fats (partially hydrogenated oils). These are often found in margarine and in some baked goods, fried foods, and snacks bought in packages.  Avoid precooked or cured meat, such as sausages or meat loaves.  Avoid foods and drinks that have added  sugars.  Eat more fruits, vegetables, and whole grains.  Choose healthy sources of protein, such as fish, poultry, lean cuts of red meat, beans, peas, lentils, and nuts.  Choose healthy sources of fat, such as: ? Nuts. ? Vegetable oils, especially olive oil. ? Fish that have healthy fats (omega-3 fatty acids), such as mackerel or salmon. The items listed above may not be a complete list of recommended foods and beverages. Contact a dietitian for more information. Lifestyle  Lose weight if you are overweight. Losing 5-10 lb (2.3-4.5 kg) can help prevent or control high cholesterol. It can also lower your risk for diabetes and high blood pressure. Ask your health care provider to help you with a diet and exercise plan to lose weight safely.  Do not use any products that contain nicotine or tobacco, such as cigarettes, e-cigarettes, and chewing tobacco. If you need help quitting, ask your health care provider.  Limit your alcohol intake. ? Do not drink alcohol if:  Your health care provider tells you not to drink.  You are pregnant, may be pregnant, or are planning to become pregnant. ? If you drink alcohol:  Limit how much you use to:  0-1 drink a day for women.  0-2 drinks a day for men.  Be aware of how much alcohol is in your drink. In the [place] one drink equals one 12 oz bottle of beer (355 mL), one 5 oz glass of wine (148 mL), or one 1 oz glass of hard liquor (44 mL). Activity   Get enough exercise. Each week, do at   least 150 minutes of exercise that takes a medium level of effort (moderate-intensity exercise). ? This is exercise that:  Makes your heart beat faster and makes you breathe harder than usual.  Allows you to still be able to talk. ? You could exercise in short sessions several times a day or longer sessions a few times a week. For example, on 5 days each week, you could walk fast or ride your bike 3 times a day for 10 minutes each time.  Do exercises as told  by your health care provider. Medicines  In addition to diet and lifestyle changes, your health care provider may recommend medicines to help lower cholesterol. This may be a medicine to lower the amount of cholesterol your liver makes. You may need medicine if: ? Diet and lifestyle changes do not lower your cholesterol enough. ? You have high cholesterol and other risk factors for heart disease or stroke.  Take over-the-counter and prescription medicines only as told by your health care provider. General information  Manage your risk factors for high cholesterol. Talk with your health care provider about all your risk factors and how to lower your risk.  Manage other conditions that you have, such as diabetes or high blood pressure (hypertension).  Have blood tests to check your cholesterol levels at regular points in time as told by your health care provider.  Keep all follow-up visits as told by your health care provider. This is important. Where to find more information  American Heart Association: www.heart.org  National Heart, Lung, and Blood Institute: www.nhlbi.nih.gov Summary  High cholesterol increases your risk for heart disease and stroke. By keeping your cholesterol level low, you can reduce your risk for these conditions.  High cholesterol can often be prevented with diet and lifestyle changes.  Work with your health care provider to manage your risk factors, and have your blood tested regularly. This information is not intended to replace advice given to you by your health care provider. Make sure you discuss any questions you have with your health care provider. Document Revised: 05/11/2018 Document Reviewed: 09/26/2015 Elsevier Patient Education  2020 Elsevier Inc.  

## 2019-12-17 NOTE — Progress Notes (Signed)
BP 139/71   Pulse 71   Temp 97.9 F (36.6 C)   Wt 290 lb 3.2 oz (131.6 kg)   LMP  (LMP Unknown)   SpO2 97%   BMI 51.41 kg/m    Subjective:    Patient ID: Jasmine Webb, female    DOB: Sep 22, 1955, 64 y.o.   MRN: [id number]  HPI: Jasmine Webb is a 64 y.o. female  Chief Complaint  Patient presents with  . Hypertension  . HLD   HYPERTENSION / HYPERLIPIDEMIA Continues on Losartan 100 MG and Atorvastatin 40 MG daily.  Last saw vascular, Dr. Lucky Cowboy, on 11/29/2019 and is to return PRN.  Is to continue on Eliquis for one year due to PE with Covid -- will place at 10/09/2020.  She does have compression hose and is wearing them daily.   Satisfied with current treatment? yes Duration of hypertension: chronic BP monitoring frequency: a few times a week BP range: 130/70 range at home BP medication side effects: no Duration of hyperlipidemia: chronic Cholesterol medication side effects: no Cholesterol supplements: none Medication compliance: good compliance Aspirin: no Recent stressors: no Recurrent headaches: no Visual changes: no Palpitations: no Dyspnea: no Chest pain: no Lower extremity edema: no Dizzy/lightheaded: no  Relevant past medical, surgical, family and social history reviewed and updated as indicated. Interim medical history since our last visit reviewed. Allergies and medications reviewed and updated.  Review of Systems  Constitutional: Negative for activity change, appetite change, diaphoresis, fatigue and fever.  Respiratory: Negative for cough, chest tightness, shortness of breath and wheezing.   Cardiovascular: Positive for leg swelling (baseline). Negative for chest pain and palpitations.  Gastrointestinal: Negative.   Neurological: Negative.   Psychiatric/Behavioral: Negative.     Per HPI unless specifically indicated above     Objective:    BP 139/71   Pulse 71   Temp 97.9 F (36.6 C)   Wt 290 lb 3.2 oz (131.6 kg)   LMP  (LMP Unknown)   SpO2 97%    BMI 51.41 kg/m   Wt Readings from Last 3 Encounters:  12/17/19 290 lb 3.2 oz (131.6 kg)  11/29/19 291 lb 6.4 oz (132.2 kg)  11/19/19 290 lb 9.6 oz (131.8 kg)    Physical Exam Vitals and nursing note reviewed.  Constitutional:      General: She is awake. She is not in acute distress.    Appearance: She is well-developed and well-groomed. She is morbidly obese. She is not ill-appearing.  HENT:     Head: Normocephalic.     Right Ear: Hearing normal.     Left Ear: Hearing normal.  Eyes:     General: Lids are normal.        Right eye: No discharge.        Left eye: No discharge.     Conjunctiva/sclera: Conjunctivae normal.     Pupils: Pupils are equal, round, and reactive to light.  Neck:     Vascular: No carotid bruit.  Cardiovascular:     Rate and Rhythm: Normal rate and regular rhythm.     Pulses:          Dorsalis pedis pulses are 1+ on the right side and 1+ on the left side.       Posterior tibial pulses are 1+ on the right side and 1+ on the left side.     Heart sounds: Normal heart sounds. No murmur heard.  No gallop.      Comments: Baseline edema BLE Pulmonary:  Effort: Pulmonary effort is normal. No accessory muscle usage or respiratory distress.     Breath sounds: Normal breath sounds.  Abdominal:     General: Bowel sounds are normal.     Palpations: Abdomen is soft.  Musculoskeletal:     Cervical back: Normal range of motion and neck supple.     Right lower leg: 2+ Edema present.     Left lower leg: 2+ Edema present.  Skin:    General: Skin is warm and dry.  Neurological:     Mental Status: She is alert and oriented to person, place, and time.  Psychiatric:        Attention and Perception: Attention normal.        Mood and Affect: Mood normal.        Speech: Speech normal.        Behavior: Behavior normal. Behavior is cooperative.        Thought Content: Thought content normal.     Results for orders placed or performed in visit on 11/19/19  Lipid  Panel With LDL/HDL Ratio  Result Value Ref Range   Cholesterol, Total 187 100 - 199 mg/dL   Triglycerides 93 0 - 149 mg/dL   HDL 47 >39 mg/dL   VLDL Cholesterol Cal 17 5 - 40 mg/dL   LDL Chol Calc (NIH) 123 (H) 0 - 99 mg/dL   LDL/HDL Ratio 2.6 0.0 - 3.2 ratio  CBC with Differential/Platelet  Result Value Ref Range   WBC 7.3 3.4 - 10.8 x10E3/uL   RBC 4.41 3.77 - 5.28 x10E6/uL   Hemoglobin 12.5 11.1 - 15.9 g/dL   Hematocrit 37.7 34.0 - 46.6 %   MCV 86 79 - 97 fL   MCH 28.3 26.6 - 33.0 pg   MCHC 33.2 31 - 35 g/dL   RDW 13.3 11.7 - 15.4 %   Platelets 223 150 - 450 x10E3/uL   Neutrophils 70 Not Estab. %   Lymphs 21 Not Estab. %   Monocytes 7 Not Estab. %   Eos 2 Not Estab. %   Basos 0 Not Estab. %   Neutrophils Absolute 5.1 1.40 - 7.00 x10E3/uL   Lymphocytes Absolute 1.5 0 - 3 x10E3/uL   Monocytes Absolute 0.5 0 - 0 x10E3/uL   EOS (ABSOLUTE) 0.1 0.0 - 0.4 x10E3/uL   Basophils Absolute 0.0 0 - 0 x10E3/uL   Immature Granulocytes 0 Not Estab. %   Immature Grans (Abs) 0.0 0.0 - 0.1 E33I9/JJ  Basic metabolic panel  Result Value Ref Range   Glucose 92 65 - 99 mg/dL   BUN 15 8 - 27 mg/dL   Creatinine, Ser 0.75 0.57 - 1.00 mg/dL   GFR calc non Af Amer 84 >59 mL/min/1.73   GFR calc Af Amer 97 >59 mL/min/1.73   BUN/Creatinine Ratio 20 12 - 28   Sodium 141 134 - 144 mmol/L   Potassium 3.7 3.5 - 5.2 mmol/L   Chloride 103 96 - 106 mmol/L   CO2 26 20 - 29 mmol/L   Calcium 9.4 8.7 - 10.3 mg/dL  TSH  Result Value Ref Range   TSH 2.230 0.450 - 4.500 uIU/mL  B Nat Peptide  Result Value Ref Range   BNP 25.3 0.0 - 100.0 pg/mL      Assessment & Plan:   Problem List Items Addressed This Visit      Cardiovascular and Mediastinum   Essential hypertension    Ongoing with BP at goal today.  Recommend she monitor BP at  home regularly and document.  Will continue Losartan 100 MG daily and Lasix 20 MG to take as needed for edema.  BMP outpatient. Would avoid Amlodipine due to baseline edema  to BLE (suspect some lymphedema due to weight). Focus on DASH diet at home.  Return in 6 months.  Recommend: - Reminded to call for an overnight weight gain of >2 pounds or a weekly weight weight of >5 pounds - not adding salt to his food and has been reading food labels. Reviewed the importance of keeping daily sodium intake to 2000mg  daily       Relevant Orders   Basic metabolic panel   Bilateral pulmonary embolism ([place]) - Primary    New diagnosis with Covid in September.  Continue current Eliquis dosing and review vascular note reviewed -- to continue Eliquis for one year -- discontinue in September 2022.  Recommend continued use of compression hose at home, on during day and off at night.  Will get BMP outpatient.  Return in 6 months for physical.        Other   BMI 50.0-59.9, adult ([place])    Recommended eating smaller high protein, low fat meals more frequently and exercising 30 mins a day 5 times a week with a goal of 10-15lb weight loss in the next 3 months. Patient voiced their understanding and motivation to adhere to these recommendations.       Hyperlipidemia    Chronic, ongoing.  Continue current medication regimen and adjust as needed.  Will plan on outpatient fasting labs over next couple weeks to check levels.         Relevant Orders   Lipid Panel w/o Chol/HDL Ratio   Morbid obesity due to excess calories (HCC)    BMI 51.41, has lost over 10 pounds since September.  Recommended eating smaller high protein, low fat meals more frequently and exercising 30 mins a day 5 times a week with a goal of 10-15lb weight loss in the next 3 months. Patient voiced their understanding and motivation to adhere to these recommendations.       Bilateral lower extremity edema    Suspect some underlying lymphedema due to obesity.  Will continue compression hose to wear daily, on during day and off at night -- is offering benefit. Continue collaboration with vascular, may need compression  pumps in future.          Follow up plan: Return in about 6 months (around 06/15/2020) for Annual physical.

## 2019-12-17 NOTE — Assessment & Plan Note (Signed)
Ongoing with BP at goal today.  Recommend she monitor BP at home regularly and document.  Will continue Losartan 100 MG daily and Lasix 20 MG to take as needed for edema.  BMP outpatient. Would avoid Amlodipine due to baseline edema to BLE (suspect some lymphedema due to weight). Focus on DASH diet at home.  Return in 6 months.  Recommend: - Reminded to call for an overnight weight gain of >2 pounds or a weekly weight weight of >5 pounds - not adding salt to his food and has been reading food labels. Reviewed the importance of keeping daily sodium intake to 2000mg  daily

## 2019-12-19 ENCOUNTER — Telehealth: Payer: Self-pay | Admitting: Nurse Practitioner

## 2019-12-19 NOTE — Telephone Encounter (Signed)
Copied from [place] [id number]. Topic: Medicare AWV >> Dec 19, 2019 12:43 PM Cher Nakai R wrote: Reason for CRM:  Left message for patient to call back and schedule Medicare Annual Wellness Visit (AWV) to be done virtually.  No hx of AWV eligible as of 12/02/2019  Please schedule at anytime with [facility]-Nurse Health Advisor.      53 Minutes appointment   Any questions, please call me at [phone]

## 2020-01-03 ENCOUNTER — Ambulatory Visit: Payer: Medicare Other

## 2020-01-07 ENCOUNTER — Other Ambulatory Visit: Payer: Self-pay

## 2020-01-07 ENCOUNTER — Other Ambulatory Visit: Payer: Medicare Other

## 2020-01-07 DIAGNOSIS — E782 Mixed hyperlipidemia: Secondary | ICD-10-CM

## 2020-01-07 DIAGNOSIS — I10 Essential (primary) hypertension: Secondary | ICD-10-CM

## 2020-01-08 LAB — BASIC METABOLIC PANEL
BUN/Creatinine Ratio: 24 (ref 12–28)
BUN: 14 mg/dL (ref 8–27)
CO2: 26 mmol/L (ref 20–29)
Calcium: 8.8 mg/dL (ref 8.7–10.3)
Chloride: 104 mmol/L (ref 96–106)
Creatinine, Ser: 0.58 mg/dL (ref 0.57–1.00)
GFR calc Af Amer: 111 mL/min/{1.73_m2} (ref >59)
GFR calc non Af Amer: 96 mL/min/{1.73_m2} (ref >59)
Glucose: 100 mg/dL — ABNORMAL HIGH (ref 65–99)
Potassium: 3.9 mmol/L (ref 3.5–5.2)
Sodium: 141 mmol/L (ref 134–144)

## 2020-01-08 LAB — LIPID PANEL W/O CHOL/HDL RATIO
Cholesterol, Total: 117 mg/dL (ref 100–199)
HDL: 43 mg/dL (ref >39)
LDL Chol Calc (NIH): 58 mg/dL (ref 0–99)
Triglycerides: 83 mg/dL (ref 0–149)
VLDL Cholesterol Cal: 16 mg/dL (ref 5–40)

## 2020-01-08 NOTE — Progress Notes (Signed)
Good afternoon, please let Tarica know I am VERY proud.  Continue Atorvastatin at current dose, her LDL (bad cholesterol) is now at stroke prevention goal of less than 70 -- her level is 58, was 123 last check.  Her kidney function and electrolytes remain stable with exception of mild elevated in glucose.  Have a great day!! Keep being awesome!!  Thank you for allowing me to participate in your care. Kindest regards, Zohar Maroney

## 2020-01-13 ENCOUNTER — Ambulatory Visit (INDEPENDENT_AMBULATORY_CARE_PROVIDER_SITE_OTHER): Payer: Medicare Other

## 2020-01-13 VITALS — Ht 63.0 in | Wt 290.0 lb

## 2020-01-13 DIAGNOSIS — Z0000 Encounter for general adult medical examination without abnormal findings: Secondary | ICD-10-CM | POA: Diagnosis not present

## 2020-01-13 NOTE — Progress Notes (Signed)
I connected with Jasmine Webb today by telephone and verified that I am speaking with the correct person using two identifiers. Location patient: home Location provider: work Persons participating in the virtual visit: [name], Sing LPN.   I discussed the limitations, risks, security and privacy concerns of performing an evaluation and management service by telephone and the availability of in person appointments. I also discussed with the patient that there may be a patient responsible charge related to this service. The patient expressed understanding and verbally consented to this telephonic visit.    Interactive audio and video telecommunications were attempted between this provider and patient, however failed, due to patient having technical difficulties OR patient did not have access to video capability.  We continued and completed visit with audio only.     Vital signs may be patient reported or missing.  Subjective:   Jasmine Webb is a 64 y.o. female who presents for an Initial Medicare Annual Wellness Visit.  Review of Systems     Cardiac Risk Factors include: advanced age (>51men, >92 women);dyslipidemia;hypertension;obesity (BMI >30kg/m2);sedentary lifestyle     Objective:    Today's Vitals   01/13/20 1256  Weight: 290 lb (131.5 kg)  Height: 5\' 3"  (1.6 m)   Body mass index is 51.37 kg/m.  Advanced Directives 01/13/2020  Does Patient Have a Medical Advance Directive? Yes  Type of Paramedic of [place];Living will  Copy of [place] in Chart? No - copy requested    Current Medications (verified) Outpatient Encounter Medications as of 01/13/2020  Medication Sig  . acetaminophen (TYLENOL) 500 MG tablet Take 500 mg by mouth every 6 (six) hours as needed.  . Ascorbic Acid (VITAMIN C) 500 MG CAPS Take by mouth.  Marland Kitchen atorvastatin (LIPITOR) 40 MG tablet Take 1 tablet (40 mg total) by mouth daily.  . fexofenadine  (ALLEGRA ALLERGY) 180 MG tablet Take 1 tablet (180 mg total) by mouth daily.  Marland Kitchen FIBER PO Take by mouth daily.  . fluticasone (FLONASE) 50 MCG/ACT nasal spray Place 2 sprays into both nostrils daily.  . furosemide (LASIX) 20 MG tablet Take 1 tablet (20 mg total) by mouth as needed for edema. Take once daily as needed for edema.  Marland Kitchen losartan (COZAAR) 100 MG tablet Take 1 tablet (100 mg total) by mouth daily.  Marland Kitchen VITAMIN D PO Take 1,000 Units by mouth daily.   Marland Kitchen apixaban (ELIQUIS) 5 MG TABS tablet Take 1 tablet (5 mg total) by mouth 2 (two) times daily.   No facility-administered encounter medications on file as of 01/13/2020.    Allergies (verified) Ibuprofen   History: Past Medical History:  Diagnosis Date  . Arthritis   . Tumor    breast and pelvic   Past Surgical History:  Procedure Laterality Date  . ABDOMINAL HYSTERECTOMY  2003  . BREAST SURGERY    . PULMONARY THROMBECTOMY N/A 10/10/2019   Procedure: PULMONARY THROMBECTOMY;  Surgeon: Algernon Huxley, MD;  Location: [facility];  Service: Cardiovascular;  Laterality: N/A;   Family History  Problem Relation Age of Onset  . COPD Mother   . ALS Father   . Heart disease Sister   . Diabetes Brother   . Hypertension Brother   . Asthma Son   . Breast cancer Sister   . Colon cancer Sister   . Sinusitis Son    Social History   Socioeconomic History  . Marital status: Divorced    Spouse name: Not on  file  . Number of children: Not on file  . Years of education: Not on file  . Highest education level: Not on file  Occupational History  . Not on file  Tobacco Use  . Smoking status: Never Smoker  . Smokeless tobacco: Never Used  Vaping Use  . Vaping Use: Never used  Substance and Sexual Activity  . Alcohol use: Never  . Drug use: Never  . Sexual activity: Not Currently  Other Topics Concern  . Not on file  Social History Narrative  . Not on file   Social Determinants of Health   Financial Resource Strain: Low  Risk   . Difficulty of Paying Living Expenses: Not hard at all  Food Insecurity: No Food Insecurity  . Worried About Charity fundraiser in the Last Year: Never true  . Ran Out of Food in the Last Year: Never true  Transportation Needs: No Transportation Needs  . Lack of Transportation (Medical): No  . Lack of Transportation (Non-Medical): No  Physical Activity: Inactive  . Days of Exercise per Week: 0 days  . Minutes of Exercise per Session: 0 min  Stress: No Stress Concern Present  . Feeling of Stress : Not at all  Social Connections: Unknown  . Frequency of Communication with Friends and Family: More than three times a week  . Frequency of Social Gatherings with Friends and Family: More than three times a week  . Attends Religious Services: More than 4 times per year  . Active Member of Clubs or Organizations: No  . Attends Archivist Meetings: Never  . Marital Status: Not on file    Tobacco Counseling Counseling given: Not Answered   Clinical Intake:  Pre-visit preparation completed: Yes  Pain : No/denies pain     Nutritional Status: BMI > 30  Obese Nutritional Risks: None Diabetes: No  How often do you need to have someone help you when you read instructions, pamphlets, or other written materials from your doctor or pharmacy?: 1 - Never What is the last grade level you completed in school?: tech classes  Diabetic? no  Interpreter Needed?: No  Information entered by :: NAllen LPN   Activities of Daily Living In your present state of health, do you have any difficulty performing the following activities: 01/13/2020 10/13/2019  Hearing? N -  Comment decreased on left ear -  Vision? N -  Difficulty concentrating or making decisions? N -  Walking or climbing stairs? N -  Comment some -  Dressing or bathing? N -  Doing errands, shopping? N N  Preparing Food and eating ? N -  Using the Toilet? N -  In the past six months, have you accidently leaked  urine? Y -  Do you have problems with loss of bowel control? N -  Managing your Medications? N -  Managing your Finances? N -  Housekeeping or managing your Housekeeping? N -  Some recent data might be hidden    Patient Care Team: Venita Lick, NP as PCP - General (Nurse Practitioner)  Indicate any recent Medical Services you may have received from other than [facility] providers in the past year (date may be approximate).     Assessment:   This is a routine wellness examination for Jasmine Webb.  Hearing/Vision screen  Hearing Screening   125Hz  250Hz  500Hz  1000Hz  2000Hz  3000Hz  4000Hz  6000Hz  8000Hz   Right ear:           Left ear:  Vision Screening Comments: No regular eye exams  Dietary issues and exercise activities discussed: Current Exercise Habits: The patient does not participate in regular exercise at present  Goals    . Patient Stated     01/13/2020, wants to lose weight      Depression Screen PHQ 2/9 Scores 01/13/2020 05/22/2019 04/16/2019  PHQ - 2 Score 0 0 0    Fall Risk Fall Risk  01/13/2020 05/22/2019 04/16/2019  Falls in the past year? 0 0 0  Number falls in past yr: - 0 0  Injury with Fall? - 0 0  Risk for fall due to : Medication side effect - -  Follow up Falls evaluation completed;Education provided Falls evaluation completed Falls evaluation completed    [place]:  Any stairs in or around the home? Yes  If so, are there any without handrails? No  Home free of loose throw rugs in walkways, pet beds, electrical cords, etc? Yes  Adequate lighting in your home to reduce risk of falls? Yes   ASSISTIVE DEVICES UTILIZED TO PREVENT FALLS:  Life alert? No  Use of a cane, walker or w/c? Yes  Grab bars in the bathroom? Yes  Shower chair or bench in shower? Yes  Elevated toilet seat or a handicapped toilet? Yes   TIMED UP AND GO:  Was the test performed? No .  Cognitive Function:     6CIT Screen 01/13/2020   What Year? 0 points  What month? 0 points  What time? 0 points  Count back from 20 0 points  Months in reverse 2 points  Repeat phrase 2 points  Total Score 4    Immunizations  There is no immunization history on file for this patient.  TDAP status: Due, Education has been provided regarding the importance of this vaccine. Advised may receive this vaccine at local pharmacy or Health Dept. Aware to provide a copy of the vaccination record if obtained from local pharmacy or Health Dept. Verbalized acceptance and understanding.  Flu Vaccine status: Declined, Education has been provided regarding the importance of this vaccine but patient still declined. Advised may receive this vaccine at local pharmacy or Health Dept. Aware to provide a copy of the vaccination record if obtained from local pharmacy or Health Dept. Verbalized acceptance and understanding.  Pneumococcal vaccine status: Due, Education has been provided regarding the importance of this vaccine. Advised may receive this vaccine at local pharmacy or Health Dept. Aware to provide a copy of the vaccination record if obtained from local pharmacy or Health Dept. Verbalized acceptance and understanding.  Covid-19 vaccine status: Declined, Education has been provided regarding the importance of this vaccine but patient still declined. Advised may receive this vaccine at local pharmacy or Health Dept.or vaccine clinic. Aware to provide a copy of the vaccination record if obtained from local pharmacy or Health Dept. Verbalized acceptance and understanding.  Qualifies for Shingles Vaccine? Yes   Zostavax completed No   Shingrix Completed?: No.    Education has been provided regarding the importance of this vaccine. Patient has been advised to call insurance company to determine out of pocket expense if they have not yet received this vaccine. Advised may also receive vaccine at local pharmacy or Health Dept. Verbalized acceptance and  understanding.  Screening Tests Health Maintenance  Topic Date Due  . COVID-19 Vaccine (1) Never done  . TETANUS/TDAP  04/15/2020 (Originally 12/25/1974)  . Hepatitis C Screening  04/15/2020 (Originally Jan 27, 1956)  .  PNA vac Low Risk Adult (1 of 2 - PCV13) 04/15/2020 (Originally 12/26/2018)  . INFLUENZA VACCINE  04/30/2020 (Originally 09/01/2019)  . MAMMOGRAM  05/21/2020 (Originally 12/26/2003)  . DEXA SCAN  05/21/2020 (Originally 12/26/2018)  . COLON CANCER SCREENING ANNUAL FOBT  05/12/2020  . Fecal DNA (Cologuard)  Discontinued    Health Maintenance  Health Maintenance Due  Topic Date Due  . COVID-19 Vaccine (1) Never done    Colorectal cancer screening: Type of screening: FOBT/FIT. Completed 05/13/2019. Repeat every 1 years  Mammogram status: decline  Bone Density status: decline  Lung Cancer Screening: (Low Dose CT Chest recommended if Age 30-80 years, 30 pack-year currently smoking OR have quit w/in 15years.) does not qualify.   Lung Cancer Screening Referral: no  Additional Screening:  Hepatitis C Screening: does qualify; decline  Vision Screening: Recommended annual ophthalmology exams for early detection of glaucoma and other disorders of the eye. Is the patient up to date with their annual eye exam?  No  Who is the provider or what is the name of the office in which the patient attends annual eye exams? none If pt is not established with a provider, would they like to be referred to a provider to establish care? No .   Dental Screening: Recommended annual dental exams for proper oral hygiene  Community Resource Referral / Chronic Care Management: CRR required this visit?  No   CCM required this visit?  No      Plan:     I have personally reviewed and noted the following in the patient's chart:   . Medical and social history . Use of alcohol, tobacco or illicit drugs  . Current medications and supplements . Functional ability and status . Nutritional  status . Physical activity . Advanced directives . List of other physicians . Hospitalizations, surgeries, and ER visits in previous 12 months . Vitals . Screenings to include cognitive, depression, and falls . Referrals and appointments  In addition, I have reviewed and discussed with patient certain preventive protocols, quality metrics, and best practice recommendations. A written personalized care plan for preventive services as well as general preventive health recommendations were provided to patient.     Kellie Simmering, LPN   [id number]   Nurse Notes:

## 2020-01-13 NOTE — Patient Instructions (Signed)
Jasmine Webb , Thank you for taking time to come for your Medicare Wellness Visit. I appreciate your ongoing commitment to your health goals. Please review the following plan we discussed and let me know if I can assist you in the future.   Screening recommendations/referrals: Colonoscopy: FOBT 05/13/2019, due 05/12/2020 Mammogram: decline Bone Density: decline Recommended yearly ophthalmology/optometry visit for glaucoma screening and checkup Recommended yearly dental visit for hygiene and checkup  Vaccinations: Influenza vaccine: decline Pneumococcal vaccine: decline Tdap vaccine: decline Shingles vaccine: decline   Covid-19:decline  Advanced directives:  Please bring a copy of your POA (Power of Attorney) and/or Living Will to your next appointment.   Conditions/risks identified: none  Next appointment: Follow up in one year for your annual wellness visit    Preventive Care 65 Years and Older, Female Preventive care refers to lifestyle choices and visits with your health care provider that can promote health and wellness. What does preventive care include?  A yearly physical exam. This is also called an annual well check.  Dental exams once or twice a year.  Routine eye exams. Ask your health care provider how often you should have your eyes checked.  Personal lifestyle choices, including:  Daily care of your teeth and gums.  Regular physical activity.  Eating a healthy diet.  Avoiding tobacco and drug use.  Limiting alcohol use.  Practicing safe sex.  Taking low-dose aspirin every day.  Taking vitamin and mineral supplements as recommended by your health care provider. What happens during an annual well check? The services and screenings done by your health care provider during your annual well check will depend on your age, overall health, lifestyle risk factors, and family history of disease. Counseling  Your health care provider may ask you questions about  your:  Alcohol use.  Tobacco use.  Drug use.  Emotional well-being.  Home and relationship well-being.  Sexual activity.  Eating habits.  History of falls.  Memory and ability to understand (cognition).  Work and work Statistician.  Reproductive health. Screening  You may have the following tests or measurements:  Height, weight, and BMI.  Blood pressure.  Lipid and cholesterol levels. These may be checked every 5 years, or more frequently if you are over 61 years old.  Skin check.  Lung cancer screening. You may have this screening every year starting at age 50 if you have a 30-pack-year history of smoking and currently smoke or have quit within the past 15 years.  Fecal occult blood test (FOBT) of the stool. You may have this test every year starting at age 88.  Flexible sigmoidoscopy or colonoscopy. You may have a sigmoidoscopy every 5 years or a colonoscopy every 10 years starting at age 93.  Hepatitis C blood test.  Hepatitis B blood test.  Sexually transmitted disease (STD) testing.  Diabetes screening. This is done by checking your blood sugar (glucose) after you have not eaten for a while (fasting). You may have this done every 1-3 years.  Bone density scan. This is done to screen for osteoporosis. You may have this done starting at age 46.  Mammogram. This may be done every 1-2 years. Talk to your health care provider about how often you should have regular mammograms. Talk with your health care provider about your test results, treatment options, and if necessary, the need for more tests. Vaccines  Your health care provider may recommend certain vaccines, such as:  Influenza vaccine. This is recommended every year.  Tetanus, diphtheria,  and acellular pertussis (Tdap, Td) vaccine. You may need a Td booster every 10 years.  Zoster vaccine. You may need this after age 95.  Pneumococcal 13-valent conjugate (PCV13) vaccine. One dose is recommended  after age 56.  Pneumococcal polysaccharide (PPSV23) vaccine. One dose is recommended after age 57. Talk to your health care provider about which screenings and vaccines you need and how often you need them. This information is not intended to replace advice given to you by your health care provider. Make sure you discuss any questions you have with your health care provider. Document Released: 02/13/2015 Document Revised: 10/07/2015 Document Reviewed: 11/18/2014 Elsevier Interactive Patient Education  2017 [employer] Prevention in the Home Falls can cause injuries. They can happen to people of all ages. There are many things you can do to make your home safe and to help prevent falls. What can I do on the outside of my home?  Regularly fix the edges of walkways and driveways and fix any cracks.  Remove anything that might make you trip as you walk through a door, such as a raised step or threshold.  Trim any bushes or trees on the path to your home.  Use bright outdoor lighting.  Clear any walking paths of anything that might make someone trip, such as rocks or tools.  Regularly check to see if handrails are loose or broken. Make sure that both sides of any steps have handrails.  Any raised decks and porches should have guardrails on the edges.  Have any leaves, snow, or ice cleared regularly.  Use sand or salt on walking paths during winter.  Clean up any spills in your garage right away. This includes oil or grease spills. What can I do in the bathroom?  Use night lights.  Install grab bars by the toilet and in the tub and shower. Do not use towel bars as grab bars.  Use non-skid mats or decals in the tub or shower.  If you need to sit down in the shower, use a plastic, non-slip stool.  Keep the floor dry. Clean up any water that spills on the floor as soon as it happens.  Remove soap buildup in the tub or shower regularly.  Attach bath mats securely with  double-sided non-slip rug tape.  Do not have throw rugs and other things on the floor that can make you trip. What can I do in the bedroom?  Use night lights.  Make sure that you have a light by your bed that is easy to reach.  Do not use any sheets or blankets that are too big for your bed. They should not hang down onto the floor.  Have a firm chair that has side arms. You can use this for support while you get dressed.  Do not have throw rugs and other things on the floor that can make you trip. What can I do in the kitchen?  Clean up any spills right away.  Avoid walking on wet floors.  Keep items that you use a lot in easy-to-reach places.  If you need to reach something above you, use a strong step stool that has a grab bar.  Keep electrical cords out of the way.  Do not use floor polish or wax that makes floors slippery. If you must use wax, use non-skid floor wax.  Do not have throw rugs and other things on the floor that can make you trip. What can I do with my  stairs?  Do not leave any items on the stairs.  Make sure that there are handrails on both sides of the stairs and use them. Fix handrails that are broken or loose. Make sure that handrails are as long as the stairways.  Check any carpeting to make sure that it is firmly attached to the stairs. Fix any carpet that is loose or worn.  Avoid having throw rugs at the top or bottom of the stairs. If you do have throw rugs, attach them to the floor with carpet tape.  Make sure that you have a light switch at the top of the stairs and the bottom of the stairs. If you do not have them, ask someone to add them for you. What else can I do to help prevent falls?  Wear shoes that:  Do not have high heels.  Have rubber bottoms.  Are comfortable and fit you well.  Are closed at the toe. Do not wear sandals.  If you use a stepladder:  Make sure that it is fully opened. Do not climb a closed stepladder.  Make  sure that both sides of the stepladder are locked into place.  Ask someone to hold it for you, if possible.  Clearly mark and make sure that you can see:  Any grab bars or handrails.  First and last steps.  Where the edge of each step is.  Use tools that help you move around (mobility aids) if they are needed. These include:  Canes.  Walkers.  Scooters.  Crutches.  Turn on the lights when you go into a dark area. Replace any light bulbs as soon as they burn out.  Set up your furniture so you have a clear path. Avoid moving your furniture around.  If any of your floors are uneven, fix them.  If there are any pets around you, be aware of where they are.  Review your medicines with your doctor. Some medicines can make you feel dizzy. This can increase your chance of falling. Ask your doctor what other things that you can do to help prevent falls. This information is not intended to replace advice given to you by your health care provider. Make sure you discuss any questions you have with your health care provider. Document Released: 11/13/2008 Document Revised: 06/25/2015 Document Reviewed: 02/21/2014 Elsevier Interactive Patient Education  2017 [employer].

## 2020-02-18 ENCOUNTER — Other Ambulatory Visit: Payer: Self-pay | Admitting: Nurse Practitioner

## 2020-02-25 ENCOUNTER — Other Ambulatory Visit: Payer: Self-pay | Admitting: Nurse Practitioner

## 2020-03-03 ENCOUNTER — Other Ambulatory Visit: Payer: Self-pay | Admitting: Nurse Practitioner

## 2020-03-03 NOTE — Telephone Encounter (Signed)
Medication Refill - Medication: furosemide (LASIX) 20 MG tablet     Preferred Pharmacy (with phone number or street name):  [id number] [place] [address] Phone:  [phone]  Fax:  [phone]       Agent: Please be advised that RX refills may take up to 3 business days. We ask that you follow-up with your pharmacy.

## 2020-04-01 ENCOUNTER — Other Ambulatory Visit: Payer: Self-pay | Admitting: Nurse Practitioner

## 2020-04-20 ENCOUNTER — Ambulatory Visit (INDEPENDENT_AMBULATORY_CARE_PROVIDER_SITE_OTHER): Payer: Medicare Other | Admitting: Family Medicine

## 2020-04-20 ENCOUNTER — Ambulatory Visit: Payer: Self-pay | Admitting: *Deleted

## 2020-04-20 ENCOUNTER — Encounter: Payer: Self-pay | Admitting: Family Medicine

## 2020-04-20 ENCOUNTER — Telehealth: Payer: Self-pay | Admitting: *Deleted

## 2020-04-20 VITALS — BP 136/73 | Temp 99.0°F

## 2020-04-20 DIAGNOSIS — J301 Allergic rhinitis due to pollen: Secondary | ICD-10-CM | POA: Diagnosis not present

## 2020-04-20 MED ORDER — PREDNISONE 50 MG PO TABS: 50.0000 mg | ORAL_TABLET | Freq: Every day | ORAL | 0 refills | Status: DC

## 2020-04-20 MED ORDER — LEVOCETIRIZINE DIHYDROCHLORIDE 5 MG PO TABS: 5.0000 mg | ORAL_TABLET | Freq: Every evening | ORAL | 3 refills | Status: DC

## 2020-04-20 NOTE — Progress Notes (Signed)
BP 136/73   Temp 99 F (37.2 C)   LMP  (LMP Unknown)    Subjective:    Patient ID: Jasmine Webb, female    DOB: Oct 19, 1955, 65 y.o.   MRN: [id number]  HPI: Jasmine Webb is a 65 y.o. female  Chief Complaint  Patient presents with  . Sinusitis    Patient states she is having sinus pressure, ST, slight cough and sneezing, coughing up light yellow drainage. Patient states she has been sick for 4 days.    UPPER RESPIRATORY TRACT INFECTION Duration: 4 days Worst symptom: congestion and drainage Fever: no Cough: yes Shortness of breath: yes Wheezing: no Chest pain: no Chest tightness: no Chest congestion: no Nasal congestion: yes Runny nose: yes Post nasal drip: yes Sneezing: yes Sore throat: yes Swollen glands: no Sinus pressure: yes Headache: no Face pain: no Toothache: no Ear pain: no  Ear pressure: no  Eyes red/itching:no Eye drainage/crusting: no  Vomiting: no Rash: no Fatigue: no Sick contacts: no Strep contacts: no  Context: stable Recurrent sinusitis: no Relief with OTC cold/cough medications: no  Treatments attempted: none   Relevant past medical, surgical, family and social history reviewed and updated as indicated. Interim medical history since our last visit reviewed. Allergies and medications reviewed and updated.  Review of Systems  Constitutional: Negative.   HENT: Positive for congestion, postnasal drip, rhinorrhea, sinus pressure and sore throat. Negative for dental problem, drooling, ear discharge, ear pain, facial swelling, hearing loss, mouth sores, nosebleeds, sinus pain, sneezing, tinnitus, trouble swallowing and voice change.   Eyes: Negative.   Respiratory: Positive for cough and shortness of breath. Negative for apnea, choking, chest tightness, wheezing and stridor.   Cardiovascular: Negative.   Gastrointestinal: Negative.   Musculoskeletal: Negative.   Neurological: Negative.   Psychiatric/Behavioral: Negative.     Per HPI unless  specifically indicated above     Objective:    BP 136/73   Temp 99 F (37.2 C)   LMP  (LMP Unknown)   Wt Readings from Last 3 Encounters:  01/13/20 290 lb (131.5 kg)  12/17/19 290 lb 3.2 oz (131.6 kg)  11/29/19 291 lb 6.4 oz (132.2 kg)    Physical Exam Vitals and nursing note reviewed.  Pulmonary:     Effort: Pulmonary effort is normal. No respiratory distress.     Comments: Speaking in full sentences Neurological:     Mental Status: She is alert.  Psychiatric:        Mood and Affect: Mood normal.        Behavior: Behavior normal.        Thought Content: Thought content normal.        Judgment: Judgment normal.     Results for orders placed or performed in visit on 01/07/20  Lipid Panel w/o Chol/HDL Ratio  Result Value Ref Range   Cholesterol, Total 117 100 - 199 mg/dL   Triglycerides 83 0 - 149 mg/dL   HDL 43 >39 mg/dL   VLDL Cholesterol Cal 16 5 - 40 mg/dL   LDL Chol Calc (NIH) 58 0 - 99 mg/dL  Basic metabolic panel  Result Value Ref Range   Glucose 100 (H) 65 - 99 mg/dL   BUN 14 8 - 27 mg/dL   Creatinine, Ser 0.58 0.57 - 1.00 mg/dL   GFR calc non Af Amer 96 >59 mL/min/1.73   GFR calc Af Amer 111 >59 mL/min/1.73   BUN/Creatinine Ratio 24 12 - 28   Sodium 141 134 - 144  mmol/L   Potassium 3.9 3.5 - 5.2 mmol/L   Chloride 104 96 - 106 mmol/L   CO2 26 20 - 29 mmol/L   Calcium 8.8 8.7 - 10.3 mg/dL      Assessment & Plan:   Problem List Items Addressed This Visit      Respiratory   Allergic rhinitis - Primary       Follow up plan: No follow-ups on file.   . This visit was completed via telephone due to the restrictions of the COVID-19 pandemic. All issues as above were discussed and addressed but no physical exam was performed. If it was felt that the patient should be evaluated in the office, they were directed there. The patient verbally consented to this visit. Patient was unable to complete an audio/visual visit due to Lack of equipment. Due to the  catastrophic nature of the COVID-19 pandemic, this visit was done through audio contact only. . Location of the patient: home . Location of the provider: work . Those involved with this call:  . Provider: Park Liter, DO . CMA: Louanna Raw, Mar[place] . Front Desk/Registration: Jill Side  . Time spent on call: 21 minutes on the phone discussing health concerns. 30 minutes total spent in review of patient's record and preparation of their chart.

## 2020-04-20 NOTE — Telephone Encounter (Signed)
The pt says she has shortness breath when she blows her nose only; she has yellow nasal secretions and congestion; she denies chest pain and shortness of breath; the pt was previously scheduled to see  Dr Park Liter, [facility], 04/20/20 at 1420; pt advised to keep this appt; she verbalized understanding, will route to office for notification of encounter; also see telephone encounter dated 04/20/20 Reason for Disposition . [1] MODERATE longstanding difficulty breathing (e.g., speaks in phrases, SOB even at rest, pulse 100-120) AND [2] SAME as normal  Answer Assessment - Initial Assessment Questions 1. RESPIRATORY STATUS: "Describe your breathing?" (e.g., wheezing, shortness of breath, unable to speak, severe coughing)      Shortness of breath when blowing nose 2. ONSET: "When did this breathing problem begin?"      3. PATTERN "Does the difficult breathing come and go, or has it been constant since it started?"      intermittent 4. SEVERITY: "How bad is your breathing?" (e.g., mild, moderate, severe)    - MILD: No SOB at rest, mild SOB with walking, speaks normally in sentences, can lay down, no retractions, pulse < 100.    - MODERATE: SOB at rest, SOB with minimal exertion and prefers to sit, cannot lie down flat, speaks in phrases, mild retractions, audible wheezing, pulse 100-120.    - SEVERE: Very SOB at rest, speaks in single words, struggling to breathe, sitting hunched forward, retractions, pulse > 120      mild 5. RECURRENT SYMPTOM: "Have you had difficulty breathing before?" If Yes, ask: "When was the last time?" and "What happened that time?"      6. CARDIAC HISTORY: "Do you have any history of heart disease?" (e.g., heart attack, angina, bypass surgery, angioplasty)    htn 7. LUNG HISTORY: "Do you have any history of lung disease?"  (e.g., pulmonary embolus, asthma, emphysema)   Blood clot 8. CAUSE: "What do you think is causing the breathing problem?"      Nasal  congestion 9. OTHER SYMPTOMS: "Do you have any other symptoms? (e.g., dizziness, runny nose, cough, chest pain, fever)     Yellow nasal secretions 10. PREGNANCY: "Is there any chance you are pregnant?" "When was your last menstrual period?"       11. TRAVEL: "Have you traveled out of the country in the last month?" (e.g., travel history, exposures)  Protocols used: BREATHING DIFFICULTY-A-AH

## 2020-04-20 NOTE — Telephone Encounter (Signed)
Noted  

## 2020-04-20 NOTE — Telephone Encounter (Signed)
Agent contacted nurse triage request follow up call to patient that she had scheduled appointment for- patient has multiple complaints- sinus, cough, chest congestions, concerns about pneumonia. Attempted to call patient- but got voice mail- advised call office to speak to nurse about her symptoms- advised she make sure she keeps appointment.

## 2020-04-20 NOTE — Assessment & Plan Note (Signed)
Will treat with xyzal and prednisone burst. Call if not better by end of the week and we'll treat for sinusitis. Continue to monitor.

## 2020-06-07 ENCOUNTER — Encounter: Payer: Self-pay | Admitting: Emergency Medicine

## 2020-06-07 ENCOUNTER — Emergency Department
Admission: EM | Admit: 2020-06-07 | Discharge: 2020-06-07 | Disposition: A | Discharge status: Home / Self Care | Payer: Medicare Other | Attending: Emergency Medicine | Admitting: Emergency Medicine

## 2020-06-07 ENCOUNTER — Other Ambulatory Visit: Payer: Self-pay

## 2020-06-07 DIAGNOSIS — R110 Nausea: Secondary | ICD-10-CM | POA: Diagnosis not present

## 2020-06-07 DIAGNOSIS — R509 Fever, unspecified: Secondary | ICD-10-CM | POA: Diagnosis not present

## 2020-06-07 DIAGNOSIS — Z8616 Personal history of COVID-19: Secondary | ICD-10-CM | POA: Diagnosis not present

## 2020-06-07 DIAGNOSIS — Z79899 Other long term (current) drug therapy: Secondary | ICD-10-CM | POA: Diagnosis not present

## 2020-06-07 DIAGNOSIS — L03116 Cellulitis of left lower limb: Secondary | ICD-10-CM | POA: Insufficient documentation

## 2020-06-07 DIAGNOSIS — I10 Essential (primary) hypertension: Secondary | ICD-10-CM | POA: Diagnosis not present

## 2020-06-07 MED ORDER — SULFAMETHOXAZOLE-TRIMETHOPRIM 800-160 MG PO TABS: 1.0000 | ORAL_TABLET | Freq: Two times a day (BID) | ORAL | 0 refills | Status: DC

## 2020-06-07 NOTE — ED Triage Notes (Signed)
Pt reports spider bite to left leg for the last 30 hours.

## 2020-06-07 NOTE — ED Provider Notes (Signed)
[facility] Emergency Department Provider Note  ____________________________________________  Time seen: Approximately 10:40 AM  I have reviewed the triage vital signs and the nursing notes.   HISTORY  Chief Complaint Insect Bite   HPI Jasmine Webb is a 65 y.o. female presents to the emergency department for treatment and evaluation of lesion to the left lower extremity that has been present for the past 2 days.  She did not see or feel an insect bite, but has no other explanation for the draining wound above her Achilles on the left calf.  She reports subjective fever and occasional nausea.  No vomiting.   She reports a remote history of a poor healing wound following a brown recluse bite.  Past Medical History:  Diagnosis Date  . Arthritis   . Tumor    breast and pelvic    Patient Active Problem List   Diagnosis Date Noted  . Bilateral lower extremity edema 11/19/2019  . History of COVID-19 10/10/2019  . Bilateral pulmonary embolism ([place]) 10/10/2019  . Morbid obesity due to excess calories ([place]) 08/09/2019  . Hyperlipidemia 07/05/2019  . Vitamin D deficiency 07/05/2019  . Essential hypertension 04/16/2019  . Preventative health care 04/16/2019  . Allergic rhinitis 04/16/2019  . Arthritis 04/16/2019  . BMI 50.0-59.9, adult ([place]) 04/16/2019    Past Surgical History:  Procedure Laterality Date  . ABDOMINAL HYSTERECTOMY  2003  . BREAST SURGERY    . PULMONARY THROMBECTOMY N/A 10/10/2019   Procedure: PULMONARY THROMBECTOMY;  Surgeon: Algernon Huxley, MD;  Location: [facility];  Service: Cardiovascular;  Laterality: N/A;    Prior to Admission medications   Medication Sig Start Date End Date Taking? Authorizing Provider  sulfamethoxazole-trimethoprim (BACTRIM DS) 800-160 MG tablet Take 1 tablet by mouth 2 (two) times daily. 06/07/20  Yes Vladislav Axelson B, FNP  acetaminophen (TYLENOL) 500 MG tablet Take 500 mg by mouth every 6 (six) hours as needed.     [provider]  Ascorbic Acid (VITAMIN C) 500 MG CAPS Take by mouth.    [provider]  atorvastatin (LIPITOR) 40 MG tablet Take 1 tablet (40 mg total) by mouth daily. 11/22/19   Cannady, Jolene T, NP  ELIQUIS 5 MG TABS tablet TAKE 1 TABLET BY MOUTH TWICE A DAY 03/03/20   Cannady, Jolene T, NP  fexofenadine (ALLEGRA ALLERGY) 180 MG tablet Take 1 tablet (180 mg total) by mouth daily. 11/05/19   Cannady, Henrine Screws T, NP  FIBER PO Take by mouth daily.    [provider]  fluticasone (FLONASE) 50 MCG/ACT nasal spray Place 2 sprays into both nostrils daily.    [provider]  furosemide (LASIX) 20 MG tablet TAKE 1 TABLET BY MOUTH EVERY DAY AS NEEDED FOR EDEMA 04/01/20   Cannady, Henrine Screws T, NP  levocetirizine (XYZAL) 5 MG tablet Take 1 tablet (5 mg total) by mouth every evening. 04/20/20   Wynetta Emery, Megan P, DO  losartan (COZAAR) 100 MG tablet Take 1 tablet (100 mg total) by mouth daily. 11/19/19   Cannady, Henrine Screws T, NP  predniSONE (DELTASONE) 50 MG tablet Take 1 tablet (50 mg total) by mouth daily with breakfast. 04/20/20   Park Liter P, DO  VITAMIN D PO Take 1,000 Units by mouth daily.     [provider]    Allergies Ibuprofen  Family History  Problem Relation Age of Onset  . COPD Mother   . ALS Father   . Heart disease Sister   . Diabetes Brother   .  Hypertension Brother   . Asthma Son   . Breast cancer Sister   . Colon cancer Sister   . Sinusitis Son     Social History Social History   Tobacco Use  . Smoking status: Never Smoker  . Smokeless tobacco: Never Used  Vaping Use  . Vaping Use: Never used  Substance Use Topics  . Alcohol use: Never  . Drug use: Never    Review of Systems  Constitutional: Positive for fever. Respiratory: Negative for cough or shortness of breath.  Musculoskeletal: Negative for myalgias Skin: Positive for open wound/lesion and erythema Neurological: Negative for numbness or  paresthesias. ____________________________________________   PHYSICAL EXAM:  VITAL SIGNS: ED Triage Vitals  Enc Vitals Group     BP 06/07/20 1001 (!) 152/105     Pulse Rate 06/07/20 1001 (!) 110     Resp 06/07/20 1001 20     Temp 06/07/20 1000 97.7 F (36.5 C)     Temp Source 06/07/20 1000 Oral     SpO2 06/07/20 1001 96 %     Weight 06/07/20 1000 300 lb (136.1 kg)     Height 06/07/20 0959 5\' 3"  (1.6 m)     Head Circumference --      Peak Flow --      Pain Score 06/07/20 0959 4     Pain Loc --      Pain Edu? --      Excl. in [place]? --      Constitutional: Overall well appearing. Eyes: Conjunctivae are clear without discharge or drainage. Nose: No rhinorrhea noted. Mouth/Throat: Airway is patent.  Neck: No stridor. Unrestricted range of motion observed. Cardiovascular: Capillary refill is <3 seconds.  Respiratory: Respirations are even and unlabored.. Musculoskeletal: Unrestricted range of motion observed. Neurologic: Awake, alert, and oriented x 4.  Skin: Ulcerated, weeping lesion measuring approximately 1 cm in diameter with erythema and induration approximately 3.5 cm surrounding the lesion.  ____________________________________________   LABS (all labs ordered are listed, but only abnormal results are displayed)  Labs Reviewed - No data to display ____________________________________________  EKG  Not indicated. ____________________________________________  RADIOLOGY  Not indicated ____________________________________________   PROCEDURES  Procedures ____________________________________________   INITIAL IMPRESSION / ASSESSMENT AND PLAN / ED COURSE  Jasmine Webb is a 65 y.o. female presenting to the emergency department for treatment and evaluation of a lesion to the left lower extremity.  See HPI for further details.  Lesion is open and draining serosanguineous fluid.  There is surrounding cellulitis.  Plan will be to treat her with Bactrim and have her  follow-up with primary care.  She was advised that she should return to the emergency department if symptoms change or worsen and she is unable to see primary care.   Medications - No data to display   Pertinent labs & imaging results that were available during my care of the patient were reviewed by me and considered in my medical decision making (see chart for details).  ____________________________________________   FINAL CLINICAL IMPRESSION(S) / ED DIAGNOSES  Final diagnoses:  Cellulitis of left lower extremity    ED Discharge Orders         Ordered    sulfamethoxazole-trimethoprim (BACTRIM DS) 800-160 MG tablet  2 times daily        06/07/20 1058           Note:  This document was prepared using Dragon voice recognition software and may include unintentional dictation errors.   Victorino Dike, FNP  06/07/20 1631    Naaman Plummer, MD 06/08/20 [phone]

## 2020-06-07 NOTE — Discharge Instructions (Signed)
Please follow up with primary care if not improving over the next 2-3 days or sooner if you feel you are getting worse.  Return to the ER for symptoms of concern if unable to see primary care.

## 2020-06-09 ENCOUNTER — Telehealth: Payer: Self-pay

## 2020-06-09 NOTE — Telephone Encounter (Signed)
Lvm to r/s 5/17 appt

## 2020-06-11 NOTE — Telephone Encounter (Signed)
r/s'd

## 2020-06-16 ENCOUNTER — Encounter: Payer: Medicare Other | Admitting: Nurse Practitioner

## 2020-06-23 ENCOUNTER — Ambulatory Visit (INDEPENDENT_AMBULATORY_CARE_PROVIDER_SITE_OTHER): Payer: Medicare Other | Admitting: Nurse Practitioner

## 2020-06-23 ENCOUNTER — Encounter: Payer: Self-pay | Admitting: Nurse Practitioner

## 2020-06-23 ENCOUNTER — Other Ambulatory Visit: Payer: Self-pay

## 2020-06-23 ENCOUNTER — Other Ambulatory Visit: Payer: Self-pay | Admitting: Family Medicine

## 2020-06-23 DIAGNOSIS — Z1211 Encounter for screening for malignant neoplasm of colon: Secondary | ICD-10-CM | POA: Diagnosis not present

## 2020-06-23 DIAGNOSIS — Z1159 Encounter for screening for other viral diseases: Secondary | ICD-10-CM

## 2020-06-23 DIAGNOSIS — Z6841 Body Mass Index (BMI) 40.0 and over, adult: Secondary | ICD-10-CM | POA: Diagnosis not present

## 2020-06-23 DIAGNOSIS — I10 Essential (primary) hypertension: Secondary | ICD-10-CM | POA: Diagnosis not present

## 2020-06-23 DIAGNOSIS — Z23 Encounter for immunization: Secondary | ICD-10-CM

## 2020-06-23 DIAGNOSIS — R600 Localized edema: Secondary | ICD-10-CM

## 2020-06-23 DIAGNOSIS — E559 Vitamin D deficiency, unspecified: Secondary | ICD-10-CM | POA: Diagnosis not present

## 2020-06-23 DIAGNOSIS — Z86711 Personal history of pulmonary embolism: Secondary | ICD-10-CM

## 2020-06-23 DIAGNOSIS — Z0000 Encounter for general adult medical examination without abnormal findings: Secondary | ICD-10-CM

## 2020-06-23 DIAGNOSIS — E782 Mixed hyperlipidemia: Secondary | ICD-10-CM | POA: Diagnosis not present

## 2020-06-23 DIAGNOSIS — I340 Nonrheumatic mitral (valve) insufficiency: Secondary | ICD-10-CM | POA: Insufficient documentation

## 2020-06-23 DIAGNOSIS — I499 Cardiac arrhythmia, unspecified: Secondary | ICD-10-CM | POA: Insufficient documentation

## 2020-06-23 DIAGNOSIS — R011 Cardiac murmur, unspecified: Secondary | ICD-10-CM | POA: Diagnosis not present

## 2020-06-23 DIAGNOSIS — I2699 Other pulmonary embolism without acute cor pulmonale: Secondary | ICD-10-CM | POA: Diagnosis not present

## 2020-06-23 LAB — MICROALBUMIN, URINE WAIVED
Creatinine, Urine Waived: 50 mg/dL (ref 10–300)
Microalb, Ur Waived: 10 mg/L (ref 0–19)
Microalb/Creat Ratio: <30 mg/g (ref <30)

## 2020-06-23 MED ORDER — ELIQUIS 5 MG PO TABS
1.0000 | ORAL_TABLET | Freq: Two times a day (BID) | ORAL | 3 refills | Status: DC
Start: 2020-06-23 — End: 2020-11-01

## 2020-06-23 MED ORDER — FUROSEMIDE 20 MG PO TABS
ORAL_TABLET | ORAL | 2 refills | Status: DC
Start: 2020-06-23 — End: 2020-11-13

## 2020-06-23 NOTE — Assessment & Plan Note (Signed)
Noted on exam -- grade 2/6 systolic.  Will obtain echo to further assess.  At this time has baseline SOB with no worsening, no other symptoms present.  Referral to cardiology in place.

## 2020-06-23 NOTE — Assessment & Plan Note (Signed)
Noted on exam today, but EKG with NSR -- ?PVC or PAC.  Concern due to past history of Covid and risk factors + at baseline has SOB.  Will obtain echo and place referral to cardiology for further assessment.  May benefit from longer term monitor to further assess rhythm.  Return in 6 weeks.

## 2020-06-23 NOTE — Progress Notes (Signed)
BP 130/84   Pulse 89   Temp 98 F (36.7 C) (Oral)   Ht 5\' 4"  (1.626 m)   Wt (!) 300 lb 6.4 oz (136.3 kg)   LMP  (LMP Unknown)   SpO2 96%   BMI 51.56 kg/m    Subjective:    Patient ID: Jasmine Webb, female    DOB: [birth year], 65 y.o.   MRN: [id number]  HPI: Jasmine Webb is a 65 y.o. female presenting on 06/23/2020 for comprehensive medical examination. Current medical complaints include:none  She currently lives with: self Menopausal Symptoms: no   HYPERTENSION / HYPERLIPIDEMIA Continues on Losartan 100 MG and Atorvastatin 40 MG daily.  Last saw vascular, Dr. Lucky Cowboy, on 11/29/2019 and is to return PRN.  Is to continue on Eliquis for one year due to PE with Covid -- will stop 10/09/2020.  She does have compression hose and is wearing them daily.  Recently treated for cellulitis to left lower leg on 06/07/20 -- she is concerned for gout as noticed this presented after eating lots of red meat and has had similar issues in past with seafood.   Satisfied with current treatment? yes Duration of hypertension: chronic BP monitoring frequency: not checking BP range:  BP medication side effects: no Duration of hyperlipidemia: chronic Cholesterol medication side effects: no Cholesterol supplements: none Medication compliance: good compliance Aspirin: no Recent stressors: no Recurrent headaches: no Visual changes: no Palpitations: no Dyspnea: at baseline Chest pain: no Lower extremity edema: at baseline, no worsening Dizzy/lightheaded: only when upset and anxious  Depression Screen done today and results listed below:  Depression screen [facility] 2/9 06/23/2020 01/13/2020 05/22/2019 04/16/2019  Decreased Interest 0 0 0 0  Down, Depressed, Hopeless 0 0 0 0  PHQ - 2 Score 0 0 0 0  Altered sleeping 0 - - -  Tired, decreased energy 0 - - -  Change in appetite 0 - - -  Feeling bad or failure about yourself  0 - - -  Trouble concentrating 0 - - -  Moving slowly or fidgety/restless 0 - - -   Suicidal thoughts 0 - - -  PHQ-9 Score 0 - - -  Difficult doing work/chores Not difficult at all - - -    The patient does not have a history of falls. I did not complete a risk assessment for falls. A plan of care for falls was not documented.   Past Medical History:  Past Medical History:  Diagnosis Date  . Arthritis   . Tumor    breast and pelvic    Surgical History:  Past Surgical History:  Procedure Laterality Date  . ABDOMINAL HYSTERECTOMY  2003  . BREAST SURGERY    . PULMONARY THROMBECTOMY N/A 10/10/2019   Procedure: PULMONARY THROMBECTOMY;  Surgeon: Algernon Huxley, MD;  Location: [place] [facility];  Service: Cardiovascular;  Laterality: N/A;    Medications:  Current Outpatient Medications on File Prior to Visit  Medication Sig  . acetaminophen (TYLENOL) 500 MG tablet Take 500 mg by mouth every 6 (six) hours as needed.  . Ascorbic Acid (VITAMIN C) 500 MG CAPS Take by mouth.  Marland Kitchen atorvastatin (LIPITOR) 40 MG tablet Take 1 tablet (40 mg total) by mouth daily.  Marland Kitchen FIBER PO Take by mouth daily.  . fluticasone (FLONASE) 50 MCG/ACT nasal spray Place 2 sprays into both nostrils daily.  Marland Kitchen levocetirizine (XYZAL) 5 MG tablet Take 1 tablet (5 mg total) by mouth every evening.  Marland Kitchen losartan (COZAAR) 100 MG  tablet Take 1 tablet (100 mg total) by mouth daily.  Marland Kitchen VITAMIN D PO Take 1,000 Units by mouth daily.    No current facility-administered medications on file prior to visit.    Allergies:  Allergies  Allergen Reactions  . Ibuprofen     Told to avoid ibuprofen related products by doctor.    Social History:  Social History   Socioeconomic History  . Marital status: Divorced    Spouse name: Not on file  . Number of children: Not on file  . Years of education: Not on file  . Highest education level: Not on file  Occupational History  . Not on file  Tobacco Use  . Smoking status: Never Smoker  . Smokeless tobacco: Never Used  Vaping Use  . Vaping Use: Never used   Substance and Sexual Activity  . Alcohol use: Never  . Drug use: Never  . Sexual activity: Not Currently  Other Topics Concern  . Not on file  Social History Narrative  . Not on file   Social Determinants of Health   Financial Resource Strain: Low Risk   . Difficulty of Paying Living Expenses: Not hard at all  Food Insecurity: No Food Insecurity  . Worried About Charity fundraiser in the Last Year: Never true  . Ran Out of Food in the Last Year: Never true  Transportation Needs: No Transportation Needs  . Lack of Transportation (Medical): No  . Lack of Transportation (Non-Medical): No  Physical Activity: Inactive  . Days of Exercise per Week: 0 days  . Minutes of Exercise per Session: 0 min  Stress: No Stress Concern Present  . Feeling of Stress : Not at all  Social Connections: Not on file  Intimate Partner Violence: Not on file   Social History   Tobacco Use  Smoking Status Never Smoker  Smokeless Tobacco Never Used   Social History   Substance and Sexual Activity  Alcohol Use Never    Family History:  Family History  Problem Relation Age of Onset  . COPD Mother   . ALS Father   . Heart disease Sister   . Diabetes Brother   . Hypertension Brother   . Asthma Son   . Breast cancer Sister   . Colon cancer Sister   . Sinusitis Son     Past medical history, surgical history, medications, allergies, family history and social history reviewed with patient today and changes made to appropriate areas of the chart.   Review of Systems - negative All other ROS negative except what is listed above and in the HPI.      Objective:    BP 130/84   Pulse 89   Temp 98 F (36.7 C) (Oral)   Ht 5\' 4"  (1.626 m)   Wt (!) 300 lb 6.4 oz (136.3 kg)   LMP  (LMP Unknown)   SpO2 96%   BMI 51.56 kg/m   Wt Readings from Last 3 Encounters:  06/23/20 (!) 300 lb 6.4 oz (136.3 kg)  06/07/20 300 lb (136.1 kg)  01/13/20 290 lb (131.5 kg)    Physical Exam Vitals and  nursing note reviewed. Exam conducted with a chaperone present.  Constitutional:      General: She is awake. She is not in acute distress.    Appearance: She is well-developed. She is morbidly obese. She is not ill-appearing.  HENT:     Head: Normocephalic and atraumatic.     Right Ear: Hearing, tympanic membrane, ear  canal and external ear normal. No drainage.     Left Ear: Hearing, tympanic membrane, ear canal and external ear normal. No drainage.     Nose: Nose normal.     Right Sinus: No maxillary sinus tenderness or frontal sinus tenderness.     Left Sinus: No maxillary sinus tenderness or frontal sinus tenderness.     Mouth/Throat:     Mouth: Mucous membranes are moist.     Pharynx: Oropharynx is clear. Uvula midline. No pharyngeal swelling, oropharyngeal exudate or posterior oropharyngeal erythema.  Eyes:     General: Lids are normal.        Right eye: No discharge.        Left eye: No discharge.     Extraocular Movements: Extraocular movements intact.     Conjunctiva/sclera: Conjunctivae normal.     Pupils: Pupils are equal, round, and reactive to light.     Visual Fields: Right eye visual fields normal and left eye visual fields normal.  Neck:     Thyroid: No thyromegaly.     Vascular: No carotid bruit.     Trachea: Trachea normal.  Cardiovascular:     Rate and Rhythm: Normal rate. Rhythm irregular.     Heart sounds: Murmur heard.   Systolic murmur is present with a grade of 2/6. No gallop.   Pulmonary:     Effort: Pulmonary effort is normal. No accessory muscle usage or respiratory distress.     Breath sounds: Normal breath sounds.  Chest:  Breasts:     Right: Normal. No axillary adenopathy or supraclavicular adenopathy.     Left: Normal. No axillary adenopathy or supraclavicular adenopathy.    Abdominal:     General: Bowel sounds are normal.     Palpations: Abdomen is soft. There is no hepatomegaly or splenomegaly.     Tenderness: There is no abdominal  tenderness.  Musculoskeletal:        General: Normal range of motion.     Cervical back: Normal range of motion and neck supple.     Right lower leg: 2+ Edema present.     Left lower leg: 2+ Edema present.  Lymphadenopathy:     Head:     Right side of head: No submental, submandibular, tonsillar, preauricular or posterior auricular adenopathy.     Left side of head: No submental, submandibular, tonsillar, preauricular or posterior auricular adenopathy.     Cervical: No cervical adenopathy.     Upper Body:     Right upper body: No supraclavicular, axillary or pectoral adenopathy.     Left upper body: No supraclavicular, axillary or pectoral adenopathy.  Skin:    General: Skin is warm and dry.     Capillary Refill: Capillary refill takes less than 2 seconds.     Findings: No rash.  Neurological:     Mental Status: She is alert and oriented to person, place, and time.     Cranial Nerves: Cranial nerves are intact.     Gait: Gait is intact.     Deep Tendon Reflexes: Reflexes are normal and symmetric.     Reflex Scores:      Brachioradialis reflexes are 2+ on the right side and 2+ on the left side.      Patellar reflexes are 2+ on the right side and 2+ on the left side. Psychiatric:        Attention and Perception: Attention normal.        Mood and Affect: Mood normal.  Speech: Speech normal.        Behavior: Behavior normal. Behavior is cooperative.        Thought Content: Thought content normal.        Judgment: Judgment normal.    EKG My review and personal interpretation at Time: 1000 Indication: irregular hr Rate: 98  Rhythm: sinus Axis: normal Other: no st abn, no stemi  Results for orders placed or performed in visit on 06/23/20  Microalbumin, Urine Waived  Result Value Ref Range   Microalb, Ur Waived 10 0 - 19 mg/L   Creatinine, Urine Waived 50 10 - 300 mg/dL   Microalb/Creat Ratio <30 <30 mg/g      Assessment & Plan:   Problem List Items Addressed This  Visit      Cardiovascular and Mediastinum   Essential hypertension    Ongoing with BP at goal today on recheck.  Recommend she monitor BP at home regularly and document.  Will continue Losartan 100 MG daily and Lasix 20 MG to take as needed for edema.  CMP, CBC, TSH today. Would avoid Amlodipine due to baseline edema to BLE (suspect some lymphedema due to weight). Focus on DASH diet at home.  Return in 6 weeks.  Recommend: - Reminded to call for an overnight weight gain of >2 pounds or a weekly weight weight of >5 pounds - not adding salt to his food and has been reading food labels. Reviewed the importance of keeping daily sodium intake to 2000mg  daily       Relevant Medications   apixaban (ELIQUIS) 5 MG TABS tablet   furosemide (LASIX) 20 MG tablet   Other Relevant Orders   Comprehensive metabolic panel   TSH   Uric acid   Microalbumin, Urine Waived (Completed)     Other   Preventative health care    FOBT ordered today and tetanus updated.  She refuses mammogram or pneumococcal vaccine.  Agrees to DEXA, which was ordered.  Does not have uterus, no further paps.      BMI 50.0-59.9, adult (HCC)    Recommended eating smaller high protein, low fat meals more frequently and exercising 30 mins a day 5 times a week with a goal of 10-15lb weight loss in the next 3 months. Patient voiced their understanding and motivation to adhere to these recommendations.       Hyperlipidemia    Chronic, ongoing.  Continue current medication regimen and adjust as needed, Atorvastatin 40 MG.  Lipid panel fasting today.      Relevant Medications   apixaban (ELIQUIS) 5 MG TABS tablet   furosemide (LASIX) 20 MG tablet   Other Relevant Orders   Lipid Panel w/o Chol/HDL Ratio   Vitamin D deficiency    Chronic, stable.  Recommend she continue supplement Vitamin D3 1000 units daily for bone health.  Recheck level today.      Relevant Orders   VITAMIN D 25 Hydroxy (Vit-D Deficiency, Fractures)   Morbid  obesity due to excess calories (HCC) - Primary    BMI 51.56, has gained some weight back post Covid.  Recommended eating smaller high protein, low fat meals more frequently and exercising 30 mins a day 5 times a week with a goal of 10-15lb weight loss in the next 3 months. Patient voiced their understanding and motivation to adhere to these recommendations.       History of pulmonary embolus (PE)    With Covid in September 2021.  At this time will continue Eliquis and  collaboration with vascular with plan to discontinue Eliquis at one year mark and monitor.  Refills sent in.  CBC today.      Relevant Orders   CBC with Differential/Platelet   Bilateral lower extremity edema    Suspect some underlying lymphedema due to obesity.  Will continue compression hose to wear daily, on during day and off at night -- is offering benefit. Continue collaboration with vascular, may need compression pumps in future.  Lasix as needed for worsening.  Check uric acid level due to patient concerns for gout.      Irregular heartbeat    Noted on exam today, but EKG with NSR -- ?PVC or PAC.  Concern due to past history of Covid and risk factors + at baseline has SOB.  Will obtain echo and place referral to cardiology for further assessment.  May benefit from longer term monitor to further assess rhythm.  Return in 6 weeks.      Relevant Orders   EKG 12-Lead (Completed)   Ambulatory referral to Cardiology   ECHOCARDIOGRAM COMPLETE   Heart murmur, systolic    Noted on exam -- grade 2/6 systolic.  Will obtain echo to further assess.  At this time has baseline SOB with no worsening, no other symptoms present.  Referral to cardiology in place.       Other Visit Diagnoses    Need for Tdap vaccination       Tdap ordered and provided today   Relevant Orders   Tdap vaccine greater than or equal to 7yo IM (Completed)   Colon cancer screening       FOBT ordered today.   Relevant Orders   Fecal occult blood,  imunochemical   Need for hepatitis C screening test       Hep C x 1 on labs today for screening per guidelines, discussed with patient.   Relevant Orders   Hepatitis C antibody   Annual physical exam       Annual labs obtained today.  Health maintenance reviewed, she refuses mammogram and pneumococcal vaccines.       Follow up plan: Return in about 6 weeks (around 08/04/2020) for Heart check.   LABORATORY TESTING:  - Pap smear: not applicable  IMMUNIZATIONS:   - Tdap: Tetanus vaccination status reviewed: today given - Influenza: refused - Pneumovax: Refused - Prevnar: Refused - HPV: Not applicable - Zostavax vaccine: Refused  SCREENING: -Mammogram: Refuses - Colonoscopy: FIT test negative -- repeat ordered - Bone Density: Ordered today  -Hearing Test: Not applicable  -Spirometry: Not applicable   PATIENT COUNSELING:   Advised to take 1 mg of folate supplement per day if capable of pregnancy.   Sexuality: Discussed sexually transmitted diseases, partner selection, use of condoms, avoidance of unintended pregnancy  and contraceptive alternatives.   Advised to avoid cigarette smoking.  I discussed with the patient that most people either abstain from alcohol or drink within safe limits (<=14/week and <=4 drinks/occasion for males, <=7/weeks and <= 3 drinks/occasion for females) and that the risk for alcohol disorders and other health effects rises proportionally with the number of drinks per week and how often a drinker exceeds daily limits.  Discussed cessation/primary prevention of drug use and availability of treatment for abuse.   Diet: Encouraged to adjust caloric intake to maintain  or achieve ideal body weight, to reduce intake of dietary saturated fat and total fat, to limit sodium intake by avoiding high sodium foods and not adding table salt, and to  maintain adequate dietary potassium and calcium preferably from fresh fruits, vegetables, and low-fat dairy products.     Stressed the importance of regular exercise  Injury prevention: Discussed safety belts, safety helmets, smoke detector, smoking near bedding or upholstery.   Dental health: Discussed importance of regular tooth brushing, flossing, and dental visits.    NEXT PREVENTATIVE PHYSICAL DUE IN 1 YEAR. Return in about 6 weeks (around 08/04/2020) for Heart check.

## 2020-06-23 NOTE — Patient Instructions (Signed)
[facility] at [facility]  Address: [address], [place], [place] [address]  Phone: [phone]   Bone Density Test A bone density test uses a type of X-ray to measure the amount of calcium and other minerals in a person's bones. It can measure bone density in the hip and the spine. The test is similar to having a regular X-ray. This test may also be called:  Bone densitometry.  Bone mineral density test.  Dual-energy X-ray absorptiometry (DEXA). You may have this test to:  Diagnose a condition that causes weak or thin bones (osteoporosis).  Screen you for osteoporosis.  Predict your risk for a broken bone (fracture).  Determine how well your osteoporosis treatment is working. Tell a health care provider about:  Any allergies you have.  All medicines you are taking, including vitamins, herbs, eye drops, creams, and over-the-counter medicines.  Any problems you or family members have had with anesthetic medicines.  Any blood disorders you have.  Any surgeries you have had.  Any medical conditions you have.  Whether you are pregnant or may be pregnant.  Any medical tests you have had within the past 14 days that used contrast material. What are the risks? Generally, this is a safe test. However, it does expose you to a small amount of radiation, which can slightly increase your cancer risk. What happens before the test?  Do not take any calcium supplements within the 24 hours before your test.  You will need to remove all metal jewelry, eyeglasses, removable dental appliances, and any other metal objects on your body. What happens during the test?  You will lie down on an exam table. There will be an X-ray generator below you and an imaging device above you.  Other devices, such as boxes or braces, may be used to position your body properly for the scan.  The machine will slowly scan your body. You will need to keep very still while the  machine does the scan.  The images will show up on a screen in the room. Images will be examined by a specialist after your test is finished. The procedure may vary among health care providers and hospitals.   What can I expect after the test? It is up to you to get the results of your test. Ask your health care provider, or the department that is doing the test, when your results will be ready. Summary  A bone density test is an imaging test that uses a type of X-ray to measure the amount of calcium and other minerals in your bones.  The test may be used to diagnose or screen you for a condition that causes weak or thin bones (osteoporosis), predict your risk for a broken bone (fracture), or determine how well your osteoporosis treatment is working.  Do not take any calcium supplements within 24 hours before your test.  Ask your health care provider, or the department that is doing the test, when your results will be ready. This information is not intended to replace advice given to you by your health care provider. Make sure you discuss any questions you have with your health care provider. Document Revised: 07/04/2019 Document Reviewed: 07/04/2019 Elsevier Patient Education  [place].

## 2020-06-23 NOTE — Assessment & Plan Note (Signed)
Chronic, stable.  Recommend she continue supplement Vitamin D3 1000 units daily for bone health.  Recheck level today.

## 2020-06-23 NOTE — Telephone Encounter (Signed)
Requested Prescriptions  Pending Prescriptions Disp Refills  . levocetirizine (XYZAL) 5 MG tablet [Pharmacy Med Name: LEVOCETIRIZINE 5 MG TABLET] 90 tablet 1    Sig: TAKE 1 TABLET BY MOUTH EVERY DAY IN THE EVENING     Ear, Nose, and Throat:  Antihistamines Passed - 06/23/2020  5:31 PM      Passed - Valid encounter within last 12 months    Recent Outpatient Visits          Today Morbid obesity due to excess calories ([place])   [place], Strandquist T, NP   2 months ago Seasonal allergic rhinitis due to pollen   [facility], Megan P, DO   6 months ago Bilateral pulmonary embolism ([place])   [place] Cannady, Jolene T, NP   7 months ago Acute hypoxemic respiratory failure due to COVID-19 [facility])   [place] Cannady, Jolene T, NP   10 months ago Morbid obesity due to excess calories ([place])   [place], Barbaraann Faster, NP      Future Appointments            In 1 month Cannady, Barbaraann Faster, NP [facility]   In 6 months  [facility]

## 2020-06-23 NOTE — Assessment & Plan Note (Signed)
Recommended eating smaller high protein, low fat meals more frequently and exercising 30 mins a day 5 times a week with a goal of 10-15lb weight loss in the next 3 months. Patient voiced their understanding and motivation to adhere to these recommendations.  

## 2020-06-23 NOTE — Assessment & Plan Note (Signed)
Ongoing with BP at goal today on recheck.  Recommend she monitor BP at home regularly and document.  Will continue Losartan 100 MG daily and Lasix 20 MG to take as needed for edema.  CMP, CBC, TSH today. Would avoid Amlodipine due to baseline edema to BLE (suspect some lymphedema due to weight). Focus on DASH diet at home.  Return in 6 weeks.  Recommend: - Reminded to call for an overnight weight gain of >2 pounds or a weekly weight weight of >5 pounds - not adding salt to his food and has been reading food labels. Reviewed the importance of keeping daily sodium intake to 2000mg  daily

## 2020-06-23 NOTE — Assessment & Plan Note (Signed)
With Covid in September 2021.  At this time will continue Eliquis and collaboration with vascular with plan to discontinue Eliquis at one year mark and monitor.  Refills sent in.  CBC today.

## 2020-06-23 NOTE — Assessment & Plan Note (Signed)
FOBT ordered today and tetanus updated.  She refuses mammogram or pneumococcal vaccine.  Agrees to DEXA, which was ordered.  Does not have uterus, no further paps.

## 2020-06-23 NOTE — Assessment & Plan Note (Addendum)
Suspect some underlying lymphedema due to obesity.  Will continue compression hose to wear daily, on during day and off at night -- is offering benefit. Continue collaboration with vascular, may need compression pumps in future.  Lasix as needed for worsening.  Check uric acid level due to patient concerns for gout.

## 2020-06-23 NOTE — Assessment & Plan Note (Signed)
Chronic, ongoing.  Continue current medication regimen and adjust as needed, Atorvastatin 40 MG.  Lipid panel fasting today.

## 2020-06-23 NOTE — Assessment & Plan Note (Signed)
BMI 51.56, has gained some weight back post Covid.  Recommended eating smaller high protein, low fat meals more frequently and exercising 30 mins a day 5 times a week with a goal of 10-15lb weight loss in the next 3 months. Patient voiced their understanding and motivation to adhere to these recommendations.

## 2020-06-24 LAB — COMPREHENSIVE METABOLIC PANEL
ALT: 15 IU/L (ref 0–32)
AST: 16 IU/L (ref 0–40)
Albumin/Globulin Ratio: 2.3 — ABNORMAL HIGH (ref 1.2–2.2)
Albumin: 4.5 g/dL (ref 3.8–4.8)
Alkaline Phosphatase: 83 IU/L (ref 44–121)
BUN/Creatinine Ratio: 24 (ref 12–28)
BUN: 15 mg/dL (ref 8–27)
Bilirubin Total: 1.3 mg/dL — ABNORMAL HIGH (ref 0.0–1.2)
CO2: 24 mmol/L (ref 20–29)
Calcium: 9.4 mg/dL (ref 8.7–10.3)
Chloride: 101 mmol/L (ref 96–106)
Creatinine, Ser: 0.62 mg/dL (ref 0.57–1.00)
Globulin, Total: 2 g/dL (ref 1.5–4.5)
Glucose: 95 mg/dL (ref 65–99)
Potassium: 3.9 mmol/L (ref 3.5–5.2)
Sodium: 141 mmol/L (ref 134–144)
Total Protein: 6.5 g/dL (ref 6.0–8.5)
eGFR: 98 mL/min/{1.73_m2} (ref >59)

## 2020-06-24 LAB — CBC WITH DIFFERENTIAL/PLATELET
Basophils Absolute: 0 10*3/uL (ref 0.0–0.2)
Basos: 0 %
EOS (ABSOLUTE): 0.1 10*3/uL (ref 0.0–0.4)
Eos: 2 %
Hematocrit: 42.1 % (ref 34.0–46.6)
Hemoglobin: 13.8 g/dL (ref 11.1–15.9)
Immature Grans (Abs): 0 10*3/uL (ref 0.0–0.1)
Immature Granulocytes: 1 %
Lymphocytes Absolute: 1.4 10*3/uL (ref 0.7–3.1)
Lymphs: 23 %
MCH: 28.2 pg (ref 26.6–33.0)
MCHC: 32.8 g/dL (ref 31.5–35.7)
MCV: 86 fL (ref 79–97)
Monocytes Absolute: 0.3 10*3/uL (ref 0.1–0.9)
Monocytes: 6 %
Neutrophils Absolute: 4.3 10*3/uL (ref 1.4–7.0)
Neutrophils: 68 %
Platelets: 189 10*3/uL (ref 150–450)
RBC: 4.9 x10E6/uL (ref 3.77–5.28)
RDW: 13.2 % (ref 11.7–15.4)
WBC: 6.1 10*3/uL (ref 3.4–10.8)

## 2020-06-24 LAB — URIC ACID: Uric Acid: 4.4 mg/dL (ref 3.0–7.2)

## 2020-06-24 LAB — TSH: TSH: 3.14 u[IU]/mL (ref 0.450–4.500)

## 2020-06-24 LAB — VITAMIN D 25 HYDROXY (VIT D DEFICIENCY, FRACTURES): Vit D, 25-Hydroxy: 46.4 ng/mL (ref 30.0–100.0)

## 2020-06-24 LAB — LIPID PANEL W/O CHOL/HDL RATIO
Cholesterol, Total: 138 mg/dL (ref 100–199)
HDL: 50 mg/dL (ref >39)
LDL Chol Calc (NIH): 67 mg/dL (ref 0–99)
Triglycerides: 114 mg/dL (ref 0–149)
VLDL Cholesterol Cal: 21 mg/dL (ref 5–40)

## 2020-06-24 LAB — HEPATITIS C ANTIBODY: Hep C Virus Ab: <0.1 s/co ratio (ref 0.0–0.9)

## 2020-06-24 NOTE — Progress Notes (Signed)
Good morning, please let Ms. Manz know her labs have returned.  Overall they are nice and normal with LDL at goal on cholesterol, continue that Atorvastatin.  Thyroid and Vitamin D levels are normal.  Kidney and liver function are normal. Uric acid level is normal, no elevations.  Hep C returned negative.  Ensure to schedule the heart doctor appointment when they call, if you do not hear from them over next week, let our office know.  Any questions? Keep being awesome!!  Thank you for allowing me to participate in your care.  I appreciate you. Kindest regards, Jaivon Vanbeek

## 2020-06-26 ENCOUNTER — Ambulatory Visit: Payer: Medicare Other | Admitting: Cardiovascular Disease

## 2020-06-26 ENCOUNTER — Encounter: Payer: Self-pay | Admitting: Cardiovascular Disease

## 2020-06-26 ENCOUNTER — Ambulatory Visit (INDEPENDENT_AMBULATORY_CARE_PROVIDER_SITE_OTHER): Payer: Medicare Other

## 2020-06-26 ENCOUNTER — Other Ambulatory Visit: Payer: Self-pay

## 2020-06-26 VITALS — BP 160/80 | HR 77 | Ht 64.0 in | Wt 302.2 lb

## 2020-06-26 DIAGNOSIS — R002 Palpitations: Secondary | ICD-10-CM

## 2020-06-26 DIAGNOSIS — E785 Hyperlipidemia, unspecified: Secondary | ICD-10-CM | POA: Diagnosis not present

## 2020-06-26 DIAGNOSIS — I10 Essential (primary) hypertension: Secondary | ICD-10-CM | POA: Diagnosis not present

## 2020-06-26 DIAGNOSIS — R011 Cardiac murmur, unspecified: Secondary | ICD-10-CM

## 2020-06-26 NOTE — Progress Notes (Signed)
Cardiology Office Note   Date:  06/26/2020   ID:  Jasmine Webb, DOB 11-Sep-1955, MRN [id number]  PCP:  Venita Lick, NP  Cardiologist:   Kathlyn Sacramento, MD   Chief Complaint  Patient presents with  . New Patient (Initial Visit)    Ref by Marnee Guarneri, NP for irregular heartbeats. Medications reviewed by the patient verbally. Patient c/o shortness of breath, leg weakness, LE edema with whipping in legs, irregular heartbeats, nauseated at times.       History of Present Illness: Jasmine Webb is a 65 y.o. female who was referred by Bobbie Stack for evaluation of irregular heartbeats and a cardiac murmur. She has past medical history of essential hypertension, hyperlipidemia, obesity .  She was hospitalized in September of last year with shortness of breath and chest pain with mildly elevated troponin.  She tested positive for COVID.  CT angiogram showed sub-massive pulmonary embolism with right ventricular strain.  She underwent catheter-based thrombolysis with thrombectomy.  She was anticoagulated with Eliquis. Echocardiogram in September 2021 showed an EF of 55 to 40%, grade 2 diastolic dysfunction and normal RV function.  She recently started having palpitations with mild dizziness but no chest pain.  She reports exertional dyspnea with no recent worsening.  She reports bilateral leg edema.  She is not a smoker and has no prior cardiac history.  Past Medical History:  Diagnosis Date  . Arthritis   . Hyperlipidemia   . Hypertension   . Tumor    breast and pelvic    Past Surgical History:  Procedure Laterality Date  . ABDOMINAL HYSTERECTOMY  2003  . BREAST SURGERY    . PULMONARY THROMBECTOMY N/A 10/10/2019   Procedure: PULMONARY THROMBECTOMY;  Surgeon: Algernon Huxley, MD;  Location: [facility];  Service: Cardiovascular;  Laterality: N/A;     Current Outpatient Medications  Medication Sig Dispense Refill  . acetaminophen (TYLENOL) 500 MG tablet Take 500 mg  by mouth every 6 (six) hours as needed.    Marland Kitchen apixaban (ELIQUIS) 5 MG TABS tablet Take 1 tablet (5 mg total) by mouth 2 (two) times daily. 60 tablet 3  . Ascorbic Acid (VITAMIN C) 500 MG CAPS Take by mouth.    Marland Kitchen atorvastatin (LIPITOR) 40 MG tablet Take 1 tablet (40 mg total) by mouth daily. 90 tablet 4  . FIBER PO Take by mouth daily.    . fluticasone (FLONASE) 50 MCG/ACT nasal spray Place 2 sprays into both nostrils daily.    . furosemide (LASIX) 20 MG tablet TAKE 1 TABLET BY MOUTH EVERY DAY AS NEEDED FOR EDEMA 90 tablet 2  . levocetirizine (XYZAL) 5 MG tablet TAKE 1 TABLET BY MOUTH EVERY DAY IN THE EVENING 90 tablet 1  . losartan (COZAAR) 100 MG tablet Take 1 tablet (100 mg total) by mouth daily. 90 tablet 4  . VITAMIN D PO Take 1,000 Units by mouth daily.      No current facility-administered medications for this visit.    Allergies:   Ibuprofen    Social History:  The patient  reports that she has never smoked. She has never used smokeless tobacco. She reports that she does not drink alcohol and does not use drugs.   Family History:  The patient's family history includes ALS in her father; Asthma in her son; Breast cancer in her sister; COPD in her mother; Colon cancer in her sister; Diabetes in her brother; Heart disease in her sister; Hypertension in her brother; Sinusitis  in her son.    ROS:  Please see the history of present illness.   Otherwise, review of systems are positive for none.   All other systems are reviewed and negative.    PHYSICAL EXAM: VS:  BP (!) 160/80 (BP Location: Right Arm, Patient Position: Sitting, Cuff Size: Large)   Pulse 77   Ht 5\' 4"  (1.626 m)   Wt (!) 302 lb 4 oz (137.1 kg)   LMP  (LMP Unknown)   SpO2 97%   BMI 51.88 kg/m  , BMI Body mass index is 51.88 kg/m. GEN: Well nourished, well developed, in no acute distress  HEENT: normal  Neck: no JVD, carotid bruits, or masses Cardiac: RRR; no  rubs, or gallops, mild bilateral leg edema.  2 out of 6  systolic murmur in the aortic area which is early peaking. Respiratory:  clear to auscultation bilaterally, normal work of breathing GI: soft, nontender, nondistended, + BS MS: no deformity or atrophy  Skin: warm and dry, no rash Neuro:  Strength and sensation are intact Psych: euthymic mood, full affect   EKG:  EKG is ordered today. The ekg ordered today demonstrates normal sinus rhythm with PACs.   Recent Labs: 10/13/2019: Magnesium 1.9 11/19/2019: BNP 25.3 06/23/2020: ALT 15; BUN 15; Creatinine, Ser 0.62; Hemoglobin 13.8; Platelets 189; Potassium 3.9; Sodium 141; TSH 3.140    Lipid Panel    Component Value Date/Time   CHOL 138 06/23/2020 1115   TRIG 114 06/23/2020 1115   HDL 50 06/23/2020 1115   CHOLHDL 3.9 10/10/2019 0851   VLDL 14 10/10/2019 0851   LDLCALC 67 06/23/2020 1115      Wt Readings from Last 3 Encounters:  06/26/20 (!) 302 lb 4 oz (137.1 kg)  06/23/20 (!) 300 lb 6.4 oz (136.3 kg)  06/07/20 300 lb (136.1 kg)        PAD Screen 06/26/2020  Previous PAD dx? No  Previous surgical procedure? No  Pain with walking? No  Feet/toe relief with dangling? No  Painful, non-healing ulcers? No  Extremities discolored? No      ASSESSMENT AND PLAN:  1.  Palpitations: Likely due to premature beats.  I requested a 1 week ZIO monitor.  Treatment with a beta-blocker can be considered for symptomatic relief depending on the burden.  2.  Cardiac murmur: Likely due to aortic sclerosis or mild stenosis.  Given associated dyspnea and leg edema, I requested an echocardiogram.  3.  Essential hypertension: Blood pressure is mildly elevated.  Additional medications might be needed.  4.  Hyperlipidemia: Currently on atorvastatin 40 mg daily.  5.  History of submassive pulmonary embolism in the setting of COVID infection in September 2021: I agree with 1 year long of anticoagulation.    Disposition:   FU with me as needed.  Signed,  Kathlyn Sacramento, MD  06/26/2020 10:11  AM    [facility]

## 2020-06-26 NOTE — Patient Instructions (Signed)
Medication Instructions:  Your physician recommends that you continue on your current medications as directed. Please refer to the Current Medication list given to you today.  *If you need a refill on your cardiac medications before your next appointment, please call your pharmacy*   Lab Work: None ordered If you have labs (blood work) drawn today and your tests are completely normal, you will receive your results only by: Marland Kitchen MyChart Message (if you have MyChart) OR . A paper copy in the mail If you have any lab test that is abnormal or we need to change your treatment, we will call you to review the results.   Testing/Procedures: Your physician has requested that you have an echocardiogram. Echocardiography is a painless test that uses sound waves to create images of your heart. It provides your doctor with information about the size and shape of your heart and how well your heart's chambers and valves are working. This procedure takes approximately one hour. There are no restrictions for this procedure.  Your physician has recommended that you wear a Zio monitor XT (To be worn for 7 days).   This monitor is a medical device that records the heart's electrical activity. Doctors most often use these monitors to diagnose arrhythmias. Arrhythmias are problems with the speed or rhythm of the heartbeat. The monitor is a small device applied to your chest. You can wear one while you do your normal daily activities. While wearing this monitor if you have any symptoms to push the button and record what you felt. Once you have worn this monitor for the period of time provider prescribed (Usually 14 days), you will return the monitor device in the postage paid box. Once it is returned they will download the data collected and provide [place] with a report which the provider will then review and we will call you with those results. Important tips:  1. Avoid showering during the first 24 hours of wearing the  monitor. 2. Avoid excessive sweating to help maximize wear time. 3. Do not submerge the device, no hot tubs, and no swimming pools. 4. Keep any lotions or oils away from the patch. 5. After 24 hours you may shower with the patch on. Take brief showers with your back facing the shower head.  6. Do not remove patch once it has been placed because that will interrupt data and decrease adhesive wear time. 7. Push the button when you have any symptoms and write down what you were feeling. 8. Once you have completed wearing your monitor, remove and place into box which has postage paid and place in your outgoing mailbox.  9. If for some reason you have misplaced your box then call our office and we can provide another box and/or mail it off for you.       Follow-Up: At [facility], you and your health needs are our priority.  As part of our continuing mission to provide you with exceptional heart care, we have created designated Provider Care Teams.  These Care Teams include your primary Cardiologist (physician) and Advanced Practice Providers (APPs -  Physician Assistants and Nurse Practitioners) who all work together to provide you with the care you need, when you need it.  We recommend signing up for the patient portal called "MyChart".  Sign up information is provided on this After Visit Summary.  MyChart is used to connect with patients for Virtual Visits (Telemedicine).  Patients are able to view lab/test results, encounter notes, upcoming appointments,  etc.  Non-urgent messages can be sent to your provider as well.   To learn more about what you can do with MyChart, go to [email or web address].    Your next appointment:   As needed  The format for your next appointment:   In Person  Provider:   You may see Kathlyn Sacramento, MD or one of the following Advanced Practice Providers on your designated Care Team:    Murray Hodgkins, NP  Christell Faith, PA-C  Marrianne Mood,  PA-C  Cadence Newhope, [place]  Laurann Montana, NP    Other Instructions N/A

## 2020-06-30 ENCOUNTER — Other Ambulatory Visit: Payer: Medicare Other

## 2020-06-30 ENCOUNTER — Other Ambulatory Visit: Payer: Self-pay

## 2020-06-30 DIAGNOSIS — Z1211 Encounter for screening for malignant neoplasm of colon: Secondary | ICD-10-CM | POA: Diagnosis not present

## 2020-07-01 LAB — FECAL OCCULT BLOOD, IMMUNOCHEMICAL: Fecal Occult Bld: NEGATIVE

## 2020-07-01 NOTE — Progress Notes (Signed)
Please let Jasmine Webb know her stool sample returned negative for blood:)

## 2020-07-02 DIAGNOSIS — R002 Palpitations: Secondary | ICD-10-CM

## 2020-07-06 DIAGNOSIS — R002 Palpitations: Secondary | ICD-10-CM | POA: Diagnosis not present

## 2020-07-21 ENCOUNTER — Telehealth: Payer: Self-pay

## 2020-07-21 MED ORDER — METOPROLOL TARTRATE 25 MG PO TABS: 25.0000 mg | ORAL_TABLET | Freq: Two times a day (BID) | ORAL | 1 refills | Status: DC

## 2020-07-21 NOTE — Telephone Encounter (Signed)
Patient made aware of zio monitor results and Dr. Tyrell Antonio recommendation. Patient is agreeable with starting metoprolol 25 mg bid. Rx sent to the patients pharmacy. Follow up appt scheduled with Dr. Fletcher Anon on 08/14/20 @ 9:20am with Dr. Fletcher Anon.

## 2020-07-21 NOTE — Telephone Encounter (Signed)
-----   Message from Wellington Hampshire, MD sent at 07/17/2020  1:11 PM EDT ----- Inform patient that monitor showed frequent short runs of supraventricular tachycardia.  Recommend adding metoprolol tartrate 25 mg twice daily.  Schedule follow-up visit after echocardiogram is done.

## 2020-07-22 ENCOUNTER — Telehealth: Payer: Self-pay | Admitting: Cardiovascular Disease

## 2020-07-22 NOTE — Telephone Encounter (Signed)
Spoke with the patient. Clarified and confirmed that the Rx for metoprolol tartrate 25 mg twice a day is correct.  Patient verbalized understanding and voiced appreciation for the call back.

## 2020-07-22 NOTE — Telephone Encounter (Signed)
Patient calling  Needs to clarify instructions for metoprolol  States what was said at visit and what is on bottle is different Please call to discuss

## 2020-08-03 ENCOUNTER — Encounter: Payer: Self-pay | Admitting: Nurse Practitioner

## 2020-08-03 DIAGNOSIS — I471 Supraventricular tachycardia, unspecified: Secondary | ICD-10-CM | POA: Insufficient documentation

## 2020-08-04 ENCOUNTER — Ambulatory Visit (INDEPENDENT_AMBULATORY_CARE_PROVIDER_SITE_OTHER): Payer: Medicare Other | Admitting: Nurse Practitioner

## 2020-08-04 ENCOUNTER — Encounter: Payer: Self-pay | Admitting: Nurse Practitioner

## 2020-08-04 ENCOUNTER — Other Ambulatory Visit: Payer: Self-pay

## 2020-08-04 VITALS — BP 128/75 | HR 66 | Temp 97.6°F | Wt 304.6 lb

## 2020-08-04 DIAGNOSIS — Z86711 Personal history of pulmonary embolism: Secondary | ICD-10-CM | POA: Diagnosis not present

## 2020-08-04 DIAGNOSIS — Z66 Do not resuscitate: Secondary | ICD-10-CM | POA: Diagnosis not present

## 2020-08-04 DIAGNOSIS — I10 Essential (primary) hypertension: Secondary | ICD-10-CM | POA: Diagnosis not present

## 2020-08-04 DIAGNOSIS — I471 Supraventricular tachycardia, unspecified: Secondary | ICD-10-CM

## 2020-08-04 NOTE — Assessment & Plan Note (Signed)
BMI 52.28, has gained some weight back post Covid.  Recommended eating smaller high protein, low fat meals more frequently and exercising 30 mins a day 5 times a week with a goal of 10-15lb weight loss in the next 3 months. Patient voiced their understanding and motivation to adhere to these recommendations.

## 2020-08-04 NOTE — Patient Instructions (Signed)
https://www.nhlbi.nih.gov/files/docs/public/heart/dash_brief.pdf">  DASH Eating Plan DASH stands for Dietary Approaches to Stop Hypertension. The DASH eating plan is a healthy eating plan that has been shown to: Reduce high blood pressure (hypertension). Reduce your risk for type 2 diabetes, heart disease, and stroke. Help with weight loss. What are tips for following this plan? Reading food labels Check food labels for the amount of salt (sodium) per serving. Choose foods with less than 5 percent of the Daily Value of sodium. Generally, foods with less than 300 milligrams (mg) of sodium per serving fit into this eating plan. To find whole grains, look for the word "whole" as the first word in the ingredient list. Shopping Buy products labeled as "low-sodium" or "no salt added." Buy fresh foods. Avoid canned foods and pre-made or frozen meals. Cooking Avoid adding salt when cooking. Use salt-free seasonings or herbs instead of table salt or sea salt. Check with your health care provider or pharmacist before using salt substitutes. Do not fry foods. Cook foods using healthy methods such as baking, boiling, grilling, roasting, and broiling instead. Cook with heart-healthy oils, such as olive, canola, avocado, soybean, or sunflower oil. Meal planning  Eat a balanced diet that includes: 4 or more servings of fruits and 4 or more servings of vegetables each day. Try to fill one-half of your plate with fruits and vegetables. 6-8 servings of whole grains each day. Less than 6 oz (170 g) of lean meat, poultry, or fish each day. A 3-oz (85-g) serving of meat is about the same size as a deck of cards. One egg equals 1 oz (28 g). 2-3 servings of low-fat dairy each day. One serving is 1 cup (237 mL). 1 serving of nuts, seeds, or beans 5 times each week. 2-3 servings of heart-healthy fats. Healthy fats called omega-3 fatty acids are found in foods such as walnuts, flaxseeds, fortified milks, and eggs.  These fats are also found in cold-water fish, such as sardines, salmon, and mackerel. Limit how much you eat of: Canned or prepackaged foods. Food that is high in trans fat, such as some fried foods. Food that is high in saturated fat, such as fatty meat. Desserts and other sweets, sugary drinks, and other foods with added sugar. Full-fat dairy products. Do not salt foods before eating. Do not eat more than 4 egg yolks a week. Try to eat at least 2 vegetarian meals a week. Eat more home-cooked food and less restaurant, buffet, and fast food.  Lifestyle When eating at a restaurant, ask that your food be prepared with less salt or no salt, if possible. If you drink alcohol: Limit how much you use to: 0-1 drink a day for women who are not pregnant. 0-2 drinks a day for men. Be aware of how much alcohol is in your drink. In the [place] one drink equals one 12 oz bottle of beer (355 mL), one 5 oz glass of wine (148 mL), or one 1 oz glass of hard liquor (44 mL). General information Avoid eating more than 2,300 mg of salt a day. If you have hypertension, you may need to reduce your sodium intake to 1,500 mg a day. Work with your health care provider to maintain a healthy body weight or to lose weight. Ask what an ideal weight is for you. Get at least 30 minutes of exercise that causes your heart to beat faster (aerobic exercise) most days of the week. Activities may include walking, swimming, or biking. Work with your health care provider   or dietitian to adjust your eating plan to your individual calorie needs. What foods should I eat? Fruits All fresh, dried, or frozen fruit. Canned fruit in natural juice (without addedsugar). Vegetables Fresh or frozen vegetables (raw, steamed, roasted, or grilled). Low-sodium or reduced-sodium tomato and vegetable juice. Low-sodium or reduced-sodium tomatosauce and tomato paste. Low-sodium or reduced-sodium canned vegetables. Grains Whole-grain or  whole-wheat bread. Whole-grain or whole-wheat pasta. Brown rice. Oatmeal. Quinoa. Bulgur. Whole-grain and low-sodium cereals. Pita bread.Low-fat, low-sodium crackers. Whole-wheat flour tortillas. Meats and other proteins Skinless chicken or turkey. Ground chicken or turkey. Pork with fat trimmed off. Fish and seafood. Egg whites. Dried beans, peas, or lentils. Unsalted nuts, nut butters, and seeds. Unsalted canned beans. Lean cuts of beef with fat trimmed off. Low-sodium, lean precooked or cured meat, such as sausages or meatloaves. Dairy Low-fat (1%) or fat-free (skim) milk. Reduced-fat, low-fat, or fat-free cheeses. Nonfat, low-sodium ricotta or cottage cheese. Low-fat or nonfatyogurt. Low-fat, low-sodium cheese. Fats and oils Soft margarine without trans fats. Vegetable oil. Reduced-fat, low-fat, or light mayonnaise and salad dressings (reduced-sodium). Canola, safflower, olive, avocado, soybean, andsunflower oils. Avocado. Seasonings and condiments Herbs. Spices. Seasoning mixes without salt. Other foods Unsalted popcorn and pretzels. Fat-free sweets. The items listed above may not be a complete list of foods and beverages you can eat. Contact a dietitian for more information. What foods should I avoid? Fruits Canned fruit in a light or heavy syrup. Fried fruit. Fruit in cream or buttersauce. Vegetables Creamed or fried vegetables. Vegetables in a cheese sauce. Regular canned vegetables (not low-sodium or reduced-sodium). Regular canned tomato sauce and paste (not low-sodium or reduced-sodium). Regular tomato and vegetable juice(not low-sodium or reduced-sodium). Pickles. Olives. Grains Baked goods made with fat, such as croissants, muffins, or some breads. Drypasta or rice meal packs. Meats and other proteins Fatty cuts of meat. Ribs. Fried meat. Bacon. Bologna, salami, and other precooked or cured meats, such as sausages or meat loaves. Fat from the back of a pig (fatback). Bratwurst.  Salted nuts and seeds. Canned beans with added salt. Canned orsmoked fish. Whole eggs or egg yolks. Chicken or turkey with skin. Dairy Whole or 2% milk, cream, and half-and-half. Whole or full-fat cream cheese. Whole-fat or sweetened yogurt. Full-fat cheese. Nondairy creamers. Whippedtoppings. Processed cheese and cheese spreads. Fats and oils Butter. Stick margarine. Lard. Shortening. Ghee. Bacon fat. Tropical oils, suchas coconut, palm kernel, or palm oil. Seasonings and condiments Onion salt, garlic salt, seasoned salt, table salt, and sea salt. Worcestershire sauce. Tartar sauce. Barbecue sauce. Teriyaki sauce. Soy sauce, including reduced-sodium. Steak sauce. Canned and packaged gravies. Fish sauce. Oyster sauce. Cocktail sauce. Store-bought horseradish. Ketchup. Mustard. Meat flavorings and tenderizers. Bouillon cubes. Hot sauces. Pre-made or packaged marinades. Pre-made or packaged taco seasonings. Relishes. Regular saladdressings. Other foods Salted popcorn and pretzels. The items listed above may not be a complete list of foods and beverages you should avoid. Contact a dietitian for more information. Where to find more information National Heart, Lung, and Blood Institute: www.nhlbi.nih.gov American Heart Association: www.heart.org Academy of Nutrition and Dietetics: www.eatright.org National Kidney Foundation: www.kidney.org Summary The DASH eating plan is a healthy eating plan that has been shown to reduce high blood pressure (hypertension). It may also reduce your risk for type 2 diabetes, heart disease, and stroke. When on the DASH eating plan, aim to eat more fresh fruits and vegetables, whole grains, lean proteins, low-fat dairy, and heart-healthy fats. With the DASH eating plan, you should limit salt (sodium) intake to 2,300   mg a day. If you have hypertension, you may need to reduce your sodium intake to 1,500 mg a day. Work with your health care provider or dietitian to adjust  your eating plan to your individual calorie needs. This information is not intended to replace advice given to you by your health care provider. Make sure you discuss any questions you have with your healthcare provider. Document Revised: 12/21/2018 Document Reviewed: 12/21/2018 Elsevier Patient Education  2022 Elsevier Inc.  

## 2020-08-04 NOTE — Assessment & Plan Note (Signed)
With Covid in September 2021.  At this time will continue Eliquis and collaboration with vascular with plan to discontinue Eliquis at one year mark and monitor.

## 2020-08-04 NOTE — Assessment & Plan Note (Signed)
A voluntary discussion about advance care planning including the explanation and discussion of advance directives was extensively discussed  with the patient for 15 minutes with patient.  Explanation about the health care proxy and Living will was reviewed and packet with forms with explanation of how to fill them out was given.  During this discussion, the patient was able to identify a health care proxy as one of her children (she will discuss with them) and plans to fill out the paperwork required.  Patient was offered a separate [place].

## 2020-08-04 NOTE — Progress Notes (Signed)
BP 128/75   Pulse 66   Temp 97.6 F (36.4 C) (Oral)   Wt (!) 304 lb 9.6 oz (138.2 kg)   LMP  (LMP Unknown)   SpO2 96%   BMI 52.28 kg/m    Subjective:    Patient ID: Jasmine Webb, female    DOB: [birth year], 65 y.o.   MRN: [id number]  HPI: Jasmine Webb is a 65 y.o. female  Chief Complaint  Patient presents with   Follow-up   Hypertension    Patient states she has been keeping up with her  BP readings at home and states that they have still be reading 130's and patient states she still feels tired, exhausted and still having flutters in the heart. Patient states she has been having numbness right side in arm and leg. Patient states when the numbness comes it is accompanied with an headache.    HYPERTENSION Currently taking Apixaban for PE history with Covid.  Taking Lasix 20 MG daily, Losartan 100 MG daily, Atorvastatin 40 MG daily.  Started on Metoprolol 25 MG BID by cardiology for SVT noted on Zio. Last saw cardiology 06/26/20.  She has imaging on Thursday with them, and then will have follow-up.  She reports no feeling as bad as she was, but continues to have exhaustion and flutters + occasional numbness with her heart.  Hypertension status: controlled  Satisfied with current treatment? yes Duration of hypertension: chronic BP monitoring frequency:  a few times a week BP range: 130/60-70 range BP medication side effects:  no Medication compliance: good compliance Previous BP meds:as above Aspirin: no Recurrent headaches: no Visual changes: no Palpitations: yes Dyspnea: yes Chest pain: no Lower extremity edema:  at baseline Dizzy/lightheaded: occasional  A voluntary discussion about advance care planning including the explanation and discussion of advance directives was extensively discussed  with the patient for 15 minutes with patient.  Explanation about the health care proxy and living will was reviewed and packet with forms with explanation of how to fill them out was  given.  During this discussion, the patient was able to identify a health care proxy as one child (she needs to discuss with them) and plans to fill out the paperwork required.  Patient was offered a separate Advance Care.  If her heart were to stop she does not want to be resuscitated.  Relevant past medical, surgical, family and social history reviewed and updated as indicated. Interim medical history since our last visit reviewed. Allergies and medications reviewed and updated.  Review of Systems  Constitutional:  Negative for activity change, appetite change, diaphoresis, fatigue and fever.  Respiratory:  Negative for cough, chest tightness and shortness of breath.   Cardiovascular:  Negative for chest pain, palpitations and leg swelling.  Gastrointestinal: Negative.   Endocrine: Negative for cold intolerance, heat intolerance, polydipsia, polyphagia and polyuria.  Neurological:  Negative for dizziness, syncope, weakness, light-headedness, numbness and headaches.  Psychiatric/Behavioral: Negative.     Per HPI unless specifically indicated above     Objective:    BP 128/75   Pulse 66   Temp 97.6 F (36.4 C) (Oral)   Wt (!) 304 lb 9.6 oz (138.2 kg)   LMP  (LMP Unknown)   SpO2 96%   BMI 52.28 kg/m   Wt Readings from Last 3 Encounters:  08/04/20 (!) 304 lb 9.6 oz (138.2 kg)  06/26/20 (!) 302 lb 4 oz (137.1 kg)  06/23/20 (!) 300 lb 6.4 oz (136.3 kg)    Physical Exam  Vitals and nursing note reviewed.  Constitutional:      General: She is awake. She is not in acute distress.    Appearance: She is well-developed and well-groomed. She is morbidly obese. She is not ill-appearing.  HENT:     Head: Normocephalic.     Right Ear: Hearing normal.     Left Ear: Hearing normal.  Eyes:     General: Lids are normal.        Right eye: No discharge.        Left eye: No discharge.     Conjunctiva/sclera: Conjunctivae normal.     Pupils: Pupils are equal, round, and reactive to light.   Neck:     Vascular: No carotid bruit.  Cardiovascular:     Rate and Rhythm: Normal rate and regular rhythm.     Heart sounds: Normal heart sounds. No murmur heard.   No gallop.     Comments: Baseline edema BLE Pulmonary:     Effort: Pulmonary effort is normal. No accessory muscle usage or respiratory distress.     Breath sounds: Normal breath sounds.  Abdominal:     General: Bowel sounds are normal.     Palpations: Abdomen is soft.  Musculoskeletal:     Cervical back: Normal range of motion and neck supple.     Right lower leg: 2+ Edema present.     Left lower leg: 2+ Edema present.  Skin:    General: Skin is warm and dry.  Neurological:     Mental Status: She is alert and oriented to person, place, and time.  Psychiatric:        Attention and Perception: Attention normal.        Mood and Affect: Mood normal.        Speech: Speech normal.        Behavior: Behavior normal. Behavior is cooperative.        Thought Content: Thought content normal.    Results for orders placed or performed in visit on 06/23/20  Fecal occult blood, imunochemical   Specimen: Stool   ST  Result Value Ref Range   Fecal Occult Bld Negative Negative  CBC with Differential/Platelet  Result Value Ref Range   WBC 6.1 3.4 - 10.8 x10E3/uL   RBC 4.90 3.77 - 5.28 x10E6/uL   Hemoglobin 13.8 11.1 - 15.9 g/dL   Hematocrit 89.5 01.1 - 46.6 %   MCV 86 79 - 97 fL   MCH 28.2 26.6 - 33.0 pg   MCHC 32.8 31.5 - 35.7 g/dL   RDW 56.7 16.4 - 08.9 %   Platelets 189 150 - 450 x10E3/uL   Neutrophils 68 Not Estab. %   Lymphs 23 Not Estab. %   Monocytes 6 Not Estab. %   Eos 2 Not Estab. %   Basos 0 Not Estab. %   Neutrophils Absolute 4.3 1.4 - 7.0 x10E3/uL   Lymphocytes Absolute 1.4 0.7 - 3.1 x10E3/uL   Monocytes Absolute 0.3 0.1 - 0.9 x10E3/uL   EOS (ABSOLUTE) 0.1 0.0 - 0.4 x10E3/uL   Basophils Absolute 0.0 0.0 - 0.2 x10E3/uL   Immature Granulocytes 1 Not Estab. %   Immature Grans (Abs) 0.0 0.0 - 0.1 x10E3/uL   Comprehensive metabolic panel  Result Value Ref Range   Glucose 95 65 - 99 mg/dL   BUN 15 8 - 27 mg/dL   Creatinine, Ser 0.97 0.57 - 1.00 mg/dL   eGFR 98 >52 PJ/DFB/7.14   BUN/Creatinine Ratio 24 12 - 28  Sodium 141 134 - 144 mmol/L   Potassium 3.9 3.5 - 5.2 mmol/L   Chloride 101 96 - 106 mmol/L   CO2 24 20 - 29 mmol/L   Calcium 9.4 8.7 - 10.3 mg/dL   Total Protein 6.5 6.0 - 8.5 g/dL   Albumin 4.5 3.8 - 4.8 g/dL   Globulin, Total 2.0 1.5 - 4.5 g/dL   Albumin/Globulin Ratio 2.3 (H) 1.2 - 2.2   Bilirubin Total 1.3 (H) 0.0 - 1.2 mg/dL   Alkaline Phosphatase 83 44 - 121 IU/L   AST 16 0 - 40 IU/L   ALT 15 0 - 32 IU/L  Lipid Panel w/o Chol/HDL Ratio  Result Value Ref Range   Cholesterol, Total 138 100 - 199 mg/dL   Triglycerides 114 0 - 149 mg/dL   HDL 50 >39 mg/dL   VLDL Cholesterol Cal 21 5 - 40 mg/dL   LDL Chol Calc (NIH) 67 0 - 99 mg/dL  TSH  Result Value Ref Range   TSH 3.140 0.450 - 4.500 uIU/mL  VITAMIN D 25 Hydroxy (Vit-D Deficiency, Fractures)  Result Value Ref Range   Vit D, 25-Hydroxy 46.4 30.0 - 100.0 ng/mL  Hepatitis C antibody  Result Value Ref Range   Hep C Virus Ab <0.1 0.0 - 0.9 s/co ratio  Uric acid  Result Value Ref Range   Uric Acid 4.4 3.0 - 7.2 mg/dL  Microalbumin, Urine Waived  Result Value Ref Range   Microalb, Ur Waived 10 0 - 19 mg/L   Creatinine, Urine Waived 50 10 - 300 mg/dL   Microalb/Creat Ratio <30 <30 mg/g      Assessment & Plan:   Problem List Items Addressed This Visit       Cardiovascular and Mediastinum   Essential hypertension    Ongoing with BP at goal today.  Recommend she monitor BP at home regularly and document.  Will continue Losartan 100 MG daily, Metoprolol, and Lasix 20 MG to take as needed for edema.   Would avoid Amlodipine due to baseline edema to BLE (suspect some lymphedema due to weight). Focus on DASH diet at home.  Return in 6 months.  Recommend: - Reminded to call for an overnight weight gain of >2 pounds or a  weekly weight weight of >5 pounds - not adding salt to his food and has been reading food labels. Reviewed the importance of keeping daily sodium intake to '2000mg'$  daily        SVT (supraventricular tachycardia) ([place]) - Primary    New diagnosis with cardiology, will continue Metoprolol as ordered by them.  Recent notes and testing reviewed.  She continue to have some symptoms, is scheduled for further testing Thursday.         Other   Morbid obesity due to excess calories (HCC)    BMI 52.28, has gained some weight back post Covid.  Recommended eating smaller high protein, low fat meals more frequently and exercising 30 mins a day 5 times a week with a goal of 10-15lb weight loss in the next 3 months. Patient voiced their understanding and motivation to adhere to these recommendations.        History of pulmonary embolus (PE)    With Covid in September 2021.  At this time will continue Eliquis and collaboration with vascular with plan to discontinue Eliquis at one year mark and monitor.         DNR (do not resuscitate)    A voluntary discussion about advance care planning  including the explanation and discussion of advance directives was extensively discussed  with the patient for 15 minutes with patient.  Explanation about the health care proxy and Living will was reviewed and packet with forms with explanation of how to fill them out was given.  During this discussion, the patient was able to identify a health care proxy as one of her children (she will discuss with them) and plans to fill out the paperwork required.  Patient was offered a separate [place].        Time: 25 minutes, >50% spent counseling/or care coordination for advanced care planning  Follow up plan: Return in about 6 months (around 02/04/2021) for HTN/HLD, PE, SVT, VIT D.

## 2020-08-04 NOTE — Assessment & Plan Note (Signed)
Ongoing with BP at goal today.  Recommend she monitor BP at home regularly and document.  Will continue Losartan 100 MG daily, Metoprolol, and Lasix 20 MG to take as needed for edema.   Would avoid Amlodipine due to baseline edema to BLE (suspect some lymphedema due to weight). Focus on DASH diet at home.  Return in 6 months.  Recommend: - Reminded to call for an overnight weight gain of >2 pounds or a weekly weight weight of >5 pounds - not adding salt to his food and has been reading food labels. Reviewed the importance of keeping daily sodium intake to 2000mg  daily

## 2020-08-04 NOTE — Assessment & Plan Note (Signed)
New diagnosis with cardiology, will continue Metoprolol as ordered by them.  Recent notes and testing reviewed.  She continue to have some symptoms, is scheduled for further testing Thursday.

## 2020-08-06 ENCOUNTER — Other Ambulatory Visit: Payer: Self-pay

## 2020-08-06 ENCOUNTER — Ambulatory Visit (INDEPENDENT_AMBULATORY_CARE_PROVIDER_SITE_OTHER): Payer: Medicare Other

## 2020-08-06 DIAGNOSIS — R011 Cardiac murmur, unspecified: Secondary | ICD-10-CM

## 2020-08-06 LAB — ECHOCARDIOGRAM COMPLETE
AR max vel: 2.75 cm2
AV Area VTI: 2.65 cm2
AV Area mean vel: 2.76 cm2
AV Mean grad: 8 mmHg
AV Peak grad: 15.1 mmHg
Ao pk vel: 1.94 m/s
Area-P 1/2: 2.83 cm2
MV VTI: 3.2 cm2

## 2020-08-06 MED ORDER — PERFLUTREN LIPID MICROSPHERE
1.0000 mL | INTRAVENOUS | Status: AC | PRN
  Administered 2020-08-06: 2 mL via INTRAVENOUS

## 2020-08-13 ENCOUNTER — Other Ambulatory Visit: Payer: Self-pay | Admitting: Cardiovascular Disease

## 2020-08-14 ENCOUNTER — Other Ambulatory Visit: Payer: Self-pay

## 2020-08-14 ENCOUNTER — Encounter: Payer: Self-pay | Admitting: Cardiovascular Disease

## 2020-08-14 ENCOUNTER — Ambulatory Visit: Payer: Medicare Other | Admitting: Cardiovascular Disease

## 2020-08-14 VITALS — BP 120/70 | HR 69 | Ht 63.0 in | Wt 305.5 lb

## 2020-08-14 DIAGNOSIS — I10 Essential (primary) hypertension: Secondary | ICD-10-CM | POA: Diagnosis not present

## 2020-08-14 DIAGNOSIS — I471 Supraventricular tachycardia, unspecified: Secondary | ICD-10-CM

## 2020-08-14 DIAGNOSIS — E785 Hyperlipidemia, unspecified: Secondary | ICD-10-CM

## 2020-08-14 DIAGNOSIS — Z86711 Personal history of pulmonary embolism: Secondary | ICD-10-CM

## 2020-08-14 DIAGNOSIS — R0600 Dyspnea, unspecified: Secondary | ICD-10-CM | POA: Diagnosis not present

## 2020-08-14 DIAGNOSIS — R0609 Other forms of dyspnea: Secondary | ICD-10-CM

## 2020-08-14 NOTE — Progress Notes (Signed)
Cardiology Office Note   Date:  08/14/2020   ID:  Jasmine Webb, DOB [birth year], MRN [id number]  PCP:  Venita Lick, NP  Cardiologist:   Kathlyn Sacramento, MD   Chief Complaint  Patient presents with   Other    Post Echo/SVT no complaints today. Meds reviewed verbally with pt.      History of Present Illness: Jasmine Webb is a 65 y.o. female who is here today for a follow-up visit regarding palpitations due to suspected PSVT.   She has past medical history of essential hypertension, pulmonary embolism, hyperlipidemia, obesity .  She was hospitalized in September of last year with shortness of breath and chest pain with mildly elevated troponin.  She tested positive for COVID.  CT angiogram showed sub-massive pulmonary embolism with right ventricular strain.  She underwent catheter-based thrombolysis with thrombectomy.  She was anticoagulated with Eliquis. Echocardiogram in September 2021 showed an EF of 55 to 63%, grade 2 diastolic dysfunction and normal RV function.  She was seen recently for intermittent palpitations with mild dizziness without chest pain.  She underwent a 7-day ZIO monitor which showed multiple short runs of SVT the longest lasted 10 minutes and 33 seconds with frequent PACs and rare PVCs.  Based on these results, she was started on metoprolol tartrate 25 mg twice daily.  Echocardiogram was performed which showed an EF of 60 to 84%, grade 1 diastolic dysfunction, borderline pulmonary hypertension with an estimated RSVP of 35 mmHg, mild mitral regurgitation and mild to moderate tricuspid regurgitation.  She reports partial improvement in symptoms with metoprolol but not full control.  In addition, she does report some recent exertional dyspnea with no chest discomfort.  She has significant bilateral leg edema worse at the end of the day.  She does use support stockings and takes furosemide daily now.   Past Medical History:  Diagnosis Date   Arthritis     Hyperlipidemia    Hypertension    Tumor    breast and pelvic    Past Surgical History:  Procedure Laterality Date   ABDOMINAL HYSTERECTOMY  2003   BREAST SURGERY     PULMONARY THROMBECTOMY N/A 10/10/2019   Procedure: PULMONARY THROMBECTOMY;  Surgeon: Algernon Huxley, MD;  Location: [facility];  Service: Cardiovascular;  Laterality: N/A;     Current Outpatient Medications  Medication Sig Dispense Refill   acetaminophen (TYLENOL) 500 MG tablet Take 500 mg by mouth every 6 (six) hours as needed.     apixaban (ELIQUIS) 5 MG TABS tablet Take 1 tablet (5 mg total) by mouth 2 (two) times daily. 60 tablet 3   Ascorbic Acid (VITAMIN C) 500 MG CAPS Take by mouth.     atorvastatin (LIPITOR) 40 MG tablet Take 1 tablet (40 mg total) by mouth daily. 90 tablet 4   FIBER PO Take by mouth daily.     fluticasone (FLONASE) 50 MCG/ACT nasal spray Place 2 sprays into both nostrils daily.     furosemide (LASIX) 20 MG tablet TAKE 1 TABLET BY MOUTH EVERY DAY AS NEEDED FOR EDEMA 90 tablet 2   levocetirizine (XYZAL) 5 MG tablet TAKE 1 TABLET BY MOUTH EVERY DAY IN THE EVENING 90 tablet 1   losartan (COZAAR) 100 MG tablet Take 1 tablet (100 mg total) by mouth daily. 90 tablet 4   metoprolol tartrate (LOPRESSOR) 25 MG tablet TAKE 1 TABLET BY MOUTH TWICE A DAY 60 tablet 5   VITAMIN D PO Take 1,000 Units by  mouth daily.      No current facility-administered medications for this visit.    Allergies:   Ibuprofen    Social History:  The patient  reports that she has never smoked. She has never used smokeless tobacco. She reports that she does not drink alcohol and does not use drugs.   Family History:  The patient's family history includes ALS in her father; Asthma in her son; Breast cancer in her sister; COPD in her mother; Colon cancer in her sister; Congestive Heart Failure in her maternal uncle; Diabetes in her brother; Heart disease in her sister; Hypertension in her brother; Sinusitis in her son.     ROS:  Please see the history of present illness.   Otherwise, review of systems are positive for none.   All other systems are reviewed and negative.    PHYSICAL EXAM: VS:  BP 120/70 (BP Location: Right Arm, Patient Position: Sitting, Cuff Size: Large)   Pulse 69   Ht 5\' 3"  (1.6 m)   Wt (!) 305 lb 8 oz (138.6 kg)   LMP  (LMP Unknown)   SpO2 97%   BMI 54.12 kg/m  , BMI Body mass index is 54.12 kg/m. GEN: Well nourished, well developed, in no acute distress  HEENT: normal  Neck: no JVD, carotid bruits, or masses Cardiac: RRR; no  rubs, or gallops, mild bilateral leg edema.  2 out of 6 systolic murmur in the aortic area which is early peaking. Respiratory:  clear to auscultation bilaterally, normal work of breathing GI: soft, nontender, nondistended, + BS MS: no deformity or atrophy  Skin: warm and dry, no rash Neuro:  Strength and sensation are intact Psych: euthymic mood, full affect   EKG:  EKG is ordered today. The ekg ordered today demonstrates normal sinus rhythm with PACs.   Recent Labs: 10/13/2019: Magnesium 1.9 11/19/2019: BNP 25.3 06/23/2020: ALT 15; BUN 15; Creatinine, Ser 0.62; Hemoglobin 13.8; Platelets 189; Potassium 3.9; Sodium 141; TSH 3.140    Lipid Panel    Component Value Date/Time   CHOL 138 06/23/2020 1115   TRIG 114 06/23/2020 1115   HDL 50 06/23/2020 1115   CHOLHDL 3.9 10/10/2019 0851   VLDL 14 10/10/2019 0851   LDLCALC 67 06/23/2020 1115      Wt Readings from Last 3 Encounters:  08/14/20 (!) 305 lb 8 oz (138.6 kg)  08/04/20 (!) 304 lb 9.6 oz (138.2 kg)  06/26/20 (!) 302 lb 4 oz (137.1 kg)        PAD Screen 06/26/2020  Previous PAD dx? No  Previous surgical procedure? No  Pain with walking? No  Feet/toe relief with dangling? No  Painful, non-healing ulcers? No  Extremities discolored? No      ASSESSMENT AND PLAN:  1.  PSVT: Symptoms improved with metoprolol 25 mg twice daily but not fully controlled her symptoms.  I discussed  the option of switching to a calcium channel blocker like diltiazem but there is a concern about worsening bilateral leg edema.  Thus, I elected to continue metoprolol for now.    2.  Exertional dyspnea: Echocardiogram showed normal LV systolic function and minimal pulmonary hypertension.  She does have multiple risk factors for coronary artery disease and thus I requested a Lexiscan Myoview.  3.  Essential hypertension: Blood pressure was elevated but is now normal after the addition of metoprolol.  4.  Hyperlipidemia: Currently on atorvastatin 40 mg daily .  I reviewed most recent lipid profile which showed an LDL of  37.  5.  History of submassive pulmonary embolism in the setting of COVID infection in September 2021: I agree with 1 year long of anticoagulation.    Disposition:   FU with me in 4 months.  Signed,  Kathlyn Sacramento, MD  08/14/2020 9:46 AM    [place]

## 2020-08-14 NOTE — Patient Instructions (Signed)
Medication Instructions:  Your physician recommends that you continue on your current medications as directed. Please refer to the Current Medication list given to you today.  *If you need a refill on your cardiac medications before your next appointment, please call your pharmacy*   Lab Work: None ordered If you have labs (blood work) drawn today and your tests are completely normal, you will receive your results only by: [place] (if you have MyChart) OR A paper copy in the mail If you have any lab test that is abnormal or we need to change your treatment, we will call you to review the results.   Testing/Procedures: Your physician has requested that you have a lexiscan myoview. For further information please visit [email or web address]. Please follow instruction sheet, as given.    Follow-Up: At [facility], you and your health needs are our priority.  As part of our continuing mission to provide you with exceptional heart care, we have created designated Provider Care Teams.  These Care Teams include your primary Cardiologist (physician) and Advanced Practice Providers (APPs -  Physician Assistants and Nurse Practitioners) who all work together to provide you with the care you need, when you need it.  We recommend signing up for the patient portal called "MyChart".  Sign up information is provided on this After Visit Summary.  MyChart is used to connect with patients for Virtual Visits (Telemedicine).  Patients are able to view lab/test results, encounter notes, upcoming appointments, etc.  Non-urgent messages can be sent to your provider as well.   To learn more about what you can do with MyChart, go to [email or web address].    Your next appointment:   4 month(s)  The format for your next appointment:   In Person  Provider:   You may see Kathlyn Sacramento, MD or one of the following Advanced Practice Providers on your designated Care Team:   Murray Hodgkins, NP Christell Faith, PA-C Marrianne Mood, PA-C Cadence Kathlen Mody, [place]   Other Instructions [place]  Your caregiver has ordered a Stress Test with nuclear imaging. The purpose of this test is to evaluate the blood supply to your heart muscle. This procedure is referred to as a "Non-Invasive Stress Test." This is because other than having an IV started in your vein, nothing is inserted or "invades" your body. Cardiac stress tests are done to find areas of poor blood flow to the heart by determining the extent of coronary artery disease (CAD). Some patients exercise on a treadmill, which naturally increases the blood flow to your heart, while others who are  unable to walk on a treadmill due to physical limitations have a pharmacologic/chemical stress agent called Lexiscan . This medicine will mimic walking on a treadmill by temporarily increasing your coronary blood flow.   Please note: these test may take anywhere between 2-4 hours to complete  PLEASE REPORT TO [place] AT THE FIRST DESK WILL DIRECT YOU WHERE TO GO  Date of Procedure:_____________________________________  Arrival Time for Procedure:______________________________  Instructions regarding medication:    __X__:  Hold betablocker (Metoprolol) night before procedure and morning of procedure  __X__:  Hold other medications as follows: Lasix the morning of the test. Can be taken later that day.   PLEASE NOTIFY THE OFFICE AT LEAST 95 HOURS IN ADVANCE IF YOU ARE UNABLE TO KEEP YOUR APPOINTMENT.  [phone] AND  PLEASE NOTIFY NUCLEAR MEDICINE AT [facility] AT LEAST 24 HOURS IN ADVANCE IF YOU ARE UNABLE TO  KEEP YOUR APPOINTMENT. [phone]  How to prepare for your Myoview test:  Do not eat or drink after midnight No caffeine for 24 hours prior to test No smoking 24 hours prior to test. Your medication may be taken with water.  If your doctor stopped a medication because of this test, do not take that  medication. Ladies, please do not wear dresses.  Skirts or pants are appropriate. Please wear a short sleeve shirt. No perfume or lotion. Wear comfortable walking shoes. No heels!

## 2020-08-19 ENCOUNTER — Ambulatory Visit: Payer: Self-pay | Admitting: *Deleted

## 2020-08-19 NOTE — Telephone Encounter (Signed)
Left lower leg edema with warmth and redness. It is posterior just above the ankle/heel area about size of her fist.Noticed 2-3 days ago, now leaking clear fluid.stated she has had 2 diagnosis of cellulitis this year. Requesting to be seen today. No availability to [facility]. Dr. Neomia Dear shows virtual slots open today at 2:20 or 4:20 pm. Patient could do either.  Please call patient if one of these slots can be used for her. Phone contact on chart is current. Reason for Disposition . [1] Red area or streak [2] large (> 2 in. or 5 cm)  Protocols used: Leg Swelling and Edema-A-AH

## 2020-08-19 NOTE — Telephone Encounter (Signed)
Noted  

## 2020-08-19 NOTE — Telephone Encounter (Signed)
Called pt scheduled for tomorrow with Santiago Glad

## 2020-08-19 NOTE — Telephone Encounter (Signed)
Please advise no appt today

## 2020-08-20 ENCOUNTER — Encounter: Payer: Self-pay | Admitting: Nurse Practitioner

## 2020-08-20 ENCOUNTER — Ambulatory Visit: Payer: Medicare Other | Admitting: Nurse Practitioner

## 2020-08-20 ENCOUNTER — Other Ambulatory Visit: Payer: Self-pay

## 2020-08-20 ENCOUNTER — Ambulatory Visit (INDEPENDENT_AMBULATORY_CARE_PROVIDER_SITE_OTHER): Payer: Medicare Other | Admitting: Nurse Practitioner

## 2020-08-20 VITALS — BP 157/109 | HR 62 | Temp 97.7°F | Wt 306.0 lb

## 2020-08-20 DIAGNOSIS — L03116 Cellulitis of left lower limb: Secondary | ICD-10-CM

## 2020-08-20 DIAGNOSIS — R600 Localized edema: Secondary | ICD-10-CM

## 2020-08-20 MED ORDER — SULFAMETHOXAZOLE-TRIMETHOPRIM 800-160 MG PO TABS: 1.0000 | ORAL_TABLET | Freq: Two times a day (BID) | ORAL | 0 refills | Status: AC

## 2020-08-20 NOTE — Progress Notes (Signed)
BP (!) 157/109   Pulse 62   Temp 97.7 F (36.5 C)   Wt (!) 306 lb (138.8 kg)   LMP  (LMP Unknown)   SpO2 96%   BMI 54.21 kg/m    Subjective:    Patient ID: Jasmine Webb, female    DOB: 1955-12-08, 65 y.o.   MRN: [id number]  HPI: Jasmine Webb is a 65 y.o. female  Chief Complaint  Patient presents with   Edema    Started Saturday, after eating peanut butter last week. Redness, left leg is weeping nausea chills and feels feverish    Patient states that on Saturday her legs started to swell.  Since then she had some redness, weeping, nausea and feels feverish.   Relevant past medical, surgical, family and social history reviewed and updated as indicated. Interim medical history since our last visit reviewed. Allergies and medications reviewed and updated.  Review of Systems  Constitutional:  Positive for fever.  Cardiovascular:  Positive for leg swelling.  Gastrointestinal:  Positive for nausea.   Per HPI unless specifically indicated above     Objective:    BP (!) 157/109   Pulse 62   Temp 97.7 F (36.5 C)   Wt (!) 306 lb (138.8 kg)   LMP  (LMP Unknown)   SpO2 96%   BMI 54.21 kg/m   Wt Readings from Last 3 Encounters:  08/20/20 (!) 306 lb (138.8 kg)  08/14/20 (!) 305 lb 8 oz (138.6 kg)  08/04/20 (!) 304 lb 9.6 oz (138.2 kg)    Physical Exam Vitals and nursing note reviewed.  Constitutional:      General: She is not in acute distress.    Appearance: Normal appearance. She is obese. She is not ill-appearing, toxic-appearing or diaphoretic.  HENT:     Head: Normocephalic.     Right Ear: External ear normal.     Left Ear: External ear normal.     Nose: Nose normal.     Mouth/Throat:     Mouth: Mucous membranes are moist.     Pharynx: Oropharynx is clear.  Eyes:     General:        Right eye: No discharge.        Left eye: No discharge.     Extraocular Movements: Extraocular movements intact.     Conjunctiva/sclera: Conjunctivae normal.     Pupils:  Pupils are equal, round, and reactive to light.  Cardiovascular:     Rate and Rhythm: Normal rate and regular rhythm.     Heart sounds: No murmur heard. Pulmonary:     Effort: Pulmonary effort is normal. No respiratory distress.     Breath sounds: Normal breath sounds. No wheezing or rales.  Musculoskeletal:     Cervical back: Normal range of motion and neck supple.     Right lower leg: Edema present.     Left lower leg: Edema present.  Skin:    General: Skin is warm and dry.     Capillary Refill: Capillary refill takes less than 2 seconds.       Neurological:     General: No focal deficit present.     Mental Status: She is alert and oriented to person, place, and time. Mental status is at baseline.  Psychiatric:        Mood and Affect: Mood normal.        Behavior: Behavior normal.        Thought Content: Thought content normal.  Judgment: Judgment normal.    Results for orders placed or performed in visit on 08/06/20  ECHOCARDIOGRAM COMPLETE  Result Value Ref Range   AR max vel 2.75 cm2   AV Peak grad 15.1 mmHg   Ao pk vel 1.94 m/s   Area-P 1/2 2.83 cm2   AV Area VTI 2.65 cm2   AV Mean grad 8.0 mmHg   AV Area mean vel 2.76 cm2   MV VTI 3.20 cm2      Assessment & Plan:   Problem List Items Addressed This Visit       Other   Bilateral lower extremity edema    Chronic. Exacerbated. Recommend increasing lasix to 40mg  daily for 5 days. If edema persists recommend following up for reevaluation.       Other Visit Diagnoses     Cellulitis of left lower extremity    -  Primary   Bactrim sent to treat cellulitis. Reviewd s/s to monitor for and when to seek higher level of care. Return to clinic if symptoms worsen or fail to improve.        Follow up plan: Return if symptoms worsen or fail to improve.

## 2020-08-20 NOTE — Assessment & Plan Note (Signed)
Chronic. Exacerbated. Recommend increasing lasix to 40mg  daily for 5 days. If edema persists recommend following up for reevaluation.

## 2020-08-20 NOTE — Progress Notes (Deleted)
   LMP  (LMP Unknown)    Subjective:    Patient ID: Jasmine Webb, female    DOB: 1955/02/22, 65 y.o.   MRN: [id number]  HPI: Jasmine Webb is a 65 y.o. female  No chief complaint on file.  SWELLING  Relevant past medical, surgical, family and social history reviewed and updated as indicated. Interim medical history since our last visit reviewed. Allergies and medications reviewed and updated.  Review of Systems  Per HPI unless specifically indicated above     Objective:    LMP  (LMP Unknown)   Wt Readings from Last 3 Encounters:  08/14/20 (!) 305 lb 8 oz (138.6 kg)  08/04/20 (!) 304 lb 9.6 oz (138.2 kg)  06/26/20 (!) 302 lb 4 oz (137.1 kg)    Physical Exam  Results for orders placed or performed in visit on 08/06/20  ECHOCARDIOGRAM COMPLETE  Result Value Ref Range   AR max vel 2.75 cm2   AV Peak grad 15.1 mmHg   Ao pk vel 1.94 m/s   Area-P 1/2 2.83 cm2   AV Area VTI 2.65 cm2   AV Mean grad 8.0 mmHg   AV Area mean vel 2.76 cm2   MV VTI 3.20 cm2      Assessment & Plan:   Problem List Items Addressed This Visit   None    Follow up plan: No follow-ups on file.

## 2020-08-24 ENCOUNTER — Ambulatory Visit
Admission: RE | Admit: 2020-08-24 | Discharge: 2020-08-24 | Disposition: A | Discharge status: Home / Self Care | Payer: Medicare Other | Source: Ambulatory Visit | Attending: Cardiovascular Disease | Admitting: Cardiovascular Disease

## 2020-08-24 ENCOUNTER — Other Ambulatory Visit: Payer: Self-pay

## 2020-08-24 ENCOUNTER — Ambulatory Visit: Payer: Medicare Other | Admitting: Nurse Practitioner

## 2020-08-24 DIAGNOSIS — R0600 Dyspnea, unspecified: Secondary | ICD-10-CM | POA: Insufficient documentation

## 2020-08-24 DIAGNOSIS — R0609 Other forms of dyspnea: Secondary | ICD-10-CM

## 2020-08-24 MED ORDER — TECHNETIUM TC 99M TETROFOSMIN IV KIT
30.0000 | PACK | Freq: Once | INTRAVENOUS | Status: AC | PRN
  Administered 2020-08-24: 29.48 via INTRAVENOUS

## 2020-08-24 MED ORDER — REGADENOSON 0.4 MG/5ML IV SOLN
0.4000 mg | Freq: Once | INTRAVENOUS | Status: AC
  Administered 2020-08-24: 0.4 mg via INTRAVENOUS

## 2020-08-25 ENCOUNTER — Encounter
Admission: RE | Admit: 2020-08-25 | Discharge: 2020-08-25 | Disposition: A | Discharge status: Home / Self Care | Payer: Medicare Other | Source: Ambulatory Visit | Attending: Cardiovascular Disease | Admitting: Cardiovascular Disease

## 2020-08-25 LAB — NM MYOCAR MULTI W/SPECT W/WALL MOTION / EF
LV dias vol: 76 mL (ref 46–106)
LV sys vol: 30 mL
Peak HR: 90 {beats}/min
Percent HR: 58 %
Rest HR: 88 {beats}/min
TID: 0.82

## 2020-08-25 MED ORDER — TECHNETIUM TC 99M TETROFOSMIN IV KIT
30.0000 | PACK | Freq: Once | INTRAVENOUS | Status: AC | PRN
  Administered 2020-08-25: 28.79 via INTRAVENOUS

## 2020-09-17 ENCOUNTER — Other Ambulatory Visit: Payer: Self-pay

## 2020-09-17 DIAGNOSIS — Z1231 Encounter for screening mammogram for malignant neoplasm of breast: Secondary | ICD-10-CM

## 2020-11-01 ENCOUNTER — Other Ambulatory Visit: Payer: Self-pay | Admitting: Nurse Practitioner

## 2020-11-08 ENCOUNTER — Other Ambulatory Visit: Payer: Self-pay | Admitting: Nurse Practitioner

## 2020-11-08 NOTE — Telephone Encounter (Signed)
Called [facility] and spoke with [facility]) informed her that a duplicate request was sent. Advised med was ordered days ago. Was advised pt has already picked up prescription.  Requested Prescriptions  Pending Prescriptions Disp Refills  . ELIQUIS 5 MG TABS tablet [Pharmacy Med Name: ELIQUIS 5 MG TABLET] 180 tablet     Sig: TAKE 1 TABLET BY MOUTH TWICE A DAY     Hematology:  Anticoagulants Passed - 11/08/2020  1:45 PM      Passed - HGB in normal range and within 360 days    Hemoglobin  Date Value Ref Range Status  06/23/2020 13.8 11.1 - 15.9 g/dL Final         Passed - PLT in normal range and within 360 days    Platelets  Date Value Ref Range Status  06/23/2020 189 150 - 450 x10E3/uL Final         Passed - HCT in normal range and within 360 days    Hematocrit  Date Value Ref Range Status  06/23/2020 42.1 34.0 - 46.6 % Final         Passed - Cr in normal range and within 360 days    Creatinine, Ser  Date Value Ref Range Status  06/23/2020 0.62 0.57 - 1.00 mg/dL Final         Passed - Valid encounter within last 12 months    Recent Outpatient Visits          2 months ago Cellulitis of left lower extremity   [facility] Jon Billings, NP   3 months ago SVT (supraventricular tachycardia) ([place])   [place], [place] T, NP   4 months ago Morbid obesity due to excess calories ([place])   [place], [place] T, NP   6 months ago Seasonal allergic rhinitis due to pollen   [facility], Megan P, DO   10 months ago Bilateral pulmonary embolism ([place])   [place], Barbaraann Faster, NP      Future Appointments            In 1 month [place], Mertie Clause, MD [place], [facility]   In 2 months  [facility], [place]   In 2 months Cannady, [place] T, NP [facility], [facility]           . levocetirizine (XYZAL) 5 MG tablet [employer] Med Name: LEVOCETIRIZINE 5  MG TABLET] 90 tablet 1    Sig: TAKE 1 TABLET BY MOUTH EVERY DAY IN THE EVENING     Ear, Nose, and Throat:  Antihistamines Passed - 11/08/2020  1:45 PM      Passed - Valid encounter within last 12 months    Recent Outpatient Visits          2 months ago Cellulitis of left lower extremity   [facility] Jon Billings, NP   3 months ago SVT (supraventricular tachycardia) ([place])   [place], [place] T, NP   4 months ago Morbid obesity due to excess calories ([place])   [place], [place] T, NP   6 months ago Seasonal allergic rhinitis due to pollen   [place], DO   10 months ago Bilateral pulmonary embolism ([place])   [place] Venita Lick, NP      Future Appointments            In 1 month Fletcher Anon, [place] A,  MD [place], [facility]   In 2 months  [place], [place]   In 2 months Cannady, Barbaraann Faster, NP [facility], [facility]

## 2020-11-13 ENCOUNTER — Emergency Department
Admission: EM | Admit: 2020-11-13 | Discharge: 2020-11-14 | Disposition: A | Discharge status: Home / Self Care | Payer: Medicare Other | Attending: Emergency Medicine | Admitting: Emergency Medicine

## 2020-11-13 ENCOUNTER — Other Ambulatory Visit: Payer: Self-pay

## 2020-11-13 ENCOUNTER — Emergency Department: Payer: Medicare Other

## 2020-11-13 ENCOUNTER — Other Ambulatory Visit: Payer: Self-pay | Admitting: Nurse Practitioner

## 2020-11-13 DIAGNOSIS — R42 Dizziness and giddiness: Secondary | ICD-10-CM | POA: Diagnosis not present

## 2020-11-13 DIAGNOSIS — Z20822 Contact with and (suspected) exposure to covid-19: Secondary | ICD-10-CM | POA: Insufficient documentation

## 2020-11-13 DIAGNOSIS — Z79899 Other long term (current) drug therapy: Secondary | ICD-10-CM | POA: Diagnosis not present

## 2020-11-13 DIAGNOSIS — E876 Hypokalemia: Secondary | ICD-10-CM | POA: Insufficient documentation

## 2020-11-13 DIAGNOSIS — R112 Nausea with vomiting, unspecified: Secondary | ICD-10-CM | POA: Insufficient documentation

## 2020-11-13 DIAGNOSIS — I499 Cardiac arrhythmia, unspecified: Secondary | ICD-10-CM | POA: Diagnosis not present

## 2020-11-13 DIAGNOSIS — Z7901 Long term (current) use of anticoagulants: Secondary | ICD-10-CM | POA: Insufficient documentation

## 2020-11-13 DIAGNOSIS — R079 Chest pain, unspecified: Secondary | ICD-10-CM | POA: Diagnosis not present

## 2020-11-13 DIAGNOSIS — I10 Essential (primary) hypertension: Secondary | ICD-10-CM | POA: Insufficient documentation

## 2020-11-13 DIAGNOSIS — Z7951 Long term (current) use of inhaled steroids: Secondary | ICD-10-CM | POA: Insufficient documentation

## 2020-11-13 DIAGNOSIS — R6889 Other general symptoms and signs: Secondary | ICD-10-CM | POA: Diagnosis not present

## 2020-11-13 DIAGNOSIS — R404 Transient alteration of awareness: Secondary | ICD-10-CM | POA: Diagnosis not present

## 2020-11-13 DIAGNOSIS — Z743 Need for continuous supervision: Secondary | ICD-10-CM | POA: Diagnosis not present

## 2020-11-13 DIAGNOSIS — Z8616 Personal history of COVID-19: Secondary | ICD-10-CM | POA: Insufficient documentation

## 2020-11-13 LAB — CBC WITH DIFFERENTIAL/PLATELET
Abs Immature Granulocytes: 0.08 10*3/uL — ABNORMAL HIGH (ref 0.00–0.07)
Basophils Absolute: 0 10*3/uL (ref 0.0–0.1)
Basophils Relative: 0 %
Eosinophils Absolute: 0.2 10*3/uL (ref 0.0–0.5)
Eosinophils Relative: 2 %
HCT: 36.6 % (ref 36.0–46.0)
Hemoglobin: 12.4 g/dL (ref 12.0–15.0)
Immature Granulocytes: 1 %
Lymphocytes Relative: 22 %
Lymphs Abs: 2.1 10*3/uL (ref 0.7–4.0)
MCH: 29.2 pg (ref 26.0–34.0)
MCHC: 33.9 g/dL (ref 30.0–36.0)
MCV: 86.3 fL (ref 80.0–100.0)
Monocytes Absolute: 0.7 10*3/uL (ref 0.1–1.0)
Monocytes Relative: 8 %
Neutro Abs: 6.4 10*3/uL (ref 1.7–7.7)
Neutrophils Relative %: 67 %
Platelets: 217 10*3/uL (ref 150–400)
RBC: 4.24 MIL/uL (ref 3.87–5.11)
RDW: 13.2 % (ref 11.5–15.5)
WBC: 9.5 10*3/uL (ref 4.0–10.5)
nRBC: 0 % (ref 0.0–0.2)

## 2020-11-13 LAB — BASIC METABOLIC PANEL
Anion gap: 9 (ref 5–15)
BUN: 14 mg/dL (ref 8–23)
CO2: 30 mmol/L (ref 22–32)
Calcium: 9 mg/dL (ref 8.9–10.3)
Chloride: 102 mmol/L (ref 98–111)
Creatinine, Ser: 0.71 mg/dL (ref 0.44–1.00)
GFR, Estimated: >60 mL/min (ref >60)
Glucose, Bld: 122 mg/dL — ABNORMAL HIGH (ref 70–99)
Potassium: 3 mmol/L — ABNORMAL LOW (ref 3.5–5.1)
Sodium: 141 mmol/L (ref 135–145)

## 2020-11-13 LAB — RESP PANEL BY RT-PCR (FLU A&B, COVID) ARPGX2
Influenza A by PCR: NEGATIVE
Influenza B by PCR: NEGATIVE
SARS Coronavirus 2 by RT PCR: NEGATIVE

## 2020-11-13 MED ORDER — LORAZEPAM 2 MG/ML IJ SOLN
0.5000 mg | Freq: Once | INTRAMUSCULAR | Status: AC
  Administered 2020-11-13: 0.5 mg via INTRAVENOUS
  Filled 2020-11-13: qty 1

## 2020-11-13 MED ORDER — FUROSEMIDE 20 MG PO TABS: ORAL_TABLET | ORAL | 2 refills | Status: DC

## 2020-11-13 MED ORDER — MECLIZINE HCL 25 MG PO TABS: 25.0000 mg | ORAL_TABLET | Freq: Three times a day (TID) | ORAL | 0 refills | Status: AC | PRN

## 2020-11-13 MED ORDER — METOCLOPRAMIDE HCL 5 MG/ML IJ SOLN
10.0000 mg | Freq: Once | INTRAMUSCULAR | Status: AC
  Administered 2020-11-13: 10 mg via INTRAVENOUS
  Filled 2020-11-13: qty 2

## 2020-11-13 MED ORDER — POTASSIUM CHLORIDE CRYS ER 20 MEQ PO TBCR
40.0000 meq | EXTENDED_RELEASE_TABLET | Freq: Once | ORAL | Status: AC
  Administered 2020-11-13: 40 meq via ORAL
  Filled 2020-11-13: qty 2

## 2020-11-13 NOTE — ED Provider Notes (Signed)
[facility]  ____________________________________________   Event Date/Time   First MD Initiated Contact with Patient 11/13/20 2035     (approximate)  I have reviewed the triage vital signs and the nursing notes.   HISTORY  Chief Complaint Emesis and Dizziness    HPI Jasmine Webb is a 65 y.o. female with past medical history of pulmonary embolism on Eliquis, hypertension, hyperlipidemia, SVT who presents with acute onset of dizziness.  Symptoms started tonight.  She endorses room spinning sensation as well as significant nausea and vomiting.  She complains of fullness in her right ear.  Has never had the symptoms before.  She denies diplopia dysarthria numbness or weakness.  Denies headache.         Past Medical History:  Diagnosis Date   Arthritis    Hyperlipidemia    Hypertension    Tumor    breast and pelvic    Patient Active Problem List   Diagnosis Date Noted   DNR (do not resuscitate) 08/04/2020   SVT (supraventricular tachycardia) ([place]) 08/03/2020   Irregular heartbeat 06/23/2020   Heart murmur, systolic 29/92/4268   Bilateral lower extremity edema 11/19/2019   History of COVID-19 10/10/2019   History of pulmonary embolus (PE) 10/10/2019   Morbid obesity due to excess calories ([place]) 08/09/2019   Hyperlipidemia 07/05/2019   Vitamin D deficiency 07/05/2019   Essential hypertension 04/16/2019   Preventative health care 04/16/2019   Allergic rhinitis 04/16/2019   Arthritis 04/16/2019   BMI 50.0-59.9, adult ([place]) 04/16/2019    Past Surgical History:  Procedure Laterality Date   ABDOMINAL HYSTERECTOMY  2003   BREAST SURGERY     PULMONARY THROMBECTOMY N/A 10/10/2019   Procedure: PULMONARY THROMBECTOMY;  Surgeon: Algernon Huxley, MD;  Location: [facility];  Service: Cardiovascular;  Laterality: N/A;    Prior to Admission medications   Medication Sig Start Date End Date Taking? Authorizing Provider  meclizine (ANTIVERT) 25 MG  tablet Take 1 tablet (25 mg total) by mouth 3 (three) times daily as needed for up to 10 days for dizziness. 11/13/20 11/23/20 Yes Rada Hay, MD  acetaminophen (TYLENOL) 500 MG tablet Take 500 mg by mouth every 6 (six) hours as needed.    [provider]  Ascorbic Acid (VITAMIN C) 500 MG CAPS Take by mouth.    [provider]  atorvastatin (LIPITOR) 40 MG tablet Take 1 tablet (40 mg total) by mouth daily. 11/22/19   Cannady, Jolene T, NP  ELIQUIS 5 MG TABS tablet TAKE 1 TABLET BY MOUTH TWICE A DAY 11/01/20   Cannady, Jolene T, NP  FIBER PO Take by mouth daily.    [provider]  fluticasone (FLONASE) 50 MCG/ACT nasal spray Place 2 sprays into both nostrils daily.    [provider]  furosemide (LASIX) 20 MG tablet TAKE 1 TABLET BY MOUTH EVERY DAY AS NEEDED FOR EDEMA 11/13/20   Cannady, Jolene T, NP  levocetirizine (XYZAL) 5 MG tablet TAKE 1 TABLET BY MOUTH EVERY DAY IN THE EVENING 11/08/20   Cannady, Jolene T, NP  losartan (COZAAR) 100 MG tablet Take 1 tablet (100 mg total) by mouth daily. 11/19/19   Cannady, Henrine Screws T, NP  metoprolol tartrate (LOPRESSOR) 25 MG tablet TAKE 1 TABLET BY MOUTH TWICE A DAY 08/13/20   Wellington [place], MD  VITAMIN D PO Take 1,000 Units by mouth daily.     [provider]    Allergies Ibuprofen  Family History  Problem Relation  Age of Onset   COPD Mother    ALS Father    Heart disease Sister    Breast cancer Sister    Colon cancer Sister    Diabetes Brother    Hypertension Brother    Asthma Son    Sinusitis Son    Congestive Heart Failure Maternal Uncle     Social History Social History   Tobacco Use   Smoking status: Never   Smokeless tobacco: Never  Vaping Use   Vaping Use: Never used  Substance Use Topics   Alcohol use: Never   Drug use: Never    Review of Systems   Review of Systems  Constitutional:  Positive for chills and fatigue. Negative for fever.  HENT:  Negative for congestion.    Respiratory:  Negative for cough and shortness of breath.   Cardiovascular:  Negative for chest pain.  Neurological:  Positive for dizziness. Negative for weakness, numbness and headaches.  All other systems reviewed and are negative.  Physical Exam Updated Vital Signs BP 129/68 (BP Location: Right Arm)   Pulse 92   Temp 98.4 F (36.9 C) (Axillary)   Resp 15   LMP  (LMP Unknown)   SpO2 100%   Physical Exam Vitals and nursing note reviewed.  Constitutional:      General: She is in acute distress.     Appearance: Normal appearance.     Comments: Patient actively vomiting, appears distressed  HENT:     Head: Normocephalic and atraumatic.  Eyes:     General: No scleral icterus.    Conjunctiva/sclera: Conjunctivae normal.  Pulmonary:     Effort: Pulmonary effort is normal. No respiratory distress.     Breath sounds: No stridor.  Abdominal:     General: There is no distension.     Palpations: Abdomen is soft.     Tenderness: There is no abdominal tenderness.  Musculoskeletal:        General: No deformity or signs of injury.     Cervical back: Normal range of motion.  Skin:    General: Skin is dry.     Coloration: Skin is not jaundiced or pale.  Neurological:     Mental Status: She is alert.     Comments: Aox3, nml speech  PERRL, EOMI, face symmetric, nml tongue movement  L horizontal nystagmus, no vertical nystagmus 5/5 strength in the BL upper and lower extremities  Sensation grossly intact in the BL upper and lower extremities  Finger-nose-finger intact BL   Psychiatric:        Mood and Affect: Mood normal.        Behavior: Behavior normal.     LABS (all labs ordered are listed, but only abnormal results are displayed)  Labs Reviewed  CBC WITH DIFFERENTIAL/PLATELET - Abnormal; Notable for the following components:      Result Value   Abs Immature Granulocytes 0.08 (*)    All other components within normal limits  BASIC METABOLIC PANEL - Abnormal; Notable  for the following components:   Potassium 3.0 (*)    Glucose, Bld 122 (*)    All other components within normal limits  RESP PANEL BY RT-PCR (FLU A&B, COVID) ARPGX2  URINALYSIS, COMPLETE (UACMP) WITH MICROSCOPIC   ____________________________________________  EKG  Normal sinus rhythm, normal axis, normal intervals, no acute ischemic changes ____________________________________________  RADIOLOGY Almeta Monas, personally viewed and evaluated these images (plain radiographs) as part of my medical decision making, as well as reviewing the written  report by the radiologist.  ED MD interpretation:  I reviewed the CT scan of the brain which does not show any acute intracranial process      ____________________________________________   PROCEDURES  Procedure(s) performed (including Critical Care):  Procedures   ____________________________________________   INITIAL IMPRESSION / ASSESSMENT AND PLAN / ED COURSE     Patient is a 65 year old female since with acute onset of vertigo.  She is very symptomatic upon arrival to the ED.  She is actively vomiting.  Symptoms significantly worsen when she moves her head or opens her eyes.  On exam she has equal pupils, normal cranial nerves and left beating nystagmus.  Rest of neurologic exam is somewhat limited initially due to her significant vomiting and distress.  Differential includes central versus peripheral vertigo.  Will obtain a CT head and treat with Reglan and Ativan for her vertigo.  Will obtain basic labs as well.  Patient CT head is negative.  After Reglan and Ativan patient significantly feeling improved.  Her vertigo is now resolved.  She is somewhat sleepy after the dose of 0.5 mg of Ativan.  On repeat neurologic exam she again has normal cranial nerves, nystagmus significantly improved, normal strength in her bilateral upper lower extremities and negative finger-to-nose testing.  Too sleepy to ambulate at this point.     I have low suspicion for central vertigo at this time given how her symptoms have significantly resolved and her normal cerebellar testing.    Patient was observed in the ED for several hours.  She was able to ambulate without assistance.  No additional dizziness nausea or vomiting.  Given her reassuring exam, normal CT head and lack of recurrent symptoms, will discharge.  Discussed return precautions for any new neurologic symptoms. Clinical Course as of 11/13/20 2316  Fri Nov 13, 2020  2107 Potassium(!): 3.0 [KM]    Clinical Course User Index [KM] Rada Hay, MD     ____________________________________________   FINAL CLINICAL IMPRESSION(S) / ED DIAGNOSES  Final diagnoses:  Vertigo  Hypokalemia     ED Discharge Orders          Ordered    meclizine (ANTIVERT) 25 MG tablet  3 times daily PRN        11/13/20 2315             Note:  This document was prepared using Dragon voice recognition software and may include unintentional dictation errors.    Rada Hay, MD 11/13/20 [phone]

## 2020-11-13 NOTE — Telephone Encounter (Signed)
Medication Refill - Medication: furosemide (LASIX) 20 MG tablet  Has the patient contacted their pharmacy? Yes.   However the pt was instructed to increase this medication to 2 /day and the current Rx is for 1 /day- new Rx is needed  Preferred Pharmacy (with phone number or street name): [id number] [address], [place] the patient been seen for an appointment in the last year OR does the patient have an upcoming appointment? Yes.    Agent: Please be advised that RX refills may take up to 3 business days. We ask that you follow-up with your pharmacy.

## 2020-11-13 NOTE — ED Notes (Signed)
Patient ambulated to bathroom.  Steady gait noted.  Patient reports improvement in symptoms.  Son called and currently on the way for d/c

## 2020-11-13 NOTE — ED Notes (Signed)
Patient resting quietly at this time. Reports improvement in symptoms.  No vomiting at this time

## 2020-11-13 NOTE — ED Triage Notes (Signed)
Patient BIB EMS for evaluation of sudden onset dizziness and vomiting.  Reports she was feeling "good" this morning.  Dizziness "came out of no where."  Has had vomiting.  Given Zofran 4 mg IV PTA

## 2020-11-13 NOTE — Discharge Instructions (Addendum)
Your dizziness was likely from vertigo due to an inner ear problem.  If your symptoms return, you can take meclizine.  Importantly if you develop double vision, numbness, weakness or your dizziness is not improving, please return to the emergency department.

## 2020-11-15 ENCOUNTER — Encounter: Payer: Self-pay | Admitting: Emergency Medicine

## 2020-11-15 ENCOUNTER — Emergency Department
Admission: EM | Admit: 2020-11-15 | Discharge: 2020-11-15 | Disposition: A | Discharge status: Home / Self Care | Payer: Medicare Other | Attending: Emergency Medicine | Admitting: Emergency Medicine

## 2020-11-15 ENCOUNTER — Other Ambulatory Visit: Payer: Self-pay

## 2020-11-15 DIAGNOSIS — Z79899 Other long term (current) drug therapy: Secondary | ICD-10-CM | POA: Diagnosis not present

## 2020-11-15 DIAGNOSIS — Z8553 Personal history of malignant neoplasm of renal pelvis: Secondary | ICD-10-CM | POA: Diagnosis not present

## 2020-11-15 DIAGNOSIS — L97929 Non-pressure chronic ulcer of unspecified part of left lower leg with unspecified severity: Secondary | ICD-10-CM | POA: Diagnosis not present

## 2020-11-15 DIAGNOSIS — L97921 Non-pressure chronic ulcer of unspecified part of left lower leg limited to breakdown of skin: Secondary | ICD-10-CM | POA: Insufficient documentation

## 2020-11-15 DIAGNOSIS — Z853 Personal history of malignant neoplasm of breast: Secondary | ICD-10-CM | POA: Insufficient documentation

## 2020-11-15 DIAGNOSIS — Z8616 Personal history of COVID-19: Secondary | ICD-10-CM | POA: Diagnosis not present

## 2020-11-15 DIAGNOSIS — H6123 Impacted cerumen, bilateral: Secondary | ICD-10-CM | POA: Diagnosis not present

## 2020-11-15 DIAGNOSIS — H9203 Otalgia, bilateral: Secondary | ICD-10-CM | POA: Diagnosis present

## 2020-11-15 DIAGNOSIS — I10 Essential (primary) hypertension: Secondary | ICD-10-CM | POA: Insufficient documentation

## 2020-11-15 DIAGNOSIS — Z7901 Long term (current) use of anticoagulants: Secondary | ICD-10-CM | POA: Insufficient documentation

## 2020-11-15 MED ORDER — CARBAMIDE PEROXIDE 6.5 % OT SOLN
5.0000 [drp] | Freq: Once | OTIC | Status: AC
  Administered 2020-11-15: 5 [drp] via OTIC
  Filled 2020-11-15: qty 15

## 2020-11-15 NOTE — Discharge Instructions (Addendum)
You were seen today for right ear pain.  There is no evidence of infection on your CT scan.  You do have wax buildup bilaterally.  We tried to remove this however we were unsuccessful.  Please use the Debrox daily.  Call ENT and schedule a follow-up appointment for further Rex removal.  Follow-up with your PCP and vascular regarding the ulcer to your left lower extremity.

## 2020-11-15 NOTE — ED Provider Notes (Signed)
[facility] Emergency Department Provider Note ____________________________________________  Time seen: 0800  I have reviewed the triage vital signs and the nursing notes.  HISTORY  Chief Complaint  Ear Pain and Leg Swelling   HPI Jasmine Webb is a 65 y.o. female presents to the ER today with complaint of right ear pain.  She reports this started 2 days ago.  She describes the pain as sore and achy.  She also reports a static noise in her right ear.  She denies ear drainage, runny nose, nasal congestion, sore throat or cough.  She reports she has run some fevers but denies chills or body aches.  She reports she was seen in the ER 10/14 for vertigo and that a ear infection was seen on her CT scan however they did not treat her for this.  She is requesting treatment for this today.  She also reports weeping from an ulcer of her left lower extremity.  She reports this has been a chronic issue.  She has been evaluated by cardiology but is awaiting evaluation by vascular.  She has not taken anything OTC for symptoms.  Past Medical History:  Diagnosis Date   Arthritis    Hyperlipidemia    Hypertension    Tumor    breast and pelvic    Patient Active Problem List   Diagnosis Date Noted   DNR (do not resuscitate) 08/04/2020   SVT (supraventricular tachycardia) ([place]) 08/03/2020   Irregular heartbeat 06/23/2020   Heart murmur, systolic 71/24/5809   Bilateral lower extremity edema 11/19/2019   History of COVID-19 10/10/2019   History of pulmonary embolus (PE) 10/10/2019   Morbid obesity due to excess calories ([place]) 08/09/2019   Hyperlipidemia 07/05/2019   Vitamin D deficiency 07/05/2019   Essential hypertension 04/16/2019   Preventative health care 04/16/2019   Allergic rhinitis 04/16/2019   Arthritis 04/16/2019   BMI 50.0-59.9, adult ([place]) 04/16/2019    Past Surgical History:  Procedure Laterality Date   ABDOMINAL HYSTERECTOMY  2003   BREAST SURGERY      PULMONARY THROMBECTOMY N/A 10/10/2019   Procedure: PULMONARY THROMBECTOMY;  Surgeon: Algernon Huxley, MD;  Location: [place] [facility];  Service: Cardiovascular;  Laterality: N/A;    Prior to Admission medications   Medication Sig Start Date End Date Taking? Authorizing Provider  acetaminophen (TYLENOL) 500 MG tablet Take 500 mg by mouth every 6 (six) hours as needed.    [provider]  Ascorbic Acid (VITAMIN C) 500 MG CAPS Take by mouth.    [provider]  atorvastatin (LIPITOR) 40 MG tablet Take 1 tablet (40 mg total) by mouth daily. 11/22/19   Cannady, Jolene T, NP  ELIQUIS 5 MG TABS tablet TAKE 1 TABLET BY MOUTH TWICE A DAY 11/01/20   Cannady, Jolene T, NP  FIBER PO Take by mouth daily.    [provider]  fluticasone (FLONASE) 50 MCG/ACT nasal spray Place 2 sprays into both nostrils daily.    [provider]  furosemide (LASIX) 20 MG tablet TAKE 1 TABLET BY MOUTH EVERY DAY AS NEEDED FOR EDEMA 11/13/20   Cannady, Jolene T, NP  levocetirizine (XYZAL) 5 MG tablet TAKE 1 TABLET BY MOUTH EVERY DAY IN THE EVENING 11/08/20   Cannady, Jolene T, NP  losartan (COZAAR) 100 MG tablet Take 1 tablet (100 mg total) by mouth daily. 11/19/19   Cannady, Henrine Screws T, NP  meclizine (ANTIVERT) 25 MG tablet Take 1 tablet (25 mg total) by mouth 3 (three) times  daily as needed for up to 10 days for dizziness. 11/13/20 11/23/20  Rada Hay, MD  metoprolol tartrate (LOPRESSOR) 25 MG tablet TAKE 1 TABLET BY MOUTH TWICE A DAY 08/13/20   Wellington Hampshire, MD  VITAMIN D PO Take 1,000 Units by mouth daily.     [provider]    Allergies Ibuprofen  Family History  Problem Relation Age of Onset   COPD Mother    ALS Father    Heart disease Sister    Breast cancer Sister    Colon cancer Sister    Diabetes Brother    Hypertension Brother    Asthma Son    Sinusitis Son    Congestive Heart Failure Maternal Uncle     Social History Social History   Tobacco Use    Smoking status: Never   Smokeless tobacco: Never  Vaping Use   Vaping Use: Never used  Substance Use Topics   Alcohol use: Never   Drug use: Never    Review of Systems  Constitutional: Positive for fever.  Negative for chills or body aches Eyes: Negative for eye pain, eye redness, discharge or visual changes. ENT: Positive for right ear pain and decreased hearing.  Negative for runny nose, nasal congestion or sore throat. Cardiovascular: Negative for chest pain or chest tightness. Respiratory: Negative for cough or shortness of breath. Gastrointestinal: Negative for nausea vomiting and diarrhea. Skin: Negative for rash. Neurological: Negative for headaches or current dizziness. ____________________________________________  PHYSICAL EXAM:  VITAL SIGNS: ED Triage Vitals [11/15/20 0749]  Enc Vitals Group     BP 136/74     Pulse Rate 75     Resp 20     Temp 97.7 F (36.5 C)     Temp Source Oral     SpO2 95 %     Weight (!) 306 lb (138.8 kg)     Height 5\' 3"  (1.6 m)     Head Circumference      Peak Flow      Pain Score 8     Pain Loc      Pain Edu?      Excl. in [place]?     Constitutional: Alert and oriented.  Obese, in no distress. Head: Normocephalic. Eyes: Normal extraocular movements Ears: Bilateral cerumen impaction. Hematological/Lymphatic/Immunological: No cervical lymphadenopathy. Cardiovascular: Normal rate, regular rhythm.  1+ nonpitting edema bilaterally Respiratory: Normal respiratory effort. No wheezes/rales/rhonchi. Neurologic:   Normal speech and language. No gross focal neurologic deficits are appreciated. Skin: 2 cm x 2 cm ulceration noted of the left posterior calf, does not appear to be weeping at this time.  INITIAL IMPRESSION / ASSESSMENT AND PLAN / ED COURSE  Otalgia, Right and Ulceration of RLE:  DDx include otitis media, cerumen impaction Manual lavage by RN, unsuccessful Post lavage ear exam shows wax remaining in the ear canal, unable to  visualize TM Will have her follow up with ENT as an outpatient Ulcer cleansed, covered with Telfa, gauze and ACE wrap She will follow up with her PCP and vascular as an outpatient   _____________________________________  FINAL CLINICAL IMPRESSION(S) / ED DIAGNOSES  Final diagnoses:  Bilateral impacted cerumen  Ulcer of left lower extremity, limited to breakdown of skin [facility])      Jearld Fenton, NP 11/15/20 5726    Vladimir Crofts, MD 11/15/20 1306

## 2020-11-15 NOTE — ED Triage Notes (Addendum)
Pt to ED via POV c/o, Pt states that her PCP told her to come to the ED because she has an ear infection that was seen on her CT that she had here a few days ago. Pt also states that her leg is weeping this morning, pt states that this happens intermittently. Pt ambulatory into the ED, in NAD.

## 2020-11-15 NOTE — ED Notes (Signed)
Irrigated right ear with 30 mls of warm water and h2o2  min to no results  Provider aware

## 2020-11-16 ENCOUNTER — Telehealth: Payer: Self-pay

## 2020-11-16 NOTE — Telephone Encounter (Signed)
Patient states she was seen over the weekend in the ER for inner ear infection and vertigo. Patient states she was given a prescription for her vertigo, but nothing was prescribed for her inner ear infection. Please advise?

## 2020-11-16 NOTE — Telephone Encounter (Addendum)
Please alert her there is no mention of ear infection in ER note from weekend or on 11/13/20.  Yesterday's ER note recommend follow-up with PCP and vascular + ENT -- they reported she has cerumen impaction which was unable to be cleared and would need ENT evaluation as this may be causing her vertigo.  Their instructions:  "Will have her follow up with ENT as an outpatient  Ulcer cleansed, covered with Telfa, gauze and ACE wrap  She will follow up with her PCP and vascular as an outpatient"  Does she have and ENT locally already?  If not I can place referral for this.  I do not see referral to vascular, is she scheduled to see them.  There is mention in note that cardiology ordered this, but I do not see this.  With her having a wound I would like her seen in office please  Spoke with pt and message given from [facility] NP. Pt has a cardiology appt and her ENT appt is this Thursday with Dr Clyde Canterbury. Pt wants to wait for the vasular referral. Pt stated her wound is much better. She cut out sugar in her diet. She said the wound is not red and there is a scab. Pt did not want to make appt at this time.

## 2020-11-16 NOTE — Telephone Encounter (Signed)
Left message for patient to give our office a call back to discuss Jolene's previous message in regards to patient's call.   OK for [facility] to give note if patient calls back.

## 2020-11-18 ENCOUNTER — Telehealth: Payer: Self-pay | Admitting: Nurse Practitioner

## 2020-11-18 NOTE — Telephone Encounter (Signed)
Patient is asking for short supply until refill comes in

## 2020-11-18 NOTE — Addendum Note (Signed)
Addended by: Mliss Sax on: 11/18/2020 10:37 AM   Modules accepted: Orders

## 2020-11-18 NOTE — Telephone Encounter (Signed)
Requested medication (s) are due for refill today: no, see request  Requested medication (s) are on the active medication list: yes  Last refill:  11/13/20 #90 2 refills  Future visit scheduled: yes in 3 weeks  Notes to clinic:  pt reports she was instructed to increase this medication to 2 /day and the current Rx is for 1 /day- new Rx is needed. Patient requesting more medication until she gets her supply in. Patient has run out .      Requested Prescriptions  Pending Prescriptions Disp Refills   furosemide (LASIX) 20 MG tablet 90 tablet 2    Sig: TAKE 1 TABLET BY MOUTH EVERY DAY AS NEEDED FOR EDEMA     Cardiovascular:  Diuretics - Loop Failed - 11/18/2020 10:37 AM      Failed - K in normal range and within 360 days    Potassium  Date Value Ref Range Status  11/13/2020 3.0 (L) 3.5 - 5.1 mmol/L Final          Passed - Ca in normal range and within 360 days    Calcium  Date Value Ref Range Status  11/13/2020 9.0 8.9 - 10.3 mg/dL Final          Passed - Na in normal range and within 360 days    Sodium  Date Value Ref Range Status  11/13/2020 141 135 - 145 mmol/L Final  06/23/2020 141 134 - 144 mmol/L Final          Passed - Cr in normal range and within 360 days    Creatinine, Ser  Date Value Ref Range Status  11/13/2020 0.71 0.44 - 1.00 mg/dL Final          Passed - Last BP in normal range    BP Readings from Last 1 Encounters:  11/15/20 136/74          Passed - Valid encounter within last 6 months    Recent Outpatient Visits           3 months ago Cellulitis of left lower extremity   [facility] Jon Billings, NP   3 months ago SVT (supraventricular tachycardia) ([place])   [place], [place] T, NP   4 months ago Morbid obesity due to excess calories ([place])   [place], [place] T, NP   7 months ago Seasonal allergic rhinitis due to pollen   [facility], Megan P, DO   11  months ago Bilateral pulmonary embolism ([place])   [place], Barbaraann Faster, NP       Future Appointments             In 3 weeks Cannady, Barbaraann Faster, NP [employer], [facility]   In 4 weeks Arida, Mertie Clause, MD [place], [facility]   In 1 month  [place], [facility]   In 2 months [place], [place] T, NP [employer], [facility]            Signed Prescriptions Disp Refills   furosemide (LASIX) 20 MG tablet 90 tablet 2    Sig: TAKE 1 TABLET BY MOUTH EVERY DAY AS NEEDED FOR EDEMA     Cardiovascular:  Diuretics - Loop Failed - 11/13/2020 10:25 AM      Failed - Last BP in normal range    BP Readings from Last 1 Encounters:  11/15/20 136/74          Passed - K in  normal range and within 360 days    Potassium  Date Value Ref Range Status  11/13/2020 3.0 (L) 3.5 - 5.1 mmol/L Final          Passed - Ca in normal range and within 360 days    Calcium  Date Value Ref Range Status  11/13/2020 9.0 8.9 - 10.3 mg/dL Final          Passed - Na in normal range and within 360 days    Sodium  Date Value Ref Range Status  11/13/2020 141 135 - 145 mmol/L Final  06/23/2020 141 134 - 144 mmol/L Final          Passed - Cr in normal range and within 360 days    Creatinine, Ser  Date Value Ref Range Status  11/13/2020 0.71 0.44 - 1.00 mg/dL Final          Passed - Valid encounter within last 6 months    Recent Outpatient Visits           3 months ago Cellulitis of left lower extremity   [facility] Jon Billings, NP   3 months ago SVT (supraventricular tachycardia) ([place])   [place], Martell T, NP   4 months ago Morbid obesity due to excess calories ([place])   [facility] [place], [place] T, NP   7 months ago Seasonal allergic rhinitis due to pollen   [place], DO   11 months ago Bilateral pulmonary embolism ([place])   [place], Barbaraann Faster, NP       Future Appointments             In 3 weeks Cannady, Barbaraann Faster, NP [employer], [facility]   In 4 weeks Arida, Mertie Clause, MD [place], [facility]   In 1 month  [employer], [place]   In 2 months Carlos, Barbaraann Faster, NP [employer], [facility]

## 2020-11-19 ENCOUNTER — Other Ambulatory Visit: Payer: Self-pay | Admitting: Nurse Practitioner

## 2020-11-19 DIAGNOSIS — H6123 Impacted cerumen, bilateral: Secondary | ICD-10-CM | POA: Diagnosis not present

## 2020-11-19 DIAGNOSIS — R42 Dizziness and giddiness: Secondary | ICD-10-CM | POA: Diagnosis not present

## 2020-11-19 DIAGNOSIS — H9201 Otalgia, right ear: Secondary | ICD-10-CM | POA: Diagnosis not present

## 2020-11-19 DIAGNOSIS — H903 Sensorineural hearing loss, bilateral: Secondary | ICD-10-CM | POA: Diagnosis not present

## 2020-11-19 MED ORDER — FUROSEMIDE 20 MG PO TABS: ORAL_TABLET | ORAL | 2 refills | Status: DC

## 2020-11-25 ENCOUNTER — Encounter: Payer: Self-pay | Admitting: Nurse Practitioner

## 2020-11-25 ENCOUNTER — Ambulatory Visit
Admission: RE | Admit: 2020-11-25 | Discharge: 2020-11-25 | Disposition: A | Discharge status: Home / Self Care | Payer: Medicare Other | Source: Ambulatory Visit | Attending: Nurse Practitioner | Admitting: Nurse Practitioner

## 2020-11-25 ENCOUNTER — Ambulatory Visit: Payer: Self-pay

## 2020-11-25 ENCOUNTER — Ambulatory Visit (INDEPENDENT_AMBULATORY_CARE_PROVIDER_SITE_OTHER): Payer: Medicare Other | Admitting: Nurse Practitioner

## 2020-11-25 ENCOUNTER — Other Ambulatory Visit: Payer: Self-pay

## 2020-11-25 VITALS — BP 138/85 | HR 90 | Temp 97.8°F | Wt 301.0 lb

## 2020-11-25 DIAGNOSIS — I890 Lymphedema, not elsewhere classified: Secondary | ICD-10-CM | POA: Diagnosis not present

## 2020-11-25 DIAGNOSIS — L03116 Cellulitis of left lower limb: Secondary | ICD-10-CM | POA: Insufficient documentation

## 2020-11-25 DIAGNOSIS — L98491 Non-pressure chronic ulcer of skin of other sites limited to breakdown of skin: Secondary | ICD-10-CM | POA: Insufficient documentation

## 2020-11-25 DIAGNOSIS — L97929 Non-pressure chronic ulcer of unspecified part of left lower leg with unspecified severity: Secondary | ICD-10-CM | POA: Diagnosis not present

## 2020-11-25 MED ORDER — MUPIROCIN 2 % EX OINT: 1.0000 "application " | TOPICAL_OINTMENT | Freq: Two times a day (BID) | CUTANEOUS | 0 refills | Status: DC

## 2020-11-25 MED ORDER — CEPHALEXIN 500 MG PO CAPS: 500.0000 mg | ORAL_CAPSULE | Freq: Three times a day (TID) | ORAL | 0 refills | Status: AC

## 2020-11-25 NOTE — Telephone Encounter (Signed)
Pt. Reports she has had cellulitis to left leg. Yesterday had "2 areas that opened up and are weeping fluid." Has pain, swelling. Appointment made for today.    Reason for Disposition  Fever > 100.4 F (38.0 C)  Answer Assessment - Initial Assessment Questions 1. APPEARANCE of BOIL: "What does the boil look like?"      Softball size 2. LOCATION: "Where is the boil located?"      Left lower leg 3. NUMBER: "How many boils are there?"      2 4. SIZE: "How big is the boil?" (e.g., inches, cm; compare to size of a coin or other object)     Softball 5. ONSET: "When did the boil start?"     Yesterday 6. PAIN: "Is there any pain?" If Yes, ask: "How bad is the pain?"   (Scale 1-10; or mild, moderate, severe)     Moderate 7. FEVER: "Do you have a fever?" If Yes, ask: "What is it, how was it measured, and when did it start?"      Low grade 8. SOURCE: "Have you been around anyone with boils or other Staph infections?" "Have you ever had boils before?"     No 9. OTHER SYMPTOMS: "Do you have any other symptoms?" (e.g., shaking chills, weakness, rash elsewhere on body)     Swelling 10. PREGNANCY: "Is there any chance you are pregnant?" "When was your last menstrual period?"       No  Protocols used: Boil (Skin Abscess)-A-AH

## 2020-11-25 NOTE — Assessment & Plan Note (Signed)
Ongoing -- previous healed and current open to LLE with pain and infection presenting.  Scheduled to see vascular on Friday.  Will start Keflex and culture wound today.  Mupirocin ointment sent in for application to wound.  May benefit from [place], will defer to vascular assessment.  Due to pain will obtain imaging today to assess for DVT.  Return in one week.

## 2020-11-25 NOTE — Assessment & Plan Note (Signed)
Ongoing.  Will continue compression hose to wear daily, on during day and off at night -- is offering benefit. Continue collaboration with vascular, may need compression pumps in future.

## 2020-11-25 NOTE — Patient Instructions (Signed)
Go to vascular at 9:15 am on Friday morning   Venous Ulcer A venous ulcer is a shallow sore on your lower leg. Venous ulcer is the most common type of lower leg ulcer. You may have venous ulcers on one leg or on both legs. This condition most often develops around your ankles. This type of ulcer may last for a long time (chronic ulcer) or it may return often (recurrent ulcer). What are the causes? This condition is caused by poor blood flow in your legs. The poor flow causes blood to pool in your legs. This can break the skin, causing an ulcer. What increases the risk? You are more likely to develop this condition if: You are 38 years of age or older. You are female. You are overweight. You are not active. You have had a leg ulcer in the past. You have varicose veins. You have clots in your lower leg veins (deep vein thrombosis). You have inflammation of your leg veins (phlebitis). You have recently been pregnant. You smoke. What are the signs or symptoms? The main symptom of this condition is an open sore near your ankle. Other symptoms may include: Swelling. Thick skin. Fluid coming from the ulcer. Bleeding. Itching. Pain and swelling. This gets worse when you stand up and feels better when you raise your leg. Blotchy skin. Dark skin. How is this treated? This condition may be treated by: Keeping your leg raised (elevated). Wearing a type of bandage or stocking to keep pressure (compression) on the veins of your leg. Taking medicines, including antibiotic medicines. Cleaning your ulcer and removing any dead tissue from the wound. Using bandages and wraps that have medicines in them to cover your ulcer. Closing the wound using a piece of skin taken from another area of your body (graft). Follow these instructions at home: Medicines Take or apply over-the-counter and prescription medicines only as told by your doctor. If you were prescribed an antibiotic medicine, take it as  told by your doctor. Do not stop using the antibiotic even if you start to feel better. Ask your doctor if you should take aspirin before long trips. Wound care Follow instructions from your doctor about how to take care of your wound. Make sure you: Wash your hands with soap and water before and after you change your bandage (dressing). If you cannot use soap and water, use hand sanitizer. Change your bandage as told by your doctor. If you had a skin graft, leave stitches (sutures) in place. These may need to stay in place for 2 weeks or longer. Ask when you should remove your bandage. If your bandage is dry and sticks to your leg when you try to remove it, moisten or wet the bandage with saline solution or water to make it easier to remove. Once your bandage is off, check your wound each day for signs of infection. Have a caregiver do this for you if you are not able to do it yourself. Check for: More redness, swelling, or pain. More fluid or blood. Warmth. Pus or a bad smell. Activity Do not sit for a long time without moving. Get up to take short walks every 1-2 hours. This is important. Ask for help if you feel weak or unsteady. Ask your doctor what level of activity is safe for you. Rest with your legs raised during the day. If you can, keep your legs above the level of your heart for 30 minutes, 3-4 times a day, or as told by  your doctor. Do not sit with your legs crossed. General instructions  Wear elastic stockings, compression stockings, or support hose as told by your doctor. Raise the foot of your bed as told by your doctor. Do not use any products that contain nicotine or tobacco, such as cigarettes, e-cigarettes, and chewing tobacco. If you need help quitting, ask your doctor. Keep all follow-up visits as told by your doctor. This is important. Contact a doctor if: Your ulcer is getting larger or is not healing. Your pain gets worse. Get help right away if: You have more  redness, swelling, or pain around your ulcer. You have more fluid or blood coming from your ulcer. Your ulcer feels warm to the touch. You have pus or a bad smell coming from your ulcer. You have a fever. Summary A venous ulcer is a shallow sore on your lower leg. Follow instructions from your doctor about how to take care of your wound. Check your wound each day for signs of infection. Take over-the-counter and prescription medicines only as told by your doctor. Keep all follow-up visits as told by your doctor. This is important. This information is not intended to replace advice given to you by your health care provider. Make sure you discuss any questions you have with your health care provider. Document Revised: 09/14/2017 Document Reviewed: 09/14/2017 Elsevier Patient Education  [place].

## 2020-11-25 NOTE — Progress Notes (Signed)
BP 138/85   Pulse 90   Temp 97.8 F (36.6 C) (Oral)   Wt (!) 301 lb (136.5 kg)   LMP  (LMP Unknown)   SpO2 95%   BMI 53.32 kg/m    Subjective:    Patient ID: Jasmine Webb, female    DOB: March 10, 1955, 65 y.o.   MRN: [id number]  HPI: Jasmine Webb is a 65 y.o. female  Chief Complaint  Patient presents with   Rash    Patient states her currently weeping and states she thinks it may be 2 new areas. Patient states she thought she had got it to stop weeping, but the old wound has scabbed over. Patient states she is in pain currently. Patient states she experiencing sharp pains in left leg. Patient states her right leg is fine.    Immunizations    Patient states she is interested in having the Pneumonia and Shingles vaccine, but wants to wait as she is having weeping in her left currently. Patient states she would like to discuss with provider.    IMMUNIZATIONS == interested in having shingles and PP, but not now due to acute issue.  SKIN INFECTION Last treated for cellulitis of LLE on 08/20/20 -- reports this area has started to get worse, started a day or two ago weeping more.  Having discomfort with sharp pains to LLE, started last night.  Was treated with Bactrim in July.  She reports opening to posterior left leg -- one is healed and the other just started.  Has underlying lymphedema -- has been seen by vascular for PE and lymphedema -- 11/29/19. Duration: months Location: LLE History of trauma in area: no Pain: yes Quality: yes Severity: 8/10 Redness: yes Swelling: yes Oozing: yes Pus: no Fevers:  x 1 at home Nausea/vomiting: no Status: worse Treatments attempted:none  Tetanus: UTD   Relevant past medical, surgical, family and social history reviewed and updated as indicated. Interim medical history since our last visit reviewed. Allergies and medications reviewed and updated.  Review of Systems  Constitutional:  Negative for activity change, appetite change,  diaphoresis, fatigue and fever.  Respiratory:  Negative for cough, chest tightness and shortness of breath.   Cardiovascular:  Negative for chest pain, palpitations and leg swelling.  Gastrointestinal: Negative.   Endocrine: Negative for cold intolerance, heat intolerance, polydipsia, polyphagia and polyuria.  Skin:  Positive for wound.  Neurological: Negative.   Psychiatric/Behavioral: Negative.     Per HPI unless specifically indicated above     Objective:    BP 138/85   Pulse 90   Temp 97.8 F (36.6 C) (Oral)   Wt (!) 301 lb (136.5 kg)   LMP  (LMP Unknown)   SpO2 95%   BMI 53.32 kg/m   Wt Readings from Last 3 Encounters:  11/25/20 (!) 301 lb (136.5 kg)  11/15/20 (!) 306 lb (138.8 kg)  08/20/20 (!) 306 lb (138.8 kg)    Physical Exam Vitals and nursing note reviewed.  Constitutional:      General: She is awake. She is not in acute distress.    Appearance: She is well-developed and well-groomed. She is morbidly obese. She is not ill-appearing.  HENT:     Head: Normocephalic.     Right Ear: Hearing normal.     Left Ear: Hearing normal.  Eyes:     General: Lids are normal.        Right eye: No discharge.        Left eye: No discharge.  Conjunctiva/sclera: Conjunctivae normal.     Pupils: Pupils are equal, round, and reactive to light.  Neck:     Vascular: No carotid bruit.  Cardiovascular:     Rate and Rhythm: Normal rate and regular rhythm.     Heart sounds: Normal heart sounds. No murmur heard.   No gallop.     Comments: Negative [place] Pulmonary:     Effort: Pulmonary effort is normal. No accessory muscle usage or respiratory distress.     Breath sounds: Normal breath sounds.  Abdominal:     General: Bowel sounds are normal.     Palpations: Abdomen is soft.  Musculoskeletal:     Cervical back: Normal range of motion and neck supple.     Right lower leg: 2+ Edema present.     Left lower leg: 2+ Edema present.  Skin:    General: Skin is warm and dry.        Neurological:     Mental Status: She is alert and oriented to person, place, and time.  Psychiatric:        Attention and Perception: Attention normal.        Mood and Affect: Mood normal.        Speech: Speech normal.        Behavior: Behavior normal. Behavior is cooperative.        Thought Content: Thought content normal.    Results for orders placed or performed during the hospital encounter of 11/13/20  Resp Panel by RT-PCR (Flu A&B, Covid) Nasopharyngeal Swab   Specimen: Nasopharyngeal Swab; Nasopharyngeal(NP) swabs in vial transport medium  Result Value Ref Range   SARS Coronavirus 2 by RT PCR NEGATIVE NEGATIVE   Influenza A by PCR NEGATIVE NEGATIVE   Influenza B by PCR NEGATIVE NEGATIVE  CBC with Differential  Result Value Ref Range   WBC 9.5 4.0 - 10.5 K/uL   RBC 4.24 3.87 - 5.11 MIL/uL   Hemoglobin 12.4 12.0 - 15.0 g/dL   HCT 36.6 36.0 - 46.0 %   MCV 86.3 80.0 - 100.0 fL   MCH 29.2 26.0 - 34.0 pg   MCHC 33.9 30.0 - 36.0 g/dL   RDW 13.2 11.5 - 15.5 %   Platelets 217 150 - 400 K/uL   nRBC 0.0 0.0 - 0.2 %   Neutrophils Relative % 67 %   Neutro Abs 6.4 1.7 - 7.7 K/uL   Lymphocytes Relative 22 %   Lymphs Abs 2.1 0.7 - 4.0 K/uL   Monocytes Relative 8 %   Monocytes Absolute 0.7 0.1 - 1.0 K/uL   Eosinophils Relative 2 %   Eosinophils Absolute 0.2 0.0 - 0.5 K/uL   Basophils Relative 0 %   Basophils Absolute 0.0 0.0 - 0.1 K/uL   Immature Granulocytes 1 %   Abs Immature Granulocytes 0.08 (H) 0.00 - 0.07 K/uL  Basic metabolic panel  Result Value Ref Range   Sodium 141 135 - 145 mmol/L   Potassium 3.0 (L) 3.5 - 5.1 mmol/L   Chloride 102 98 - 111 mmol/L   CO2 30 22 - 32 mmol/L   Glucose, Bld 122 (H) 70 - 99 mg/dL   BUN 14 8 - 23 mg/dL   Creatinine, Ser 0.71 0.44 - 1.00 mg/dL   Calcium 9.0 8.9 - 10.3 mg/dL   GFR, Estimated >60 >60 mL/min   Anion gap 9 5 - 15      Assessment & Plan:   Problem List Items Addressed This Visit  Musculoskeletal and  Integument   Chronic skin ulcer, limited to breakdown of skin ([place]) - Primary    Ongoing -- previous healed and current open to LLE with pain and infection presenting.  Scheduled to see vascular on Friday.  Will start Keflex and culture wound today.  Mupirocin ointment sent in for application to wound.  May benefit from [place], will defer to vascular assessment.  Due to pain will obtain imaging today to assess for DVT.  Return in one week.      Relevant Orders   Wound culture     Other   Morbid obesity due to excess calories (HCC)    BMI 53.32, has gained some weight back post Covid.  Recommended eating smaller high protein, low fat meals more frequently and exercising 30 mins a day 5 times a week with a goal of 10-15lb weight loss in the next 3 months. Patient voiced their understanding and motivation to adhere to these recommendations.       Lymphedema    Ongoing.  Will continue compression hose to wear daily, on during day and off at night -- is offering benefit. Continue collaboration with vascular, may need compression pumps in future.      Cellulitis of left lower extremity    Refer to skin ulcer plan.      Relevant Orders   US Venous Img Lower Unilateral Left (DVT)     Follow up plan: Return in about 1 week (around 12/02/2020) for Wound check.

## 2020-11-25 NOTE — Assessment & Plan Note (Signed)
BMI 53.32, has gained some weight back post Covid.  Recommended eating smaller high protein, low fat meals more frequently and exercising 30 mins a day 5 times a week with a goal of 10-15lb weight loss in the next 3 months. Patient voiced their understanding and motivation to adhere to these recommendations.

## 2020-11-25 NOTE — Progress Notes (Signed)
Left general HIPPA compliant message alerting of no DVT.

## 2020-11-25 NOTE — Assessment & Plan Note (Signed)
Refer to skin ulcer plan.

## 2020-11-26 DIAGNOSIS — R42 Dizziness and giddiness: Secondary | ICD-10-CM | POA: Diagnosis not present

## 2020-11-27 ENCOUNTER — Encounter (INDEPENDENT_AMBULATORY_CARE_PROVIDER_SITE_OTHER): Payer: Self-pay | Admitting: Vascular Surgery

## 2020-11-27 ENCOUNTER — Ambulatory Visit (INDEPENDENT_AMBULATORY_CARE_PROVIDER_SITE_OTHER): Payer: Medicare Other | Admitting: Vascular Surgery

## 2020-11-27 ENCOUNTER — Other Ambulatory Visit: Payer: Self-pay

## 2020-11-27 VITALS — BP 130/79 | HR 85 | Resp 16 | Wt 302.0 lb

## 2020-11-27 DIAGNOSIS — L97221 Non-pressure chronic ulcer of left calf limited to breakdown of skin: Secondary | ICD-10-CM | POA: Diagnosis not present

## 2020-11-27 DIAGNOSIS — I890 Lymphedema, not elsewhere classified: Secondary | ICD-10-CM | POA: Diagnosis not present

## 2020-11-27 NOTE — Progress Notes (Signed)
Patient ID: Jasmine Webb, female   DOB: 01/24/1956, 64 y.o.   MRN: [id number]  Chief Complaint  Patient presents with   Follow-up    Ref Cannady lymphedema,left leg wound with weeping    HPI Jasmine Webb is a 65 y.o. female.  I am asked to see the patient by J. Cannady for evaluation of swelling and ulceration of the lower extremities.  Over the past week or 2 the patient has developed a left leg ulceration.  This is on the medial left lower leg.  This is associated with significant swelling despite the use of compression socks every day and trying to elevate her legs and walk.  She has a previous history of DVT and PE over a year ago.  No recent fevers or chills although she has been on antibiotics.  This area is somewhat painful..     Past Medical History:  Diagnosis Date   Arthritis    Hyperlipidemia    Hypertension    Tumor    breast and pelvic   Vertigo     Past Surgical History:  Procedure Laterality Date   ABDOMINAL HYSTERECTOMY  2003   BREAST SURGERY     PULMONARY THROMBECTOMY N/A 10/10/2019   Procedure: PULMONARY THROMBECTOMY;  Surgeon: Algernon Huxley, MD;  Location: [facility];  Service: Cardiovascular;  Laterality: N/A;     Family History  Problem Relation Age of Onset   COPD Mother    ALS Father    Heart disease Sister    Breast cancer Sister    Colon cancer Sister    Diabetes Brother    Hypertension Brother    Asthma Son    Sinusitis Son    Congestive Heart Failure Maternal Uncle       Social History   Tobacco Use   Smoking status: Never   Smokeless tobacco: Never  Vaping Use   Vaping Use: Never used  Substance Use Topics   Alcohol use: Never   Drug use: Never     Allergies  Allergen Reactions   Ibuprofen     Told to avoid ibuprofen related products by doctor.    Current Outpatient Medications  Medication Sig Dispense Refill   acetaminophen (TYLENOL) 500 MG tablet Take 500 mg by mouth every 6 (six) hours as needed.      Ascorbic Acid (VITAMIN C) 500 MG CAPS Take by mouth.     atorvastatin (LIPITOR) 40 MG tablet Take 1 tablet (40 mg total) by mouth daily. 90 tablet 4   cephALEXin (KEFLEX) 500 MG capsule Take 1 capsule (500 mg total) by mouth 3 (three) times daily for 5 days. 15 capsule 0   ELIQUIS 5 MG TABS tablet TAKE 1 TABLET BY MOUTH TWICE A DAY 180 tablet 0   FIBER PO Take by mouth daily.     fluticasone (FLONASE) 50 MCG/ACT nasal spray Place 2 sprays into both nostrils daily.     furosemide (LASIX) 20 MG tablet TAKE 1 TABLET BY MOUTH TWICE A DAY AS NEEDED FOR EDEMA 180 tablet 2   levocetirizine (XYZAL) 5 MG tablet TAKE 1 TABLET BY MOUTH EVERY DAY IN THE EVENING 90 tablet 1   losartan (COZAAR) 100 MG tablet Take 1 tablet (100 mg total) by mouth daily. 90 tablet 4   metoprolol tartrate (LOPRESSOR) 25 MG tablet TAKE 1 TABLET BY MOUTH TWICE A DAY 60 tablet 5   mupirocin ointment (BACTROBAN) 2 % Apply 1 application topically 2 (two) times daily. [place]  g 0   VITAMIN D PO Take 1,000 Units by mouth daily.      No current facility-administered medications for this visit.    REVIEW OF SYSTEMS (Negative unless checked)   Constitutional: [] Weight loss  [] Fever  [] Chills Cardiac: [] Chest pain   [] Chest pressure   [] Palpitations   [] Shortness of breath when laying flat   [] Shortness of breath at rest   [x] Shortness of breath with exertion. Vascular:  [] Pain in legs with walking   [] Pain in legs at rest   [] Pain in legs when laying flat   [] Claudication   [] Pain in feet when walking  [] Pain in feet at rest  [] Pain in feet when laying flat   [x] History of DVT   [x] Phlebitis   [x] Swelling in legs   [] Varicose veins   [x] Non-healing ulcers Pulmonary:   [] Uses home oxygen   [] Productive cough   [] Hemoptysis   [] Wheeze  [] COPD   [] Asthma Neurologic:  [] Dizziness  [] Blackouts   [] Seizures   [] History of stroke   [] History of TIA  [] Aphasia   [] Temporary blindness   [] Dysphagia   [] Weakness or numbness in arms   [] Weakness or  numbness in legs Musculoskeletal:  [x] Arthritis   [] Joint swelling   [] Joint pain   [] Low back pain Hematologic:  [] Easy bruising  [] Easy bleeding   [] Hypercoagulable state   [] Anemic   Gastrointestinal:  [] Blood in stool   [] Vomiting blood  [] Gastroesophageal reflux/heartburn   [] Abdominal pain Genitourinary:  [] Chronic kidney disease   [] Difficult urination  [] Frequent urination  [] Burning with urination   [] Hematuria Skin:  [] Rashes   [x] Ulcers   [x] Wounds Psychological:  [] History of anxiety   []  History of major depression.    Physical Exam BP 130/79 (BP Location: Left Arm)   Pulse 85   Resp 16   Wt (!) 302 lb (137 kg)   LMP  (LMP Unknown)   BMI 53.50 kg/m  Gen:  WD/WN, NAD.  Morbidly obese Head: [place]/AT, No temporalis wasting. Ear/Nose/Throat: Hearing grossly intact, nares w/o erythema or drainage, oropharynx w/o Erythema/Exudate Eyes: Conjunctiva clear, sclera non-icteric  Neck: trachea midline.  No JVD.  Pulmonary:  Good air movement, respirations not labored, no use of accessory muscles  Cardiac: RRR, no JVD Vascular:  Vessel Right Left  Radial Palpable Palpable                                   Gastrointestinal:. No masses, surgical incisions, or scars. Musculoskeletal: M/S 5/5 throughout.  Extremities without ischemic changes.  No deformity or atrophy.  Superficial quarter sized ulceration on the left medial lower calf area.  1+ right lower extremity edema, 2+ left lower extremity edema. Neurologic: Sensation grossly intact in extremities.  Symmetrical.  Speech is fluent. Motor exam as listed above. Psychiatric: Judgment intact, Mood & affect appropriate for pt's clinical situation. Dermatologic: No rashes or ulcers noted.  No cellulitis or open wounds.    Radiology CT HEAD WO CONTRAST (5MM)  Result Date: 11/13/2020 CLINICAL DATA:  Dizziness EXAM: CT HEAD WITHOUT CONTRAST TECHNIQUE: Contiguous axial images were obtained from the base of the skull through the  vertex without intravenous contrast. COMPARISON:  None. FINDINGS: Brain: No evidence of acute infarction, hemorrhage, hydrocephalus, extra-axial collection or mass lesion/mass effect. Benign cystic lesion beneath the right frontal bone (coronal image 24) reflects an arachnoid cyst. Vascular: No hyperdense vessel or unexpected calcification. Skull: Normal. Negative for fracture or  focal lesion. Sinuses/Orbits: The visualized paranasal sinuses are essentially clear. The mastoid air cells are unopacified. Other: None. IMPRESSION: Benign arachnoid cyst. No evidence of acute intracranial abnormality. Electronically Signed   By: Julian Hy M.D.   On: 11/13/2020 21:41   US Venous Img Lower Unilateral Left (DVT)  Result Date: 11/25/2020 CLINICAL DATA:  pain to left lower extremity with ulcer EXAM: LEFT LOWER EXTREMITY VENOUS DOPPLER ULTRASOUND TECHNIQUE: Gray-scale sonography with graded compression, as well as color Doppler and duplex ultrasound were performed to evaluate the lower extremity deep venous systems from the level of the common femoral vein and including the common femoral, femoral, profunda femoral, popliteal and calf veins including the posterior tibial, peroneal and gastrocnemius veins when visible. The superficial great saphenous vein was also interrogated. Spectral Doppler was utilized to evaluate flow at rest and with distal augmentation maneuvers in the common femoral, femoral and popliteal veins. COMPARISON:  None. FINDINGS: Contralateral Common Femoral Vein: Respiratory phasicity is normal and symmetric with the symptomatic side. No evidence of thrombus. Normal compressibility. Common Femoral Vein: No evidence of thrombus. Normal compressibility, respiratory phasicity and response to augmentation. Saphenofemoral Junction: No evidence of thrombus. Normal compressibility and flow on color Doppler imaging. Profunda Femoral Vein: No evidence of thrombus. Normal compressibility and flow on color  Doppler imaging. Femoral Vein: No evidence of thrombus. Normal compressibility, respiratory phasicity and response to augmentation. Popliteal Vein: No evidence of thrombus. Normal compressibility, respiratory phasicity and response to augmentation. Calf Veins: No evidence of thrombus. Normal compressibility and flow on color Doppler imaging. Other Findings:  None. IMPRESSION: No evidence of deep venous thrombosis. Electronically Signed   By: Albin Felling M.D.   On: 11/25/2020 16:41    Labs Recent Results (from the past 2160 hour(s))  CBC with Differential     Status: Abnormal   Collection Time: 11/13/20  8:52 PM  Result Value Ref Range   WBC 9.5 4.0 - 10.5 K/uL   RBC 4.24 3.87 - 5.11 MIL/uL   Hemoglobin 12.4 12.0 - 15.0 g/dL   HCT 36.6 36.0 - 46.0 %   MCV 86.3 80.0 - 100.0 fL   MCH 29.2 26.0 - 34.0 pg   MCHC 33.9 30.0 - 36.0 g/dL   RDW 13.2 11.5 - 15.5 %   Platelets 217 150 - 400 K/uL   nRBC 0.0 0.0 - 0.2 %   Neutrophils Relative % 67 %   Neutro Abs 6.4 1.7 - 7.7 K/uL   Lymphocytes Relative 22 %   Lymphs Abs 2.1 0.7 - 4.0 K/uL   Monocytes Relative 8 %   Monocytes Absolute 0.7 0.1 - 1.0 K/uL   Eosinophils Relative 2 %   Eosinophils Absolute 0.2 0.0 - 0.5 K/uL   Basophils Relative 0 %   Basophils Absolute 0.0 0.0 - 0.1 K/uL   Immature Granulocytes 1 %   Abs Immature Granulocytes 0.08 (H) 0.00 - 0.07 K/uL    Comment: Performed at [facility], [address], [place], [place] [address]  Basic metabolic panel     Status: Abnormal   Collection Time: 11/13/20  8:52 PM  Result Value Ref Range   Sodium 141 135 - 145 mmol/L   Potassium 3.0 (L) 3.5 - 5.1 mmol/L   Chloride 102 98 - 111 mmol/L   CO2 30 22 - 32 mmol/L   Glucose, Bld 122 (H) 70 - 99 mg/dL    Comment: Glucose reference range applies only to samples taken after fasting for at least 8 hours.  BUN 14 8 - 23 mg/dL   Creatinine, Ser 0.71 0.44 - 1.00 mg/dL   Calcium 9.0 8.9 - 10.3 mg/dL   GFR, Estimated >60 >60  mL/min    Comment: (NOTE) Calculated using the CKD-EPI Creatinine Equation (2021)    Anion gap 9 5 - 15    Comment: Performed at [facility], [address] [place], [place] [address]  Resp Panel by RT-PCR (Flu A&B, Covid) Nasopharyngeal Swab     Status: None   Collection Time: 11/13/20 10:39 PM   Specimen: Nasopharyngeal Swab; Nasopharyngeal(NP) swabs in vial transport medium  Result Value Ref Range   SARS Coronavirus 2 by RT PCR NEGATIVE NEGATIVE    Comment: (NOTE) SARS-CoV-2 target nucleic acids are NOT DETECTED.  The SARS-CoV-2 RNA is generally detectable in upper respiratory specimens during the acute phase of infection. The lowest concentration of SARS-CoV-2 viral copies this assay can detect is 138 copies/mL. A negative result does not preclude SARS-Cov-2 infection and should not be used as the sole basis for treatment or other patient management decisions. A negative result may occur with  improper specimen collection/handling, submission of specimen other than nasopharyngeal swab, presence of viral mutation(s) within the areas targeted by this assay, and inadequate number of viral copies(<138 copies/mL). A negative result must be combined with clinical observations, patient history, and epidemiological information. The expected result is Negative.  Fact Sheet for Patients:  EntrepreneurPulse.com.au  Fact Sheet for Healthcare Providers:  IncredibleEmployment.be  This test is no t yet approved or cleared by the [place] FDA and  has been authorized for detection and/or diagnosis of SARS-CoV-2 by FDA under an Emergency Use Authorization (EUA). This EUA will remain  in effect (meaning this test can be used) for the duration of the COVID-19 declaration under Section 564(b)(1) of the Act, 21 U.S.C.section 360bbb-3(b)(1), unless the authorization is terminated  or revoked sooner.       Influenza A by PCR NEGATIVE  NEGATIVE   Influenza B by PCR NEGATIVE NEGATIVE    Comment: (NOTE) The Xpert Xpress SARS-CoV-2/FLU/RSV plus assay is intended as an aid in the diagnosis of influenza from Nasopharyngeal swab specimens and should not be used as a sole basis for treatment. Nasal washings and aspirates are unacceptable for Xpert Xpress SARS-CoV-2/FLU/RSV testing.  Fact Sheet for Patients: EntrepreneurPulse.com.au  Fact Sheet for Healthcare Providers: IncredibleEmployment.be  This test is not yet approved or cleared by the [place] FDA and has been authorized for detection and/or diagnosis of SARS-CoV-2 by FDA under an Emergency Use Authorization (EUA). This EUA will remain in effect (meaning this test can be used) for the duration of the COVID-19 declaration under Section 564(b)(1) of the Act, 21 U.S.C. section 360bbb-3(b)(1), unless the authorization is terminated or revoked.  Performed at [facility], [address] [place], [place] [address]     Assessment/Plan: Bilateral pulmonary embolism [facility]) Status post thrombectomy.  Clinically doing very well.  No significant residual symptoms.  Tolerating anticoagulation.   Essential hypertension blood pressure control important in reducing the progression of atherosclerotic disease. On appropriate oral medications.  Lower limb ulcer, calf, left, limited to breakdown of skin (HCC) 3 layer Unna boot was placed today and will be changed weekly for 3 to 4 weeks.  We will reassess the wound at that time and hopefully be able to come out of Unna boots and go back to compression socks.  Lymphedema The patient has developed severe lymphedema from chronic scarring and lymphatic channels.  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  At this point, compression and elevation alone have not really helped her symptoms.  She has developed an  ulceration and weeping of the tissue which would indicate a stage III lymphedema.  She should get a lymphedema pump as an adjuvant therapy to try to help control her symptoms.  We are wrapping her left leg in an Unna boot today and she will continue to wear compression sock on the right.      Leotis Pain 11/27/2020, 9:58 AM   This note was created with Dragon medical transcription system.  Any errors from dictation are unintentional.

## 2020-11-27 NOTE — Assessment & Plan Note (Signed)
3 layer Unna boot was placed today and will be changed weekly for 3 to 4 weeks.  We will reassess the wound at that time and hopefully be able to come out of Unna boots and go back to compression socks.

## 2020-11-27 NOTE — Assessment & Plan Note (Signed)
The patient has developed severe lymphedema from chronic scarring and lymphatic channels.  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  At this point, compression and elevation alone have not really helped her symptoms.  She has developed an ulceration and weeping of the tissue which would indicate a stage III lymphedema.  She should get a lymphedema pump as an adjuvant therapy to try to help control her symptoms.  We are wrapping her left leg in an Unna boot today and she will continue to wear compression sock on the right.

## 2020-12-02 ENCOUNTER — Ambulatory Visit: Payer: Medicare Other | Admitting: Nurse Practitioner

## 2020-12-04 ENCOUNTER — Other Ambulatory Visit: Payer: Self-pay

## 2020-12-04 ENCOUNTER — Encounter (INDEPENDENT_AMBULATORY_CARE_PROVIDER_SITE_OTHER): Payer: Self-pay | Admitting: Nurse Practitioner

## 2020-12-04 ENCOUNTER — Ambulatory Visit (INDEPENDENT_AMBULATORY_CARE_PROVIDER_SITE_OTHER): Payer: Medicare Other | Admitting: Nurse Practitioner

## 2020-12-04 VITALS — BP 98/68 | HR 123 | Ht 63.0 in | Wt 302.0 lb

## 2020-12-04 DIAGNOSIS — L97221 Non-pressure chronic ulcer of left calf limited to breakdown of skin: Secondary | ICD-10-CM

## 2020-12-04 LAB — WOUND CULTURE

## 2020-12-04 NOTE — Progress Notes (Signed)
Good morning, please let Jasmine Webb know that her wound culture returned and appears Keflex which she is taking is susceptible to the bacteria present to wound site.  Continue this and visits with vascular.  If any questions, let me know:) Keep being awesome!!  Thank you for allowing me to participate in your care.  I appreciate you. Kindest regards, Henrine Screws '

## 2020-12-04 NOTE — Progress Notes (Signed)
History of Present Illness  There is no documented history at this time  Assessments & Plan   There are no diagnoses linked to this encounter.    Additional instructions  Subjective:  Patient presents with venous ulcer of the Left lower extremity.    Procedure:  3 layer unna wrap was placed Left lower extremity.   Plan:   Follow up in one week.  

## 2020-12-11 ENCOUNTER — Other Ambulatory Visit: Payer: Self-pay

## 2020-12-11 ENCOUNTER — Ambulatory Visit (INDEPENDENT_AMBULATORY_CARE_PROVIDER_SITE_OTHER): Payer: Medicare Other | Admitting: Nurse Practitioner

## 2020-12-11 ENCOUNTER — Encounter (INDEPENDENT_AMBULATORY_CARE_PROVIDER_SITE_OTHER): Payer: Self-pay | Admitting: Nurse Practitioner

## 2020-12-11 VITALS — BP 104/77 | HR 121 | Ht 63.0 in | Wt 298.0 lb

## 2020-12-11 DIAGNOSIS — L97221 Non-pressure chronic ulcer of left calf limited to breakdown of skin: Secondary | ICD-10-CM

## 2020-12-11 NOTE — Progress Notes (Signed)
History of Present Illness  There is no documented history at this time  Assessments & Plan   There are no diagnoses linked to this encounter.    Additional instructions  Subjective:  Patient presents with venous ulcer of the Left lower extremity.    Procedure:  3 layer unna wrap was placed Left lower extremity.   Plan:   Follow up in one week.  

## 2020-12-14 ENCOUNTER — Inpatient Hospital Stay: Payer: Medicare Other | Admitting: Nurse Practitioner

## 2020-12-17 ENCOUNTER — Ambulatory Visit: Payer: Medicare Other | Admitting: Cardiovascular Disease

## 2020-12-17 ENCOUNTER — Encounter: Payer: Self-pay | Admitting: Cardiovascular Disease

## 2020-12-17 ENCOUNTER — Other Ambulatory Visit: Payer: Self-pay

## 2020-12-17 VITALS — BP 110/70 | HR 62 | Ht 63.0 in | Wt 301.1 lb

## 2020-12-17 DIAGNOSIS — E785 Hyperlipidemia, unspecified: Secondary | ICD-10-CM

## 2020-12-17 DIAGNOSIS — I471 Supraventricular tachycardia: Secondary | ICD-10-CM

## 2020-12-17 DIAGNOSIS — I10 Essential (primary) hypertension: Secondary | ICD-10-CM | POA: Diagnosis not present

## 2020-12-17 DIAGNOSIS — Z86711 Personal history of pulmonary embolism: Secondary | ICD-10-CM | POA: Diagnosis not present

## 2020-12-17 NOTE — Progress Notes (Signed)
Cardiology Office Note   Date:  12/17/2020   ID:  Jasmine Webb, DOB Mar 28, 1955, MRN [id number]  PCP:  Jasmine Lick, NP  Cardiologist:   Jasmine Sacramento, MD   Chief Complaint  Patient presents with   Other    4 Month f/u no complaints today. Meds reviewed verbally with pt.       History of Present Illness: Jasmine Webb is a 64 y.o. female who is here today for a follow-up visit regarding palpitations due to suspected paroxysmal supraventricular tachycardia. She has past medical history of essential hypertension, pulmonary embolism, hyperlipidemia, obesity .  She was hospitalized in September of last year with shortness of breath and chest pain with mildly elevated troponin.  She tested positive for COVID.  CT angiogram showed sub-massive pulmonary embolism with right ventricular strain.  She underwent catheter-based thrombolysis with thrombectomy.  She was anticoagulated with Eliquis. Echocardiogram in September 2021 showed an EF of 55 to 01%, grade 2 diastolic dysfunction and normal RV function.  The patient had intermittent palpitations with mild dizziness. A 7-day ZIO monitor  showed multiple short runs of SVT the longest lasted 10 minutes and 33 seconds with frequent PACs and rare PVCs.  Based on these results, she was started on metoprolol tartrate 25 mg twice daily.  Echocardiogram was done in July and showed an EF of 60 to 74%, grade 1 diastolic dysfunction, borderline pulmonary hypertension with an estimated RSVP of 35 mmHg, mild mitral regurgitation and mild to moderate tricuspid regurgitation.  Carlton Adam Myoview was also done in July which showed no evidence of ischemia with normal ejection fraction.  She has been doing reasonably well overall with minimal palpitations.  She reports stable exertional dyspnea.  She continues to struggle with leg edema and venous ulceration.  She follows at the vascular clinic and her left leg is wrapped with Unna boots.   Past Medical  History:  Diagnosis Date   Arthritis    Hyperlipidemia    Hypertension    Tumor    breast and pelvic   Vertigo     Past Surgical History:  Procedure Laterality Date   ABDOMINAL HYSTERECTOMY  2003   BREAST SURGERY     PULMONARY THROMBECTOMY N/A 10/10/2019   Procedure: PULMONARY THROMBECTOMY;  Surgeon: Jasmine Huxley, MD;  Location: [facility];  Service: Cardiovascular;  Laterality: N/A;     Current Outpatient Medications  Medication Sig Dispense Refill   acetaminophen (TYLENOL) 500 MG tablet Take 500 mg by mouth every 6 (six) hours as needed.     atorvastatin (LIPITOR) 40 MG tablet Take 1 tablet (40 mg total) by mouth daily. 90 tablet 4   ELIQUIS 5 MG TABS tablet TAKE 1 TABLET BY MOUTH TWICE A DAY 180 tablet 0   FIBER PO Take by mouth daily.     fluticasone (FLONASE) 50 MCG/ACT nasal spray Place 2 sprays into both nostrils daily.     furosemide (LASIX) 20 MG tablet TAKE 1 TABLET BY MOUTH TWICE A DAY AS NEEDED FOR EDEMA 180 tablet 2   levocetirizine (XYZAL) 5 MG tablet TAKE 1 TABLET BY MOUTH EVERY DAY IN THE EVENING 90 tablet 1   losartan (COZAAR) 100 MG tablet Take 1 tablet (100 mg total) by mouth daily. 90 tablet 4   metoprolol tartrate (LOPRESSOR) 25 MG tablet TAKE 1 TABLET BY MOUTH TWICE A DAY 60 tablet 5   Multiple Vitamins-Minerals (MULTIVITAMIN GUMMIES ADULT PO) Take by mouth daily.     No  current facility-administered medications for this visit.    Allergies:   Ibuprofen    Social History:  The patient  reports that she has never smoked. She has never used smokeless tobacco. She reports that she does not drink alcohol and does not use drugs.   Family History:  The patient's family history includes ALS in her father; Asthma in her son; Breast cancer in her sister; COPD in her mother; Colon cancer in her sister; Congestive Heart Failure in her maternal uncle; Diabetes in her brother; Heart disease in her sister; Hypertension in her brother; Sinusitis in her son.     ROS:  Please see the history of present illness.   Otherwise, review of systems are positive for none.   All other systems are reviewed and negative.    PHYSICAL EXAM: VS:  BP 110/70 (BP Location: Right Arm, Patient Position: Sitting, Cuff Size: Large)   Pulse 62   Ht 5\' 3"  (1.6 m)   Wt (!) 301 lb 2 oz (136.6 kg)   LMP  (LMP Unknown)   SpO2 98%   BMI 53.34 kg/m  , BMI Body mass index is 53.34 kg/m. GEN: Well nourished, well developed, in no acute distress  HEENT: normal  Neck: no JVD, carotid bruits, or masses Cardiac: RRR; no  rubs, or gallops, mild bilateral leg edema.  2 /6 systolic murmur in the aortic area which is early peaking. Respiratory:  clear to auscultation bilaterally, normal work of breathing GI: soft, nontender, nondistended, + BS MS: no deformity or atrophy  Skin: warm and dry, no rash Neuro:  Strength and sensation are intact Psych: euthymic mood, full affect   EKG:  EKG is ordered today. The ekg ordered today demonstrates normal sinus rhythm with PACs.   Recent Labs: 06/23/2020: ALT 15; TSH 3.140 11/13/2020: BUN 14; Creatinine, Ser 0.71; Hemoglobin 12.4; Platelets 217; Potassium 3.0; Sodium 141    Lipid Panel    Component Value Date/Time   CHOL 138 06/23/2020 1115   TRIG 114 06/23/2020 1115   HDL 50 06/23/2020 1115   CHOLHDL 3.9 10/10/2019 0851   VLDL 14 10/10/2019 0851   LDLCALC 67 06/23/2020 1115      Wt Readings from Last 3 Encounters:  12/17/20 (!) 301 lb 2 oz (136.6 kg)  12/11/20 298 lb (135.2 kg)  12/04/20 (!) 302 lb (137 kg)        PAD Screen 06/26/2020  Previous PAD dx? No  Previous surgical procedure? No  Pain with walking? No  Feet/toe relief with dangling? No  Painful, non-healing ulcers? No  Extremities discolored? No      ASSESSMENT AND PLAN:  1.  PSVT: Her symptoms are presently controlled on metoprolol 25 mg twice daily.   She does have PACs but these are overall benign.  2.  Exertional dyspnea: Echocardiogram  showed normal LV systolic function and minimal pulmonary hypertension.  Lexiscan Myoview in July showed no evidence of ischemia.  No further cardiac testing is planned this time.  3.  Essential hypertension: Blood pressure is controlled on current medication.  4.  Hyperlipidemia: Currently on atorvastatin 40 mg daily .  I reviewed most recent lipid profile which showed an LDL of 67.  5.  History of submassive pulmonary embolism in the setting of COVID infection in September 2021: She continues to have significant leg edema and I suspect that she is at high risk for recurrent thromboembolic events.  Consider lifelong anticoagulation.    Disposition:   FU with me in  12 months.  Signed,  Jasmine Sacramento, MD  12/17/2020 9:12 AM    [place]

## 2020-12-17 NOTE — Patient Instructions (Addendum)

## 2020-12-18 ENCOUNTER — Encounter (INDEPENDENT_AMBULATORY_CARE_PROVIDER_SITE_OTHER): Payer: Self-pay | Admitting: Nurse Practitioner

## 2020-12-18 ENCOUNTER — Ambulatory Visit (INDEPENDENT_AMBULATORY_CARE_PROVIDER_SITE_OTHER): Payer: Medicare Other | Admitting: Nurse Practitioner

## 2020-12-18 VITALS — BP 139/73 | HR 64 | Resp 16 | Wt 298.0 lb

## 2020-12-18 DIAGNOSIS — L97221 Non-pressure chronic ulcer of left calf limited to breakdown of skin: Secondary | ICD-10-CM

## 2020-12-18 NOTE — Progress Notes (Signed)
History of Present Illness  There is no documented history at this time  Assessments & Plan   There are no diagnoses linked to this encounter.    Additional instructions  Subjective:  Patient presents with venous ulcer of the Left lower extremity.    Procedure:  3 layer unna wrap was placed Left lower extremity.   Plan:   Follow up in one week.  

## 2020-12-20 ENCOUNTER — Encounter (INDEPENDENT_AMBULATORY_CARE_PROVIDER_SITE_OTHER): Payer: Self-pay | Admitting: Nurse Practitioner

## 2020-12-23 ENCOUNTER — Ambulatory Visit (INDEPENDENT_AMBULATORY_CARE_PROVIDER_SITE_OTHER): Payer: Medicare Other | Admitting: Nurse Practitioner

## 2020-12-23 ENCOUNTER — Other Ambulatory Visit: Payer: Self-pay

## 2020-12-23 ENCOUNTER — Encounter (INDEPENDENT_AMBULATORY_CARE_PROVIDER_SITE_OTHER): Payer: Self-pay | Admitting: Nurse Practitioner

## 2020-12-23 VITALS — BP 129/79 | HR 96 | Resp 16 | Wt 298.0 lb

## 2020-12-23 DIAGNOSIS — L97221 Non-pressure chronic ulcer of left calf limited to breakdown of skin: Secondary | ICD-10-CM

## 2020-12-23 DIAGNOSIS — E785 Hyperlipidemia, unspecified: Secondary | ICD-10-CM | POA: Diagnosis not present

## 2020-12-23 DIAGNOSIS — I10 Essential (primary) hypertension: Secondary | ICD-10-CM | POA: Diagnosis not present

## 2020-12-28 ENCOUNTER — Encounter (INDEPENDENT_AMBULATORY_CARE_PROVIDER_SITE_OTHER): Payer: Self-pay | Admitting: Nurse Practitioner

## 2020-12-28 NOTE — Progress Notes (Signed)
Subjective:    Patient ID: Jasmine Webb, female    DOB: January 29, 1956, 65 y.o.   MRN: [id number] Chief Complaint  Patient presents with   Follow-up    4wk unna boot check    Jasmine Webb is a 65 year old female that presents today for wrap check and follow-up.  Today the ulceration on her left lower extremity has healed.  The patient notes that her leg feels much better.  She denies any new wounds or ulcerations.  She denies any weeping of the lower extremities.  Overall she is very happy with the progress.   Review of Systems  Cardiovascular:  Positive for leg swelling.  Skin:  Negative for wound.  All other systems reviewed and are negative.     Objective:   Physical Exam Vitals reviewed.  HENT:     Head: Normocephalic.  Cardiovascular:     Rate and Rhythm: Normal rate.     Pulses: Normal pulses.  Pulmonary:     Effort: Pulmonary effort is normal.  Musculoskeletal:     Right lower leg: 1+ Edema present.     Left lower leg: 1+ Edema present.  Skin:    Comments: Dermal thickening bilaterally  Neurological:     Mental Status: She is alert and oriented to person, place, and time.  Psychiatric:        Mood and Affect: Mood normal.        Behavior: Behavior normal.        Thought Content: Thought content normal.        Judgment: Judgment normal.    BP 129/79 (BP Location: Left Arm)   Pulse 96   Resp 16   Wt 298 lb (135.2 kg)   LMP  (LMP Unknown)   BMI 52.79 kg/m   Past Medical History:  Diagnosis Date   Arthritis    Hyperlipidemia    Hypertension    Tumor    breast and pelvic   Vertigo     Social History   Socioeconomic History   Marital status: Divorced    Spouse name: Not on file   Number of children: Not on file   Years of education: Not on file   Highest education level: Not on file  Occupational History   Not on file  Tobacco Use   Smoking status: Never   Smokeless tobacco: Never  Vaping Use   Vaping Use: Never used  Substance and Sexual  Activity   Alcohol use: Never   Drug use: Never   Sexual activity: Not Currently  Other Topics Concern   Not on file  Social History Narrative   Not on file   Social Determinants of Health   Financial Resource Strain: Low Risk    Difficulty of Paying Living Expenses: Not hard at all  Food Insecurity: No Food Insecurity   Worried About Charity fundraiser in the Last Year: Never true   Ran Out of Food in the Last Year: Never true  Transportation Needs: No Transportation Needs   Lack of Transportation (Medical): No   Lack of Transportation (Non-Medical): No  Physical Activity: Inactive   Days of Exercise per Week: 0 days   Minutes of Exercise per Session: 0 min  Stress: No Stress Concern Present   Feeling of Stress : Not at all  Social Connections: Not on file  Intimate Partner Violence: Not on file    Past Surgical History:  Procedure Laterality Date   ABDOMINAL HYSTERECTOMY  2003   BREAST  SURGERY     PULMONARY THROMBECTOMY N/A 10/10/2019   Procedure: PULMONARY THROMBECTOMY;  Surgeon: Algernon Huxley, MD;  Location: [facility];  Service: Cardiovascular;  Laterality: N/A;    Family History  Problem Relation Age of Onset   COPD Mother    ALS Father    Heart disease Sister    Breast cancer Sister    Colon cancer Sister    Diabetes Brother    Hypertension Brother    Asthma Son    Sinusitis Son    Congestive Heart Failure Maternal Uncle     Allergies  Allergen Reactions   Ibuprofen     Told to avoid ibuprofen related products by doctor.    CBC Latest Ref Rng & Units 11/13/2020 06/23/2020 11/19/2019  WBC 4.0 - 10.5 K/uL 9.5 6.1 7.3  Hemoglobin 12.0 - 15.0 g/dL 12.4 13.8 12.5  Hematocrit 36.0 - 46.0 % 36.6 42.1 37.7  Platelets 150 - 400 K/uL 217 189 223      CMP     Component Value Date/Time   NA 141 11/13/2020 2052   NA 141 06/23/2020 1115   K 3.0 (L) 11/13/2020 2052   CL 102 11/13/2020 2052   CO2 30 11/13/2020 2052   GLUCOSE 122 (H) 11/13/2020  2052   BUN 14 11/13/2020 2052   BUN 15 06/23/2020 1115   CREATININE 0.71 11/13/2020 2052   CALCIUM 9.0 11/13/2020 2052   PROT 6.5 06/23/2020 1115   ALBUMIN 4.5 06/23/2020 1115   AST 16 06/23/2020 1115   ALT 15 06/23/2020 1115   ALKPHOS 83 06/23/2020 1115   BILITOT 1.3 (H) 06/23/2020 1115   GFRNONAA >60 11/13/2020 2052   GFRAA 111 01/07/2020 0832     No results found.     Assessment & Plan:   1. Lower limb ulcer, calf, left, limited to breakdown of skin ([place]) The patient's wound has healed.    Recommend:  No surgery or intervention at this point in time.    I have reviewed my previous discussion with the patient regarding swelling and why it causes symptoms.  Patient will continue wearing graduated compression stockings class 1 (20-30 mmHg) on a daily basis. The patient will  beginning wearing the stockings first thing in the morning and removing them in the evening. The patient is instructed specifically not to sleep in the stockings.    In addition, behavioral modification including several periods of elevation of the lower extremities during the day will be continued.  This was reviewed with the patient during the initial visit.  The patient will also continue routine exercise, especially walking on a daily basis as was discussed during the initial visit.    Despite conservative treatments of at least 4 weeks including graduated compression therapy class 1 and behavioral modification including exercise and elevation the patient  has not obtained adequate control of the lymphedema.  The patient still has stage 3 lymphedema and therefore, I believe that a lymph pump should be added to improve the control of the patient's lymphedema.  Additionally, a lymph pump is warranted because it will reduce the risk of cellulitis and ulceration in the future.  Patient should follow-up in six months   2. Essential hypertension Continue antihypertensive medications as already ordered, these  medications have been reviewed and there are no changes at this time.   3. Hyperlipidemia, unspecified hyperlipidemia type Continue statin as ordered and reviewed, no changes at this time    Current Outpatient Medications on File  Prior to Visit  Medication Sig Dispense Refill   acetaminophen (TYLENOL) 500 MG tablet Take 500 mg by mouth every 6 (six) hours as needed.     atorvastatin (LIPITOR) 40 MG tablet Take 1 tablet (40 mg total) by mouth daily. 90 tablet 4   ELIQUIS 5 MG TABS tablet TAKE 1 TABLET BY MOUTH TWICE A DAY 180 tablet 0   FIBER PO Take by mouth daily.     fluticasone (FLONASE) 50 MCG/ACT nasal spray Place 2 sprays into both nostrils daily.     furosemide (LASIX) 20 MG tablet TAKE 1 TABLET BY MOUTH TWICE A DAY AS NEEDED FOR EDEMA 180 tablet 2   levocetirizine (XYZAL) 5 MG tablet TAKE 1 TABLET BY MOUTH EVERY DAY IN THE EVENING 90 tablet 1   losartan (COZAAR) 100 MG tablet Take 1 tablet (100 mg total) by mouth daily. 90 tablet 4   metoprolol tartrate (LOPRESSOR) 25 MG tablet TAKE 1 TABLET BY MOUTH TWICE A DAY 60 tablet 5   Multiple Vitamins-Minerals (MULTIVITAMIN GUMMIES ADULT PO) Take by mouth daily.     No current facility-administered medications on file prior to visit.    There are no Patient Instructions on file for this visit. No follow-ups on file.   Kris Hartmann, NP

## 2021-01-04 ENCOUNTER — Ambulatory Visit: Payer: Medicare Other

## 2021-01-04 ENCOUNTER — Telehealth: Payer: Self-pay | Admitting: Nurse Practitioner

## 2021-01-04 NOTE — Telephone Encounter (Signed)
Copied from [place] [phone]. Topic: Appointment Scheduling - Scheduling Inquiry for Clinic >> Jan 04, 2021 12:48 PM McGill, Nelva Bush wrote: Reason for CRM: pt would like to reschedule Medicare Wellness visit on 01/13/21.

## 2021-01-13 ENCOUNTER — Ambulatory Visit: Payer: Medicare Other

## 2021-01-19 ENCOUNTER — Ambulatory Visit (INDEPENDENT_AMBULATORY_CARE_PROVIDER_SITE_OTHER): Payer: Medicare Other | Admitting: *Deleted

## 2021-01-19 DIAGNOSIS — Z0000 Encounter for general adult medical examination without abnormal findings: Secondary | ICD-10-CM

## 2021-01-19 NOTE — Patient Instructions (Signed)
Jasmine Webb , Thank you for taking time to come for your Medicare Wellness Visit. I appreciate your ongoing commitment to your health goals. Please review the following plan we discussed and let me know if I can assist you in the future.   Screening recommendations/referrals: Colonoscopy: Education provided Mammogram: Education provided Bone Density: Education  provided Recommended yearly ophthalmology/optometry visit for glaucoma screening and checkup Recommended yearly dental visit for hygiene and checkup  Vaccinations: Influenza vaccine: Education provided Pneumococcal vaccine: Education provided Tdap vaccine: up to date Shingles vaccine: Education provided    Advanced directives: Education provided  Conditions/risks identified:   Next appointment: 02-08-2021  @ 3:40  [employer]    Preventive Care 20 Years and Older, Female Preventive care refers to lifestyle choices and visits with your health care provider that can promote health and wellness. What does preventive care include? A yearly physical exam. This is also called an annual well check. Dental exams once or twice a year. Routine eye exams. Ask your health care provider how often you should have your eyes checked. Personal lifestyle choices, including: Daily care of your teeth and gums. Regular physical activity. Eating a healthy diet. Avoiding tobacco and drug use. Limiting alcohol use. Practicing safe sex. Taking low-dose aspirin every day. Taking vitamin and mineral supplements as recommended by your health care provider. What happens during an annual well check? The services and screenings done by your health care provider during your annual well check will depend on your age, overall health, lifestyle risk factors, and family history of disease. Counseling  Your health care provider may ask you questions about your: Alcohol use. Tobacco use. Drug use. Emotional well-being. Home and relationship  well-being. Sexual activity. Eating habits. History of falls. Memory and ability to understand (cognition). Work and work Statistician. Reproductive health. Screening  You may have the following tests or measurements: Height, weight, and BMI. Blood pressure. Lipid and cholesterol levels. These may be checked every 5 years, or more frequently if you are over 38 years old. Skin check. Lung cancer screening. You may have this screening every year starting at age 3 if you have a 30-pack-year history of smoking and currently smoke or have quit within the past 15 years. Fecal occult blood test (FOBT) of the stool. You may have this test every year starting at age 28. Flexible sigmoidoscopy or colonoscopy. You may have a sigmoidoscopy every 5 years or a colonoscopy every 10 years starting at age 40. Hepatitis C blood test. Hepatitis B blood test. Sexually transmitted disease (STD) testing. Diabetes screening. This is done by checking your blood sugar (glucose) after you have not eaten for a while (fasting). You may have this done every 1-3 years. Bone density scan. This is done to screen for osteoporosis. You may have this done starting at age 56. Mammogram. This may be done every 1-2 years. Talk to your health care provider about how often you should have regular mammograms. Talk with your health care provider about your test results, treatment options, and if necessary, the need for more tests. Vaccines  Your health care provider may recommend certain vaccines, such as: Influenza vaccine. This is recommended every year. Tetanus, diphtheria, and acellular pertussis (Tdap, Td) vaccine. You may need a Td booster every 10 years. Zoster vaccine. You may need this after age 31. Pneumococcal 13-valent conjugate (PCV13) vaccine. One dose is recommended after age 74. Pneumococcal polysaccharide (PPSV23) vaccine. One dose is recommended after age 54. Talk to your health care provider about  which  screenings and vaccines you need and how often you need them. This information is not intended to replace advice given to you by your health care provider. Make sure you discuss any questions you have with your health care provider. Document Released: 02/13/2015 Document Revised: 10/07/2015 Document Reviewed: 11/18/2014 Elsevier Interactive Patient Education  2017 [employer] Prevention in the Home Falls can cause injuries. They can happen to people of all ages. There are many things you can do to make your home safe and to help prevent falls. What can I do on the outside of my home? Regularly fix the edges of walkways and driveways and fix any cracks. Remove anything that might make you trip as you walk through a door, such as a raised step or threshold. Trim any bushes or trees on the path to your home. Use bright outdoor lighting. Clear any walking paths of anything that might make someone trip, such as rocks or tools. Regularly check to see if handrails are loose or broken. Make sure that both sides of any steps have handrails. Any raised decks and porches should have guardrails on the edges. Have any leaves, snow, or ice cleared regularly. Use sand or salt on walking paths during winter. Clean up any spills in your garage right away. This includes oil or grease spills. What can I do in the bathroom? Use night lights. Install grab bars by the toilet and in the tub and shower. Do not use towel bars as grab bars. Use non-skid mats or decals in the tub or shower. If you need to sit down in the shower, use a plastic, non-slip stool. Keep the floor dry. Clean up any water that spills on the floor as soon as it happens. Remove soap buildup in the tub or shower regularly. Attach bath mats securely with double-sided non-slip rug tape. Do not have throw rugs and other things on the floor that can make you trip. What can I do in the bedroom? Use night lights. Make sure that you have a  light by your bed that is easy to reach. Do not use any sheets or blankets that are too big for your bed. They should not hang down onto the floor. Have a firm chair that has side arms. You can use this for support while you get dressed. Do not have throw rugs and other things on the floor that can make you trip. What can I do in the kitchen? Clean up any spills right away. Avoid walking on wet floors. Keep items that you use a lot in easy-to-reach places. If you need to reach something above you, use a strong step stool that has a grab bar. Keep electrical cords out of the way. Do not use floor polish or wax that makes floors slippery. If you must use wax, use non-skid floor wax. Do not have throw rugs and other things on the floor that can make you trip. What can I do with my stairs? Do not leave any items on the stairs. Make sure that there are handrails on both sides of the stairs and use them. Fix handrails that are broken or loose. Make sure that handrails are as long as the stairways. Check any carpeting to make sure that it is firmly attached to the stairs. Fix any carpet that is loose or worn. Avoid having throw rugs at the top or bottom of the stairs. If you do have throw rugs, attach them to the floor with  carpet tape. Make sure that you have a light switch at the top of the stairs and the bottom of the stairs. If you do not have them, ask someone to add them for you. What else can I do to help prevent falls? Wear shoes that: Do not have high heels. Have rubber bottoms. Are comfortable and fit you well. Are closed at the toe. Do not wear sandals. If you use a stepladder: Make sure that it is fully opened. Do not climb a closed stepladder. Make sure that both sides of the stepladder are locked into place. Ask someone to hold it for you, if possible. Clearly mark and make sure that you can see: Any grab bars or handrails. First and last steps. Where the edge of each step  is. Use tools that help you move around (mobility aids) if they are needed. These include: Canes. Walkers. Scooters. Crutches. Turn on the lights when you go into a dark area. Replace any light bulbs as soon as they burn out. Set up your furniture so you have a clear path. Avoid moving your furniture around. If any of your floors are uneven, fix them. If there are any pets around you, be aware of where they are. Review your medicines with your doctor. Some medicines can make you feel dizzy. This can increase your chance of falling. Ask your doctor what other things that you can do to help prevent falls. This information is not intended to replace advice given to you by your health care provider. Make sure you discuss any questions you have with your health care provider. Document Released: 11/13/2008 Document Revised: 06/25/2015 Document Reviewed: 02/21/2014 Elsevier Interactive Patient Education  2017 [employer].

## 2021-01-19 NOTE — Progress Notes (Signed)
Subjective:   Jasmine Webb is a 65 y.o. female who presents for Medicare Annual (Subsequent) preventive examination.  I connected with  Celine Dishman on 01/19/21 by a telephone enabled telemedicine application and verified that I am speaking with the correct person using two identifiers.   I discussed the limitations of evaluation and management by telemedicine. The patient expressed understanding and agreed to proceed.  Patient location: home  Provider location: Tele-Health  not in office    Review of Systems     Cardiac Risk Factors include: advanced age (>54men, >52 women);hypertension;obesity (BMI >30kg/m2)     Objective:    Today's Vitals   There is no height or weight on file to calculate BMI.  Advanced Directives 01/19/2021 11/15/2020 11/13/2020 01/13/2020  Does Patient Have a Medical Advance Directive? No No No Yes  Type of Advance Directive - - Public librarian;Living will  Copy of [place] in Chart? - - - No - copy requested  Would patient like information on creating a medical advance directive? No - Patient declined No - Patient declined - -    Current Medications (verified) Outpatient Encounter Medications as of 01/19/2021  Medication Sig   acetaminophen (TYLENOL) 500 MG tablet Take 500 mg by mouth every 6 (six) hours as needed.   atorvastatin (LIPITOR) 40 MG tablet Take 1 tablet (40 mg total) by mouth daily.   ELIQUIS 5 MG TABS tablet TAKE 1 TABLET BY MOUTH TWICE A DAY   FIBER PO Take by mouth daily.   fluticasone (FLONASE) 50 MCG/ACT nasal spray Place 2 sprays into both nostrils daily.   furosemide (LASIX) 20 MG tablet TAKE 1 TABLET BY MOUTH TWICE A DAY AS NEEDED FOR EDEMA   levocetirizine (XYZAL) 5 MG tablet TAKE 1 TABLET BY MOUTH EVERY DAY IN THE EVENING   losartan (COZAAR) 100 MG tablet Take 1 tablet (100 mg total) by mouth daily.   metoprolol tartrate (LOPRESSOR) 25 MG tablet TAKE 1 TABLET BY MOUTH TWICE A DAY   Multiple  Vitamins-Minerals (MULTIVITAMIN GUMMIES ADULT PO) Take by mouth daily.   No facility-administered encounter medications on file as of 01/19/2021.    Allergies (verified) Ibuprofen   History: Past Medical History:  Diagnosis Date   Arthritis    Hyperlipidemia    Hypertension    Tumor    breast and pelvic   Vertigo    Past Surgical History:  Procedure Laterality Date   ABDOMINAL HYSTERECTOMY  2003   BREAST SURGERY     PULMONARY THROMBECTOMY N/A 10/10/2019   Procedure: PULMONARY THROMBECTOMY;  Surgeon: Algernon Huxley, MD;  Location: [facility];  Service: Cardiovascular;  Laterality: N/A;   Family History  Problem Relation Age of Onset   COPD Mother    ALS Father    Heart disease Sister    Breast cancer Sister    Colon cancer Sister    Diabetes Brother    Hypertension Brother    Asthma Son    Sinusitis Son    Congestive Heart Failure Maternal Uncle    Social History   Socioeconomic History   Marital status: Divorced    Spouse name: Not on file   Number of children: Not on file   Years of education: Not on file   Highest education level: Not on file  Occupational History   Not on file  Tobacco Use   Smoking status: Never   Smokeless tobacco: Never  Vaping Use   Vaping Use:  Never used  Substance and Sexual Activity   Alcohol use: Never   Drug use: Never   Sexual activity: Not Currently  Other Topics Concern   Not on file  Social History Narrative   Not on file   Social Determinants of Health   Financial Resource Strain: Low Risk    Difficulty of Paying Living Expenses: Not hard at all  Food Insecurity: No Food Insecurity   Worried About Charity fundraiser in the Last Year: Never true   [place] in the Last Year: Never true  Transportation Needs: No Transportation Needs   Lack of Transportation (Medical): No   Lack of Transportation (Non-Medical): No  Physical Activity: Insufficiently Active   Days of Exercise per Week: 3 days   Minutes  of Exercise per Session: 40 min  Stress: No Stress Concern Present   Feeling of Stress : Not at all  Social Connections: Moderately Isolated   Frequency of Communication with Friends and Family: More than three times a week   Frequency of Social Gatherings with Friends and Family: More than three times a week   Attends Religious Services: More than 4 times per year   Active Member of [employer] or Organizations: No   Attends Music therapist: Never   Marital Status: Divorced    Tobacco Counseling Counseling given: Not Answered   Clinical Intake:  Pre-visit preparation completed: Yes  Pain : No/denies pain     Nutritional Risks: None Diabetes: No  How often do you need to have someone help you when you read instructions, pamphlets, or other written materials from your doctor or pharmacy?: 1 - Never  Diabetic?  no  Interpreter Needed?: No  Information entered by :: Leroy Kennedy LPN   Activities of Daily Living In your present state of health, do you have any difficulty performing the following activities: 01/19/2021  Hearing? N  Vision? N  Difficulty concentrating or making decisions? N  Walking or climbing stairs? Y  Dressing or bathing? N  Doing errands, shopping? N  Preparing Food and eating ? N  Using the Toilet? N  In the past six months, have you accidently leaked urine? N  Do you have problems with loss of bowel control? N  Managing your Medications? N  Managing your Finances? N  Housekeeping or managing your Housekeeping? N  Some recent data might be hidden    Patient Care Team: Venita Lick, NP as PCP - General (Nurse Practitioner)  Indicate any recent Medical Services you may have received from other than [facility] providers in the past year (date may be approximate).     Assessment:   This is a routine wellness examination for Jasmine Webb.  Hearing/Vision screen Hearing Screening - Comments:: No trouble hearing Vision Screening - Comments::  Not up to date   Dietary issues and exercise activities discussed: Current Exercise Habits: Home exercise routine, Type of exercise: walking, Time (Minutes): 35, Frequency (Times/Week): 4, Weekly Exercise (Minutes/Week): 140, Intensity: Mild   Goals Addressed             This Visit's Progress    Patient Stated   On track    01/13/2020, wants to lose weight     Weight (lb) < 200 lb (90.7 kg)         Depression Screen PHQ 2/9 Scores 01/19/2021 06/23/2020 01/13/2020 05/22/2019 04/16/2019  PHQ - 2 Score 0 0 0 0 0  PHQ- 9 Score - 0 - - -  Fall Risk Fall Risk  01/19/2021 01/13/2020 05/22/2019 04/16/2019  Falls in the past year? 0 0 0 0  Number falls in past yr: 0 - 0 0  Injury with Fall? 0 - 0 0  Risk for fall due to : - Medication side effect - -  Follow up Falls evaluation completed;Falls prevention discussed Falls evaluation completed;Education provided Falls evaluation completed Falls evaluation completed    FALL RISK PREVENTION PERTAINING TO THE HOME:  Any stairs in or around the home? No  If so, are there any without handrails? No  Home free of loose throw rugs in walkways, pet beds, electrical cords, etc? Yes  Adequate lighting in your home to reduce risk of falls? Yes   ASSISTIVE DEVICES UTILIZED TO PREVENT FALLS:  Life alert? No  Use of a cane, walker or w/c? Yes  Grab bars in the bathroom? Yes  Shower chair or bench in shower? Yes  Elevated toilet seat or a handicapped toilet? Yes   TIMED UP AND GO:  Was the test performed? No .   Cognitive Function:  Normal cognitive status assessed by direct observation by this Nurse Health Advisor. No abnormalities found.       6CIT Screen 01/13/2020  What Year? 0 points  What month? 0 points  What time? 0 points  Count back from 20 0 points  Months in reverse 2 points  Repeat phrase 2 points  Total Score 4    Immunizations Immunization History  Administered Date(s) Administered   Tdap 06/23/2020    TDAP  status: Up to date  Flu Vaccine status: Declined, Education has been provided regarding the importance of this vaccine but patient still declined. Advised may receive this vaccine at local pharmacy or Health Dept. Aware to provide a copy of the vaccination record if obtained from local pharmacy or Health Dept. Verbalized acceptance and understanding.  Pneumococcal vaccine status: Declined,  Education has been provided regarding the importance of this vaccine but patient still declined. Advised may receive this vaccine at local pharmacy or Health Dept. Aware to provide a copy of the vaccination record if obtained from local pharmacy or Health Dept. Verbalized acceptance and understanding.   Covid-19 vaccine status: Declined, Education has been provided regarding the importance of this vaccine but patient still declined. Advised may receive this vaccine at local pharmacy or Health Dept.or vaccine clinic. Aware to provide a copy of the vaccination record if obtained from local pharmacy or Health Dept. Verbalized acceptance and understanding.  Qualifies for Shingles Vaccine? Yes   Zostavax completed No   Shingrix Completed?: No.    Education has been provided regarding the importance of this vaccine. Patient has been advised to call insurance company to determine out of pocket expense if they have not yet received this vaccine. Advised may also receive vaccine at local pharmacy or Health Dept. Verbalized acceptance and understanding.  Screening Tests Health Maintenance  Topic Date Due   Zoster Vaccines- Shingrix (1 of 2) 02/25/2021 (Originally 12/26/2003)   MAMMOGRAM  06/23/2021 (Originally 12/26/2003)   DEXA SCAN  06/23/2021 (Originally 12/26/2018)   Pneumonia Vaccine 68+ Years old (1 - PCV) 11/25/2021 (Originally 12/26/2018)   COLON CANCER SCREENING ANNUAL FOBT  06/30/2021   TETANUS/TDAP  06/24/2030   Hepatitis C Screening  Completed   HPV VACCINES  Aged Out   INFLUENZA VACCINE  Discontinued    COVID-19 Vaccine  Discontinued   Fecal DNA (Cologuard)  Discontinued    Health Maintenance  There are no preventive care  reminders to display for this patient.  Colonoscopy  declined  Mammogram  declined  Bone Density  declined  Lung Cancer Screening: (Low Dose CT Chest recommended if Age 21-80 years, 30 pack-year currently smoking OR have quit w/in 15years.) does not qualify.   Lung Cancer Screening Referral:   Additional Screening:  Hepatitis C Screening: does not qualify; Completed 2022  Vision Screening: Recommended annual ophthalmology exams for early detection of glaucoma and other disorders of the eye. Is the patient up to date with their annual eye exam?  No  Who is the provider or what is the name of the office in which the patient attends annual eye exams?  If pt is not established with a provider, would they like to be referred to a provider to establish care? No .   Dental Screening: Recommended annual dental exams for proper oral hygiene  Community Resource Referral / Chronic Care Management: CRR required this visit?  No   CCM required this visit?  No      Plan:     I have personally reviewed and noted the following in the patients chart:   Medical and social history Use of alcohol, tobacco or illicit drugs  Current medications and supplements including opioid prescriptions.  Functional ability and status Nutritional status Physical activity Advanced directives List of other physicians Hospitalizations, surgeries, and ER visits in previous 12 months Vitals Screenings to include cognitive, depression, and falls Referrals and appointments  In addition, I have reviewed and discussed with patient certain preventive protocols, quality metrics, and best practice recommendations. A written personalized care plan for preventive services as well as general preventive health recommendations were provided to patient.     Leroy Kennedy, LPN   [id number]    Nurse Notes:

## 2021-01-21 DIAGNOSIS — I890 Lymphedema, not elsewhere classified: Secondary | ICD-10-CM | POA: Diagnosis not present

## 2021-02-01 ENCOUNTER — Other Ambulatory Visit: Payer: Self-pay | Admitting: Nurse Practitioner

## 2021-02-02 NOTE — Telephone Encounter (Signed)
Requested Prescriptions  Pending Prescriptions Disp Refills   losartan (COZAAR) 100 MG tablet [Pharmacy Med Name: LOSARTAN POTASSIUM 100 MG TAB] 90 tablet 4    Sig: TAKE 1 TABLET BY MOUTH EVERY DAY     Cardiovascular:  Angiotensin Receptor Blockers Failed - 02/01/2021  1:33 AM      Failed - K in normal range and within 180 days    Potassium  Date Value Ref Range Status  11/13/2020 3.0 (L) 3.5 - 5.1 mmol/L Final         Passed - Cr in normal range and within 180 days    Creatinine, Ser  Date Value Ref Range Status  11/13/2020 0.71 0.44 - 1.00 mg/dL Final         Passed - Patient is not pregnant      Passed - Last BP in normal range    BP Readings from Last 1 Encounters:  12/23/20 129/79         Passed - Valid encounter within last 6 months    Recent Outpatient Visits          2 months ago Chronic skin ulcer, limited to breakdown of skin ([place])   [facility] [place], [place] T, NP   5 months ago Cellulitis of left lower extremity   [facility] Jon Billings, NP   6 months ago SVT (supraventricular tachycardia) ([place])   [place], [place] T, NP   7 months ago Morbid obesity due to excess calories ([place])   [facility] [place], [place] T, NP   9 months ago Seasonal allergic rhinitis due to pollen   [facility], Barb Merino, DO      Future Appointments            In 6 days Cannady, Barbaraann Faster, NP [employer]

## 2021-02-04 ENCOUNTER — Ambulatory Visit: Payer: Medicare Other | Admitting: Nurse Practitioner

## 2021-02-06 ENCOUNTER — Other Ambulatory Visit: Payer: Self-pay | Admitting: Nurse Practitioner

## 2021-02-06 NOTE — Telephone Encounter (Signed)
Requested Prescriptions  Pending Prescriptions Disp Refills   atorvastatin (LIPITOR) 40 MG tablet [Pharmacy Med Name: ATORVASTATIN 40 MG TABLET] 90 tablet 1    Sig: TAKE 1 TABLET BY MOUTH EVERY DAY     Cardiovascular:  Antilipid - Statins Passed - 02/06/2021 12:46 AM      Passed - Total Cholesterol in normal range and within 360 days    Cholesterol, Total  Date Value Ref Range Status  06/23/2020 138 100 - 199 mg/dL Final         Passed - LDL in normal range and within 360 days    LDL Chol Calc (NIH)  Date Value Ref Range Status  06/23/2020 67 0 - 99 mg/dL Final         Passed - HDL in normal range and within 360 days    HDL  Date Value Ref Range Status  06/23/2020 50 >39 mg/dL Final         Passed - Triglycerides in normal range and within 360 days    Triglycerides  Date Value Ref Range Status  06/23/2020 114 0 - 149 mg/dL Final         Passed - Patient is not pregnant      Passed - Valid encounter within last 12 months    Recent Outpatient Visits          2 months ago Chronic skin ulcer, limited to breakdown of skin ([place])   [place], [place] T, NP   5 months ago Cellulitis of left lower extremity   [facility] Jon Billings, NP   6 months ago SVT (supraventricular tachycardia) ([place])   [place], [place] T, NP   7 months ago Morbid obesity due to excess calories ([place])   [place], [place] T, NP   9 months ago Seasonal allergic rhinitis due to pollen   [facility], Barb Merino, DO      Future Appointments            In 2 days Cannady, Barbaraann Faster, NP [employer]

## 2021-02-08 ENCOUNTER — Encounter: Payer: Self-pay | Admitting: Nurse Practitioner

## 2021-02-08 ENCOUNTER — Other Ambulatory Visit: Payer: Self-pay

## 2021-02-08 ENCOUNTER — Other Ambulatory Visit: Payer: Self-pay | Admitting: Cardiovascular Disease

## 2021-02-08 ENCOUNTER — Ambulatory Visit (INDEPENDENT_AMBULATORY_CARE_PROVIDER_SITE_OTHER): Payer: Medicare Other | Admitting: Nurse Practitioner

## 2021-02-08 VITALS — BP 115/74 | HR 64 | Temp 97.7°F | Ht 63.0 in | Wt 295.0 lb

## 2021-02-08 DIAGNOSIS — I890 Lymphedema, not elsewhere classified: Secondary | ICD-10-CM

## 2021-02-08 DIAGNOSIS — I071 Rheumatic tricuspid insufficiency: Secondary | ICD-10-CM | POA: Insufficient documentation

## 2021-02-08 DIAGNOSIS — Z23 Encounter for immunization: Secondary | ICD-10-CM

## 2021-02-08 DIAGNOSIS — E559 Vitamin D deficiency, unspecified: Secondary | ICD-10-CM | POA: Diagnosis not present

## 2021-02-08 DIAGNOSIS — R7301 Impaired fasting glucose: Secondary | ICD-10-CM | POA: Diagnosis not present

## 2021-02-08 DIAGNOSIS — I10 Essential (primary) hypertension: Secondary | ICD-10-CM

## 2021-02-08 DIAGNOSIS — L97221 Non-pressure chronic ulcer of left calf limited to breakdown of skin: Secondary | ICD-10-CM

## 2021-02-08 DIAGNOSIS — E782 Mixed hyperlipidemia: Secondary | ICD-10-CM | POA: Diagnosis not present

## 2021-02-08 DIAGNOSIS — Z86711 Personal history of pulmonary embolism: Secondary | ICD-10-CM | POA: Diagnosis not present

## 2021-02-08 DIAGNOSIS — I340 Nonrheumatic mitral (valve) insufficiency: Secondary | ICD-10-CM | POA: Diagnosis not present

## 2021-02-08 DIAGNOSIS — Z780 Asymptomatic menopausal state: Secondary | ICD-10-CM

## 2021-02-08 DIAGNOSIS — I471 Supraventricular tachycardia: Secondary | ICD-10-CM

## 2021-02-08 DIAGNOSIS — Z6841 Body Mass Index (BMI) 40.0 and over, adult: Secondary | ICD-10-CM | POA: Diagnosis not present

## 2021-02-08 DIAGNOSIS — R7309 Other abnormal glucose: Secondary | ICD-10-CM | POA: Insufficient documentation

## 2021-02-08 LAB — MICROALBUMIN, URINE WAIVED
Creatinine, Urine Waived: 50 mg/dL (ref 10–300)
Microalb, Ur Waived: 10 mg/L (ref 0–19)
Microalb/Creat Ratio: <30 mg/g (ref <30)

## 2021-02-08 LAB — BAYER DCA HB A1C WAIVED: HB A1C (BAYER DCA - WAIVED): 6 % — ABNORMAL HIGH (ref 4.8–5.6)

## 2021-02-08 MED ORDER — ATORVASTATIN CALCIUM 40 MG PO TABS: 40.0000 mg | ORAL_TABLET | Freq: Every day | ORAL | 4 refills | Status: DC

## 2021-02-08 MED ORDER — APIXABAN 5 MG PO TABS: 5.0000 mg | ORAL_TABLET | Freq: Two times a day (BID) | ORAL | 4 refills | Status: DC

## 2021-02-08 MED ORDER — LEVOCETIRIZINE DIHYDROCHLORIDE 5 MG PO TABS: ORAL_TABLET | ORAL | 4 refills | Status: DC

## 2021-02-08 MED ORDER — LOSARTAN POTASSIUM 100 MG PO TABS
100.0000 mg | ORAL_TABLET | Freq: Every day | ORAL | 4 refills | Status: DC
Start: 2021-02-08 — End: 2022-03-23

## 2021-02-08 NOTE — Assessment & Plan Note (Signed)
Chronic, stable.   Continue to collaborate with vascular as needed and use compression pumps at home. 

## 2021-02-08 NOTE — Assessment & Plan Note (Signed)
Followed by cardiology, continue this collaboration and current medication regimen. 

## 2021-02-08 NOTE — Assessment & Plan Note (Signed)
With Covid in September 2021.  At this time will continue Eliquis and collaboration with vascular + cardiology.  Refills sent in.  Cardiology recommends lifelong treatment.

## 2021-02-08 NOTE — Assessment & Plan Note (Signed)
Overall healed at this time.  Continue to collaborate with vascular as needed and use compression pumps at home.

## 2021-02-08 NOTE — Assessment & Plan Note (Signed)
Chronic, ongoing.  Continue current medication regimen and adjust as needed, Atorvastatin 40 MG.  Lipid panel today. 

## 2021-02-08 NOTE — Assessment & Plan Note (Signed)
Ongoing and followed by cardiology, will continue Metoprolol as ordered by them.  Recent notes and testing reviewed.  She has no symptoms at this time. 

## 2021-02-08 NOTE — Assessment & Plan Note (Signed)
Ongoing with BP at goal today in office.  Recommend she monitor BP at home regularly and document.  Will continue Losartan 100 MG daily, Metoprolol, and Lasix 20 MG to take as needed for edema.  Would avoid Amlodipine due to baseline edema to BLE with lymphedema. Focus on DASH diet at home.  Return in 6 months.  Recommend: - Reminded to call for an overnight weight gain of >2 pounds or a weekly weight weight of >5 pounds - not adding salt to his food and has been reading food labels. Reviewed the importance of keeping daily sodium intake to 2000mg  daily

## 2021-02-08 NOTE — Assessment & Plan Note (Signed)
Recommended eating smaller high protein, low fat meals more frequently and exercising 30 mins a day 5 times a week with a goal of 10-15lb weight loss in the next 3 months. Patient voiced their understanding and motivation to adhere to these recommendations.  

## 2021-02-08 NOTE — Assessment & Plan Note (Signed)
Mild to Moderate on echo July 2022.  Followed by cardiology, continue this collaboration and current medication regimen. 

## 2021-02-08 NOTE — Progress Notes (Signed)
BP 115/74    Pulse 64    Temp 97.7 F (36.5 C) (Oral)    Ht 5\' 3"  (1.6 m)    Wt 295 lb (133.8 kg)    LMP  (LMP Unknown)    SpO2 96%    BMI 52.26 kg/m    Subjective:    Patient ID: Jasmine Webb, female    DOB: March 31, 1955, 66 y.o.   MRN: [id number]  HPI: Jasmine Webb is a 66 y.o. female  Chief Complaint  Patient presents with   Hyperlipidemia   Hypertension   Herpes Zoster    Patient would like to discuss having her Shingles vaccine with provider at today's visit.    Handicap Placard    Patient would like to discuss having a Handicap Placard with provider.    She will check with insurance on Shingrix cost and location to administer.  Provided placard forms.  HYPERTENSION/HLD/PSVT Followed by cardiology with last visit 12/17/20.  Currently taking Apixaban for PE history with Covid 2021 == cardiology recommends lifelong anticoagulation due to risks.  Taking Lasix 20 MG daily, Losartan 100 MG daily, Atorvastatin 40 MG daily, Metoprolol 25 MG BID.    Echo in July 2022 noted EF 60-65% and grade 1 diastolic dysfunction + mild mitral regurgitation and mild to moderate tricuspid regurgitation.  She was followed by vascular for recent wound with her lymphedema, left lower limb, this is now healed and she was provided with compression pumps. Hypertension status: controlled  Satisfied with current treatment? yes Duration of hypertension: chronic BP monitoring frequency: none BP range:  BP medication side effects:  no Medication compliance: good compliance Previous BP meds:as above Aspirin: no Recurrent headaches: no Visual changes: no Palpitations: none Dyspnea: occasional, very seldom Chest pain: no Lower extremity edema:  at baseline Dizzy/lightheaded: none The 10-year ASCVD risk score (Arnett DK, et al., 2019) is: 6.6%   Values used to calculate the score:     Age: 1 years     Sex: Female     Is Non-Hispanic African American: No     Diabetic: No     Tobacco smoker: No      Systolic Blood Pressure: 109 mmHg     Is BP treated: Yes     HDL Cholesterol: 50 mg/dL     Total Cholesterol: 138 mg/dL  Depression screen [facility] 2/9 02/08/2021 01/19/2021 06/23/2020 01/13/2020 05/22/2019  Decreased Interest 0 0 0 0 0  Down, Depressed, Hopeless 0 0 0 0 0  PHQ - 2 Score 0 0 0 0 0  Altered sleeping 0 - 0 - -  Tired, decreased energy 0 - 0 - -  Change in appetite 0 - 0 - -  Feeling bad or failure about yourself  0 - 0 - -  Trouble concentrating 0 - 0 - -  Moving slowly or fidgety/restless 0 - 0 - -  Suicidal thoughts 0 - 0 - -  PHQ-9 Score 0 - 0 - -  Difficult doing work/chores Not difficult at all - Not difficult at all - -   Relevant past medical, surgical, family and social history reviewed and updated as indicated. Interim medical history since our last visit reviewed. Allergies and medications reviewed and updated.  Review of Systems  Constitutional:  Negative for activity change, appetite change, diaphoresis, fatigue and fever.  Respiratory:  Negative for cough, chest tightness and shortness of breath.   Cardiovascular:  Negative for chest pain, palpitations and leg swelling.  Gastrointestinal: Negative.   Endocrine:  Negative for cold intolerance, heat intolerance, polydipsia, polyphagia and polyuria.  Neurological:  Negative for dizziness, syncope, weakness, light-headedness, numbness and headaches.  Psychiatric/Behavioral: Negative.     Per HPI unless specifically indicated above     Objective:    BP 115/74    Pulse 64    Temp 97.7 F (36.5 C) (Oral)    Ht 5\' 3"  (1.6 m)    Wt 295 lb (133.8 kg)    LMP  (LMP Unknown)    SpO2 96%    BMI 52.26 kg/m   Wt Readings from Last 3 Encounters:  02/08/21 295 lb (133.8 kg)  12/23/20 298 lb (135.2 kg)  12/18/20 298 lb (135.2 kg)    Physical Exam Vitals and nursing note reviewed.  Constitutional:      General: She is awake. She is not in acute distress.    Appearance: She is well-developed and well-groomed. She is  morbidly obese. She is not ill-appearing.  HENT:     Head: Normocephalic.     Right Ear: Hearing normal.     Left Ear: Hearing normal.  Eyes:     General: Lids are normal.        Right eye: No discharge.        Left eye: No discharge.     Conjunctiva/sclera: Conjunctivae normal.     Pupils: Pupils are equal, round, and reactive to light.  Neck:     Vascular: No carotid bruit.  Cardiovascular:     Rate and Rhythm: Normal rate and regular rhythm.     Heart sounds: Murmur heard.  Systolic murmur is present with a grade of 2/6.    No gallop.     Comments: Baseline edema BLE with compression hose on. Pulmonary:     Effort: Pulmonary effort is normal. No accessory muscle usage or respiratory distress.     Breath sounds: Normal breath sounds.  Abdominal:     General: Bowel sounds are normal.     Palpations: Abdomen is soft.  Musculoskeletal:     Cervical back: Normal range of motion and neck supple.     Right lower leg: 2+ Edema present.     Left lower leg: 2+ Edema present.  Skin:    General: Skin is warm and dry.  Neurological:     Mental Status: She is alert and oriented to person, place, and time.  Psychiatric:        Attention and Perception: Attention normal.        Mood and Affect: Mood normal.        Speech: Speech normal.        Behavior: Behavior normal. Behavior is cooperative.        Thought Content: Thought content normal.   Results for orders placed or performed in visit on 11/25/20  Wound culture   Specimen: Wound   WO  Result Value Ref Range   Gram Stain Result Final report    Organism ID, Bacteria Comment    Organism ID, Bacteria Comment    Aerobic Bacterial Culture Final report (A)    Organism ID, Bacteria Staphylococcus aureus (A)    Antimicrobial Susceptibility Comment       Assessment & Plan:   Problem List Items Addressed This Visit       Cardiovascular and Mediastinum   Essential hypertension    Ongoing with BP at goal today in office.   Recommend she monitor BP at home regularly and document.  Will continue Losartan 100 MG daily,  Metoprolol, and Lasix 20 MG to take as needed for edema.  Would avoid Amlodipine due to baseline edema to BLE with lymphedema. Focus on DASH diet at home.  Return in 6 months.  Recommend: - Reminded to call for an overnight weight gain of >2 pounds or a weekly weight weight of >5 pounds - not adding salt to his food and has been reading food labels. Reviewed the importance of keeping daily sodium intake to 2000mg  daily       Relevant Medications   apixaban (ELIQUIS) 5 MG TABS tablet   atorvastatin (LIPITOR) 40 MG tablet   losartan (COZAAR) 100 MG tablet   Other Relevant Orders   Microalbumin, Urine Waived   Comprehensive metabolic panel   Mild mitral regurgitation by prior echocardiogram    Followed by cardiology, continue this collaboration and current medication regimen.      Relevant Medications   apixaban (ELIQUIS) 5 MG TABS tablet   atorvastatin (LIPITOR) 40 MG tablet   losartan (COZAAR) 100 MG tablet   Mild tricuspid regurgitation by prior echocardiogram    Mild to Moderate on echo July 2022.  Followed by cardiology, continue this collaboration and current medication regimen.      Relevant Medications   apixaban (ELIQUIS) 5 MG TABS tablet   atorvastatin (LIPITOR) 40 MG tablet   losartan (COZAAR) 100 MG tablet   SVT (supraventricular tachycardia) ([place]) - Primary    Ongoing and followed by cardiology, will continue Metoprolol as ordered by them.  Recent notes and testing reviewed.  She has no symptoms at this time.      Relevant Medications   apixaban (ELIQUIS) 5 MG TABS tablet   atorvastatin (LIPITOR) 40 MG tablet   losartan (COZAAR) 100 MG tablet     Musculoskeletal and Integument   Lower limb ulcer, calf, left, limited to breakdown of skin ([place])    Overall healed at this time.  Continue to collaborate with vascular as needed and use compression pumps at home.        Other    BMI 50.0-59.9, adult (HCC)    Recommended eating smaller high protein, low fat meals more frequently and exercising 30 mins a day 5 times a week with a goal of 10-15lb weight loss in the next 3 months. Patient voiced their understanding and motivation to adhere to these recommendations.       History of pulmonary embolus (PE)    With Covid in September 2021.  At this time will continue Eliquis and collaboration with vascular + cardiology.  Refills sent in.  Cardiology recommends lifelong treatment.      Hyperlipidemia    Chronic, ongoing.  Continue current medication regimen and adjust as needed, Atorvastatin 40 MG.  Lipid panel today.      Relevant Medications   apixaban (ELIQUIS) 5 MG TABS tablet   atorvastatin (LIPITOR) 40 MG tablet   losartan (COZAAR) 100 MG tablet   Other Relevant Orders   Lipid Panel w/o Chol/HDL Ratio   Lymphedema    Chronic, stable.   Continue to collaborate with vascular as needed and use compression pumps at home.      Morbid obesity due to excess calories (HCC)    BMI 52.26, has lost some since last visit.  Recommended eating smaller high protein, low fat meals more frequently and exercising 30 mins a day 5 times a week with a goal of 10-15lb weight loss in the next 3 months. Patient voiced their understanding and motivation to adhere to these  recommendations.       Vitamin D deficiency    Chronic, stable.  Recommend she continue supplement Vitamin D3 1000 units daily for bone health.  Recheck level today and recommend she scheduled DEXA.      Relevant Orders   VITAMIN D 25 Hydroxy (Vit-D Deficiency, Fractures)   Other Visit Diagnoses     IFG (impaired fasting glucose)       A1c check today   Relevant Orders   Bayer DCA Hb A1c Waived   Microalbumin, Urine Waived   Postmenopausal estrogen deficiency       DEXA ordered   Relevant Orders   DG Bone Density   Pneumococcal vaccination given       PCV13 provided today.   Relevant Orders    Pneumococcal conjugate vaccine 13-valent (Completed)        Follow up plan: Return in about 6 months (around 08/08/2021) for Annual physical.

## 2021-02-08 NOTE — Assessment & Plan Note (Addendum)
Chronic, stable.  Recommend she continue supplement Vitamin D3 1000 units daily for bone health.  Recheck level today and recommend she scheduled DEXA.

## 2021-02-08 NOTE — Assessment & Plan Note (Signed)
BMI 52.26, has lost some since last visit.  Recommended eating smaller high protein, low fat meals more frequently and exercising 30 mins a day 5 times a week with a goal of 10-15lb weight loss in the next 3 months. Patient voiced their understanding and motivation to adhere to these recommendations.

## 2021-02-08 NOTE — Patient Instructions (Signed)
Please call to schedule your bone density: [facility]  Address: [address], [employer], [place] [address]  Phone: [phone]   Pneumococcal Conjugate Vaccine: What You Need to Know 1. Why get vaccinated? Pneumococcal conjugate vaccine can prevent pneumococcal disease. Pneumococcal disease refers to any illness caused by pneumococcal bacteria. These bacteria can cause many types of illnesses, including pneumonia, which is an infection of the lungs. Pneumococcal bacteria are one of the most common causes of pneumonia. Besides pneumonia, pneumococcal bacteria can also cause: Ear infections Sinus infections Meningitis (infection of the tissue covering the brain and spinal cord) Bacteremia (infection of the blood) Anyone can get pneumococcal disease, but children under 23 years old, people with certain medical conditions or other risk factors, and adults 16 years or older are at the highest risk. Most pneumococcal infections are mild. However, some can result in long-term problems, such as brain damage or hearing loss. Meningitis, bacteremia, and pneumonia caused by pneumococcal disease can be fatal. 2. Pneumococcal conjugate vaccine Pneumococcal conjugate vaccine helps protect against bacteria that cause pneumococcal disease. There are three pneumococcal conjugate vaccines (PCV13, PCV15, and PCV20). The different vaccines are recommended for different people based on their age and medical status. PCV13 Infants and young children usually need 4 doses of PCV13, at ages 38, 18, 51, and 12-15 months. Older children (through age 12 months) may be vaccinated with PCV13 if they did not receive the recommended doses. Children and adolescents 2-67 years of age with certain medical conditions should receive a single dose of PCV13 if they did not already receive PCV13. PCV15 or PCV20 Adults 65 through 68 years old with certain medical conditions or other risk factors  who have not already received a pneumococcal conjugate vaccine should receive either: a single dose of PCV15 followed by a dose of pneumococcal polysaccharide vaccine (PPSV23), or a single dose of PCV20. Adults 37 years or older who have not already received a pneumococcal conjugate vaccine should receive either: a single dose of PCV15 followed by a dose of PPSV23, or a single dose of PCV20. Your health care provider can give you more information. 3. Talk with your health care provider Tell your vaccination provider if the person getting the vaccine: Has had an allergic reaction after a previous dose of any type of pneumococcal conjugate vaccine (PCV13, PCV15, PCV20, or an earlier pneumococcal conjugate vaccine known as PCV7), or to any vaccine containing diphtheria toxoid (for example, DTaP), or has any severe, life-threatening allergies In some cases, your health care provider may decide to postpone pneumococcal conjugate vaccination until a future visit. People with minor illnesses, such as a cold, may be vaccinated. People who are moderately or severely ill should usually wait until they recover. Your health care provider can give you more information. 4. Risks of a vaccine reaction Redness, swelling, pain, or tenderness where the shot is given, and fever, loss of appetite, fussiness (irritability), feeling tired, headache, muscle aches, joint pain, and chills can happen after pneumococcal conjugate vaccination. Young children may be at increased risk for seizures caused by fever after PCV13 if it is administered at the same time as inactivated influenza vaccine. Ask your health care provider for more information. People sometimes faint after medical procedures, including vaccination. Tell your provider if you feel dizzy or have vision changes or ringing in the ears. As with any medicine, there is a very remote chance of a vaccine causing a severe allergic reaction, other serious injury, or  death. 5. What if there is a serious problem? An allergic reaction could occur after the vaccinated person leaves the clinic. If you see signs of a severe allergic reaction (hives, swelling of the face and throat, difficulty breathing, a fast heartbeat, dizziness, or weakness), call 9-1-1 and get the person to the nearest hospital. For other signs that concern you, call your health care provider. Adverse reactions should be reported to the Vaccine Adverse Event Reporting System (VAERS). Your health care provider will usually file this report, or you can do it yourself. Visit the VAERS website at www.vaers.SamedayNews.es or call 269-780-8859. VAERS is only for reporting reactions, and VAERS staff members do not give medical advice. 6. The National Vaccine Injury Compensation Program The [employer] Vaccine Injury Compensation Program (VICP) is a federal program that was created to compensate people who may have been injured by certain vaccines. Claims regarding alleged injury or death due to vaccination have a time limit for filing, which may be as short as two years. Visit the VICP website at GoldCloset.com.ee or call (938)458-7153 to learn about the program and about filing a claim. 7. How can I learn more? Ask your health care provider. Call your local or state health department. Visit the website of the Food and Drug Administration (FDA) for vaccine package inserts and additional information at TraderRating.uy. Contact the Centers for Disease Control and Prevention (CDC): Call (571) 477-5632 (1-800-CDC-INFO) or Visit CDC's website at http://hunter.com/. Vaccine Information Statement (Interim) Pneumococcal Conjugate Vaccine (03/06/2020) This information is not intended to replace advice given to you by your health care provider. Make sure you discuss any questions you have with your health care provider. Document Revised: 10/02/2020 Document Reviewed:  03/20/2020 Elsevier Patient Education  2022 [employer].

## 2021-02-09 LAB — COMPREHENSIVE METABOLIC PANEL
ALT: 12 IU/L (ref 0–32)
AST: 15 IU/L (ref 0–40)
Albumin/Globulin Ratio: 1.9 (ref 1.2–2.2)
Albumin: 4.3 g/dL (ref 3.8–4.8)
Alkaline Phosphatase: 89 IU/L (ref 44–121)
BUN/Creatinine Ratio: 21 (ref 12–28)
BUN: 15 mg/dL (ref 8–27)
Bilirubin Total: 0.7 mg/dL (ref 0.0–1.2)
CO2: 29 mmol/L (ref 20–29)
Calcium: 9.2 mg/dL (ref 8.7–10.3)
Chloride: 102 mmol/L (ref 96–106)
Creatinine, Ser: 0.71 mg/dL (ref 0.57–1.00)
Globulin, Total: 2.3 g/dL (ref 1.5–4.5)
Glucose: 92 mg/dL (ref 70–99)
Potassium: 3.9 mmol/L (ref 3.5–5.2)
Sodium: 142 mmol/L (ref 134–144)
Total Protein: 6.6 g/dL (ref 6.0–8.5)
eGFR: 93 mL/min/{1.73_m2} (ref >59)

## 2021-02-09 LAB — VITAMIN D 25 HYDROXY (VIT D DEFICIENCY, FRACTURES): Vit D, 25-Hydroxy: 50.1 ng/mL (ref 30.0–100.0)

## 2021-02-09 LAB — LIPID PANEL W/O CHOL/HDL RATIO
Cholesterol, Total: 131 mg/dL (ref 100–199)
HDL: 46 mg/dL (ref >39)
LDL Chol Calc (NIH): 64 mg/dL (ref 0–99)
Triglycerides: 116 mg/dL (ref 0–149)
VLDL Cholesterol Cal: 21 mg/dL (ref 5–40)

## 2021-02-09 NOTE — Progress Notes (Signed)
Good morning crew, please let Jasmine Webb know her labs have returned and overall everything looks stable with exception of diabetes testing.  The A1C is the diabetes testing we talked about, this looks at your blood sugars over the past 3 months and turns the average into a number.  Your number is 6.0%, meaning you are prediabetic.  Any number 5.7 to 6.4 is considered prediabetes and any number 6.5 or greater is considered diabetes.   I would recommend heavy focus on decreasing foods high in sugar and your intake of things like bread products, pasta, and rice.  The American Diabetes Association online has a large amount of information on diet changes to make.  We will recheck this number in 3-6 months to ensure you are not continuing to trend upwards and move into diabetes.  Any questions?   Keep being amazing!!  Thank you for allowing me to participate in your care.  I appreciate you. Kindest regards, Zyir Gassert

## 2021-03-25 ENCOUNTER — Other Ambulatory Visit: Payer: Self-pay

## 2021-03-25 ENCOUNTER — Encounter (INDEPENDENT_AMBULATORY_CARE_PROVIDER_SITE_OTHER): Payer: Self-pay | Admitting: Nurse Practitioner

## 2021-03-25 ENCOUNTER — Ambulatory Visit (INDEPENDENT_AMBULATORY_CARE_PROVIDER_SITE_OTHER): Payer: Medicare Other | Admitting: Nurse Practitioner

## 2021-03-25 VITALS — BP 118/72 | HR 101 | Ht 64.0 in | Wt 284.0 lb

## 2021-03-25 DIAGNOSIS — E785 Hyperlipidemia, unspecified: Secondary | ICD-10-CM | POA: Diagnosis not present

## 2021-03-25 DIAGNOSIS — I10 Essential (primary) hypertension: Secondary | ICD-10-CM | POA: Diagnosis not present

## 2021-03-25 DIAGNOSIS — I890 Lymphedema, not elsewhere classified: Secondary | ICD-10-CM | POA: Diagnosis not present

## 2021-04-01 ENCOUNTER — Other Ambulatory Visit: Payer: Self-pay

## 2021-04-01 ENCOUNTER — Ambulatory Visit (INDEPENDENT_AMBULATORY_CARE_PROVIDER_SITE_OTHER): Payer: Medicare Other | Admitting: Family Medicine

## 2021-04-01 ENCOUNTER — Ambulatory Visit: Payer: Self-pay | Admitting: *Deleted

## 2021-04-01 ENCOUNTER — Encounter: Payer: Self-pay | Admitting: Family Medicine

## 2021-04-01 VITALS — BP 126/83 | HR 97 | Temp 98.3°F | Wt 293.8 lb

## 2021-04-01 DIAGNOSIS — R1011 Right upper quadrant pain: Secondary | ICD-10-CM

## 2021-04-01 DIAGNOSIS — R112 Nausea with vomiting, unspecified: Secondary | ICD-10-CM

## 2021-04-01 NOTE — Progress Notes (Signed)
? ?BP 126/83   Pulse 97   Temp 98.3 ?F (36.8 ?C) (Oral)   Wt 293 lb 12.8 oz (133.3 kg)   LMP  (LMP Unknown)   SpO2 95%   BMI 50.43 kg/m?   ? ?Subjective:  ? ? Patient ID: Jasmine Webb, female    DOB: 03/15/1955, 66 y.o.   MRN: [id number] ? ?HPI: ?Jasmine Webb is a 66 y.o. female ? ?Chief Complaint  ?Patient presents with  ? GI Problem  ?  Pt states she started having issues with her stomach the past few days. States she started taking a new fiber gummy last month and wonders if it could be these or if it could be from eating onions. States that she has felt nauseous and gotten sick twice now after onion, on two different occasions   ? ?GASTROENTERITIS ?Duration: 4 days ?Diarrhea: no   ?Nausea: yes ?Vomiting: yes ?Episodes of vomit/day: several times a day ?Abdominal pain: gassy crampy ?Fever: no ?Decreased appetite: yes ?Tolerating liquids: yes ?Foreign travel: no ?Similar illness in contacts: no ?Recent antibiotic use: no ?Status: better ?Treatments attempted:  ? ?Relevant past medical, surgical, family and social history reviewed and updated as indicated. Interim medical history since our last visit reviewed. ?Allergies and medications reviewed and updated. ? ?Review of Systems  ?Constitutional: Negative.   ?Respiratory: Negative.    ?Cardiovascular: Negative.   ?Gastrointestinal: Negative.   ?Musculoskeletal: Negative.   ?Psychiatric/Behavioral: Negative.    ? ?Per HPI unless specifically indicated above ? ?   ?Objective:  ?  ?BP 126/83   Pulse 97   Temp 98.3 ?F (36.8 ?C) (Oral)   Wt 293 lb 12.8 oz (133.3 kg)   LMP  (LMP Unknown)   SpO2 95%   BMI 50.43 kg/m?   ?Wt Readings from Last 3 Encounters:  ?04/01/21 293 lb 12.8 oz (133.3 kg)  ?03/25/21 284 lb (128.8 kg)  ?02/08/21 295 lb (133.8 kg)  ?  ?Physical Exam ?Vitals and nursing note reviewed.  ?Constitutional:   ?   General: She is not in acute distress. ?   Appearance: Normal appearance. She is not ill-appearing, toxic-appearing or diaphoretic.  ?HENT:   ?   Head: Normocephalic and atraumatic.  ?   Right Ear: External ear normal.  ?   Left Ear: External ear normal.  ?   Nose: Nose normal.  ?   Mouth/Throat:  ?   Mouth: Mucous membranes are moist.  ?   Pharynx: Oropharynx is clear.  ?Eyes:  ?   General: No scleral icterus.    ?   Right eye: No discharge.     ?   Left eye: No discharge.  ?   Extraocular Movements: Extraocular movements intact.  ?   Conjunctiva/sclera: Conjunctivae normal.  ?   Pupils: Pupils are equal, round, and reactive to light.  ?Cardiovascular:  ?   Rate and Rhythm: Normal rate and regular rhythm.  ?   Pulses: Normal pulses.  ?   Heart sounds: Normal heart sounds. No murmur heard. ?  No friction rub. No gallop.  ?Pulmonary:  ?   Effort: Pulmonary effort is normal. No respiratory distress.  ?   Breath sounds: Normal breath sounds. No stridor. No wheezing, rhonchi or rales.  ?Chest:  ?   Chest wall: No tenderness.  ?Musculoskeletal:     ?   General: Normal range of motion.  ?   Cervical back: Normal range of motion and neck supple.  ?Skin: ?   General: Skin  is warm and dry.  ?   Capillary Refill: Capillary refill takes less than 2 seconds.  ?   Coloration: Skin is not jaundiced or pale.  ?   Findings: No bruising, erythema, lesion or rash.  ?Neurological:  ?   General: No focal deficit present.  ?   Mental Status: She is alert and oriented to person, place, and time. Mental status is at baseline.  ?Psychiatric:     ?   Mood and Affect: Mood normal.     ?   Behavior: Behavior normal.     ?   Thought Content: Thought content normal.     ?   Judgment: Judgment normal.  ? ? ?Results for orders placed or performed in visit on 02/08/21  ?Bayer DCA Hb A1c Waived  ?Result Value Ref Range  ? HB A1C (BAYER DCA - WAIVED) 6.0 (H) 4.8 - 5.6 %  ?Microalbumin, Urine Waived  ?Result Value Ref Range  ? Microalb, Ur Waived 10 0 - 19 mg/L  ? Creatinine, Urine Waived 50 10 - 300 mg/dL  ? Microalb/Creat Ratio <30 <30 mg/g  ?Comprehensive metabolic panel  ?Result Value  Ref Range  ? Glucose 92 70 - 99 mg/dL  ? BUN 15 8 - 27 mg/dL  ? Creatinine, Ser 0.71 0.57 - 1.00 mg/dL  ? eGFR 93 >59 mL/min/1.73  ? BUN/Creatinine Ratio 21 12 - 28  ? Sodium 142 134 - 144 mmol/L  ? Potassium 3.9 3.5 - 5.2 mmol/L  ? Chloride 102 96 - 106 mmol/L  ? CO2 29 20 - 29 mmol/L  ? Calcium 9.2 8.7 - 10.3 mg/dL  ? Total Protein 6.6 6.0 - 8.5 g/dL  ? Albumin 4.3 3.8 - 4.8 g/dL  ? Globulin, Total 2.3 1.5 - 4.5 g/dL  ? Albumin/Globulin Ratio 1.9 1.2 - 2.2  ? Bilirubin Total 0.7 0.0 - 1.2 mg/dL  ? Alkaline Phosphatase 89 44 - 121 IU/L  ? AST 15 0 - 40 IU/L  ? ALT 12 0 - 32 IU/L  ?Lipid Panel w/o Chol/HDL Ratio  ?Result Value Ref Range  ? Cholesterol, Total 131 100 - 199 mg/dL  ? Triglycerides 116 0 - 149 mg/dL  ? HDL 46 >39 mg/dL  ? VLDL Cholesterol Cal 21 5 - 40 mg/dL  ? LDL Chol Calc (NIH) 64 0 - 99 mg/dL  ?VITAMIN D 25 Hydroxy (Vit-D Deficiency, Fractures)  ?Result Value Ref Range  ? Vit D, 25-Hydroxy 50.1 30.0 - 100.0 ng/mL  ? ?   ?Assessment & Plan:  ? ?Problem List Items Addressed This Visit   ?None ?Visit Diagnoses   ? ? Nausea and vomiting, unspecified vomiting type    -  Primary  ? Resolved. Continue to monitor. Call with any concerns or if getting worse. Offered phenergan suppository- declined. Will give epley's for vertigo.   ? RUQ pain      ? Likely muscular due to retching. Will continue to monitor. If not better by Monday, consider [place].  ? ?  ?  ? ?Follow up plan: ?Return if symptoms worsen or fail to improve. ? ? ? ? ? ?

## 2021-04-01 NOTE — Telephone Encounter (Signed)
Summary: vertigo/not new but needs advice  ? Pt had episode of vertigo last night.  It happened about 2 weeks ago too.  Both times after eating an onion.  Pt has had this in the past.  She wonders if it may be her gallbladder.  Pt declined appt at this time, until she speak w/ nurse to point her in the best direction.  She said last night vertigo lasted from bedtime until about 1:30 am. Would like to discuss.   ?  ? ? ?Chief Complaint: dizziness ?Symptoms: dizzy,vomiting ?Frequency: during the night last night again (third time) ?Pertinent Negatives: Patient denies ear symptoms ?Disposition: [] ED /[] Urgent Care (no appt availability in office) / [x] Appointment(In office/virtual)/ []  [facility]/ [] Home Care/ [] Refused Recommended Disposition /[] [facility]/ []  Follow-up with PCP ?Additional Notes: Pt states able to drive self, advised to have driver. ? ?Reason for Disposition ? [1] MODERATE dizziness (e.g., vertigo; feels very unsteady, interferes with normal activities) AND [2] has NOT been evaluated by physician for this ? ?Answer Assessment - Initial Assessment Questions ?1. DESCRIPTION: "Describe your dizziness." ?    Several episodes dizziness ?2. VERTIGO: "Do you feel like either you or the room is spinning or tilting?"  ?    spinning ?3. LIGHTHEADED: "Do you feel lightheaded?" (e.g., somewhat faint, woozy, weak upon standing) ?    Sharp pain in head, above right ear ?4. SEVERITY: "How bad is it?"  "Can you walk?" ?  - MILD: Feels slightly dizzy and unsteady, but is walking normally. ?  - MODERATE: Feels unsteady when walking, but not falling; interferes with normal activities (e.g., school, work). ?  - SEVERE: Unable to walk without falling, or requires assistance to walk without falling. ?    Moderate pain, dizziness severe ?5. ONSET:  "When did the dizziness begin?" ?    Last night at 1030 pm ?6. AGGRAVATING FACTORS: "Does anything make it worse?" (e.g., standing, change in head  position) ?    Onions made vomiting, thinks that what causes it ?7. CAUSE: "What do you think is causing the dizziness?" ?    Gallbladder issues and vomiting ?8. RECURRENT SYMPTOM: "Have you had dizziness before?" If Yes, ask: "When was the last time?" "What happened that time?" ?    This is third time. ?9. OTHER SYMPTOMS: "Do you have any other symptoms?" (e.g., headache, weakness, numbness, vomiting, earache) ?    no ?10. PREGNANCY: "Is there any chance you are pregnant?" "When was your last menstrual period?" ?      na ? ?Protocols used: Dizziness - Vertigo-A-AH ? ?

## 2021-04-04 NOTE — Progress Notes (Signed)
Subjective:    Patient ID: Jasmine Webb, female    DOB: 01-03-1956, 66 y.o.   MRN: [id number] Chief Complaint  Patient presents with   Follow-up    3 Mo  no studies    The patient returns to the office for followup evaluation regarding leg swelling.  The swelling has improved quite a bit and the pain associated with swelling has decreased substantially. There have not been any interval development of a ulcerations or wounds.  Since the previous visit the patient has been wearing graduated compression stockings and has noted little significant improvement in the lymphedema. The patient has been using compression routinely morning until night.  The patient also states elevation during the day and exercise is being done too.        Review of Systems  Cardiovascular:  Negative for leg swelling.  All other systems reviewed and are negative.     Objective:   Physical Exam Vitals reviewed.  HENT:     Head: Normocephalic.  Cardiovascular:     Rate and Rhythm: Normal rate.     Pulses: Normal pulses.  Pulmonary:     Effort: Pulmonary effort is normal.  Skin:    General: Skin is warm and dry.  Neurological:     Mental Status: She is alert and oriented to person, place, and time.  Psychiatric:        Mood and Affect: Mood normal.        Behavior: Behavior normal.        Thought Content: Thought content normal.        Judgment: Judgment normal.    BP 118/72    Pulse (!) 101    Ht '5\' 4"'$  (1.626 m)    Wt 284 lb (128.8 kg)    LMP  (LMP Unknown)    BMI 48.75 kg/m   Past Medical History:  Diagnosis Date   Arthritis    Hyperlipidemia    Hypertension    Tumor    breast and pelvic   Vertigo     Social History   Socioeconomic History   Marital status: Divorced    Spouse name: Not on file   Number of children: Not on file   Years of education: Not on file   Highest education level: Not on file  Occupational History   Not on file  Tobacco Use   Smoking status: Never    Smokeless tobacco: Never  Vaping Use   Vaping Use: Never used  Substance and Sexual Activity   Alcohol use: Never   Drug use: Never   Sexual activity: Not Currently  Other Topics Concern   Not on file  Social History Narrative   Not on file   Social Determinants of Health   Financial Resource Strain: Low Risk    Difficulty of Paying Living Expenses: Not hard at all  Food Insecurity: No Food Insecurity   Worried About Charity fundraiser in the Last Year: Never true   Ran Out of Food in the Last Year: Never true  Transportation Needs: No Transportation Needs   Lack of Transportation (Medical): No   Lack of Transportation (Non-Medical): No  Physical Activity: Insufficiently Active   Days of Exercise per Week: 3 days   Minutes of Exercise per Session: 40 min  Stress: No Stress Concern Present   Feeling of Stress : Not at all  Social Connections: Moderately Isolated   Frequency of Communication with Friends and Family: More than three times a  week   Frequency of Social Gatherings with Friends and Family: More than three times a week   Attends Religious Services: More than 4 times per year   Active Member of Clubs or Organizations: No   Attends Archivist Meetings: Never   Marital Status: Divorced  Human resources officer Violence: Not At Risk   Fear of Current or Ex-Partner: No   Emotionally Abused: No   Physically Abused: No   Sexually Abused: No    Past Surgical History:  Procedure Laterality Date   ABDOMINAL HYSTERECTOMY  2003   BREAST SURGERY     PULMONARY THROMBECTOMY N/A 10/10/2019   Procedure: PULMONARY THROMBECTOMY;  Surgeon: Algernon Huxley, MD;  Location: [facility];  Service: Cardiovascular;  Laterality: N/A;    Family History  Problem Relation Age of Onset   COPD Mother    ALS Father    Heart disease Sister    Breast cancer Sister    Colon cancer Sister    Diabetes Brother    Hypertension Brother    Asthma Son    Sinusitis Son    Congestive  Heart Failure Maternal Uncle     Allergies  Allergen Reactions   Ibuprofen     Told to avoid ibuprofen related products by doctor.    CBC Latest Ref Rng & Units 11/13/2020 06/23/2020 11/19/2019  WBC 4.0 - 10.5 K/uL 9.5 6.1 7.3  Hemoglobin 12.0 - 15.0 g/dL 12.4 13.8 12.5  Hematocrit 36.0 - 46.0 % 36.6 42.1 37.7  Platelets 150 - 400 K/uL 217 189 223      CMP     Component Value Date/Time   NA 142 02/08/2021 1622   K 3.9 02/08/2021 1622   CL 102 02/08/2021 1622   CO2 29 02/08/2021 1622   GLUCOSE 92 02/08/2021 1622   GLUCOSE 122 (H) 11/13/2020 2052   BUN 15 02/08/2021 1622   CREATININE 0.71 02/08/2021 1622   CALCIUM 9.2 02/08/2021 1622   PROT 6.6 02/08/2021 1622   ALBUMIN 4.3 02/08/2021 1622   AST 15 02/08/2021 1622   ALT 12 02/08/2021 1622   ALKPHOS 89 02/08/2021 1622   BILITOT 0.7 02/08/2021 1622   GFRNONAA >60 11/13/2020 2052   GFRAA 111 01/07/2020 0832     No results found.     Assessment & Plan:   1. Lymphedema No surgery or intervention at this point in time.  I have reviewed my discussion with the patient regarding venous insufficiency and why it causes symptoms. I have discussed with the patient the chronic skin changes that accompany venous insufficiency and the long term sequela such as ulceration. Patient will contnue wearing graduated compression stockings on a daily basis, as this has provided excellent control of his edema. The patient will put the stockings on first thing in the morning and removing them in the evening. The patient is reminded not to sleep in the stockings.  In addition, behavioral modification including elevation during the day will be initiated. Exercise is strongly encouraged.  Given the patient's good control and lack of any problems regarding the venous insufficiency and lymphedema a lymph pump in not need at this time.  The patient will follow up with me PRN should anything change.  The patient voices agreement with this plan.    2. Essential hypertension Continue antihypertensive medications as already ordered, these medications have been reviewed and there are no changes at this time.   3. Hyperlipidemia, unspecified hyperlipidemia type Continue statin as ordered and reviewed,  no changes at this time    Current Outpatient Medications on File Prior to Visit  Medication Sig Dispense Refill   acetaminophen (TYLENOL) 500 MG tablet Take 500 mg by mouth every 6 (six) hours as needed.     apixaban (ELIQUIS) 5 MG TABS tablet Take 1 tablet (5 mg total) by mouth 2 (two) times daily. 180 tablet 4   atorvastatin (LIPITOR) 40 MG tablet Take 1 tablet (40 mg total) by mouth daily. 90 tablet 4   FIBER PO Take 1 each by mouth daily.     fluticasone (FLONASE) 50 MCG/ACT nasal spray Place 2 sprays into both nostrils daily.     furosemide (LASIX) 20 MG tablet TAKE 1 TABLET BY MOUTH TWICE A DAY AS NEEDED FOR EDEMA 180 tablet 2   levocetirizine (XYZAL) 5 MG tablet TAKE 1 TABLET BY MOUTH EVERY DAY IN THE EVENING 90 tablet 4   losartan (COZAAR) 100 MG tablet Take 1 tablet (100 mg total) by mouth daily. 90 tablet 4   metoprolol tartrate (LOPRESSOR) 25 MG tablet TAKE 1 TABLET BY MOUTH TWICE A DAY 180 tablet 2   Multiple Vitamins-Minerals (MULTIVITAMIN GUMMIES ADULT PO) Take by mouth daily.     No current facility-administered medications on file prior to visit.    There are no Patient Instructions on file for this visit. No follow-ups on file.   Kris Hartmann, NP

## 2021-04-05 ENCOUNTER — Encounter (INDEPENDENT_AMBULATORY_CARE_PROVIDER_SITE_OTHER): Payer: Self-pay | Admitting: Nurse Practitioner

## 2021-04-05 ENCOUNTER — Ambulatory Visit: Payer: Medicare Other | Admitting: Internal Medicine

## 2021-04-05 ENCOUNTER — Encounter: Payer: Self-pay | Admitting: Internal Medicine

## 2021-04-05 ENCOUNTER — Other Ambulatory Visit: Payer: Self-pay

## 2021-04-05 VITALS — BP 119/73 | HR 62 | Temp 97.9°F | Ht 64.02 in | Wt 293.8 lb

## 2021-04-05 DIAGNOSIS — J019 Acute sinusitis, unspecified: Secondary | ICD-10-CM | POA: Insufficient documentation

## 2021-04-05 MED ORDER — METHYLPREDNISOLONE 4 MG PO TBPK: ORAL_TABLET | ORAL | 0 refills | Status: DC

## 2021-04-05 MED ORDER — AZITHROMYCIN 250 MG PO TABS: ORAL_TABLET | ORAL | 0 refills | Status: AC

## 2021-04-05 MED ORDER — FEXOFENADINE HCL 180 MG PO TABS: 180.0000 mg | ORAL_TABLET | Freq: Every day | ORAL | 1 refills | Status: DC

## 2021-04-05 NOTE — Progress Notes (Signed)
? ?BP 119/73   Pulse 62   Temp 97.9 ?F (36.6 ?C) (Oral)   Ht 5' 4.02" (1.626 m)   Wt 293 lb 12.8 oz (133.3 kg)   LMP  (LMP Unknown)   SpO2 94%   BMI 50.41 kg/m?   ? ?Subjective:  ? ? Patient ID: Jasmine Webb, female    DOB: [birth year], 66 y.o.   MRN: [id number] ? ?Chief Complaint  ?Patient presents with  ?? Ear Pain  ?  Right ear pain started on Friday  ?? Head congestion  ?  Started on Friday  ? ? ?HPI: ?Jasmine Webb is a 66 y.o. female ? ?Patient presents with: ?Ear Pain: Right ear pain started on Friday ?Head congestion: Started on Friday ? ? ? ?Sinus Problem ?This is a new problem. The current episode started yesterday. There has been no fever. The pain is severe. Associated symptoms include congestion, ear pain, headaches, a hoarse voice, sinus pressure and a sore throat. Pertinent negatives include no chills, coughing, diaphoresis, neck pain, shortness of breath, sneezing or swollen glands. (Post nasal drip)  ? ?Chief Complaint  ?Patient presents with  ?? Ear Pain  ?  Right ear pain started on Friday  ?? Head congestion  ?  Started on Friday  ? ? ?Relevant past medical, surgical, family and social history reviewed and updated as indicated. Interim medical history since our last visit reviewed. ?Allergies and medications reviewed and updated. ? ?Review of Systems  ?Constitutional:  Negative for chills and diaphoresis.  ?HENT:  Positive for congestion, ear pain, hoarse voice, sinus pressure and sore throat. Negative for sneezing.   ?Respiratory:  Negative for cough and shortness of breath.   ?Musculoskeletal:  Negative for neck pain.  ?Neurological:  Positive for headaches.  ? ?Per HPI unless specifically indicated above ? ?   ?Objective:  ?  ?BP 119/73   Pulse 62   Temp 97.9 ?F (36.6 ?C) (Oral)   Ht 5' 4.02" (1.626 m)   Wt 293 lb 12.8 oz (133.3 kg)   LMP  (LMP Unknown)   SpO2 94%   BMI 50.41 kg/m?   ?Wt Readings from Last 3 Encounters:  ?04/05/21 293 lb 12.8 oz (133.3 kg)  ?04/01/21 293 lb 12.8 oz  (133.3 kg)  ?03/25/21 284 lb (128.8 kg)  ?  ?Physical Exam ?Vitals and nursing note reviewed.  ?Constitutional:   ?   General: She is not in acute distress. ?   Appearance: Normal appearance. She is not ill-appearing or diaphoretic.  ?Eyes:  ?   Conjunctiva/sclera: Conjunctivae normal.  ?Cardiovascular:  ?   Rate and Rhythm: Normal rate and regular rhythm.  ?Pulmonary:  ?   Effort: No respiratory distress.  ?   Breath sounds: No stridor. Wheezing present. No rhonchi or rales.  ?Chest:  ?   Chest wall: No tenderness.  ?Abdominal:  ?   General: There is no distension.  ?   Palpations: There is no mass.  ?   Tenderness: There is no abdominal tenderness. There is no guarding.  ?Skin: ?   General: Skin is warm and dry.  ?   Coloration: Skin is not jaundiced.  ?   Findings: No erythema.  ?Neurological:  ?   Mental Status: She is alert.  ? ? ?Results for orders placed or performed in visit on 02/08/21  ?Bayer DCA Hb A1c Waived  ?Result Value Ref Range  ? HB A1C (BAYER DCA - WAIVED) 6.0 (H) 4.8 - 5.6 %  ?Microalbumin, Urine  Waived  ?Result Value Ref Range  ? Microalb, Ur Waived 10 0 - 19 mg/L  ? Creatinine, Urine Waived 50 10 - 300 mg/dL  ? Microalb/Creat Ratio <30 <30 mg/g  ?Comprehensive metabolic panel  ?Result Value Ref Range  ? Glucose 92 70 - 99 mg/dL  ? BUN 15 8 - 27 mg/dL  ? Creatinine, Ser 0.71 0.57 - 1.00 mg/dL  ? eGFR 93 >59 mL/min/1.73  ? BUN/Creatinine Ratio 21 12 - 28  ? Sodium 142 134 - 144 mmol/L  ? Potassium 3.9 3.5 - 5.2 mmol/L  ? Chloride 102 96 - 106 mmol/L  ? CO2 29 20 - 29 mmol/L  ? Calcium 9.2 8.7 - 10.3 mg/dL  ? Total Protein 6.6 6.0 - 8.5 g/dL  ? Albumin 4.3 3.8 - 4.8 g/dL  ? Globulin, Total 2.3 1.5 - 4.5 g/dL  ? Albumin/Globulin Ratio 1.9 1.2 - 2.2  ? Bilirubin Total 0.7 0.0 - 1.2 mg/dL  ? Alkaline Phosphatase 89 44 - 121 IU/L  ? AST 15 0 - 40 IU/L  ? ALT 12 0 - 32 IU/L  ?Lipid Panel w/o Chol/HDL Ratio  ?Result Value Ref Range  ? Cholesterol, Total 131 100 - 199 mg/dL  ? Triglycerides 116 0 - 149  mg/dL  ? HDL 46 >39 mg/dL  ? VLDL Cholesterol Cal 21 5 - 40 mg/dL  ? LDL Chol Calc (NIH) 64 0 - 99 mg/dL  ?VITAMIN D 25 Hydroxy (Vit-D Deficiency, Fractures)  ?Result Value Ref Range  ? Vit D, 25-Hydroxy 50.1 30.0 - 100.0 ng/mL  ? ?   ? ? ?Current Outpatient Medications:  ??  acetaminophen (TYLENOL) 500 MG tablet, Take 500 mg by mouth every 6 (six) hours as needed., Disp: , Rfl:  ??  apixaban (ELIQUIS) 5 MG TABS tablet, Take 1 tablet (5 mg total) by mouth 2 (two) times Webb., Disp: 180 tablet, Rfl: 4 ??  atorvastatin (LIPITOR) 40 MG tablet, Take 1 tablet (40 mg total) by mouth Webb., Disp: 90 tablet, Rfl: 4 ??  azithromycin (ZITHROMAX) 250 MG tablet, Take 2 tablets on day 1, then 1 tablet Webb on days 2 through 5, Disp: 6 tablet, Rfl: 0 ??  fexofenadine (ALLEGRA ALLERGY) 180 MG tablet, Take 1 tablet (180 mg total) by mouth Webb., Disp: 10 tablet, Rfl: 1 ??  FIBER PO, Take 1 each by mouth Webb., Disp: , Rfl:  ??  fluticasone (FLONASE) 50 MCG/ACT nasal spray, Place 2 sprays into both nostrils Webb., Disp: , Rfl:  ??  furosemide (LASIX) 20 MG tablet, TAKE 1 TABLET BY MOUTH TWICE A DAY AS NEEDED FOR EDEMA, Disp: 180 tablet, Rfl: 2 ??  levocetirizine (XYZAL) 5 MG tablet, TAKE 1 TABLET BY MOUTH EVERY DAY IN THE EVENING, Disp: 90 tablet, Rfl: 4 ??  losartan (COZAAR) 100 MG tablet, Take 1 tablet (100 mg total) by mouth Webb., Disp: 90 tablet, Rfl: 4 ??  methylPREDNISolone (MEDROL DOSEPAK) 4 MG TBPK tablet, Use as directed, Disp: 1 each, Rfl: 0 ??  metoprolol tartrate (LOPRESSOR) 25 MG tablet, TAKE 1 TABLET BY MOUTH TWICE A DAY, Disp: 180 tablet, Rfl: 2 ??  Multiple Vitamins-Minerals (MULTIVITAMIN GUMMIES ADULT PO), Take by mouth Webb., Disp: , Rfl:   ? ? ?Assessment & Plan:  ?URI: Flu and strep ordered at this visit, both negative. Wil start pt on zpak / allegra for such as well as medrol dose pak  ?pt advised to take Tylenol q 4- 6 hourly as needed. pt to take allegra q pm  as needed and to call office if symptoms  worsened pt verbalised understanding of such. ? ?  ? ? ?Problem List Items Addressed This Visit   ? ?  ? Respiratory  ? Acute sinusitis - Primary  ? Relevant Medications  ? fexofenadine (ALLEGRA ALLERGY) 180 MG tablet  ? azithromycin (ZITHROMAX) 250 MG tablet  ? methylPREDNISolone (MEDROL DOSEPAK) 4 MG TBPK tablet  ?  ? ?No orders of the defined types were placed in this encounter. ?  ? ?Meds ordered this encounter  ?Medications  ?? fexofenadine (ALLEGRA ALLERGY) 180 MG tablet  ?  Sig: Take 1 tablet (180 mg total) by mouth Webb.  ?  Dispense:  10 tablet  ?  Refill:  1  ?? azithromycin (ZITHROMAX) 250 MG tablet  ?  Sig: Take 2 tablets on day 1, then 1 tablet Webb on days 2 through 5  ?  Dispense:  6 tablet  ?  Refill:  0  ?? methylPREDNISolone (MEDROL DOSEPAK) 4 MG TBPK tablet  ?  Sig: Use as directed  ?  Dispense:  1 each  ?  Refill:  0  ?  ? ?Follow up plan: ?No follow-ups on file. ? ?

## 2021-08-07 DIAGNOSIS — D6869 Other thrombophilia: Secondary | ICD-10-CM | POA: Insufficient documentation

## 2021-08-07 NOTE — Patient Instructions (Incomplete)
Please call to schedule your mammogram and/or bone density: [facility] at [facility]  Address: [address], [place], [place] [address] Phone: [phone]   DASH Eating Plan DASH stands for Dietary Approaches to Stop Hypertension. The DASH eating plan is a healthy eating plan that has been shown to: Reduce high blood pressure (hypertension). Reduce your risk for type 2 diabetes, heart disease, and stroke. Help with weight loss. What are tips for following this plan? Reading food labels Check food labels for the amount of salt (sodium) per serving. Choose foods with less than 5 percent of the Daily Value of sodium. Generally, foods with less than 300 milligrams (mg) of sodium per serving fit into this eating plan. To find whole grains, look for the word "whole" as the first word in the ingredient list. Shopping Buy products labeled as "low-sodium" or "no salt added." Buy fresh foods. Avoid canned foods and pre-made or frozen meals. Cooking Avoid adding salt when cooking. Use salt-free seasonings or herbs instead of table salt or sea salt. Check with your health care provider or pharmacist before using salt substitutes. Do not fry foods. Cook foods using healthy methods such as baking, boiling, grilling, roasting, and broiling instead. Cook with heart-healthy oils, such as olive, canola, avocado, soybean, or sunflower oil. Meal planning  Eat a balanced diet that includes: 4 or more servings of fruits and 4 or more servings of vegetables each day. Try to fill one-half of your plate with fruits and vegetables. 6-8 servings of whole grains each day. Less than 6 oz (170 g) of lean meat, poultry, or fish each day. A 3-oz (85-g) serving of meat is about the same size as a deck of cards. One egg equals 1 oz (28 g). 2-3 servings of low-fat dairy each day. One serving is 1 cup (237 mL). 1 serving of nuts, seeds, or beans 5 times each week. 2-3 servings of  heart-healthy fats. Healthy fats called omega-3 fatty acids are found in foods such as walnuts, flaxseeds, fortified milks, and eggs. These fats are also found in cold-water fish, such as sardines, salmon, and mackerel. Limit how much you eat of: Canned or prepackaged foods. Food that is high in trans fat, such as some fried foods. Food that is high in saturated fat, such as fatty meat. Desserts and other sweets, sugary drinks, and other foods with added sugar. Full-fat dairy products. Do not salt foods before eating. Do not eat more than 4 egg yolks a week. Try to eat at least 2 vegetarian meals a week. Eat more home-cooked food and less restaurant, buffet, and fast food. Lifestyle When eating at a restaurant, ask that your food be prepared with less salt or no salt, if possible. If you drink alcohol: Limit how much you use to: 0-1 drink a day for women who are not pregnant. 0-2 drinks a day for men. Be aware of how much alcohol is in your drink. In the [place] one drink equals one 12 oz bottle of beer (355 mL), one 5 oz glass of wine (148 mL), or one 1 oz glass of hard liquor (44 mL). General information Avoid eating more than 2,300 mg of salt a day. If you have hypertension, you may need to reduce your sodium intake to 1,500 mg a day. Work with your health care provider to maintain a healthy body weight or to lose weight. Ask what an ideal weight is for you. Get at least 30 minutes of   exercise that causes your heart to beat faster (aerobic exercise) most days of the week. Activities may include walking, swimming, or biking. Work with your health care provider or dietitian to adjust your eating plan to your individual calorie needs. What foods should I eat? Fruits All fresh, dried, or frozen fruit. Canned fruit in natural juice (without added sugar). Vegetables Fresh or frozen vegetables (raw, steamed, roasted, or grilled). Low-sodium or reduced-sodium tomato and vegetable juice.  Low-sodium or reduced-sodium tomato sauce and tomato paste. Low-sodium or reduced-sodium canned vegetables. Grains Whole-grain or whole-wheat bread. Whole-grain or whole-wheat pasta. Brown rice. Oatmeal. Quinoa. Bulgur. Whole-grain and low-sodium cereals. Pita bread. Low-fat, low-sodium crackers. Whole-wheat flour tortillas. Meats and other proteins Skinless chicken or turkey. Ground chicken or turkey. Pork with fat trimmed off. Fish and seafood. Egg whites. Dried beans, peas, or lentils. Unsalted nuts, nut butters, and seeds. Unsalted canned beans. Lean cuts of beef with fat trimmed off. Low-sodium, lean precooked or cured meat, such as sausages or meat loaves. Dairy Low-fat (1%) or fat-free (skim) milk. Reduced-fat, low-fat, or fat-free cheeses. Nonfat, low-sodium ricotta or cottage cheese. Low-fat or nonfat yogurt. Low-fat, low-sodium cheese. Fats and oils Soft margarine without trans fats. Vegetable oil. Reduced-fat, low-fat, or light mayonnaise and salad dressings (reduced-sodium). Canola, safflower, olive, avocado, soybean, and sunflower oils. Avocado. Seasonings and condiments Herbs. Spices. Seasoning mixes without salt. Other foods Unsalted popcorn and pretzels. Fat-free sweets. The items listed above may not be a complete list of foods and beverages you can eat. Contact a dietitian for more information. What foods should I avoid? Fruits Canned fruit in a light or heavy syrup. Fried fruit. Fruit in cream or butter sauce. Vegetables Creamed or fried vegetables. Vegetables in a cheese sauce. Regular canned vegetables (not low-sodium or reduced-sodium). Regular canned tomato sauce and paste (not low-sodium or reduced-sodium). Regular tomato and vegetable juice (not low-sodium or reduced-sodium). Pickles. Olives. Grains Baked goods made with fat, such as croissants, muffins, or some breads. Dry pasta or rice meal packs. Meats and other proteins Fatty cuts of meat. Ribs. Fried meat. Bacon.  Bologna, salami, and other precooked or cured meats, such as sausages or meat loaves. Fat from the back of a pig (fatback). Bratwurst. Salted nuts and seeds. Canned beans with added salt. Canned or smoked fish. Whole eggs or egg yolks. Chicken or turkey with skin. Dairy Whole or 2% milk, cream, and half-and-half. Whole or full-fat cream cheese. Whole-fat or sweetened yogurt. Full-fat cheese. Nondairy creamers. Whipped toppings. Processed cheese and cheese spreads. Fats and oils Butter. Stick margarine. Lard. Shortening. Ghee. Bacon fat. Tropical oils, such as coconut, palm kernel, or palm oil. Seasonings and condiments Onion salt, garlic salt, seasoned salt, table salt, and sea salt. Worcestershire sauce. Tartar sauce. Barbecue sauce. Teriyaki sauce. Soy sauce, including reduced-sodium. Steak sauce. Canned and packaged gravies. Fish sauce. Oyster sauce. Cocktail sauce. Store-bought horseradish. Ketchup. Mustard. Meat flavorings and tenderizers. Bouillon cubes. Hot sauces. Pre-made or packaged marinades. Pre-made or packaged taco seasonings. Relishes. Regular salad dressings. Other foods Salted popcorn and pretzels. The items listed above may not be a complete list of foods and beverages you should avoid. Contact a dietitian for more information. Where to find more information National Heart, Lung, and Blood Institute: www.nhlbi.nih.gov American Heart Association: www.heart.org Academy of Nutrition and Dietetics: www.eatright.org National Kidney Foundation: www.kidney.org Summary The DASH eating plan is a healthy eating plan that has been shown to reduce high blood pressure (hypertension). It may also reduce your risk for type 2   diabetes, heart disease, and stroke. When on the DASH eating plan, aim to eat more fresh fruits and vegetables, whole grains, lean proteins, low-fat dairy, and heart-healthy fats. With the DASH eating plan, you should limit salt (sodium) intake to 2,300 mg a day. If you have  hypertension, you may need to reduce your sodium intake to 1,500 mg a day. Work with your health care provider or dietitian to adjust your eating plan to your individual calorie needs. This information is not intended to replace advice given to you by your health care provider. Make sure you discuss any questions you have with your health care provider. Document Revised: 12/21/2018 Document Reviewed: 12/21/2018 [employer] Patient Education  2023 [employer]  

## 2021-08-09 ENCOUNTER — Ambulatory Visit (INDEPENDENT_AMBULATORY_CARE_PROVIDER_SITE_OTHER): Payer: Medicare Other | Admitting: Nurse Practitioner

## 2021-08-09 ENCOUNTER — Encounter: Payer: Self-pay | Admitting: Nurse Practitioner

## 2021-08-09 VITALS — BP 108/66 | HR 99 | Temp 97.9°F | Ht 64.6 in | Wt 293.6 lb

## 2021-08-09 DIAGNOSIS — I471 Supraventricular tachycardia: Secondary | ICD-10-CM

## 2021-08-09 DIAGNOSIS — I071 Rheumatic tricuspid insufficiency: Secondary | ICD-10-CM | POA: Diagnosis not present

## 2021-08-09 DIAGNOSIS — Z23 Encounter for immunization: Secondary | ICD-10-CM | POA: Diagnosis not present

## 2021-08-09 DIAGNOSIS — Z6841 Body Mass Index (BMI) 40.0 and over, adult: Secondary | ICD-10-CM | POA: Diagnosis not present

## 2021-08-09 DIAGNOSIS — Z86711 Personal history of pulmonary embolism: Secondary | ICD-10-CM

## 2021-08-09 DIAGNOSIS — D6869 Other thrombophilia: Secondary | ICD-10-CM

## 2021-08-09 DIAGNOSIS — Z9049 Acquired absence of other specified parts of digestive tract: Secondary | ICD-10-CM | POA: Insufficient documentation

## 2021-08-09 DIAGNOSIS — I340 Nonrheumatic mitral (valve) insufficiency: Secondary | ICD-10-CM

## 2021-08-09 DIAGNOSIS — Z0000 Encounter for general adult medical examination without abnormal findings: Secondary | ICD-10-CM

## 2021-08-09 DIAGNOSIS — I10 Essential (primary) hypertension: Secondary | ICD-10-CM

## 2021-08-09 DIAGNOSIS — K8020 Calculus of gallbladder without cholecystitis without obstruction: Secondary | ICD-10-CM

## 2021-08-09 DIAGNOSIS — R7309 Other abnormal glucose: Secondary | ICD-10-CM | POA: Diagnosis not present

## 2021-08-09 DIAGNOSIS — R1011 Right upper quadrant pain: Secondary | ICD-10-CM | POA: Diagnosis not present

## 2021-08-09 DIAGNOSIS — E782 Mixed hyperlipidemia: Secondary | ICD-10-CM | POA: Diagnosis not present

## 2021-08-09 DIAGNOSIS — I890 Lymphedema, not elsewhere classified: Secondary | ICD-10-CM

## 2021-08-09 LAB — BAYER DCA HB A1C WAIVED: HB A1C (BAYER DCA - WAIVED): 5.8 % — ABNORMAL HIGH (ref 4.8–5.6)

## 2021-08-09 MED ORDER — FUROSEMIDE 20 MG PO TABS: ORAL_TABLET | ORAL | 4 refills | Status: DC

## 2021-08-09 MED ORDER — SHINGRIX 50 MCG/0.5ML IM SUSR: 0.5000 mL | Freq: Once | INTRAMUSCULAR | 0 refills | Status: AC

## 2021-08-09 NOTE — Progress Notes (Signed)
BP 108/66   Pulse 99   Temp 97.9 F (36.6 C) (Oral)   Ht 5' 4.6" (1.641 m)   Wt 293 lb 9.6 oz (133.2 kg)   LMP  (LMP Unknown)   SpO2 94%   BMI 49.46 kg/m    Subjective:    Patient ID: Jasmine Webb, female    DOB: 1955-04-07, 66 y.o.   MRN: [id number]  HPI: Jasmine Webb is a 66 y.o. female presenting on 08/09/2021 for comprehensive medical examination. Current medical complaints include:none  She currently lives with: self Menopausal Symptoms: no   HYPERTENSION / HYPERLIPIDEMIA Continues on Losartan 100 MG, Metoprolol 25 MG BID, and Atorvastatin 40 MG daily.  Last saw vascular, Dr. Lucky Cowboy, on 03/25/21 -- no changes needed.  Continues on Eliquis for history of PE with Covid. Last saw cardiology 12/17/20 -- for SVT and history of PE.     She does have compression hose and is wearing them daily.  Has Lasix to take as needed -- she reports taking this daily. Satisfied with current treatment? yes Duration of hypertension: chronic BP monitoring frequency: not checking BP range: 100-110/60-70 range BP medication side effects: no Duration of hyperlipidemia: chronic Cholesterol medication side effects: no Cholesterol supplements: none Medication compliance: good compliance Aspirin: no Recent stressors: no Recurrent headaches: no Visual changes: no Palpitations: no Dyspnea: at baseline Chest pain: no Lower extremity edema: at baseline, no worsening Dizzy/lightheaded: none  ABDOMINAL PAIN  She reports having gall bladder issues, saw Dr. Wynetta Emery for this on 04/01/21.  She quit taking fiber pill and multivitamins, but this is ongoing.  Has ongoing pain.  Having loose, diarrhea like, BM every day. Still has gall bladder.  She also endorses hemorrhoids, using Preparation-H every day.  Irritated by frequent diarrhea. Duration:months Onset: gradual Severity: 7/10 Quality: sharp, dull, aching, and throbbing Location:  RUQ  Episode duration: all day at times Radiation: no Frequency:  intermittent Alleviating factors: Tylenol Aggravating factors: eating specific foods -- pork or red meat large amounts Status: fluctuating Treatments attempted: Tylenol Fever: no Nausea: no Vomiting: no Weight loss: no Decreased appetite: yes Diarrhea: yes Constipation: no Blood in stool: no Heartburn: no Jaundice: no Rash: no Dysuria/urinary frequency: no Hematuria: no History of sexually transmitted disease: no Recurrent NSAID use: no   Depression Screen done today and results listed below:     04/01/2021    9:13 AM 02/08/2021    3:52 PM 01/19/2021    9:13 AM 06/23/2020   10:29 AM 01/13/2020    1:02 PM  Depression screen PHQ 2/9  Decreased Interest 0 0 0 0 0  Down, Depressed, Hopeless 0 0 0 0 0  PHQ - 2 Score 0 0 0 0 0  Altered sleeping 0 0  0   Tired, decreased energy 0 0  0   Change in appetite 0 0  0   Feeling bad or failure about yourself  0 0  0   Trouble concentrating 0 0  0   Moving slowly or fidgety/restless 0 0  0   Suicidal thoughts 0 0  0   PHQ-9 Score 0 0  0   Difficult doing work/chores Not difficult at all Not difficult at all  Not difficult at all        06/07/2020   10:01 AM 11/13/2020    8:45 PM 11/15/2020    7:52 AM 01/19/2021    9:03 AM 04/01/2021    9:13 AM  Fall Risk  Falls in the past year?  0 0  Was there an injury with Fall?    0 0  Fall Risk Category Calculator    0 0  Fall Risk Category    Low Low  Patient Fall Risk Level Low fall risk Low fall risk Low fall risk Low fall risk Low fall risk  Patient at Risk for Falls Due to     No Fall Risks  Fall risk Follow up    Falls evaluation completed;Falls prevention discussed Falls evaluation completed    Functional Status Survey: Is the patient deaf or have difficulty hearing?: No Does the patient have difficulty seeing, even when wearing glasses/contacts?: No Does the patient have difficulty concentrating, remembering, or making decisions?: No Does the patient have difficulty walking or  climbing stairs?: No Does the patient have difficulty dressing or bathing?: No Does the patient have difficulty doing errands alone such as visiting a doctor's office or shopping?: No   Past Medical History:  Past Medical History:  Diagnosis Date   Arthritis    Hyperlipidemia    Hypertension    Tumor    breast and pelvic   Vertigo     Surgical History:  Past Surgical History:  Procedure Laterality Date   ABDOMINAL HYSTERECTOMY  2003   BREAST SURGERY     PULMONARY THROMBECTOMY N/A 10/10/2019   Procedure: PULMONARY THROMBECTOMY;  Surgeon: Algernon Huxley, MD;  Location: [facility];  Service: Cardiovascular;  Laterality: N/A;    Medications:  Current Outpatient Medications on File Prior to Visit  Medication Sig   acetaminophen (TYLENOL) 500 MG tablet Take 500 mg by mouth every 6 (six) hours as needed.   apixaban (ELIQUIS) 5 MG TABS tablet Take 1 tablet (5 mg total) by mouth 2 (two) times daily.   atorvastatin (LIPITOR) 40 MG tablet Take 1 tablet (40 mg total) by mouth daily.   fexofenadine (ALLEGRA ALLERGY) 180 MG tablet Take 1 tablet (180 mg total) by mouth daily.   fluticasone (FLONASE) 50 MCG/ACT nasal spray Place 2 sprays into both nostrils daily.   levocetirizine (XYZAL) 5 MG tablet TAKE 1 TABLET BY MOUTH EVERY DAY IN THE EVENING   losartan (COZAAR) 100 MG tablet Take 1 tablet (100 mg total) by mouth daily.   metoprolol tartrate (LOPRESSOR) 25 MG tablet TAKE 1 TABLET BY MOUTH TWICE A DAY   No current facility-administered medications on file prior to visit.    Allergies:  Allergies  Allergen Reactions   Fish Allergy    Ibuprofen     Told to avoid ibuprofen related products by doctor.    Social History:  Social History   Socioeconomic History   Marital status: Divorced    Spouse name: Not on file   Number of children: Not on file   Years of education: Not on file   Highest education level: Not on file  Occupational History   Not on file  Tobacco Use    Smoking status: Never   Smokeless tobacco: Never  Vaping Use   Vaping Use: Never used  Substance and Sexual Activity   Alcohol use: Never   Drug use: Never   Sexual activity: Not Currently  Other Topics Concern   Not on file  Social History Narrative   Not on file   Social Determinants of Health   Financial Resource Strain: Low Risk  (01/19/2021)   Overall Financial Resource Strain (CARDIA)    Difficulty of Paying Living Expenses: Not hard at all  Food Insecurity: No Food Insecurity (01/19/2021)  Hunger Vital Sign    Worried About Running Out of Food in the Last Year: Never true    Ran Out of Food in the Last Year: Never true  Transportation Needs: No Transportation Needs (01/19/2021)   PRAPARE - Hydrologist (Medical): No    Lack of Transportation (Non-Medical): No  Physical Activity: Insufficiently Active (01/19/2021)   Exercise Vital Sign    Days of Exercise per Week: 3 days    Minutes of Exercise per Session: 40 min  Stress: No Stress Concern Present (01/19/2021)   [place]    Feeling of Stress : Not at all  Social Connections: Moderately Isolated (01/19/2021)   Social Connection and Isolation Panel [NHANES]    Frequency of Communication with Friends and Family: More than three times a week    Frequency of Social Gatherings with Friends and Family: More than three times a week    Attends Religious Services: More than 4 times per year    Active Member of [employer] or Organizations: No    Attends Archivist Meetings: Never    Marital Status: Divorced  Human resources officer Violence: Not At Risk (01/19/2021)   Humiliation, Afraid, Rape, and Kick questionnaire    Fear of Current or Ex-Partner: No    Emotionally Abused: No    Physically Abused: No    Sexually Abused: No   Social History   Tobacco Use  Smoking Status Never  Smokeless Tobacco Never   Social History    Substance and Sexual Activity  Alcohol Use Never    Family History:  Family History  Problem Relation Age of Onset   COPD Mother    ALS Father    Heart disease Sister    Breast cancer Sister    Colon cancer Sister    Diabetes Brother    Hypertension Brother    Asthma Son    Sinusitis Son    Congestive Heart Failure Maternal Uncle     Past medical history, surgical history, medications, allergies, family history and social history reviewed with patient today and changes made to appropriate areas of the chart.   Review of Systems - negative All other ROS negative except what is listed above and in the HPI.      Objective:    BP 108/66   Pulse 99   Temp 97.9 F (36.6 C) (Oral)   Ht 5' 4.6" (1.641 m)   Wt 293 lb 9.6 oz (133.2 kg)   LMP  (LMP Unknown)   SpO2 94%   BMI 49.46 kg/m   Wt Readings from Last 3 Encounters:  08/09/21 293 lb 9.6 oz (133.2 kg)  04/05/21 293 lb 12.8 oz (133.3 kg)  04/01/21 293 lb 12.8 oz (133.3 kg)    Physical Exam Vitals and nursing note reviewed. Exam conducted with a chaperone present.  Constitutional:      General: She is awake. She is not in acute distress.    Appearance: She is well-developed. She is morbidly obese. She is not ill-appearing.  HENT:     Head: Normocephalic and atraumatic.     Right Ear: Hearing, tympanic membrane, ear canal and external ear normal. No drainage.     Left Ear: Hearing, tympanic membrane, ear canal and external ear normal. No drainage.     Nose: Nose normal.     Right Sinus: No maxillary sinus tenderness or frontal sinus tenderness.     Left Sinus: No  maxillary sinus tenderness or frontal sinus tenderness.     Mouth/Throat:     Mouth: Mucous membranes are moist.     Pharynx: Oropharynx is clear. Uvula midline. No pharyngeal swelling, oropharyngeal exudate or posterior oropharyngeal erythema.  Eyes:     General: Lids are normal.        Right eye: No discharge.        Left eye: No discharge.      Extraocular Movements: Extraocular movements intact.     Conjunctiva/sclera: Conjunctivae normal.     Pupils: Pupils are equal, round, and reactive to light.     Visual Fields: Right eye visual fields normal and left eye visual fields normal.  Neck:     Thyroid: No thyromegaly.     Vascular: No carotid bruit.     Trachea: Trachea normal.  Cardiovascular:     Rate and Rhythm: Normal rate. Rhythm irregular.     Heart sounds: Murmur heard.     Systolic murmur is present with a grade of 2/6.     No gallop.  Pulmonary:     Effort: Pulmonary effort is normal. No accessory muscle usage or respiratory distress.     Breath sounds: Normal breath sounds.  Chest:  Breasts:    Right: Normal.     Left: Normal.  Abdominal:     General: Bowel sounds are normal. There is no distension.     Palpations: Abdomen is soft. There is no hepatomegaly.     Tenderness: There is abdominal tenderness in the right upper quadrant. There is no right CVA tenderness, left CVA tenderness, guarding or rebound. Positive signs include Murphy's sign.     Hernia: No hernia is present.  Musculoskeletal:        General: Normal range of motion.     Cervical back: Normal range of motion and neck supple.     Right lower leg: 2+ Edema present.     Left lower leg: 2+ Edema present.  Lymphadenopathy:     Head:     Right side of head: No submental, submandibular, tonsillar, preauricular or posterior auricular adenopathy.     Left side of head: No submental, submandibular, tonsillar, preauricular or posterior auricular adenopathy.     Cervical: No cervical adenopathy.     Upper Body:     Right upper body: No supraclavicular, axillary or pectoral adenopathy.     Left upper body: No supraclavicular, axillary or pectoral adenopathy.  Skin:    General: Skin is warm and dry.     Capillary Refill: Capillary refill takes less than 2 seconds.     Findings: No rash.  Neurological:     Mental Status: She is alert and oriented to  person, place, and time.     Gait: Gait is intact.     Deep Tendon Reflexes: Reflexes are normal and symmetric.     Reflex Scores:      Brachioradialis reflexes are 2+ on the right side and 2+ on the left side.      Patellar reflexes are 2+ on the right side and 2+ on the left side. Psychiatric:        Attention and Perception: Attention normal.        Mood and Affect: Mood normal.        Speech: Speech normal.        Behavior: Behavior normal. Behavior is cooperative.        Thought Content: Thought content normal.  Judgment: Judgment normal.    Results for orders placed or performed in visit on 08/09/21  Bayer DCA Hb A1c Waived  Result Value Ref Range   HB A1C (BAYER DCA - WAIVED) 5.8 (H) 4.8 - 5.6 %      Assessment & Plan:   Problem List Items Addressed This Visit       Cardiovascular and Mediastinum   Essential hypertension    Ongoing with BP at goal today in office.  Recommend she monitor BP at home regularly and document.  Will continue Losartan 100 MG daily, Metoprolol, and Lasix 20 MG for edema.  Would avoid Amlodipine due to baseline edema to BLE with lymphedema. Focus on DASH diet at home.  Return in 6 months.  Recommend: - Reminded to call for an overnight weight gain of >2 pounds or a weekly weight weight of >5 pounds - not adding salt to his food and has been reading food labels. Reviewed the importance of keeping daily sodium intake to '2000mg'$  daily       Relevant Medications   furosemide (LASIX) 20 MG tablet   Other Relevant Orders   CBC with Differential/Platelet   Comprehensive metabolic panel   TSH   Mild mitral regurgitation by prior echocardiogram    Followed by cardiology, continue this collaboration and current medication regimen.      Relevant Medications   furosemide (LASIX) 20 MG tablet   Other Relevant Orders   Comprehensive metabolic panel   Lipid Panel w/o Chol/HDL Ratio   Mild tricuspid regurgitation by prior echocardiogram    Mild  to Moderate on echo July 2022.  Followed by cardiology, continue this collaboration and current medication regimen.      Relevant Medications   furosemide (LASIX) 20 MG tablet   SVT (supraventricular tachycardia) (HCC) - Primary    Ongoing and followed by cardiology, will continue Metoprolol as ordered by them.  Recent notes and testing reviewed.  She has no symptoms at this time.      Relevant Medications   furosemide (LASIX) 20 MG tablet   Other Relevant Orders   Comprehensive metabolic panel   Lipid Panel w/o Chol/HDL Ratio     Other   BMI 50.0-59.9, adult (HCC)    Recommended eating smaller high protein, low fat meals more frequently and exercising 30 mins a day 5 times a week with a goal of 10-15lb weight loss in the next 3 months. Patient voiced their understanding and motivation to adhere to these recommendations.       Elevated hemoglobin A1c measurement    A1c today remains stable at 5.8%, downward trend.  Continue heavy focus on prediabetic meal changes.      Relevant Orders   Bayer DCA Hb A1c Waived (Completed)   History of pulmonary embolus (PE)    With Covid in September 2021.  At this time will continue Eliquis and collaboration with vascular + cardiology.  Refills up to date.  Cardiology recommends lifelong treatment.      Relevant Orders   CBC with Differential/Platelet   Hyperlipidemia    Chronic, ongoing.  Continue current medication regimen and adjust as needed, Atorvastatin 40 MG.  Lipid panel today.      Relevant Medications   furosemide (LASIX) 20 MG tablet   Other Relevant Orders   Comprehensive metabolic panel   Lipid Panel w/o Chol/HDL Ratio   Lymphedema    Chronic, stable.   Continue to collaborate with vascular as needed and use compression pumps at home.  Relevant Orders   Comprehensive metabolic panel   Morbid obesity due to excess calories (HCC)    BMI 49.46, has lost some since last visit.  Recommended eating smaller high protein,  low fat meals more frequently and exercising 30 mins a day 5 times a week with a goal of 10-15lb weight loss in the next 3 months. Patient voiced their understanding and motivation to adhere to these recommendations.       RUQ pain    Ongoing issue for over one month.  Will obtain imaging and recheck labs today.  Check Alpha Gal due to trigger of pork and beef being present.  Consider general surgery referral if stones present.      Relevant Orders   Alpha-Gal Panel   Gamma GT   US Abdomen Limited RUQ (LIVER/GB)   Secondary hypercoagulable state ([employer])    Taking Eliquis due to past PE.  Continue to monitor closely and check CBC annually.      Relevant Orders   CBC with Differential/Platelet   Other Visit Diagnoses     Need for shingles vaccine       Shingrix vaccines ordered today.   Relevant Medications   Zoster Vaccine Adjuvanted [facility]) injection   Zoster Vaccine Adjuvanted [facility]) injection (Start on 01/20/2022)   Encounter for annual physical exam       Annual physical with labs today and health maintenance reviewed with patient.        Follow up plan: Return in about 2 weeks (around 08/23/2021) for RUQ PAIN.   LABORATORY TESTING:  - Pap smear: not applicable  IMMUNIZATIONS:   - Tdap: Tetanus vaccination status reviewed: Up To Date - Influenza: refused - Pneumovax: Due in 2024 - Prevnar: Up To Date - HPV: Not applicable - Zostavax vaccine: Ordered today to pharmacy  SCREENING: -Mammogram: Refuses - Colonoscopy: Cologuard up to date - Bone Density: Ordered today  -Hearing Test: Not applicable  -Spirometry: Not applicable   PATIENT COUNSELING:   Advised to take 1 mg of folate supplement per day if capable of pregnancy.   Sexuality: Discussed sexually transmitted diseases, partner selection, use of condoms, avoidance of unintended pregnancy  and contraceptive alternatives.   Advised to avoid cigarette smoking.  I discussed with the patient that most  people either abstain from alcohol or drink within safe limits (<=14/week and <=4 drinks/occasion for males, <=7/weeks and <= 3 drinks/occasion for females) and that the risk for alcohol disorders and other health effects rises proportionally with the number of drinks per week and how often a drinker exceeds daily limits.  Discussed cessation/primary prevention of drug use and availability of treatment for abuse.   Diet: Encouraged to adjust caloric intake to maintain  or achieve ideal body weight, to reduce intake of dietary saturated fat and total fat, to limit sodium intake by avoiding high sodium foods and not adding table salt, and to maintain adequate dietary potassium and calcium preferably from fresh fruits, vegetables, and low-fat dairy products.    Stressed the importance of regular exercise  Injury prevention: Discussed safety belts, safety helmets, smoke detector, smoking near bedding or upholstery.   Dental health: Discussed importance of regular tooth brushing, flossing, and dental visits.    NEXT PREVENTATIVE PHYSICAL DUE IN 1 YEAR. Return in about 2 weeks (around 08/23/2021) for RUQ PAIN.

## 2021-08-09 NOTE — Assessment & Plan Note (Signed)
Chronic, ongoing.  Continue current medication regimen and adjust as needed, Atorvastatin 40 MG.  Lipid panel today. 

## 2021-08-09 NOTE — Assessment & Plan Note (Signed)
Chronic, stable.   Continue to collaborate with vascular as needed and use compression pumps at home. 

## 2021-08-09 NOTE — Assessment & Plan Note (Signed)
Recommended eating smaller high protein, low fat meals more frequently and exercising 30 mins a day 5 times a week with a goal of 10-15lb weight loss in the next 3 months. Patient voiced their understanding and motivation to adhere to these recommendations.  

## 2021-08-09 NOTE — Assessment & Plan Note (Signed)
Ongoing with BP at goal today in office.  Recommend she monitor BP at home regularly and document.  Will continue Losartan 100 MG daily, Metoprolol, and Lasix 20 MG for edema.  Would avoid Amlodipine due to baseline edema to BLE with lymphedema. Focus on DASH diet at home.  Return in 6 months.  Recommend: - Reminded to call for an overnight weight gain of >2 pounds or a weekly weight weight of >5 pounds - not adding salt to his food and has been reading food labels. Reviewed the importance of keeping daily sodium intake to '2000mg'$  daily

## 2021-08-09 NOTE — Assessment & Plan Note (Signed)
With Covid in September 2021.  At this time will continue Eliquis and collaboration with vascular + cardiology.  Refills up to date.  Cardiology recommends lifelong treatment.

## 2021-08-09 NOTE — Assessment & Plan Note (Signed)
Mild to Moderate on echo July 2022.  Followed by cardiology, continue this collaboration and current medication regimen. 

## 2021-08-09 NOTE — Assessment & Plan Note (Signed)
Ongoing issue for over one month.  Will obtain imaging and recheck labs today.  Check Alpha Gal due to trigger of pork and beef being present.  Consider general surgery referral if stones present.

## 2021-08-09 NOTE — Assessment & Plan Note (Signed)
BMI 49.46, has lost some since last visit.  Recommended eating smaller high protein, low fat meals more frequently and exercising 30 mins a day 5 times a week with a goal of 10-15lb weight loss in the next 3 months. Patient voiced their understanding and motivation to adhere to these recommendations.

## 2021-08-09 NOTE — Assessment & Plan Note (Signed)
Ongoing and followed by cardiology, will continue Metoprolol as ordered by them.  Recent notes and testing reviewed.  She has no symptoms at this time.

## 2021-08-09 NOTE — Assessment & Plan Note (Addendum)
A1c today remains stable at 5.8%, downward trend.  Continue heavy focus on prediabetic meal changes.

## 2021-08-09 NOTE — Assessment & Plan Note (Signed)
Followed by cardiology, continue this collaboration and current medication regimen. 

## 2021-08-09 NOTE — Assessment & Plan Note (Signed)
Taking Eliquis due to past PE.  Continue to monitor closely and check CBC annually. 

## 2021-08-10 ENCOUNTER — Other Ambulatory Visit: Payer: Self-pay | Admitting: Nurse Practitioner

## 2021-08-10 DIAGNOSIS — E876 Hypokalemia: Secondary | ICD-10-CM

## 2021-08-10 LAB — COMPREHENSIVE METABOLIC PANEL
ALT: 12 IU/L (ref 0–32)
AST: 15 IU/L (ref 0–40)
Albumin/Globulin Ratio: 1.7 (ref 1.2–2.2)
Albumin: 3.8 g/dL — ABNORMAL LOW (ref 3.9–4.9)
Alkaline Phosphatase: 78 IU/L (ref 44–121)
BUN/Creatinine Ratio: 14 (ref 12–28)
BUN: 10 mg/dL (ref 8–27)
Bilirubin Total: 1.6 mg/dL — ABNORMAL HIGH (ref 0.0–1.2)
CO2: 25 mmol/L (ref 20–29)
Calcium: 9.3 mg/dL (ref 8.7–10.3)
Chloride: 105 mmol/L (ref 96–106)
Creatinine, Ser: 0.72 mg/dL (ref 0.57–1.00)
Globulin, Total: 2.3 g/dL (ref 1.5–4.5)
Glucose: 100 mg/dL — ABNORMAL HIGH (ref 70–99)
Potassium: 3.3 mmol/L — ABNORMAL LOW (ref 3.5–5.2)
Sodium: 145 mmol/L — ABNORMAL HIGH (ref 134–144)
Total Protein: 6.1 g/dL (ref 6.0–8.5)
eGFR: 92 mL/min/{1.73_m2} (ref >59)

## 2021-08-10 LAB — CBC WITH DIFFERENTIAL/PLATELET
Basophils Absolute: 0 10*3/uL (ref 0.0–0.2)
Basos: 0 %
EOS (ABSOLUTE): 0.2 10*3/uL (ref 0.0–0.4)
Eos: 3 %
Hematocrit: 38.6 % (ref 34.0–46.6)
Hemoglobin: 12.8 g/dL (ref 11.1–15.9)
Immature Grans (Abs): 0 10*3/uL (ref 0.0–0.1)
Immature Granulocytes: 0 %
Lymphocytes Absolute: 1.3 10*3/uL (ref 0.7–3.1)
Lymphs: 23 %
MCH: 28.3 pg (ref 26.6–33.0)
MCHC: 33.2 g/dL (ref 31.5–35.7)
MCV: 85 fL (ref 79–97)
Monocytes Absolute: 0.6 10*3/uL (ref 0.1–0.9)
Monocytes: 10 %
Neutrophils Absolute: 3.8 10*3/uL (ref 1.4–7.0)
Neutrophils: 64 %
Platelets: 177 10*3/uL (ref 150–450)
RBC: 4.53 x10E6/uL (ref 3.77–5.28)
RDW: 12.9 % (ref 11.7–15.4)
WBC: 5.9 10*3/uL (ref 3.4–10.8)

## 2021-08-10 LAB — TSH: TSH: 3.76 u[IU]/mL (ref 0.450–4.500)

## 2021-08-10 LAB — LIPID PANEL W/O CHOL/HDL RATIO
Cholesterol, Total: 112 mg/dL (ref 100–199)
HDL: 48 mg/dL (ref >39)
LDL Chol Calc (NIH): 45 mg/dL (ref 0–99)
Triglycerides: 102 mg/dL (ref 0–149)
VLDL Cholesterol Cal: 19 mg/dL (ref 5–40)

## 2021-08-10 NOTE — Progress Notes (Signed)
Good morning, please let Jasmine Webb know her labs have returned: - Kidney function, creatinine and eGFR, remains normal, as is liver function, AST and ALT.  Sodium, salt, level a little elevated.  Recommend she decrease salt intake a little.  Potassium a little low, this can we related to food intake or Lasix.  I recommend increase potassium rich foods like bananas, potatoes, dried fruit.  Would like her to return in 2 weeks for lab only visit to recheck. - Remainder of current labs are all stable, including cholesterol.  Continue all current medications.  Waiting on Alpha Gal labs.  Any questions?  Needs lab only visit for 2 weeks please. Keep being awesome!!  Thank you for allowing me to participate in your care.  I appreciate you. Kindest regards, Syncere Eble

## 2021-08-11 LAB — ALPHA-GAL PANEL
Allergen Lamb IgE: <0.1 kU/L
Beef IgE: <0.1 kU/L
IgE (Immunoglobulin E), Serum: 328 IU/mL (ref 6–495)
O215-IgE Alpha-Gal: <0.1 kU/L
Pork IgE: <0.1 kU/L

## 2021-08-11 LAB — GAMMA GT: GGT: 14 IU/L (ref 0–60)

## 2021-08-11 NOTE — Progress Notes (Signed)
Please let Jersie know her Alpha Gal labs are negative, no allergy to red meats or pork.

## 2021-08-16 ENCOUNTER — Ambulatory Visit
Admission: RE | Admit: 2021-08-16 | Discharge: 2021-08-16 | Disposition: A | Discharge status: Home / Self Care | Payer: Medicare Other | Source: Ambulatory Visit | Attending: Nurse Practitioner | Admitting: Nurse Practitioner

## 2021-08-16 ENCOUNTER — Encounter: Payer: Self-pay | Admitting: Nurse Practitioner

## 2021-08-16 DIAGNOSIS — K760 Fatty (change of) liver, not elsewhere classified: Secondary | ICD-10-CM | POA: Insufficient documentation

## 2021-08-16 DIAGNOSIS — K8020 Calculus of gallbladder without cholecystitis without obstruction: Secondary | ICD-10-CM | POA: Insufficient documentation

## 2021-08-16 DIAGNOSIS — R1011 Right upper quadrant pain: Secondary | ICD-10-CM

## 2021-08-16 NOTE — Progress Notes (Signed)
Good morning, please let [name] know her imaging returned and gall bladder appears to be filled with stones.  There is no inflammation at this time, but the gall stones could be causing her issues.  There is also noted to be some fatty liver disease, this is commong finding and be intervention for this is focus on healthy diet changes + weight loss with regular activity to help keep liver happy.  For gall stones, would you like referral to general surgery to see if you are candidate for surgery since you are having this ongoing discomfort?  If so let me know and I will place referral.  Any questions? Keep being awesome!!  Thank you for allowing me to participate in your care.  I appreciate you. Kindest regards, Darby Fleeman

## 2021-08-21 NOTE — Patient Instructions (Signed)
Biliary Colic, Adult  Biliary colic is severe pain caused by a problem with the gallbladder. The gallbladder is a small organ in the upper right part of the abdomen. The gallbladder stores a digestive fluid produced in the liver (bile) that helps the body break down fat. Bile and other digestive enzymes are carried from the liver to the small intestine through tube-like structures called bile ducts. The gallbladder and the bile ducts form the biliary tract. Sometimes, hard deposits of digestive fluids (gallstones) form in the gallbladder and block the flow of bile from the gallbladder, causing biliary colic. This condition is also called a gallbladder attack. Gallstones can be as small as a grain of sand or as big as a golf ball. There could be just one gallstone in the gallbladder, or there could be many. What are the causes? This condition is usually caused by gallstones. Less often, a tumor could block the flow of bile from the gallbladder and trigger biliary colic. What increases the risk? The following factors may make you more likely to develop this condition: Being female. Having a family history of gallstones. Being obese. Losing weight suddenly or quickly. Eating a diet that is high in calories, low in fiber, and rich in refined carbohydrates, such as white bread and white rice. Having certain health conditions, such as: An intestinal disease that affects nutrient absorption, such as Crohn's disease. A metabolic condition, such as diabetes or metabolic syndrome. Metabolic syndrome occurs when someone has high blood pressure, high cholesterol, and diabetes. A blood condition, such as hemolytic anemia or sickle cell disease. What are the signs or symptoms? The main symptom of this condition is severe pain in the upper right side of the abdomen. You may feel this pain below the chest but above the hip. This pain often occurs at night or after eating a meal that is high in fat. This pain may  get worse for up to an hour and last as long as 12 hours. In most cases, the pain fades (subsides) within 2 hours. Other symptoms of this condition include: Nausea and vomiting. Pain under the right shoulder. How is this diagnosed? This condition is diagnosed based on your medical history, your symptoms, and a physical exam. You may also have tests, including: Blood tests to rule out infection or inflammation of the bile ducts, gallbladder, pancreas, or liver. Imaging studies, such as: An ultrasound. A CT scan. An MRI. In some cases, you may need to have an imaging study done using a small amount of radioactive material (nuclear medicine) to confirm the diagnosis. How is this treated? This condition may be treated with medicines to: Relieve your pain or nausea. Dissolve the gallstones. It may take months or years before the gallstones are completely gone. If you have gallstones, or if you have a tumor in the gallbladder that is causing biliary colic, you may need surgery to remove the gallbladder (cholecystectomy). Follow these instructions at home: Eating and drinking Drink enough fluid to keep your urine pale yellow. Follow instructions from your health care provider about eating or drinking restrictions. These may include avoiding: Fatty, greasy, and fried foods. Any foods that make the pain worse. Overeating. Having a large meal after not eating for a while. General instructions Take over-the-counter and prescription medicines only as told by your health care provider. Keep all follow-up visits as told by your health care provider. This is important. How is this prevented? Steps to prevent this condition include: Maintaining a healthy body  weight. Getting regular exercise. Eating a healthy diet that is high in fiber and low in fat. Limiting how much sugar and refined carbohydrates you eat. Contact a health care provider if: Your pain lasts more than 5 hours. You vomit. You  have a fever and chills. Your pain gets worse. Get help right away if: Your skin or the whites of your eyes look yellow (jaundice). Your have tea-colored urine and light-colored stools (feces). You are dizzy or you faint. Summary Biliary colic is severe pain caused by a problem with the gallbladder. The gallbladder is a small organ in the upper right part of your abdomen. Treatment for this condition may include medicine to relieve your pain or nausea, or medicine to slowly dissolve the gallstones. If you have gallstones, or if you have a tumor in the gallbladder that is causing biliary colic, you may need surgery to remove the gallbladder (cholecystectomy). This information is not intended to replace advice given to you by your health care provider. Make sure you discuss any questions you have with your health care provider. Document Revised: 01/29/2019 Document Reviewed: 11/20/2018 Elsevier Patient Education  [place].

## 2021-08-21 NOTE — Addendum Note (Signed)
Addended by: Venita Lick on: 08/21/2021 03:38 PM   Modules accepted: Orders

## 2021-08-23 ENCOUNTER — Encounter: Payer: Self-pay | Admitting: Nurse Practitioner

## 2021-08-23 ENCOUNTER — Ambulatory Visit (INDEPENDENT_AMBULATORY_CARE_PROVIDER_SITE_OTHER): Payer: Medicare Other | Admitting: Nurse Practitioner

## 2021-08-23 VITALS — BP 139/72 | HR 68 | Temp 97.8°F | Wt 300.2 lb

## 2021-08-23 DIAGNOSIS — K8020 Calculus of gallbladder without cholecystitis without obstruction: Secondary | ICD-10-CM | POA: Diagnosis not present

## 2021-08-23 DIAGNOSIS — E876 Hypokalemia: Secondary | ICD-10-CM | POA: Diagnosis not present

## 2021-08-23 DIAGNOSIS — I471 Supraventricular tachycardia: Secondary | ICD-10-CM

## 2021-08-23 DIAGNOSIS — K760 Fatty (change of) liver, not elsewhere classified: Secondary | ICD-10-CM | POA: Diagnosis not present

## 2021-08-23 DIAGNOSIS — R1011 Right upper quadrant pain: Secondary | ICD-10-CM

## 2021-08-23 LAB — URINALYSIS, ROUTINE W REFLEX MICROSCOPIC
Bilirubin, UA: NEGATIVE
Glucose, UA: NEGATIVE
Ketones, UA: NEGATIVE
Leukocytes,UA: NEGATIVE
Nitrite, UA: NEGATIVE
Protein,UA: NEGATIVE
RBC, UA: NEGATIVE
Specific Gravity, UA: 1.015 (ref 1.005–1.030)
Urobilinogen, Ur: 0.2 mg/dL (ref 0.2–1.0)
pH, UA: 7 (ref 5.0–7.5)

## 2021-08-23 NOTE — Progress Notes (Signed)
Please let Jasmine Webb know her urine shows no infection present:)

## 2021-08-23 NOTE — Assessment & Plan Note (Signed)
Ongoing and followed by cardiology, will continue Metoprolol as ordered by them.  Recent notes and testing reviewed.  She has no cardiac symptoms at this time.

## 2021-08-23 NOTE — Assessment & Plan Note (Addendum)
Refer to cholelithiasis plan of care.  Obtain urine for dysuria reported and treat as needed dependent on results.

## 2021-08-23 NOTE — Assessment & Plan Note (Signed)
Noted on ultrasound 08/16/21.  Recommend heavy focus on diet and regular exercise. Educated her on this finding.

## 2021-08-23 NOTE — Assessment & Plan Note (Addendum)
Noted on recent imaging with discomfort ongoing.  She is scheduled for this Wednesday with [facility] to further assess and discuss options.  Is aware we would need to hold Eliquis if surgery required.

## 2021-08-23 NOTE — Progress Notes (Signed)
BP 139/72   Pulse 68 Comment: apical  Temp 97.8 F (36.6 C) (Oral)   Wt (!) 300 lb 3.2 oz (136.2 kg)   LMP  (LMP Unknown)   SpO2 94%   BMI 50.58 kg/m    Subjective:    Patient ID: Jasmine Webb, female    DOB: 10-05-1955, 66 y.o.   MRN: [id number]  HPI: [name] is a 66 y.o. female  Chief Complaint  Patient presents with   Abdominal Pain    Patient is here for two week follow up on RUQ Pain. Patient says she is still having the pain and she does not feel good.    ABDOMINAL PAIN  Follow-up today for abdominal pain.  Had recent imaging performed ultrasound RUQ noting extensive cholelithiasis, but no cholecystitis.  Also noted hepatic steatosis.  Recent lab with hypokalemia and hypernatremia noted -- however LFTs stable.  Continues to have colicky pain episodes presenting.  Last saw cardiology 02/17/20 for PSVT + history of PE, takes Eliquis -- we discussed this and plans if surgery needed.  Periop risk 0.4% on RCRI. Duration:months Onset: gradual Severity: 6/10 Quality: sharp, aching, and throbbing Location:  RUQ  Episode duration: 40 minutes or more Radiation: no Frequency: intermittent Alleviating factors: nothing Aggravating factors: unknown Status: fluctuating Treatments attempted: Tylenol Fever:  occasional chills Nausea: no Vomiting: no Weight loss: no Decreased appetite: yes Diarrhea: yes Constipation: yes Blood in stool: occasional with hemorrhoids Heartburn: no Jaundice: no Rash: no Dysuria/urinary frequency: occasional Hematuria: none History of sexually transmitted disease: no Recurrent NSAID use: no   Relevant past medical, surgical, family and social history reviewed and updated as indicated. Interim medical history since our last visit reviewed. Allergies and medications reviewed and updated.  Review of Systems  Constitutional: Negative.   Respiratory: Negative.    Cardiovascular: Negative.   Gastrointestinal:  Positive for abdominal pain,  constipation and diarrhea. Negative for abdominal distention, nausea and vomiting.  Genitourinary:  Positive for dysuria. Negative for difficulty urinating, frequency, hematuria and urgency.  Neurological: Negative.   Psychiatric/Behavioral: Negative.      Per HPI unless specifically indicated above     Objective:    BP 139/72   Pulse 68 Comment: apical  Temp 97.8 F (36.6 C) (Oral)   Wt (!) 300 lb 3.2 oz (136.2 kg)   LMP  (LMP Unknown)   SpO2 94%   BMI 50.58 kg/m   Wt Readings from Last 3 Encounters:  08/23/21 (!) 300 lb 3.2 oz (136.2 kg)  08/09/21 293 lb 9.6 oz (133.2 kg)  04/05/21 293 lb 12.8 oz (133.3 kg)    Physical Exam Vitals and nursing note reviewed.  Constitutional:      General: She is awake. She is not in acute distress.    Appearance: She is well-developed and well-groomed. She is obese. She is not ill-appearing or toxic-appearing.  HENT:     Head: Normocephalic.     Right Ear: Hearing normal.     Left Ear: Hearing normal.  Eyes:     General: Lids are normal.        Right eye: No discharge.        Left eye: No discharge.     Conjunctiva/sclera: Conjunctivae normal.     Pupils: Pupils are equal, round, and reactive to light.  Neck:     Thyroid: No thyromegaly.     Vascular: No carotid bruit.  Cardiovascular:     Rate and Rhythm: Normal rate and regular rhythm.  Heart sounds: Normal heart sounds. No murmur heard.    No gallop.  Pulmonary:     Effort: Pulmonary effort is normal.     Breath sounds: Normal breath sounds.  Abdominal:     General: Bowel sounds are normal. There is no distension.     Palpations: Abdomen is soft. There is no hepatomegaly.     Tenderness: There is abdominal tenderness in the right upper quadrant. There is no right CVA tenderness, left CVA tenderness, guarding or rebound. Positive signs include Murphy's sign.  Musculoskeletal:     Cervical back: Normal range of motion and neck supple.     Right lower leg: 2+ Edema present.      Left lower leg: 2+ Edema present.  Lymphadenopathy:     Cervical: No cervical adenopathy.  Skin:    General: Skin is warm and dry.  Neurological:     Mental Status: She is alert and oriented to person, place, and time.  Psychiatric:        Attention and Perception: Attention normal.        Mood and Affect: Mood normal.        Speech: Speech normal.        Behavior: Behavior normal. Behavior is cooperative.        Thought Content: Thought content normal.     Results for orders placed or performed in visit on 08/09/21  Bayer DCA Hb A1c Waived  Result Value Ref Range   HB A1C (BAYER DCA - WAIVED) 5.8 (H) 4.8 - 5.6 %  CBC with Differential/Platelet  Result Value Ref Range   WBC 5.9 3.4 - 10.8 x10E3/uL   RBC 4.53 3.77 - 5.28 x10E6/uL   Hemoglobin 12.8 11.1 - 15.9 g/dL   Hematocrit 38.6 34.0 - 46.6 %   MCV 85 79 - 97 fL   MCH 28.3 26.6 - 33.0 pg   MCHC 33.2 31.5 - 35.7 g/dL   RDW 12.9 11.7 - 15.4 %   Platelets 177 150 - 450 x10E3/uL   Neutrophils 64 Not Estab. %   Lymphs 23 Not Estab. %   Monocytes 10 Not Estab. %   Eos 3 Not Estab. %   Basos 0 Not Estab. %   Neutrophils Absolute 3.8 1.4 - 7.0 x10E3/uL   Lymphocytes Absolute 1.3 0.7 - 3.1 x10E3/uL   Monocytes Absolute 0.6 0.1 - 0.9 x10E3/uL   EOS (ABSOLUTE) 0.2 0.0 - 0.4 x10E3/uL   Basophils Absolute 0.0 0.0 - 0.2 x10E3/uL   Immature Granulocytes 0 Not Estab. %   Immature Grans (Abs) 0.0 0.0 - 0.1 x10E3/uL  Comprehensive metabolic panel  Result Value Ref Range   Glucose 100 (H) 70 - 99 mg/dL   BUN 10 8 - 27 mg/dL   Creatinine, Ser 0.72 0.57 - 1.00 mg/dL   eGFR 92 >59 mL/min/1.73   BUN/Creatinine Ratio 14 12 - 28   Sodium 145 (H) 134 - 144 mmol/L   Potassium 3.3 (L) 3.5 - 5.2 mmol/L   Chloride 105 96 - 106 mmol/L   CO2 25 20 - 29 mmol/L   Calcium 9.3 8.7 - 10.3 mg/dL   Total Protein 6.1 6.0 - 8.5 g/dL   Albumin 3.8 (L) 3.9 - 4.9 g/dL   Globulin, Total 2.3 1.5 - 4.5 g/dL   Albumin/Globulin Ratio 1.7 1.2 - 2.2    Bilirubin Total 1.6 (H) 0.0 - 1.2 mg/dL   Alkaline Phosphatase 78 44 - 121 IU/L   AST 15 0 - 40 IU/L  ALT 12 0 - 32 IU/L  Lipid Panel w/o Chol/HDL Ratio  Result Value Ref Range   Cholesterol, Total 112 100 - 199 mg/dL   Triglycerides 102 0 - 149 mg/dL   HDL 48 >39 mg/dL   VLDL Cholesterol Cal 19 5 - 40 mg/dL   LDL Chol Calc (NIH) 45 0 - 99 mg/dL  TSH  Result Value Ref Range   TSH 3.760 0.450 - 4.500 uIU/mL  Alpha-Gal Panel  Result Value Ref Range   Class Description Allergens Comment    IgE (Immunoglobulin E), Serum 328 6 - 495 IU/mL   O215-IgE Alpha-Gal <0.10 Class 0 kU/L   Beef IgE <0.10 Class 0 kU/L   Pork IgE <0.10 Class 0 kU/L   Allergen Lamb IgE <0.10 Class 0 kU/L  Gamma GT  Result Value Ref Range   GGT 14 0 - 60 IU/L      Assessment & Plan:   Problem List Items Addressed This Visit       Cardiovascular and Mediastinum   SVT (supraventricular tachycardia) (HCC) - Primary    Ongoing and followed by cardiology, will continue Metoprolol as ordered by them.  Recent notes and testing reviewed.  She has no cardiac symptoms at this time.        Digestive   Cholelithiasis    Noted on recent imaging with discomfort ongoing.  She is scheduled for this Wednesday with [facility] to further assess and discuss options.  Is aware we would need to hold Eliquis if surgery required.      Relevant Orders   Comprehensive metabolic panel   CBC with Differential/Platelet   Hepatic steatosis    Noted on ultrasound 08/16/21.  Recommend heavy focus on diet and regular exercise. Educated her on this finding.      Relevant Orders   Comprehensive metabolic panel     Other   RUQ pain    Refer to cholelithiasis plan of care.  Obtain urine for dysuria reported and treat as needed dependent on results.      Relevant Orders   Comprehensive metabolic panel   Urinalysis, Routine w reflex microscopic   CBC with Differential/Platelet   Other Visit Diagnoses     Hypokalemia        Recheck on labs today.   Relevant Orders   Comprehensive metabolic panel        Follow up plan: Return in about 2 weeks (around 09/06/2021) for RUQ PAIN.

## 2021-08-24 ENCOUNTER — Other Ambulatory Visit: Payer: Self-pay | Admitting: Nurse Practitioner

## 2021-08-24 DIAGNOSIS — E876 Hypokalemia: Secondary | ICD-10-CM

## 2021-08-24 LAB — COMPREHENSIVE METABOLIC PANEL
ALT: 10 IU/L (ref 0–32)
AST: 14 IU/L (ref 0–40)
Albumin/Globulin Ratio: 2 (ref 1.2–2.2)
Albumin: 4.2 g/dL (ref 3.9–4.9)
Alkaline Phosphatase: 87 IU/L (ref 44–121)
BUN/Creatinine Ratio: 17 (ref 12–28)
BUN: 12 mg/dL (ref 8–27)
Bilirubin Total: 1.6 mg/dL — ABNORMAL HIGH (ref 0.0–1.2)
CO2: 27 mmol/L (ref 20–29)
Calcium: 9.2 mg/dL (ref 8.7–10.3)
Chloride: 105 mmol/L (ref 96–106)
Creatinine, Ser: 0.69 mg/dL (ref 0.57–1.00)
Globulin, Total: 2.1 g/dL (ref 1.5–4.5)
Glucose: 105 mg/dL — ABNORMAL HIGH (ref 70–99)
Potassium: 3.4 mmol/L — ABNORMAL LOW (ref 3.5–5.2)
Sodium: 135 mmol/L (ref 134–144)
Total Protein: 6.3 g/dL (ref 6.0–8.5)
eGFR: 95 mL/min/{1.73_m2} (ref >59)

## 2021-08-24 LAB — CBC WITH DIFFERENTIAL/PLATELET
Basophils Absolute: 0 10*3/uL (ref 0.0–0.2)
Basos: 1 %
EOS (ABSOLUTE): 0.2 10*3/uL (ref 0.0–0.4)
Eos: 3 %
Hematocrit: 39.2 % (ref 34.0–46.6)
Hemoglobin: 12.5 g/dL (ref 11.1–15.9)
Immature Grans (Abs): 0 10*3/uL (ref 0.0–0.1)
Immature Granulocytes: 0 %
Lymphocytes Absolute: 1.2 10*3/uL (ref 0.7–3.1)
Lymphs: 20 %
MCH: 27.9 pg (ref 26.6–33.0)
MCHC: 31.9 g/dL (ref 31.5–35.7)
MCV: 88 fL (ref 79–97)
Monocytes Absolute: 0.4 10*3/uL (ref 0.1–0.9)
Monocytes: 7 %
Neutrophils Absolute: 4 10*3/uL (ref 1.4–7.0)
Neutrophils: 69 %
Platelets: 189 10*3/uL (ref 150–450)
RBC: 4.48 x10E6/uL (ref 3.77–5.28)
RDW: 12.8 % (ref 11.7–15.4)
WBC: 5.8 10*3/uL (ref 3.4–10.8)

## 2021-08-24 MED ORDER — POTASSIUM CHLORIDE CRYS ER 10 MEQ PO TBCR: 10.0000 meq | EXTENDED_RELEASE_TABLET | Freq: Every day | ORAL | 0 refills | Status: DC

## 2021-08-24 NOTE — Progress Notes (Signed)
Good morning, please let Jasmine Webb know her labs have returned: - Potassium continues to be on lower side, I am sending in a dose of daily potassium for her to start taking once a day low dose and would like her to return for lab visit only in one week to recheck levels.  Low potassium could be coming from Lasix use which will at times push potassium down, meaning she will need to add more potassium rich foods to diet.  - CBC is stable and shows no infection.  Kidney and liver function are normal.  Any questions?  Please schedule lab only visit for next week:) Keep being amazing!!  Thank you for allowing me to participate in your care.  I appreciate you. Kindest regards, Kalianne Fetting

## 2021-08-25 ENCOUNTER — Other Ambulatory Visit: Payer: Self-pay | Admitting: Cardiovascular Disease

## 2021-08-25 ENCOUNTER — Telehealth: Payer: Self-pay

## 2021-08-25 ENCOUNTER — Encounter: Payer: Self-pay | Admitting: Surgery

## 2021-08-25 ENCOUNTER — Ambulatory Visit: Payer: Medicare Other | Admitting: Surgery

## 2021-08-25 ENCOUNTER — Telehealth: Payer: Self-pay | Admitting: Cardiovascular Disease

## 2021-08-25 VITALS — BP 142/88 | HR 67 | Temp 97.9°F | Ht 64.0 in | Wt 295.2 lb

## 2021-08-25 DIAGNOSIS — K8020 Calculus of gallbladder without cholecystitis without obstruction: Secondary | ICD-10-CM | POA: Diagnosis not present

## 2021-08-25 NOTE — Telephone Encounter (Signed)
Please arrange virtual telephone visit for cardiac clearance either for this Thursday or Friday.  Surgery is next Tuesday.  Please inform the patient to contact her PCP to figure out the holding time for Eliquis prior to cholecystectomy.  Eliquis was prescribed for history of PE.  Nuclear stress test obtained in 2022 was normal.

## 2021-08-25 NOTE — Telephone Encounter (Signed)
   Pre-operative Risk Assessment    Patient Name: Jasmine Webb  DOB: 06-Nov-1955 MRN: [id number]      Request for Surgical Clearance    Procedure:   Robotic cholecystectomy   Date of Surgery:  Clearance 08/31/21                                 Surgeon:  Dr Dahlia Byes Surgeon's Group or Practice Name:  [facility] Phone number:  [phone] Fax number:  [phone]   Type of Clearance Requested:   - Pharmacy:  Hold Apixaban (Eliquis) instructions   Type of Anesthesia:  General    Additional requests/questions:    Manfred Arch   08/25/2021, 3:26 PM

## 2021-08-25 NOTE — H&P (View-Only) (Signed)
Patient ID: Jasmine Webb, female   DOB: [birth year], 66 y.o.   MRN: [id number]  HPI Jasmine Webb is a 66 y.o. female seen in consultation at the request of Ms. Cannady NP for symptomatic cholelithiasis.  She reports a few week history of right upper quadrant epigastric pain that is moderate worsening with certain meals and intermittent.  She describes the pain as sharp.  She also had associated diarrhea.  She denies any fevers and chills.  She walks with a cane.  She had a prior history of PE and DVT in wears compression stocking. She had ultrasound that I personally reviewed showing evidence of cholelithiasis without definitive cholecystitis normal common bile duct.  CBC and CMP is normal.  No recent chest pains.  She sees Dr. Fletcher Anon from cardiology.  Recent echo reviewed showing evidence of preserved ejection fraction Prior abdominal operations include hysterectomy  HPI  Past Medical History:  Diagnosis Date   Arthritis    Hyperlipidemia    Hypertension    Tumor    breast and pelvic   Vertigo     Past Surgical History:  Procedure Laterality Date   ABDOMINAL HYSTERECTOMY  2003   BREAST SURGERY     PULMONARY THROMBECTOMY N/A 10/10/2019   Procedure: PULMONARY THROMBECTOMY;  Surgeon: Algernon Huxley, MD;  Location: [facility];  Service: Cardiovascular;  Laterality: N/A;    Family History  Problem Relation Age of Onset   COPD Mother    ALS Father    Heart disease Sister    Breast cancer Sister    Colon cancer Sister    Diabetes Brother    Hypertension Brother    Asthma Son    Sinusitis Son    Congestive Heart Failure Maternal Uncle     Social History Social History   Tobacco Use   Smoking status: Never   Smokeless tobacco: Never  Vaping Use   Vaping Use: Never used  Substance Use Topics   Alcohol use: Never   Drug use: Never    Allergies  Allergen Reactions   Fish Allergy    Ibuprofen     Told to avoid ibuprofen related products by doctor.    Current  Outpatient Medications  Medication Sig Dispense Refill   acetaminophen (TYLENOL) 500 MG tablet Take 500 mg by mouth every 6 (six) hours as needed.     apixaban (ELIQUIS) 5 MG TABS tablet Take 1 tablet (5 mg total) by mouth 2 (two) times daily. 180 tablet 4   atorvastatin (LIPITOR) 40 MG tablet Take 1 tablet (40 mg total) by mouth daily. 90 tablet 4   fexofenadine (ALLEGRA ALLERGY) 180 MG tablet Take 1 tablet (180 mg total) by mouth daily. 10 tablet 1   fluticasone (FLONASE) 50 MCG/ACT nasal spray Place 2 sprays into both nostrils daily.     furosemide (LASIX) 20 MG tablet TAKE 1 TABLET BY MOUTH TWICE A DAY AS NEEDED FOR EDEMA 180 tablet 4   levocetirizine (XYZAL) 5 MG tablet TAKE 1 TABLET BY MOUTH EVERY DAY IN THE EVENING 90 tablet 4   losartan (COZAAR) 100 MG tablet Take 1 tablet (100 mg total) by mouth daily. 90 tablet 4   metoprolol tartrate (LOPRESSOR) 25 MG tablet TAKE 1 TABLET BY MOUTH TWICE A DAY 180 tablet 0   potassium chloride (KLOR-CON M) 10 MEQ tablet Take 1 tablet (10 mEq total) by mouth daily. 7 tablet 0   [START ON 01/20/2022] Zoster Vaccine Adjuvanted [facility]) injection Inject 0.5 mLs into the  muscle once for 1 dose. Dose # 2 0.5 mL 0   No current facility-administered medications for this visit.     Review of Systems Full ROS  was asked and was negative except for the information on the HPI  Physical Exam Blood pressure (!) 142/88, pulse 67, temperature 97.9 F (36.6 C), temperature source Oral, height '5\' 4"'$  (1.626 m), weight 295 lb 3.2 oz (133.9 kg), SpO2 95 %. CONSTITUTIONAL: NAD, BMI 50 chronically ill and debilitated, wear a cane, move slow. EYES: Pupils are equal, round,  Sclera are non-icteric. EARS, NOSE, MOUTH AND THROAT: . The oral mucosa is pink and moist. Hearing is intact to voice. LYMPH NODES:  Lymph nodes in the neck are normal. RESPIRATORY:  Lungs are clear. There is normal respiratory effort, with equal breath sounds bilaterally, and without pathologic  use of accessory muscles. CARDIOVASCULAR: Heart is regular without murmurs, gallops, or rubs. GI: The abdomen is  soft, Tender to palpation on RUQ, and nondistended. There are no palpable masses. There is no hepatosplenomegaly. There are normal bowel sounds in all quadrants. GU: Rectal deferred.   MUSCULOSKELETAL: Normal muscle strength and tone. No cyanosis or edema.   SKIN: Turgor is good and there are no pathologic skin lesions or ulcers. NEUROLOGIC: Motor and sensation is grossly normal. Cranial nerves are grossly intact. PSYCH:  Oriented to person, place and time. Affect is normal.  Data Reviewed  I have personally reviewed the patient's imaging, laboratory findings and medical records.    Assessment/Plan Jasmine Webb is a 66 year old female with high BMI anticoagulated for prior PE presents with what seems to be chronic cholecystitis.  Discussed with the patient in detail and I definitely recommend cholecystectomy.  She is in agreement.  We will make sure that we will touch base with cardiology regarding her anticoagulation optimization.  I do not think we have much time as she is still have symptoms she will likely benefit from a prompt cholecystectomy. I discussed the procedure in detail.  The patient was given Neurosurgeon.  We discussed the risks and benefits of a laparoscopic cholecystectomy and possible cholangiogram including, but not limited to bleeding, infection, injury to surrounding structures such as the intestine or liver, bile leak, retained gallstones, need to convert to an open procedure, prolonged diarrhea, blood clots such as  DVT, common bile duct injury, anesthesia risks, and possible need for additional procedures.  The likelihood of improvement in symptoms and return to the patient's normal status is good. We discussed the typical post-operative recovery course. I discussed the procedure in detail.  The patient was given Neurosurgeon.  We discussed the risks and  benefits of a laparoscopic cholecystectomy and possible cholangiogram including, but not limited to bleeding, infection, injury to surrounding structures such as the intestine or liver, bile leak, retained gallstones, need to convert to an open procedure, prolonged diarrhea, blood clots such as  DVT, common bile duct injury, anesthesia risks, and possible need for additional procedures.  The likelihood of improvement in symptoms and return to the patient's normal status is good. We discussed the typical post-operative recovery course.I discussed the procedure in detail.  The patient was given Neurosurgeon.  We discussed the risks and benefits of a laparoscopic cholecystectomy and possible cholangiogram including, but not limited to bleeding, infection, injury to surrounding structures such as the intestine or liver, bile leak, retained gallstones, need to convert to an open procedure, prolonged diarrhea, blood clots such as  DVT, common bile duct injury, anesthesia risks,  and possible need for additional procedures.  The likelihood of improvement in symptoms and return to the patient's normal status is good. We discussed the typical post-operative recovery course.  We will tentatively schedule for robotic cholecystectomy next week. Please note that I spent greater than 60 minutes in this encounter including personally reviewing imaging studies, coordinating her care, placing orders, counseling the patient and performing a proper documentation  Caroleen Hamman, MD [facility] 08/25/2021, 7:52 PM

## 2021-08-25 NOTE — Progress Notes (Signed)
Patient ID: Jasmine Webb, female   DOB: [birth year], 66 y.o.   MRN: [id number]  HPI Jasmine Webb is a 66 y.o. female seen in consultation at the request of Ms. Cannady NP for symptomatic cholelithiasis.  She reports a few week history of right upper quadrant epigastric pain that is moderate worsening with certain meals and intermittent.  She describes the pain as sharp.  She also had associated diarrhea.  She denies any fevers and chills.  She walks with a cane.  She had a prior history of PE and DVT in wears compression stocking. She had ultrasound that I personally reviewed showing evidence of cholelithiasis without definitive cholecystitis normal common bile duct.  CBC and CMP is normal.  No recent chest pains.  She sees Dr. Fletcher Anon from cardiology.  Recent echo reviewed showing evidence of preserved ejection fraction Prior abdominal operations include hysterectomy  HPI  Past Medical History:  Diagnosis Date   Arthritis    Hyperlipidemia    Hypertension    Tumor    breast and pelvic   Vertigo     Past Surgical History:  Procedure Laterality Date   ABDOMINAL HYSTERECTOMY  2003   BREAST SURGERY     PULMONARY THROMBECTOMY N/A 10/10/2019   Procedure: PULMONARY THROMBECTOMY;  Surgeon: Algernon Huxley, MD;  Location: [facility];  Service: Cardiovascular;  Laterality: N/A;    Family History  Problem Relation Age of Onset   COPD Mother    ALS Father    Heart disease Sister    Breast cancer Sister    Colon cancer Sister    Diabetes Brother    Hypertension Brother    Asthma Son    Sinusitis Son    Congestive Heart Failure Maternal Uncle     Social History Social History   Tobacco Use   Smoking status: Never   Smokeless tobacco: Never  Vaping Use   Vaping Use: Never used  Substance Use Topics   Alcohol use: Never   Drug use: Never    Allergies  Allergen Reactions   Fish Allergy    Ibuprofen     Told to avoid ibuprofen related products by doctor.    Current  Outpatient Medications  Medication Sig Dispense Refill   acetaminophen (TYLENOL) 500 MG tablet Take 500 mg by mouth every 6 (six) hours as needed.     apixaban (ELIQUIS) 5 MG TABS tablet Take 1 tablet (5 mg total) by mouth 2 (two) times daily. 180 tablet 4   atorvastatin (LIPITOR) 40 MG tablet Take 1 tablet (40 mg total) by mouth daily. 90 tablet 4   fexofenadine (ALLEGRA ALLERGY) 180 MG tablet Take 1 tablet (180 mg total) by mouth daily. 10 tablet 1   fluticasone (FLONASE) 50 MCG/ACT nasal spray Place 2 sprays into both nostrils daily.     furosemide (LASIX) 20 MG tablet TAKE 1 TABLET BY MOUTH TWICE A DAY AS NEEDED FOR EDEMA 180 tablet 4   levocetirizine (XYZAL) 5 MG tablet TAKE 1 TABLET BY MOUTH EVERY DAY IN THE EVENING 90 tablet 4   losartan (COZAAR) 100 MG tablet Take 1 tablet (100 mg total) by mouth daily. 90 tablet 4   metoprolol tartrate (LOPRESSOR) 25 MG tablet TAKE 1 TABLET BY MOUTH TWICE A DAY 180 tablet 0   potassium chloride (KLOR-CON M) 10 MEQ tablet Take 1 tablet (10 mEq total) by mouth daily. 7 tablet 0   [START ON 01/20/2022] Zoster Vaccine Adjuvanted [facility]) injection Inject 0.5 mLs into the  muscle once for 1 dose. Dose # 2 0.5 mL 0   No current facility-administered medications for this visit.     Review of Systems Full ROS  was asked and was negative except for the information on the HPI  Physical Exam Blood pressure (!) 142/88, pulse 67, temperature 97.9 F (36.6 C), temperature source Oral, height '5\' 4"'$  (1.626 m), weight 295 lb 3.2 oz (133.9 kg), SpO2 95 %. CONSTITUTIONAL: NAD, BMI 50 chronically ill and debilitated, wear a cane, move slow. EYES: Pupils are equal, round,  Sclera are non-icteric. EARS, NOSE, MOUTH AND THROAT: . The oral mucosa is pink and moist. Hearing is intact to voice. LYMPH NODES:  Lymph nodes in the neck are normal. RESPIRATORY:  Lungs are clear. There is normal respiratory effort, with equal breath sounds bilaterally, and without pathologic  use of accessory muscles. CARDIOVASCULAR: Heart is regular without murmurs, gallops, or rubs. GI: The abdomen is  soft, Tender to palpation on RUQ, and nondistended. There are no palpable masses. There is no hepatosplenomegaly. There are normal bowel sounds in all quadrants. GU: Rectal deferred.   MUSCULOSKELETAL: Normal muscle strength and tone. No cyanosis or edema.   SKIN: Turgor is good and there are no pathologic skin lesions or ulcers. NEUROLOGIC: Motor and sensation is grossly normal. Cranial nerves are grossly intact. PSYCH:  Oriented to person, place and time. Affect is normal.  Data Reviewed  I have personally reviewed the patient's imaging, laboratory findings and medical records.    Assessment/Plan Jasmine Webb is a 66 year old female with high BMI anticoagulated for prior PE presents with what seems to be chronic cholecystitis.  Discussed with the patient in detail and I definitely recommend cholecystectomy.  She is in agreement.  We will make sure that we will touch base with cardiology regarding her anticoagulation optimization.  I do not think we have much time as she is still have symptoms she will likely benefit from a prompt cholecystectomy. I discussed the procedure in detail.  The patient was given Neurosurgeon.  We discussed the risks and benefits of a laparoscopic cholecystectomy and possible cholangiogram including, but not limited to bleeding, infection, injury to surrounding structures such as the intestine or liver, bile leak, retained gallstones, need to convert to an open procedure, prolonged diarrhea, blood clots such as  DVT, common bile duct injury, anesthesia risks, and possible need for additional procedures.  The likelihood of improvement in symptoms and return to the patient's normal status is good. We discussed the typical post-operative recovery course. I discussed the procedure in detail.  The patient was given Neurosurgeon.  We discussed the risks and  benefits of a laparoscopic cholecystectomy and possible cholangiogram including, but not limited to bleeding, infection, injury to surrounding structures such as the intestine or liver, bile leak, retained gallstones, need to convert to an open procedure, prolonged diarrhea, blood clots such as  DVT, common bile duct injury, anesthesia risks, and possible need for additional procedures.  The likelihood of improvement in symptoms and return to the patient's normal status is good. We discussed the typical post-operative recovery course.I discussed the procedure in detail.  The patient was given Neurosurgeon.  We discussed the risks and benefits of a laparoscopic cholecystectomy and possible cholangiogram including, but not limited to bleeding, infection, injury to surrounding structures such as the intestine or liver, bile leak, retained gallstones, need to convert to an open procedure, prolonged diarrhea, blood clots such as  DVT, common bile duct injury, anesthesia risks,  and possible need for additional procedures.  The likelihood of improvement in symptoms and return to the patient's normal status is good. We discussed the typical post-operative recovery course.  We will tentatively schedule for robotic cholecystectomy next week. Please note that I spent greater than 60 minutes in this encounter including personally reviewing imaging studies, coordinating her care, placing orders, counseling the patient and performing a proper documentation  Caroleen Hamman, MD [facility] 08/25/2021, 7:52 PM

## 2021-08-25 NOTE — Patient Instructions (Addendum)
Our surgery scheduler Pamala Hurry will call you within 24-48 hours to get you scheduled. If you have not heard from her after 48 hours, please call our office. Have the blue sheet available when she calls to write down important information.  STOP Eliquis 2 days prior to surgery.   If you have any concerns or questions, please feel free to call our office.   Minimally Invasive Cholecystectomy Minimally invasive cholecystectomy is surgery to remove the gallbladder. The gallbladder is a pear-shaped organ that lies beneath the liver on the right side of the body. The gallbladder stores bile, which is a fluid that helps the body digest fats. Cholecystectomy is often done to treat inflammation (irritation and swelling) of the gallbladder (cholecystitis). This condition is usually caused by a buildup of gallstones (cholelithiasis) in the gallbladder or when the fluid in the gall bladder becomes stagnant because gallstones get stuck in the ducts (tubes) and block the flow of bile. This can result in inflammation and pain. In severe cases, emergency surgery may be required. This procedure is done through small incisions in the abdomen, instead of one large incision. It is also called laparoscopic surgery. A thin scope with a camera (laparoscope) is inserted through one incision. Then surgical instruments are inserted through the other incisions. In some cases, a minimally invasive surgery may need to be changed to a surgery that is done through a larger incision. This is called open surgery. Tell a health care provider about: Any allergies you have. All medicines you are taking, including vitamins, herbs, eye drops, creams, and over-the-counter medicines. Any problems you or family members have had with anesthetic medicines. Any bleeding problems you have. Any surgeries you have had. Any medical conditions you have. Whether you are pregnant or may be pregnant. What are the risks? Generally, this is a safe  procedure. However, problems may occur, including: Infection. Bleeding. Allergic reactions to medicines. Damage to nearby structures or organs. A gallstone remaining in the common bile duct. The common bile duct carries bile from the gallbladder to the small intestine. A bile leak from the liver or cystic duct after your gallbladder is removed. What happens before the procedure? When to stop eating and drinking Follow instructions from your health care provider about what you may eat and drink before your procedure. These may include: 8 hours before the procedure Stop eating most foods. Do not eat meat, fried foods, or fatty foods. Eat only light foods, such as toast or crackers. All liquids are okay except energy drinks and alcohol. 6 hours before the procedure Stop eating. Drink only clear liquids, such as water, clear fruit juice, black coffee, plain tea, and sports drinks. Do not drink energy drinks or alcohol. 2 hours before the procedure Stop drinking all liquids. You may be allowed to take medicines with small sips of water. If you do not follow your health care provider's instructions, your procedure may be delayed or canceled. Medicines Ask your health care provider about: Changing or stopping your regular medicines. This is especially important if you are taking diabetes medicines or blood thinners. Taking medicines such as aspirin and ibuprofen. These medicines can thin your blood. Do not take these medicines unless your health care provider tells you to take them. Taking over-the-counter medicines, vitamins, herbs, and supplements. General instructions If you will be going home right after the procedure, plan to have a responsible adult: Take you home from the hospital or clinic. You will not be allowed to drive. Care for  you for the time you are told. Do not use any products that contain nicotine or tobacco for at least 4 weeks before the procedure. These products include  cigarettes, chewing tobacco, and vaping devices, such as e-cigarettes. If you need help quitting, ask your health care provider. Ask your health care provider: How your surgery site will be marked. What steps will be taken to help prevent infection. These may include: Removing hair at the surgery site. Washing skin with a germ-killing soap. Taking antibiotic medicine. What happens during the procedure?  An IV will be inserted into one of your veins. You will be given one or both of the following: A medicine to help you relax (sedative). A medicine to make you fall asleep (general anesthetic). Your surgeon will make several small incisions in your abdomen. The laparoscope will be inserted through one of the small incisions. The camera on the laparoscope will send images to a monitor in the operating room. This lets your surgeon see inside your abdomen. A gas will be pumped into your abdomen. This will expand your abdomen to give the surgeon more room to perform the surgery. Other tools that are needed for the procedure will be inserted through the other incisions. The gallbladder will be removed through one of the incisions. Your common bile duct may be examined. If stones are found in the common bile duct, they may be removed. After your gallbladder has been removed, the incisions will be closed with stitches (sutures), staples, or skin glue. Your incisions will be covered with a bandage (dressing). The procedure may vary among health care providers and hospitals. What happens after the procedure? Your blood pressure, heart rate, breathing rate, and blood oxygen level will be monitored until you leave the hospital or clinic. You will be given medicines as needed to control your pain. You may have a drain placed in the incision. The drain will be removed a day or two after the procedure. Summary Minimally invasive cholecystectomy, also called laparoscopic cholecystectomy, is surgery to  remove the gallbladder using small incisions. Tell your health care provider about all the medical conditions you have and all the medicines you are taking for those conditions. Before the procedure, follow instructions about when to stop eating and drinking and changing or stopping medicines. Plan to have a responsible adult care for you for the time you are told after you leave the hospital or clinic. This information is not intended to replace advice given to you by your health care provider. Make sure you discuss any questions you have with your health care provider. Document Revised: 07/21/2020 Document Reviewed: 07/21/2020 Elsevier Patient Education  [employer].

## 2021-08-25 NOTE — Telephone Encounter (Signed)
Faxed cardiac clearance to Dr. Kathlyn Sacramento at [phone].

## 2021-08-26 ENCOUNTER — Telehealth: Payer: Self-pay | Admitting: *Deleted

## 2021-08-26 ENCOUNTER — Telehealth: Payer: Self-pay | Admitting: Surgery

## 2021-08-26 NOTE — Telephone Encounter (Signed)
Pt agreeable to plan of care for tele pre op appt 08/27/21 @ 10:40. Med rec and consent are done.Jasmine Webb to add on urgent pre op add on

## 2021-08-26 NOTE — Telephone Encounter (Signed)
Pt agreeable to plan of care for tele pre op appt 08/27/21 @ 10:40. Med rec and consent are done..      Patient Consent for Virtual Visit        Jasmine Webb has provided verbal consent on 08/26/2021 for a virtual visit (video or telephone).   CONSENT FOR VIRTUAL VISIT FOR:  Jasmine Webb  By participating in this virtual visit I agree to the following:  I hereby voluntarily request, consent and authorize [facility] and its employed or contracted physicians, physician assistants, nurse practitioners or other licensed health care professionals (the Practitioner), to provide me with telemedicine health care services (the "Services") as deemed necessary by the treating Practitioner. I acknowledge and consent to receive the Services by the Practitioner via telemedicine. I understand that the telemedicine visit will involve communicating with the Practitioner through live audiovisual communication technology and the disclosure of certain medical information by electronic transmission. I acknowledge that I have been given the opportunity to request an in-person assessment or other available alternative prior to the telemedicine visit and am voluntarily participating in the telemedicine visit.  I understand that I have the right to withhold or withdraw my consent to the use of telemedicine in the course of my care at any time, without affecting my right to future care or treatment, and that the Practitioner or I may terminate the telemedicine visit at any time. I understand that I have the right to inspect all information obtained and/or recorded in the course of the telemedicine visit and may receive copies of available information for a reasonable fee.  I understand that some of the potential risks of receiving the Services via telemedicine include:  Delay or interruption in medical evaluation due to technological equipment failure or disruption; Information transmitted may not be sufficient (e.g. poor  resolution of images) to allow for appropriate medical decision making by the Practitioner; and/or  In rare instances, security protocols could fail, causing a breach of personal health information.  Furthermore, I acknowledge that it is my responsibility to provide information about my medical history, conditions and care that is complete and accurate to the best of my ability. I acknowledge that Practitioner's advice, recommendations, and/or decision may be based on factors not within their control, such as incomplete or inaccurate data provided by me or distortions of diagnostic images or specimens that may result from electronic transmissions. I understand that the practice of medicine is not an exact science and that Practitioner makes no warranties or guarantees regarding treatment outcomes. I acknowledge that a copy of this consent can be made available to me via my patient portal ([employer] [employer]), or I can request a printed copy by calling the office of [facility].    I understand that my insurance will be billed for this visit.   I have read or had this consent read to me. I understand the contents of this consent, which adequately explains the benefits and risks of the Services being provided via telemedicine.  I have been provided ample opportunity to ask questions regarding this consent and the Services and have had my questions answered to my satisfaction. I give my informed consent for the services to be provided through the use of telemedicine in my medical care

## 2021-08-26 NOTE — Telephone Encounter (Signed)
Patient has been advised of Pre-Admission date/time, and Surgery date.  Surgery Date: 08/31/21 @ [facility] Preadmission Testing Date: 08/30/21 (phone 8a-1p)  Patient has been made aware to call [phone], between 1-3:00pm the day before surgery, to find out what time to arrive for surgery.

## 2021-08-26 NOTE — Telephone Encounter (Signed)
Left message to call back ASAP for tele appt tomorrow.

## 2021-08-26 NOTE — Telephone Encounter (Signed)
Pt returning nurse's call. Please advise

## 2021-08-27 ENCOUNTER — Ambulatory Visit (INDEPENDENT_AMBULATORY_CARE_PROVIDER_SITE_OTHER): Payer: Medicare Other | Admitting: Nurse Practitioner

## 2021-08-27 DIAGNOSIS — Z01810 Encounter for preprocedural cardiovascular examination: Secondary | ICD-10-CM

## 2021-08-27 NOTE — Progress Notes (Unsigned)
Cardiology clearance has been received from [facility]. The patient is cleared for surgery at moderate risk. All notes are in Epic.

## 2021-08-27 NOTE — Progress Notes (Signed)
Virtual Visit via Telephone Note   Because of Jasmine Webb's co-morbid illnesses, she is at least at moderate risk for complications without adequate follow up.  This format is felt to be most appropriate for this patient at this time.  The patient did not have access to video technology/had technical difficulties with video requiring transitioning to audio format only (telephone).  All issues noted in this document were discussed and addressed.  No physical exam could be performed with this format.  Please refer to the patient's chart for her consent to telehealth for [facility]  Evaluation Performed:  Preoperative cardiovascular risk assessment _____________   Date:  08/27/2021   Patient ID:  Jasmine Webb, DOB 1955-02-12, MRN [id number] Patient Location:  Home Provider location:   Office  Primary Care Provider:  Venita Lick, NP Primary Cardiologist:  Kathlyn Sacramento, MD  Chief Complaint / Patient Profile   66 y.o. y/o female with a h/o PSVT, exertional dyspnea, hypertension, hyperlipidemia, and PE who is pending robotic cholecystectomy on 08/31/2021 with Dr. Dahlia Byes of [facility] and presents today for telephonic preoperative cardiovascular risk assessment.  Past Medical History    Past Medical History:  Diagnosis Date   Arthritis    Hyperlipidemia    Hypertension    Tumor    breast and pelvic   Vertigo    Past Surgical History:  Procedure Laterality Date   ABDOMINAL HYSTERECTOMY  2003   BREAST SURGERY     PULMONARY THROMBECTOMY N/A 10/10/2019   Procedure: PULMONARY THROMBECTOMY;  Surgeon: Algernon Huxley, MD;  Location: [facility];  Service: Cardiovascular;  Laterality: N/A;    Allergies  Allergies  Allergen Reactions   Fish Allergy    Ibuprofen     Told to avoid ibuprofen related products by doctor.    History of Present Illness    Jasmine Webb is a 66 y.o. female who presents via audio/video conferencing for a telehealth visit  today.  Pt was last seen in cardiology clinic on 12/17/2020 by Dr. Fletcher Anon.  At that time Zriyah Kopplin was doing well. The patient is now pending procedure as outlined above. Since her last visit, she has done well from a cardiac standpoint. She denies chest pain, palpitations, dyspnea, pnd, orthopnea, n, v, dizziness, syncope, edema, weight gain, or early satiety. All other systems reviewed and are otherwise negative except as noted above.   Home Medications    Prior to Admission medications   Medication Sig Start Date End Date Taking? Authorizing Provider  acetaminophen (TYLENOL) 500 MG tablet Take 500 mg by mouth every 6 (six) hours as needed.    [provider]  apixaban (ELIQUIS) 5 MG TABS tablet Take 1 tablet (5 mg total) by mouth 2 (two) times daily. 02/08/21   Cannady, Henrine Screws T, NP  atorvastatin (LIPITOR) 40 MG tablet Take 1 tablet (40 mg total) by mouth daily. 02/08/21   Cannady, Henrine Screws T, NP  fexofenadine (ALLEGRA ALLERGY) 180 MG tablet Take 1 tablet (180 mg total) by mouth daily. 04/05/21   Vigg, Avanti, MD  fluticasone (FLONASE) 50 MCG/ACT nasal spray Place 2 sprays into both nostrils daily.    [provider]  furosemide (LASIX) 20 MG tablet TAKE 1 TABLET BY MOUTH TWICE A DAY AS NEEDED FOR EDEMA 08/09/21   Cannady, Jolene T, NP  levocetirizine (XYZAL) 5 MG tablet TAKE 1 TABLET BY MOUTH EVERY DAY IN THE EVENING 02/08/21   Cannady, Jolene T, NP  losartan (COZAAR) 100 MG tablet  Take 1 tablet (100 mg total) by mouth daily. 02/08/21   Cannady, Henrine Screws T, NP  metoprolol tartrate (LOPRESSOR) 25 MG tablet TAKE 1 TABLET BY MOUTH TWICE A DAY 08/25/21   Wellington Hampshire, MD  potassium chloride (KLOR-CON M) 10 MEQ tablet Take 1 tablet (10 mEq total) by mouth daily. 08/24/21   Marnee Guarneri T, NP  Zoster Vaccine Adjuvanted [facility]) injection Inject 0.5 mLs into the muscle once for 1 dose. Dose # 2 01/20/22 01/20/22  Venita Lick, NP    Physical Exam    Vital Signs:  Tisha Cline does  not have vital signs available for review today.  Given telephonic nature of communication, physical exam is limited. AAOx3. NAD. Normal affect.  Speech and respirations are unlabored.  Accessory Clinical Findings    None  Assessment & Plan    1.  Preoperative Cardiovascular Risk Assessment:  According to the Revised Cardiac Risk Index (RCRI), her Perioperative Risk of Major Cardiac Event is (%): 0.4. Her Functional Capacity in METs is: 5.81 according to the [employer] Activity Status Index (DASI). Therefore, based on ACC/AHA guidelines, patient would be at acceptable risk for the planned procedure without further cardiovascular testing.   Patient takes Eliquis for history of PE, managed by PCP.  Therefore, recommendations on holding Eliquis prior to surgery should come from managing/prescribing provider and not cardiology.  A copy of this note will be routed to requesting surgeon.  Time:   Today, I have spent 5 minutes with the patient with telehealth technology discussing medical history, symptoms, and management plan.     Lenna Sciara, NP  08/27/2021, 11:00 AM

## 2021-08-30 ENCOUNTER — Encounter
Admission: RE | Admit: 2021-08-30 | Discharge: 2021-08-30 | Disposition: A | Discharge status: Home / Self Care | Payer: Medicare Other | Source: Ambulatory Visit | Attending: Surgery | Admitting: Surgery

## 2021-08-30 DIAGNOSIS — Z01812 Encounter for preprocedural laboratory examination: Secondary | ICD-10-CM

## 2021-08-30 DIAGNOSIS — I471 Supraventricular tachycardia, unspecified: Secondary | ICD-10-CM

## 2021-08-30 DIAGNOSIS — I10 Essential (primary) hypertension: Secondary | ICD-10-CM

## 2021-08-30 NOTE — Patient Instructions (Addendum)
Your procedure is scheduled on: 08/31/21 - Tuesday - 0930 am. Report to the Registration Desk on the 1st floor of the [facility]. To find out your arrival time, please call [phone] between 1PM - 3PM on: 08/30/21 - Monday If your arrival time is 6:00 am, do not arrive prior to that time as the [facility] entrance doors do not open until 6:00 am.  REMEMBER: Instructions that are not followed completely may result in serious medical risk, up to and including death; or upon the discretion of your surgeon and anesthesiologist your surgery may need to be rescheduled.  Do not eat food or drink any fluids after midnight the night before surgery.  No gum chewing, lozengers or hard candies.  TAKE THESE MEDICATIONS THE MORNING OF SURGERY WITH A SIP OF WATER:  - metoprolol tartrate  - potassium chloride   -Stop taking Eliquis beginning 08/29/21, resume taking per doctor order.  One week prior to surgery: Stop Anti-inflammatories (NSAIDS) such as Advil, Aleve, Ibuprofen, Motrin, Naproxen, Naprosyn and Aspirin based products such as Excedrin, Goodys Powder, BC Powder.  Stop ANY OVER THE COUNTER supplements until after surgery.  You may however, continue to take Tylenol if needed for pain up until the day of surgery.  No Alcohol for 24 hours before or after surgery.  No Smoking including e-cigarettes for 24 hours prior to surgery.  No chewable tobacco products for at least 6 hours prior to surgery.  No nicotine patches on the day of surgery.  Do not use any "recreational" drugs for at least a week prior to your surgery.  Please be advised that the combination of cocaine and anesthesia may have negative outcomes, up to and including death. If you test positive for cocaine, your surgery will be cancelled.  On the morning of surgery brush your teeth with toothpaste and water, you may rinse your mouth with mouthwash if you wish. Do not swallow any toothpaste or mouthwash.  Do not wear  jewelry, make-up, hairpins, clips or nail polish.  Do not wear lotions, powders, or perfumes.   Do not shave body from the neck down 48 hours prior to surgery just in case you cut yourself which could leave a site for infection.  Also, freshly shaved skin may become irritated if using the CHG soap.  Contact lenses, hearing aids and dentures may not be worn into surgery.  Do not bring valuables to the hospital. [facility] is not responsible for any missing/lost belongings or valuables.   Notify your doctor if there is any change in your medical condition (cold, fever, infection).  Wear comfortable clothing (specific to your surgery type) to the hospital.  After surgery, you can help prevent lung complications by doing breathing exercises.  Take deep breaths and cough every 1-2 hours. Your doctor may order a device called an Incentive Spirometer to help you take deep breaths. When coughing or sneezing, hold a pillow firmly against your incision with both hands. This is called "splinting." Doing this helps protect your incision. It also decreases belly discomfort.  If you are being admitted to the hospital overnight, leave your suitcase in the car. After surgery it may be brought to your room.  If you are being discharged the day of surgery, you will not be allowed to drive home. You will need a responsible adult (18 years or older) to drive you home and stay with you that night.   If you are taking public transportation, you will need to have  a responsible adult (18 years or older) with you. Please confirm with your physician that it is acceptable to use public transportation.   Please call the [facility] at [phone] if you have any questions about these instructions.  Surgery Visitation Policy:  Patients undergoing a surgery or procedure may have two family members or support persons with them as long as the person is not COVID-19 positive or experiencing its  symptoms.   Inpatient Visitation:    Visiting hours are 7 a.m. to 8 p.m. Up to four visitors are allowed at one time in a patient room, including children. The visitors may rotate out with other people during the day. One designated support person (adult) may remain overnight.

## 2021-08-31 ENCOUNTER — Encounter
Admission: RE | Disposition: A | Discharge status: Home / Self Care | Payer: Self-pay | Source: Home / Self Care | Attending: Surgery

## 2021-08-31 ENCOUNTER — Ambulatory Visit: Payer: Medicare Other | Admitting: Certified Registered"

## 2021-08-31 ENCOUNTER — Other Ambulatory Visit: Payer: Self-pay

## 2021-08-31 ENCOUNTER — Encounter: Payer: Self-pay | Admitting: Surgery

## 2021-08-31 ENCOUNTER — Ambulatory Visit
Admission: RE | Admit: 2021-08-31 | Discharge: 2021-08-31 | Disposition: A | Discharge status: Home / Self Care | Payer: Medicare Other | Attending: Surgery | Admitting: Surgery

## 2021-08-31 DIAGNOSIS — R197 Diarrhea, unspecified: Secondary | ICD-10-CM | POA: Insufficient documentation

## 2021-08-31 DIAGNOSIS — K8020 Calculus of gallbladder without cholecystitis without obstruction: Secondary | ICD-10-CM

## 2021-08-31 DIAGNOSIS — Z86718 Personal history of other venous thrombosis and embolism: Secondary | ICD-10-CM | POA: Diagnosis not present

## 2021-08-31 DIAGNOSIS — K8010 Calculus of gallbladder with chronic cholecystitis without obstruction: Secondary | ICD-10-CM | POA: Diagnosis not present

## 2021-08-31 DIAGNOSIS — K8060 Calculus of gallbladder and bile duct with cholecystitis, unspecified, without obstruction: Secondary | ICD-10-CM | POA: Insufficient documentation

## 2021-08-31 DIAGNOSIS — J45909 Unspecified asthma, uncomplicated: Secondary | ICD-10-CM | POA: Diagnosis not present

## 2021-08-31 DIAGNOSIS — Z90710 Acquired absence of both cervix and uterus: Secondary | ICD-10-CM | POA: Diagnosis not present

## 2021-08-31 DIAGNOSIS — I10 Essential (primary) hypertension: Secondary | ICD-10-CM | POA: Diagnosis not present

## 2021-08-31 DIAGNOSIS — K8050 Calculus of bile duct without cholangitis or cholecystitis without obstruction: Secondary | ICD-10-CM | POA: Diagnosis not present

## 2021-08-31 DIAGNOSIS — Z01812 Encounter for preprocedural laboratory examination: Secondary | ICD-10-CM

## 2021-08-31 DIAGNOSIS — E785 Hyperlipidemia, unspecified: Secondary | ICD-10-CM | POA: Diagnosis not present

## 2021-08-31 DIAGNOSIS — I471 Supraventricular tachycardia: Secondary | ICD-10-CM

## 2021-08-31 HISTORY — DX: Personal history of other specified conditions: Z87.898

## 2021-08-31 SURGERY — CHOLECYSTECTOMY, ROBOT-ASSISTED, LAPAROSCOPIC
Anesthesia: General

## 2021-08-31 MED ORDER — BUPIVACAINE-EPINEPHRINE (PF) 0.25% -1:200000 IJ SOLN
INTRAMUSCULAR | Status: DC | PRN
  Administered 2021-08-31: 30 mL

## 2021-08-31 MED ORDER — ROCURONIUM BROMIDE 100 MG/10ML IV SOLN
INTRAVENOUS | Status: DC | PRN
  Administered 2021-08-31: 60 mg via INTRAVENOUS
  Administered 2021-08-31: 10 mg via INTRAVENOUS

## 2021-08-31 MED ORDER — BUPIVACAINE LIPOSOME 1.3 % IJ SUSP
INTRAMUSCULAR | Status: DC | PRN
  Administered 2021-08-31: 20 mL

## 2021-08-31 MED ORDER — FENTANYL CITRATE (PF) 100 MCG/2ML IJ SOLN
INTRAMUSCULAR | Status: AC
  Administered 2021-08-31: 50 ug via INTRAVENOUS
  Filled 2021-08-31: qty 2

## 2021-08-31 MED ORDER — ONDANSETRON HCL 4 MG/2ML IJ SOLN
INTRAMUSCULAR | Status: AC
  Filled 2021-08-31: qty 2

## 2021-08-31 MED ORDER — DEXAMETHASONE SODIUM PHOSPHATE 10 MG/ML IJ SOLN
INTRAMUSCULAR | Status: AC
  Filled 2021-08-31: qty 1

## 2021-08-31 MED ORDER — CHLORHEXIDINE GLUCONATE CLOTH 2 % EX PADS: 6.0000 | MEDICATED_PAD | Freq: Once | CUTANEOUS | Status: DC

## 2021-08-31 MED ORDER — STERILE WATER FOR IRRIGATION IR SOLN
Status: DC | PRN
  Administered 2021-08-31: 1000 mL

## 2021-08-31 MED ORDER — FENTANYL CITRATE (PF) 100 MCG/2ML IJ SOLN
INTRAMUSCULAR | Status: AC
  Filled 2021-08-31: qty 2

## 2021-08-31 MED ORDER — DEXMEDETOMIDINE HCL IN NACL 200 MCG/50ML IV SOLN
INTRAVENOUS | Status: DC | PRN
  Administered 2021-08-31 (×2): 8 ug via INTRAVENOUS

## 2021-08-31 MED ORDER — PROPOFOL 10 MG/ML IV BOLUS
INTRAVENOUS | Status: DC | PRN
  Administered 2021-08-31: 150 mg via INTRAVENOUS

## 2021-08-31 MED ORDER — CHLORHEXIDINE GLUCONATE 0.12 % MT SOLN
OROMUCOSAL | Status: AC
  Administered 2021-08-31: 15 mL via OROMUCOSAL
  Filled 2021-08-31: qty 15

## 2021-08-31 MED ORDER — ACETAMINOPHEN 500 MG PO TABS: 1000.0000 mg | ORAL_TABLET | ORAL | Status: AC

## 2021-08-31 MED ORDER — SUCCINYLCHOLINE CHLORIDE 200 MG/10ML IV SOSY
PREFILLED_SYRINGE | INTRAVENOUS | Status: AC
  Filled 2021-08-31: qty 10

## 2021-08-31 MED ORDER — CHLORHEXIDINE GLUCONATE 0.12 % MT SOLN: 15.0000 mL | Freq: Once | OROMUCOSAL | Status: AC

## 2021-08-31 MED ORDER — OXYCODONE HCL 5 MG/5ML PO SOLN: 5.0000 mg | Freq: Once | ORAL | Status: AC | PRN

## 2021-08-31 MED ORDER — OXYCODONE HCL 5 MG PO TABS
ORAL_TABLET | ORAL | Status: AC
  Filled 2021-08-31: qty 1

## 2021-08-31 MED ORDER — ORAL CARE MOUTH RINSE: 15.0000 mL | Freq: Once | OROMUCOSAL | Status: AC

## 2021-08-31 MED ORDER — GABAPENTIN 300 MG PO CAPS: 300.0000 mg | ORAL_CAPSULE | ORAL | Status: AC

## 2021-08-31 MED ORDER — HYDROCODONE-ACETAMINOPHEN 5-325 MG PO TABS: 1.0000 | ORAL_TABLET | ORAL | 0 refills | Status: DC | PRN

## 2021-08-31 MED ORDER — BUPIVACAINE LIPOSOME 1.3 % IJ SUSP
INTRAMUSCULAR | Status: AC
  Filled 2021-08-31: qty 20

## 2021-08-31 MED ORDER — SUCCINYLCHOLINE CHLORIDE 200 MG/10ML IV SOSY
PREFILLED_SYRINGE | INTRAVENOUS | Status: DC | PRN
  Administered 2021-08-31: 120 mg via INTRAVENOUS

## 2021-08-31 MED ORDER — ACETAMINOPHEN 500 MG PO TABS
ORAL_TABLET | ORAL | Status: AC
  Administered 2021-08-31: 1000 mg via ORAL
  Filled 2021-08-31: qty 2

## 2021-08-31 MED ORDER — LACTATED RINGERS IV SOLN: INTRAVENOUS | Status: DC

## 2021-08-31 MED ORDER — CEFAZOLIN IN SODIUM CHLORIDE 3-0.9 GM/100ML-% IV SOLN
3.0000 g | INTRAVENOUS | Status: AC
  Administered 2021-08-31: 3 g via INTRAVENOUS
  Filled 2021-08-31 (×2): qty 100

## 2021-08-31 MED ORDER — SUGAMMADEX SODIUM 500 MG/5ML IV SOLN
INTRAVENOUS | Status: AC
  Filled 2021-08-31: qty 5

## 2021-08-31 MED ORDER — DEXAMETHASONE SODIUM PHOSPHATE 10 MG/ML IJ SOLN
INTRAMUSCULAR | Status: DC | PRN
  Administered 2021-08-31: 10 mg via INTRAVENOUS

## 2021-08-31 MED ORDER — GLYCOPYRROLATE 0.2 MG/ML IJ SOLN
INTRAMUSCULAR | Status: AC
  Filled 2021-08-31: qty 1

## 2021-08-31 MED ORDER — GABAPENTIN 300 MG PO CAPS
ORAL_CAPSULE | ORAL | Status: AC
  Administered 2021-08-31: 300 mg via ORAL
  Filled 2021-08-31: qty 1

## 2021-08-31 MED ORDER — ONDANSETRON HCL 4 MG/2ML IJ SOLN
INTRAMUSCULAR | Status: DC | PRN
  Administered 2021-08-31 (×2): 4 mg via INTRAVENOUS

## 2021-08-31 MED ORDER — LIDOCAINE HCL (PF) 2 % IJ SOLN
INTRAMUSCULAR | Status: AC
  Filled 2021-08-31: qty 5

## 2021-08-31 MED ORDER — SUGAMMADEX SODIUM 500 MG/5ML IV SOLN
INTRAVENOUS | Status: DC | PRN
  Administered 2021-08-31: 400 mg via INTRAVENOUS

## 2021-08-31 MED ORDER — INDOCYANINE GREEN 25 MG IV SOLR
2.5000 mg | Freq: Once | INTRAVENOUS | Status: AC
  Administered 2021-08-31: 2.5 mg via INTRAVENOUS
  Filled 2021-08-31: qty 1

## 2021-08-31 MED ORDER — GLYCOPYRROLATE 0.2 MG/ML IJ SOLN
INTRAMUSCULAR | Status: DC | PRN
  Administered 2021-08-31: .2 mg via INTRAVENOUS

## 2021-08-31 MED ORDER — FENTANYL CITRATE (PF) 100 MCG/2ML IJ SOLN
INTRAMUSCULAR | Status: DC | PRN
Start: 2021-08-31 — End: 2021-08-31
  Administered 2021-08-31 (×2): 50 ug via INTRAVENOUS

## 2021-08-31 MED ORDER — OXYCODONE HCL 5 MG PO TABS
5.0000 mg | ORAL_TABLET | Freq: Once | ORAL | Status: AC | PRN
  Administered 2021-08-31: 5 mg via ORAL

## 2021-08-31 MED ORDER — ROCURONIUM BROMIDE 10 MG/ML (PF) SYRINGE
PREFILLED_SYRINGE | INTRAVENOUS | Status: AC
  Filled 2021-08-31: qty 10

## 2021-08-31 MED ORDER — FENTANYL CITRATE (PF) 100 MCG/2ML IJ SOLN
25.0000 ug | INTRAMUSCULAR | Status: DC | PRN
  Administered 2021-08-31: 50 ug via INTRAVENOUS

## 2021-08-31 MED ORDER — LIDOCAINE HCL (CARDIAC) PF 100 MG/5ML IV SOSY
PREFILLED_SYRINGE | INTRAVENOUS | Status: DC | PRN
  Administered 2021-08-31: 100 mg via INTRAVENOUS

## 2021-08-31 MED ORDER — BUPIVACAINE-EPINEPHRINE (PF) 0.25% -1:200000 IJ SOLN
INTRAMUSCULAR | Status: AC
  Filled 2021-08-31: qty 30

## 2021-08-31 SURGICAL SUPPLY — 50 items
ADH SKN CLS APL DERMABOND .7 (GAUZE/BANDAGES/DRESSINGS) ×1
CANNULA REDUC XI 12-8 STAPL (CANNULA) ×1
CANNULA REDUCER 12-8 DVNC XI (CANNULA) ×1 IMPLANT
CATH REDDICK CHOLANGI 4FR 50CM (CATHETERS) IMPLANT
CLIP LIGATING HEMO O LOK GREEN (MISCELLANEOUS) ×2 IMPLANT
DERMABOND ADVANCED (GAUZE/BANDAGES/DRESSINGS) ×1
DERMABOND ADVANCED .7 DNX12 (GAUZE/BANDAGES/DRESSINGS) ×1 IMPLANT
DRAPE ARM DVNC X/XI (DISPOSABLE) ×4 IMPLANT
DRAPE COLUMN DVNC XI (DISPOSABLE) ×1 IMPLANT
DRAPE DA VINCI XI ARM (DISPOSABLE) ×4
DRAPE DA VINCI XI COLUMN (DISPOSABLE) ×1
ELECT CAUTERY BLADE 6.4 (BLADE) ×2 IMPLANT
ELECT REM PT RETURN 9FT ADLT (ELECTROSURGICAL) ×2
ELECTRODE REM PT RTRN 9FT ADLT (ELECTROSURGICAL) ×1 IMPLANT
GLOVE BIO SURGEON STRL SZ7 (GLOVE) ×10 IMPLANT
GOWN STRL REUS W/ TWL LRG LVL3 (GOWN DISPOSABLE) ×4 IMPLANT
GOWN STRL REUS W/TWL LRG LVL3 (GOWN DISPOSABLE) ×8
IRRIGATION STRYKERFLOW (MISCELLANEOUS) IMPLANT
IRRIGATOR STRYKERFLOW (MISCELLANEOUS) ×2
IV CATH ANGIO 12GX3 LT BLUE (NEEDLE) IMPLANT
KIT PINK PAD W/HEAD ARE REST (MISCELLANEOUS) ×2
KIT PINK PAD W/HEAD ARM REST (MISCELLANEOUS) ×1 IMPLANT
LABEL OR SOLS (LABEL) ×2 IMPLANT
MANIFOLD NEPTUNE II (INSTRUMENTS) ×2 IMPLANT
NEEDLE HYPO 22GX1.5 SAFETY (NEEDLE) ×2 IMPLANT
NS IRRIG 500ML POUR BTL (IV SOLUTION) ×2 IMPLANT
OBTURATOR OPTICAL STANDARD 8MM (TROCAR) ×1
OBTURATOR OPTICAL STND 8 DVNC (TROCAR) ×1
OBTURATOR OPTICALSTD 8 DVNC (TROCAR) ×1 IMPLANT
PACK LAP CHOLECYSTECTOMY (MISCELLANEOUS) ×2 IMPLANT
SCISSORS METZENBAUM CVD 33 (INSTRUMENTS) ×1 IMPLANT
SEAL CANN UNIV 5-8 DVNC XI (MISCELLANEOUS) ×3 IMPLANT
SEAL XI 5MM-8MM UNIVERSAL (MISCELLANEOUS) ×3
SET TUBE SMOKE EVAC HIGH FLOW (TUBING) ×2 IMPLANT
SOLUTION ELECTROLUBE (MISCELLANEOUS) ×2 IMPLANT
SPIKE FLUID TRANSFER (MISCELLANEOUS) ×2 IMPLANT
SPONGE T-LAP 18X18 ~~LOC~~+RFID (SPONGE) ×1 IMPLANT
SPONGE T-LAP 4X18 ~~LOC~~+RFID (SPONGE) ×1 IMPLANT
STAPLER CANNULA SEAL DVNC XI (STAPLE) ×1 IMPLANT
STAPLER CANNULA SEAL XI (STAPLE) ×1
STOPCOCK 3 WAY MALE LL (IV SETS)
STOPCOCK 3WAY MALE LL (IV SETS) IMPLANT
SUT MNCRL AB 4-0 PS2 18 (SUTURE) ×2 IMPLANT
SUT VICRYL 0 AB UR-6 (SUTURE) ×4 IMPLANT
SYR 20ML LL LF (SYRINGE) ×2 IMPLANT
SYR 30ML LL (SYRINGE) ×2 IMPLANT
SYS BAG RETRIEVAL 10MM (BASKET) ×2
SYSTEM BAG RETRIEVAL 10MM (BASKET) ×1 IMPLANT
WATER STERILE IRR 3000ML UROMA (IV SOLUTION) ×1 IMPLANT
WATER STERILE IRR 500ML POUR (IV SOLUTION) ×2 IMPLANT

## 2021-08-31 NOTE — Anesthesia Procedure Notes (Addendum)
Procedure Name: Intubation Date/Time: 08/31/2021 12:32 PM  Performed by: Kelton Pillar, CRNAPre-anesthesia Checklist: Patient identified, Emergency Drugs available, Suction available and Patient being monitored Patient Re-evaluated:Patient Re-evaluated prior to induction Oxygen Delivery Method: Circle system utilized Preoxygenation: Pre-oxygenation with 100% oxygen Induction Type: IV induction and Rapid sequence Laryngoscope Size: McGraph and 3 Grade View: Grade I Tube type: Oral Tube size: 7.0 mm Number of attempts: 1 Airway Equipment and Method: Stylet and Oral airway Placement Confirmation: ETT inserted through vocal cords under direct vision, positive ETCO2, breath sounds checked- equal and bilateral and CO2 detector Secured at: 21 cm Tube secured with: Tape Dental Injury: Teeth and Oropharynx as per pre-operative assessment

## 2021-08-31 NOTE — Anesthesia Preprocedure Evaluation (Addendum)
Anesthesia Evaluation  Patient identified by MRN, date of birth, ID band Patient awake    Reviewed: Allergy & Precautions, NPO status , Patient's Chart, lab work & pertinent test results  History of Anesthesia Complications (+) PROLONGED EMERGENCE and history of anesthetic complications  Airway Mallampati: III  TM Distance: <3 FB Neck ROM: full    Dental  (+) Chipped, Poor Dentition   Pulmonary asthma ,    Pulmonary exam normal        Cardiovascular Exercise Tolerance: Good hypertension, (-) angina(-) Past MI and (-) DOE Normal cardiovascular exam     Neuro/Psych negative neurological ROS  negative psych ROS   GI/Hepatic negative GI ROS, Neg liver ROS, neg GERD  ,  Endo/Other  negative endocrine ROS  Renal/GU      Musculoskeletal   Abdominal   Peds  Hematology negative hematology ROS (+)   Anesthesia Other Findings Past Medical History: No date: Arthritis No date: Hyperlipidemia No date: Hypertension No date: Tumor     Comment:  breast and pelvic No date: Vertigo  Past Surgical History: 2003: ABDOMINAL HYSTERECTOMY No date: BREAST SURGERY 10/10/2019: PULMONARY THROMBECTOMY; N/A     Comment:  Procedure: PULMONARY THROMBECTOMY;  Surgeon: Algernon Huxley, MD;  Location: [facility];  Service:               Cardiovascular;  Laterality: N/A;  BMI    Body Mass Index: 52.26 kg/m      Reproductive/Obstetrics negative OB ROS                            Anesthesia Physical Anesthesia Plan  ASA: 3  Anesthesia Plan: General ETT   Post-op Pain Management:    Induction: Intravenous  PONV Risk Score and Plan: Ondansetron, Dexamethasone, Midazolam and Treatment may vary due to age or medical condition  Airway Management Planned: Oral ETT  Additional Equipment:   Intra-op Plan:   Post-operative Plan: Extubation in OR  Informed Consent: I have reviewed the  patients History and Physical, chart, labs and discussed the procedure including the risks, benefits and alternatives for the proposed anesthesia with the patient or authorized representative who has indicated his/her understanding and acceptance.     Dental Advisory Given  Plan Discussed with: Anesthesiologist, CRNA and Surgeon  Anesthesia Plan Comments: (Patient consented for risks of anesthesia including but not limited to:  - adverse reactions to medications - damage to eyes, teeth, lips or other oral mucosa - nerve damage due to positioning  - sore throat or hoarseness - Damage to heart, brain, nerves, lungs, other parts of body or loss of life  Patient voiced understanding.)        Anesthesia Quick Evaluation

## 2021-08-31 NOTE — Transfer of Care (Signed)
Immediate Anesthesia Transfer of Care Note  Patient: Jasmine Webb  Procedure(s) Performed: XI ROBOTIC ASSISTED LAPAROSCOPIC CHOLECYSTECTOMY INDOCYANINE GREEN FLUORESCENCE IMAGING (ICG)  Patient Location: PACU  Anesthesia Type:General  Level of Consciousness: awake, drowsy and patient cooperative  Airway & Oxygen Therapy: Patient Spontanous Breathing  Post-op Assessment: Report given to RN and Post -op Vital signs reviewed and stable  Post vital signs: Reviewed and stable  Last Vitals:  Vitals Value Taken Time  BP 149/72 08/31/21 1406  Temp    Pulse 93 08/31/21 1413  Resp 17 08/31/21 1413  SpO2 94 % 08/31/21 1413  Vitals shown include unvalidated device data.  Last Pain:  Vitals:   08/31/21 0951  TempSrc: Oral  PainSc: 5          Complications: No notable events documented.

## 2021-08-31 NOTE — Interval H&P Note (Signed)
History and Physical Interval Note:  08/31/2021 11:41 AM  Jasmine Webb  has presented today for surgery, with the diagnosis of Biliary colie.  The various methods of treatment have been discussed with the patient and family. After consideration of risks, benefits and other options for treatment, the patient has consented to  Procedure(s): XI ROBOTIC ASSISTED LAPAROSCOPIC CHOLECYSTECTOMY (N/A) [name] (ICG) (N/A) as a surgical intervention.  The patient's history has been reviewed, patient examined, no change in status, stable for surgery.  I have reviewed the patient's chart and labs.  Questions were answered to the patient's satisfaction.     Walker

## 2021-08-31 NOTE — Op Note (Signed)
Robotic assisted laparoscopic Cholecystectomy BMI 52.3  Pre-operative Diagnosis: Chronic cholecystitis  Post-operative Diagnosis: same  Procedure:  Robotic assisted laparoscopic Cholecystectomy  Surgeon: Caroleen Hamman, MD FACS  Anesthesia: Gen. with endotracheal tube  Findings: Chronic  Cholecystitis  Significant adhesion from prior laparotomy No evidence of bowel injuries Hepatic steatosis w large liver Difficult case due to body habitus  Estimated Blood Loss: 10cc       Specimens: Gallbladder           Complications: none   Procedure Details  The patient was seen again in the Holding Room. The benefits, complications, treatment options, and expected outcomes were discussed with the patient. The risks of bleeding, infection, recurrence of symptoms, failure to resolve symptoms, bile duct damage, bile duct leak, retained common bile duct stone, bowel injury, any of which could require further surgery and/or ERCP, stent, or papillotomy were reviewed with the patient. The likelihood of improving the patient's symptoms with return to their baseline status is good.  The patient and/or family concurred with the proposed plan, giving informed consent.  The patient was taken to Operating Room, identified  and the procedure verified as Laparoscopic Cholecystectomy.  A Time Out was held and the above information confirmed.  Prior to the induction of general anesthesia, antibiotic prophylaxis was administered. VTE prophylaxis was in place. General endotracheal anesthesia was then administered and tolerated well. After the induction, the abdomen was prepped with Chloraprep and draped in the sterile fashion. The patient was positioned in the supine position.  Cut down technique was used to enter the abdominal cavity and a Hasson trochar was placed after two vicryl stitches were anchored to the fascia. Pneumoperitoneum was then created with CO2 and tolerated well without any adverse changes in the  patient's vital signs.  Three 8-mm ports were placed under direct vision. All skin incisions  were infiltrated with a local anesthetic agent before making the incision and placing the trocars.  There were dense adhesion that we addressed laparoscopically with scissors, we were able to free up some bowel and omentum that was attached to the abdominal wall. Once LOA was done we had a good window for dissection  The patient was positioned  in reverse Trendelenburg, robot was brought to the surgical field and docked in the standard fashion.  We made sure all the instrumentation was kept indirect view at all times and that there were no collision between the arms. I scrubbed out and went to the console.  The gallbladder was identified, the fundus grasped and retracted cephalad. Adhesions were lysed bluntly. The infundibulum was grasped and retracted laterally, exposing the peritoneum overlying the triangle of Calot. This was then divided and exposed in a blunt fashion. An extended critical view of the cystic duct and cystic artery was obtained.  The cystic duct was clearly identified and bluntly dissected.   Artery and duct were double clipped and divided. Using ICG cholangiography we visualize the cystic duct and CBD no evidence of bile injuries. The gallbladder was taken from the gallbladder fossa in a retrograde fashion with the electrocautery.  Hemostasis was achieved with the electrocautery. nspection of the right upper quadrant was performed. No bleeding, bile duct injury or leak, or bowel injury was noted. Robotic instruments and robotic arms were undocked in the standard fashion.  I scrubbed back in.  The gallbladder was removed and placed in an Endocatch bag.   Pneumoperitoneum was released.  The periumbilical port site was closed with interrumpted 0 Vicryl sutures. 4-0  subcuticular Monocryl was used to close the skin. Dermabond was  applied.  The patient was then extubated and brought to the  recovery room in stable condition. Sponge, lap, and needle counts were correct at closure and at the conclusion of the case.               Caroleen Hamman, MD, FACS

## 2021-08-31 NOTE — Anesthesia Postprocedure Evaluation (Signed)
Anesthesia Post Note  Patient: Jasmine Webb  Procedure(s) Performed: XI ROBOTIC ASSISTED LAPAROSCOPIC CHOLECYSTECTOMY INDOCYANINE GREEN FLUORESCENCE IMAGING (ICG)  Patient location during evaluation: PACU Anesthesia Type: General Level of consciousness: awake and alert Pain management: pain level controlled Vital Signs Assessment: post-procedure vital signs reviewed and stable Respiratory status: spontaneous breathing, nonlabored ventilation, respiratory function stable and patient connected to nasal cannula oxygen Cardiovascular status: blood pressure returned to baseline and stable Postop Assessment: no apparent nausea or vomiting Anesthetic complications: no   No notable events documented.   Last Vitals:  Vitals:   08/31/21 1445 08/31/21 1500  BP: 125/69 (!) 134/94  Pulse: 71 (!) 102  Resp: 20 16  Temp: (!) 36.3 C (!) 36.1 C  SpO2: 97% 96%    Last Pain:  Vitals:   08/31/21 1500  TempSrc: Temporal  PainSc: 3                  Precious Haws Linden Mikes

## 2021-08-31 NOTE — Discharge Instructions (Addendum)
Laparoscopic Cholecystectomy, Care After   These instructions give you information on caring for yourself after your procedure. Your doctor may also give you more specific instructions. Call your doctor if you have any problems or questions after your procedure.  HOME CARE  Change your bandages (dressings) as told by your doctor.  Keep the wound dry and clean. Wash the wound gently with soap and water. Pat the wound dry with a clean towel.  Do not take baths, swim, or use hot tubs for 2 weeks, or as told by your doctor.  Only take medicine as told by your doctor.  Eat a normal diet as told by your doctor.  Do not lift anything heavier than 10 pounds (4.5 kg) until your doctor says it is okay.  Do not play contact sports for 1 week, or as told by your doctor. GET HELP IF:  Your wound is red, puffy (swollen), or painful.  You have yellowish-white fluid (pus) coming from the wound.  You have fluid draining from the wound for more than 1 day.  You have a bad smell coming from the wound.  Your wound breaks open. GET HELP RIGHT AWAY IF:  You have trouble breathing.  You have chest pain.  You have a fever >101  You have pain in the shoulders (shoulder strap areas) that is getting worse.  You feel dizzy or pass out (faint).  You have severe belly (abdominal) pain.  You feel sick to your stomach (nauseous) or throw up (vomit) for more than 1 day.     : Laparoscopic Cholecystectomy, Care After    These instructions give you information on caring for yourself after your procedure. Your doctor may also give you more specific instructions. Call your doctor if you have any problems or questions after your procedure.  HOME CARE  Change your bandages (dressings) as told by your doctor.  Keep the wound dry and clean. Wash the wound gently with soap and water. Pat the wound dry with a clean towel.  Do not take baths, swim, or use hot tubs for 2 weeks, or as told by your doctor.  Only take medicine  as told by your doctor.  Eat a normal diet as told by your doctor.  Do not lift anything heavier than 10 pounds (4.5 kg) until your doctor says it is okay.  Do not play contact sports for 1 week, or as told by your doctor. GET HELP IF:  Your wound is red, puffy (swollen), or painful.  You have yellowish-white fluid (pus) coming from the wound.  You have fluid draining from the wound for more than 1 day.  You have a bad smell coming from the wound.  Your wound breaks open. GET HELP RIGHT AWAY IF:  You have trouble breathing.  You have chest pain.  You have a fever >101  You have pain in the shoulders (shoulder strap areas) that is getting worse.  You feel dizzy or pass out (faint).  You have severe belly (abdominal) pain.  You feel sick to your stomach (nauseous) or throw up (vomit) for more than 1 day.  AMBULATORY SURGERY  DISCHARGE INSTRUCTIONS   The drugs that you were given will stay in your system until tomorrow so for the next 24 hours you should not:  Drive an automobile Make any legal decisions Drink any alcoholic beverage   You may resume regular meals tomorrow.  Today it is better to start with liquids and gradually work up to solid  foods.  You may eat anything you prefer, but it is better to start with liquids, then soup and crackers, and gradually work up to solid foods.   Please notify your doctor immediately if you have any unusual bleeding, trouble breathing, redness and pain at the surgery site, drainage, fever, or pain not relieved by medication.

## 2021-09-02 ENCOUNTER — Other Ambulatory Visit: Payer: Medicare Other

## 2021-09-02 LAB — SURGICAL PATHOLOGY

## 2021-09-08 ENCOUNTER — Ambulatory Visit: Payer: Medicare Other | Admitting: Nurse Practitioner

## 2021-09-14 ENCOUNTER — Ambulatory Visit (INDEPENDENT_AMBULATORY_CARE_PROVIDER_SITE_OTHER): Payer: Medicare Other | Admitting: Physician Assistant

## 2021-09-14 ENCOUNTER — Encounter: Payer: Self-pay | Admitting: Physician Assistant

## 2021-09-14 VITALS — BP 122/68 | HR 69 | Temp 97.9°F | Wt 291.2 lb

## 2021-09-14 DIAGNOSIS — K8010 Calculus of gallbladder with chronic cholecystitis without obstruction: Secondary | ICD-10-CM

## 2021-09-14 DIAGNOSIS — Z09 Encounter for follow-up examination after completed treatment for conditions other than malignant neoplasm: Secondary | ICD-10-CM

## 2021-09-14 DIAGNOSIS — K8020 Calculus of gallbladder without cholecystitis without obstruction: Secondary | ICD-10-CM

## 2021-09-14 NOTE — Progress Notes (Signed)
[facility] POST-OP OFFICE VISIT  09/14/2021  HPI: Jasmine Webb is a 66 y.o. female 14 days s/p robotic assisted laparoscopic cholecystectomy for chronic cholecystitis with Dr Dahlia Byes  She is doing well Minimal abdominal soreness; never needed the oxycodone No fever, chills, nausea, emesis Intermittent diarrhea; working on her diet No issues with incisions No other complaints   Vital signs: BP 122/68   Pulse 69   Temp 97.9 F (36.6 C) (Oral)   Wt 291 lb 3.2 oz (132.1 kg)   LMP  (LMP Unknown)   SpO2 96%   BMI 51.58 kg/m    Physical Exam: Constitutional: Well appearing female, NAD Abdomen: Soft, non-tender, non-distended, no rebound/guarding Skin: Laparoscopic incisions are healing well, no erythema or drainage   Assessment/Plan: This is a 66 y.o. female 14 days s/p robotic assisted laparoscopic cholecystectomy for chronic cholecystitis with Dr Dahlia Byes   - Pain control prn  - Reviewed wound care recommendation  - Reviewed lifting restrictions; 4 weeks total  - Reviewed surgical pathology; [name]  - She can follow up on as needed basis; She understands to call with questions/concerns  -- Edison Simon, PA-C [facility] 09/14/2021, 2:01 PM M-F: 7am - 4pm

## 2021-09-14 NOTE — Patient Instructions (Signed)
If you have any concerns or questions, please feel free to call our office.  ? ?Gallbladder Eating Plan ? ?High blood cholesterol, obesity, a sedentary lifestyle, an unhealthy diet, and diabetes are risk factors for developing gallstones. ?If you have a gallbladder condition, you may have trouble digesting fats and tolerating high fat intake. Eating a low-fat diet can help reduce your symptoms and may be helpful before and after having surgery to remove your gallbladder (cholecystectomy). Your health care provider may recommend that you work with a dietitian to help you reduce the amount of fat in your diet. ?What are tips for following this plan? ?General guidelines ?Limit your fat intake to less than 30% of your total daily calories. If you eat around 1,800 calories each day, this means eating less than 60 grams (g) of fat per day. ?Fat is an important part of a healthy diet. Eating a low-fat diet can make it hard to maintain a healthy body weight. Ask your dietitian how much fat, calories, and other nutrients you need each day. ?Eat small, frequent meals throughout the day instead of three large meals. ?Drink at least 8-10 cups (1.9-2.4 L) of fluid a day. Drink enough fluid to keep your urine pale yellow. ?If you drink alcohol: ?Limit how much you have to: ?0-1 drink a day for women who are not pregnant. ?0-2 drinks a day for men. ?Know how much alcohol is in a drink. In the [place] one drink equals one 12 oz bottle of beer (355 mL), one 5 oz glass of wine (148 mL), or one 1? oz glass of hard liquor (44 mL). ?Reading food labels ? ?Check nutrition facts on food labels for the amount of fat per serving. Choose foods with less than 3 grams of fat per serving. ?Shopping ?Choose nonfat and low-fat healthy foods. Look for the words "nonfat," "low-fat," or "fat-free." ?Avoid buying processed or prepackaged foods. ?Cooking ?Cook using low-fat methods, such as baking, broiling, grilling, or boiling. ?Cook with small  amounts of healthy fats, such as olive oil, grapeseed oil, canola oil, avocado oil, or sunflower oil. ?What foods are recommended? ? ?All fresh, frozen, or canned fruits and vegetables. ?Whole grains. ?Low-fat or nonfat (skim) milk and yogurt. ?Lean meat, skinless poultry, fish, eggs, and beans. ?Low-fat protein supplement powders or drinks. ?Spices and herbs. ?The items listed above may not be a complete list of foods and beverages you can eat and drink. Contact a dietitian for more information. ?What foods are not recommended? ?High-fat foods. These include baked goods, fast food, fatty cuts of meat, ice cream, french toast, sweet rolls, pizza, cheese bread, foods covered with butter, creamy sauces, or cheese. ?Fried foods. These include french fries, tempura, battered fish, breaded chicken, fried breads, and sweets. ?Foods that cause bloating and gas. ?The items listed above may not be a complete list of foods that you should avoid. Contact a dietitian for more information. ?Summary ?A low-fat diet can be helpful if you have a gallbladder condition, or before and after gallbladder surgery. ?Limit your fat intake to less than 30% of your total daily calories. This is about 60 g of fat if you eat 1,800 calories each day. ?Eat small, frequent meals throughout the day instead of three large meals. ?This information is not intended to replace advice given to you by your health care provider. Make sure you discuss any questions you have with your health care provider. ?Document Revised: 01/01/2021 Document Reviewed: 01/01/2021 ?Elsevier Patient Education ? 2023 Elsevier 

## 2021-09-16 ENCOUNTER — Other Ambulatory Visit: Payer: Medicare Other

## 2021-09-16 DIAGNOSIS — E876 Hypokalemia: Secondary | ICD-10-CM

## 2021-09-17 LAB — BASIC METABOLIC PANEL
BUN/Creatinine Ratio: 19 (ref 12–28)
BUN: 13 mg/dL (ref 8–27)
CO2: 26 mmol/L (ref 20–29)
Calcium: 8.9 mg/dL (ref 8.7–10.3)
Chloride: 102 mmol/L (ref 96–106)
Creatinine, Ser: 0.67 mg/dL (ref 0.57–1.00)
Glucose: 101 mg/dL — ABNORMAL HIGH (ref 70–99)
Potassium: 3.9 mmol/L (ref 3.5–5.2)
Sodium: 143 mmol/L (ref 134–144)
eGFR: 96 mL/min/{1.73_m2} (ref >59)

## 2021-09-17 NOTE — Progress Notes (Signed)
Good morning, please let Jasmine Webb know her labs have returned with improvement in potassium this check.  Continue all current medications and diet focus at home.  No changes needed at this time.  I hope you are recovering from surgery well and starting to feel better.:)  Have a great day!!

## 2021-09-19 NOTE — Assessment & Plan Note (Signed)
Performed 08/31/21, overall healing well.

## 2021-09-19 NOTE — Patient Instructions (Signed)
Cholecystostomy Cholecystostomy is a procedure to drain fluid from the gallbladder using a flexible drainage tube (catheter). The gallbladder is a pear-shaped organ that lies beneath the liver on the right side of the body. The gallbladder stores a fluid that helps the body digest fats (bile). You may need this procedure: If your gallbladder is irritated and swollen (cholecystitis). This condition may be caused by stones or lumps that form in the gallbladder (gallstones). Gallstones can block the tube (duct) that carries bile out of your gallbladder. To control a gallbladder infection if you cannot have gallbladder surgery. Tell a health care provider about: Any allergies you have. All medicines you are taking, including vitamins, herbs, eye drops, creams, and over-the-counter medicines. Any problems you or family members have had with anesthetic medicines. Any bleeding problems you have. Any surgeries you have had. Any medical conditions you have. Whether you are pregnant or may be pregnant. What are the risks? Generally, this is a safe procedure. However, problems may occur, including: The catheter moving out of place or becoming clogged. Infection at the incision site or inside the abdomen (peritonitis). Internal bleeding. Leakage of bile from the gallbladder. Damage to other structures or organs. Low blood pressure and slowed heart rate. Allergic reactions to medicines or dyes. What happens before the procedure? When to stop eating and drinking Follow instructions from your health care provider about what you may eat and drink before your procedure. These may include: 8 hours before your procedure Stop eating most foods. Do not eat meat, fried foods, or fatty foods. Eat only light foods, such as toast or crackers. All liquids are okay except energy drinks and alcohol. 6 hours before your procedure Stop eating. Drink only clear liquids, such as water, clear fruit juice, black coffee,  plain tea, and sports drinks. Do not drink energy drinks or alcohol. 2 hours before your procedure Stop drinking all liquids. You may be allowed to take medicines with small sips of water. If you do not follow your health care provider's instructions, your procedure may be delayed or canceled. Medicines Ask your health care provider about: Changing or stopping your regular medicines. This is especially important if you are taking diabetes medicines or blood thinners. Taking medicines such as aspirin and ibuprofen. These medicines can thin your blood. Do not take these medicines unless your health care provider tells you to take them. Taking over-the-counter medicines, vitamins, herbs, and supplements. General instructions You may have an exam or testing, including: Imaging studies of your abdomen and gallbladder. Blood tests. Do not use any products that contain nicotine or tobacco. These products include cigarettes, chewing tobacco, and vaping devices, such as e-cigarettes. If you need help quitting, ask your health care provider. If you will be going home right after the procedure, plan to have a responsible adult: Take you home from the hospital or clinic. You will not be allowed to drive. Care for you for the time you are told. Ask your health care provider: How your surgery site will be marked. What steps will be taken to help prevent infection. These steps may include: Removing hair at the surgery site. Washing skin with a germ-killing soap. Taking antibiotic medicine. What happens during the procedure? An IV will be inserted into one of your veins. You will be given one or more of the following: A medicine to help you relax (sedative). A medicine to numb the area (local anesthetic). A medicine to make you fall asleep (general anesthetic). A small incision will   be made in your abdomen, above your gallbladder. A long needle or a wide puncturing tool (trocar) will be put through  the incision. Your health care provider will use an imaging study (ultrasound or CT scan) to guide the needle or trocar into your gallbladder. After the needle or trocar is in your gallbladder, a small amount of dye may be injected. An X-ray may be taken to make sure that the needle is in the correct place. A catheter will be placed into your gallbladder. Your health care provider may remove any gallstones, if they are present. The needle or trocar will be removed. The catheter will be secured to your skin with stitches (sutures). The catheter will be connected to a drainage bag. Bile will drain from the gallbladder into the bag. Some bile may be sent to the lab to be tested. A bandage (dressing) will be placed over the incision site where the catheter was placed. The procedure may vary among health care providers and hospitals. What happens after the procedure? Your blood pressure, heart rate, breathing rate, and blood oxygen level will be monitored until you leave the hospital or clinic. Dye may be injected through your catheter to check the catheter and your gallbladder. Your gallbladder may be flushed out (irrigated) through the catheter. Your catheter and drainage bag may need to stay in place for several weeks or as told by your health care provider. You will be given IV fluids. Summary Cholecystostomy is a procedure to drain bile from the gallbladder using a flexible drainage tube (catheter). Generally, this is a safe procedure. However, problems may occur, including bleeding, infection, clogging or dislodging of the catheter, damage to other structures or organs, or low blood pressure and slowed heart rate. Follow instructions before the procedure. You will be told when to stop all food and drink, whether to change or stop any medicines, and what tests need to be done. After the procedure, you will be monitored in the hospital or clinic, and you may have a catheter and drainage bag when  you go home. This information is not intended to replace advice given to you by your health care provider. Make sure you discuss any questions you have with your health care provider. Document Revised: 07/21/2020 Document Reviewed: 07/21/2020 Elsevier Patient Education  2023 Elsevier Inc.  

## 2021-09-23 ENCOUNTER — Encounter: Payer: Self-pay | Admitting: Nurse Practitioner

## 2021-09-23 ENCOUNTER — Ambulatory Visit (INDEPENDENT_AMBULATORY_CARE_PROVIDER_SITE_OTHER): Payer: Medicare Other | Admitting: Nurse Practitioner

## 2021-09-23 DIAGNOSIS — Z9049 Acquired absence of other specified parts of digestive tract: Secondary | ICD-10-CM | POA: Diagnosis not present

## 2021-09-23 DIAGNOSIS — K8020 Calculus of gallbladder without cholecystitis without obstruction: Secondary | ICD-10-CM

## 2021-09-23 DIAGNOSIS — Z6841 Body Mass Index (BMI) 40.0 and over, adult: Secondary | ICD-10-CM

## 2021-09-23 NOTE — Assessment & Plan Note (Signed)
Recommended eating smaller high protein, low fat meals more frequently and exercising 30 mins a day 5 times a week with a goal of 10-15lb weight loss in the next 3 months. Patient voiced their understanding and motivation to adhere to these recommendations.  

## 2021-09-23 NOTE — Progress Notes (Signed)
 BP 117/76   Pulse 99   Temp 97.7 F (36.5 C) (Oral)   Ht 5' 3" (1.6 m)   Wt 292 lb 9.6 oz (132.7 kg)   LMP  (LMP Unknown)   SpO2 95%   BMI 51.83 kg/m    Subjective:    Patient ID: Jasmine Webb, female    DOB: 09/06/1955, 65 y.o.   MRN: [id number]  HPI: Jasmine Webb is a 65 y.o. female  Chief Complaint  Patient presents with   RUQ Pain    Patient says she feeling better after surgery. Patient says she is still having bruising and want to discuss possible changes with her prescription.    ABDOMINAL PAIN  Follow-up today for RUQ pain, had robotic cholecystectomy on 08/31/21.  Overall she reports much improvement in pain -- no further issues.  She reports she still is bruising and has some fatigue post surgery -- wanted to know if can come off anticoagulant, reviewed with her cardiology reports this as lifelong.   Duration:weeks Fever: no Nausea: no Vomiting: no Weight loss: no Decreased appetite: no Diarrhea: yes occasional Constipation: no Blood in stool: no Heartburn: no Jaundice: no Rash: no Dysuria/urinary frequency: no Hematuria: no History of sexually transmitted disease: no Recurrent NSAID use: no   Relevant past medical, surgical, family and social history reviewed and updated as indicated. Interim medical history since our last visit reviewed. Allergies and medications reviewed and updated.  Review of Systems  Constitutional: Negative.   Respiratory: Negative.    Cardiovascular: Negative.   Gastrointestinal:  Positive for diarrhea. Negative for abdominal distention, abdominal pain, constipation, nausea and vomiting.  Neurological: Negative.   Psychiatric/Behavioral: Negative.      Per HPI unless specifically indicated above     Objective:    BP 117/76   Pulse 99   Temp 97.7 F (36.5 C) (Oral)   Ht 5' 3" (1.6 m)   Wt 292 lb 9.6 oz (132.7 kg)   LMP  (LMP Unknown)   SpO2 95%   BMI 51.83 kg/m   Wt Readings from Last 3 Encounters:  09/23/21 292 lb  9.6 oz (132.7 kg)  09/14/21 291 lb 3.2 oz (132.1 kg)  08/31/21 295 lb (133.8 kg)    Physical Exam Vitals and nursing note reviewed.  Constitutional:      General: She is awake. She is not in acute distress.    Appearance: She is well-developed and well-groomed. She is obese. She is not ill-appearing or toxic-appearing.  HENT:     Head: Normocephalic.     Right Ear: Hearing normal.     Left Ear: Hearing normal.  Eyes:     General: Lids are normal.        Right eye: No discharge.        Left eye: No discharge.     Conjunctiva/sclera: Conjunctivae normal.     Pupils: Pupils are equal, round, and reactive to light.  Neck:     Thyroid: No thyromegaly.     Vascular: No carotid bruit.  Cardiovascular:     Rate and Rhythm: Normal rate and regular rhythm.     Heart sounds: Normal heart sounds. No murmur heard.    No gallop.  Pulmonary:     Effort: Pulmonary effort is normal.     Breath sounds: Normal breath sounds.  Abdominal:     General: Bowel sounds are normal. There is no distension.     Palpations: Abdomen is soft. There is no hepatomegaly.       Tenderness: There is no abdominal tenderness. There is no right CVA tenderness or left CVA tenderness.       Comments: 4 small incision lines to abdominal are with crusting -- healing well.  Musculoskeletal:     Cervical back: Normal range of motion and neck supple.     Right lower leg: 2+ Edema present.     Left lower leg: 2+ Edema present.  Lymphadenopathy:     Cervical: No cervical adenopathy.  Skin:    General: Skin is warm and dry.  Neurological:     Mental Status: She is alert and oriented to person, place, and time.  Psychiatric:        Attention and Perception: Attention normal.        Mood and Affect: Mood normal.        Speech: Speech normal.        Behavior: Behavior normal. Behavior is cooperative.        Thought Content: Thought content normal.    Results for orders placed or performed in visit on 72/90/21  Basic  Metabolic Panel (BMET)  Result Value Ref Range   Glucose 101 (H) 70 - 99 mg/dL   BUN 13 8 - 27 mg/dL   Creatinine, Ser 0.67 0.57 - 1.00 mg/dL   eGFR 96 >59 mL/min/1.73   BUN/Creatinine Ratio 19 12 - 28   Sodium 143 134 - 144 mmol/L   Potassium 3.9 3.5 - 5.2 mmol/L   Chloride 102 96 - 106 mmol/L   CO2 26 20 - 29 mmol/L   Calcium 8.9 8.7 - 10.3 mg/dL      Assessment & Plan:   Problem List Items Addressed This Visit       Digestive   Cholelithiasis    Symptoms all improved after surgery.  No further concerns.        Other   BMI 50.0-59.9, adult (HCC)    Recommended eating smaller high protein, low fat meals more frequently and exercising 30 mins a day 5 times a week with a goal of 10-15lb weight loss in the next 3 months. Patient voiced their understanding and motivation to adhere to these recommendations.       Morbid obesity due to excess calories ([place]) - Primary    BMI 51.83.  Recommended eating smaller high protein, low fat meals more frequently and exercising 30 mins a day 5 times a week with a goal of 10-15lb weight loss in the next 3 months. Patient voiced their understanding and motivation to adhere to these recommendations.       S/P laparoscopic cholecystectomy    Performed 08/31/21, overall healing well.        Follow up plan: Return in about 6 months (around 03/26/2022) for HTN/HLD, SVT, LYMPHEDEMA.

## 2021-09-23 NOTE — Assessment & Plan Note (Signed)
Symptoms all improved after surgery.  No further concerns.

## 2021-09-23 NOTE — Assessment & Plan Note (Signed)
BMI 51.83.  Recommended eating smaller high protein, low fat meals more frequently and exercising 30 mins a day 5 times a week with a goal of 10-15lb weight loss in the next 3 months. Patient voiced their understanding and motivation to adhere to these recommendations.

## 2021-10-27 ENCOUNTER — Other Ambulatory Visit: Payer: Self-pay | Admitting: Nurse Practitioner

## 2021-10-27 NOTE — Telephone Encounter (Signed)
Refused Lasix refill request because being requested too early.  08/09/2021 #180, 4 refills was given

## 2021-11-06 ENCOUNTER — Other Ambulatory Visit: Payer: Self-pay | Admitting: Nurse Practitioner

## 2021-11-06 ENCOUNTER — Other Ambulatory Visit: Payer: Self-pay | Admitting: Cardiovascular Disease

## 2021-11-08 NOTE — Telephone Encounter (Signed)
Requested medication (s) are due for refill today:   No  Requested medication (s) are on the active medication list:   No  Future visit scheduled:   Yes   Last ordered: Discontinued 12/17/2020.  Returned because a request came in for this medication which has been discontinued.   Requested Prescriptions  Pending Prescriptions Disp Refills   mupirocin ointment (BACTROBAN) 2 % [Pharmacy Med Name: MUPIROCIN 2% OINTMENT] 22 g 0    Sig: APPLY TO AFFECTED AREA TWICE A DAY     Off-Protocol Failed - 11/06/2021  3:45 PM      Failed - Medication not assigned to a protocol, review manually.      Passed - Valid encounter within last 12 months    Recent Outpatient Visits           1 month ago Morbid obesity due to excess calories ([place])   [place], [place] T, NP   2 months ago SVT (supraventricular tachycardia) ([place])   [place], [place] T, NP   3 months ago SVT (supraventricular tachycardia) ([place])   [place], Jolene T, NP   7 months ago Acute sinusitis, recurrence not specified, unspecified location   [place], MD   7 months ago Nausea and vomiting, unspecified vomiting type   [place], Ashley, DO       Future Appointments             In 4 months Cannady, Barbaraann Faster, NP [facility], [facility]

## 2021-11-30 ENCOUNTER — Other Ambulatory Visit: Payer: Self-pay | Admitting: Nurse Practitioner

## 2021-12-01 NOTE — Telephone Encounter (Signed)
Requested medication (s) are due for refill today: yes  Requested medication (s) are on the active medication list: yes  Last refill:  11/08/21  Future visit scheduled: yes  Notes to clinic:  Medication not assigned to a protocol, review manually.     Requested Prescriptions  Pending Prescriptions Disp Refills   mupirocin ointment (BACTROBAN) 2 % [Pharmacy Med Name: MUPIROCIN 2% OINTMENT] 22 g 0    Sig: APPLY TO AFFECTED AREA TWICE A DAY     Off-Protocol Failed - 11/30/2021  6:18 PM      Failed - Medication not assigned to a protocol, review manually.      Passed - Valid encounter within last 12 months    Recent Outpatient Visits           2 months ago Morbid obesity due to excess calories ([place])   [place], [place] T, NP   3 months ago SVT (supraventricular tachycardia) ([place])   [place], [place] T, NP   3 months ago SVT (supraventricular tachycardia) ([place])   [place], Jolene T, NP   8 months ago Acute sinusitis, recurrence not specified, unspecified location   [place], MD   8 months ago Nausea and vomiting, unspecified vomiting type   [place], [place], DO       Future Appointments             In 3 months Cannady, Barbaraann Faster, NP [facility]

## 2022-02-08 ENCOUNTER — Telehealth: Payer: Self-pay | Admitting: Nurse Practitioner

## 2022-02-08 NOTE — Telephone Encounter (Signed)
Copied from [place] [id number]. Topic: Medicare AWV >> Feb 08, 2022  3:50 PM Josephina Gip wrote: Reason for CRM: Left message for patient to call back and schedule the Medicare Annual Wellness Visit (AWV) virtually or by telephone.  Last AWV 01/19/21  Please schedule at anytime with [facility] Health Advisor.    Any questions, please call me at [phone]

## 2022-02-15 ENCOUNTER — Other Ambulatory Visit: Payer: Self-pay | Admitting: Nurse Practitioner

## 2022-02-15 ENCOUNTER — Other Ambulatory Visit: Payer: Self-pay | Admitting: Cardiovascular Disease

## 2022-02-15 NOTE — Telephone Encounter (Signed)
Please schedule patient overdue 12 month follow up with Dr. Fletcher Anon. Medication refills pending appt.  Thank you.

## 2022-02-15 NOTE — Telephone Encounter (Signed)
Unable to refill per protocol, Rx request are too soon. Last refill 02/08/21 for 90 and 4 refills.  Requested Prescriptions  Pending Prescriptions Disp Refills   ELIQUIS 5 MG TABS tablet [Pharmacy Med Name: ELIQUIS 5 MG TABLET] 180 tablet 4    Sig: TAKE 1 TABLET BY MOUTH TWICE A DAY     Hematology:  Anticoagulants - apixaban Passed - 02/15/2022  9:35 AM      Passed - PLT in normal range and within 360 days    Platelets  Date Value Ref Range Status  08/23/2021 189 150 - 450 x10E3/uL Final         Passed - HGB in normal range and within 360 days    Hemoglobin  Date Value Ref Range Status  08/23/2021 12.5 11.1 - 15.9 g/dL Final         Passed - HCT in normal range and within 360 days    Hematocrit  Date Value Ref Range Status  08/23/2021 39.2 34.0 - 46.6 % Final         Passed - Cr in normal range and within 360 days    Creatinine, Ser  Date Value Ref Range Status  09/16/2021 0.67 0.57 - 1.00 mg/dL Final         Passed - AST in normal range and within 360 days    AST  Date Value Ref Range Status  08/23/2021 14 0 - 40 IU/L Final         Passed - ALT in normal range and within 360 days    ALT  Date Value Ref Range Status  08/23/2021 10 0 - 32 IU/L Final         Passed - Valid encounter within last 12 months    Recent Outpatient Visits           4 months ago Morbid obesity due to excess calories ([place])   [place], Jolene T, NP   5 months ago SVT (supraventricular tachycardia) ([place])   [place], [place] T, NP   6 months ago SVT (supraventricular tachycardia) ([place])   [place], Jolene T, NP   10 months ago Acute sinusitis, recurrence not specified, unspecified location   [facility] [place], Avanti, MD   10 months ago Nausea and vomiting, unspecified vomiting type   [place], [place], DO       Future Appointments             In 1 month Cannady, Henrine Screws T, NP  [facility]             losartan (COZAAR) 100 MG tablet [Pharmacy Med Name: LOSARTAN POTASSIUM 100 MG TAB] 90 tablet 4    Sig: TAKE 1 TABLET BY MOUTH EVERY DAY     Cardiovascular:  Angiotensin Receptor Blockers Passed - 02/15/2022  9:35 AM      Passed - Cr in normal range and within 180 days    Creatinine, Ser  Date Value Ref Range Status  09/16/2021 0.67 0.57 - 1.00 mg/dL Final         Passed - K in normal range and within 180 days    Potassium  Date Value Ref Range Status  09/16/2021 3.9 3.5 - 5.2 mmol/L Final         Passed - Patient is not pregnant      Passed - Last BP in normal range    BP Readings from Last  1 Encounters:  09/23/21 117/76         Passed - Valid encounter within last 6 months    Recent Outpatient Visits           4 months ago Morbid obesity due to excess calories ([place])   [place], Jolene T, NP   5 months ago SVT (supraventricular tachycardia) ([place])   [place], Jolene T, NP   6 months ago SVT (supraventricular tachycardia) ([place])   [place], Jolene T, NP   10 months ago Acute sinusitis, recurrence not specified, unspecified location   [facility] Vigg, Avanti, MD   10 months ago Nausea and vomiting, unspecified vomiting type   [place], [place], DO       Future Appointments             In 1 month Cannady, Barbaraann Faster, NP [facility]             levocetirizine (XYZAL) 5 MG tablet [employer] Med Name: LEVOCETIRIZINE 5 MG TABLET] 90 tablet 4    Sig: TAKE 1 TABLET BY MOUTH EVERY DAY IN THE EVENING     Ear, Nose, and Throat:  Antihistamines - levocetirizine dihydrochloride Passed - 02/15/2022  9:35 AM      Passed - Cr in normal range and within 360 days    Creatinine, Ser  Date Value Ref Range Status  09/16/2021 0.67 0.57 - 1.00 mg/dL Final         Passed - eGFR is 10 or above and within 360 days     GFR calc Af Amer  Date Value Ref Range Status  01/07/2020 111 >59 mL/min/1.73 Final    Comment:    **In accordance with recommendations from the NKF-ASN Task force,**   [facility] is in the process of updating its eGFR calculation to the   2021 CKD-EPI creatinine equation that estimates kidney function   without a race variable.    GFR, Estimated  Date Value Ref Range Status  11/13/2020 >60 >60 mL/min Final    Comment:    (NOTE) Calculated using the CKD-EPI Creatinine Equation (2021)    eGFR  Date Value Ref Range Status  09/16/2021 96 >59 mL/min/1.73 Final         Passed - Valid encounter within last 12 months    Recent Outpatient Visits           4 months ago Morbid obesity due to excess calories ([place])   [place], Jolene T, NP   5 months ago SVT (supraventricular tachycardia) ([place])   [place], [place] T, NP   6 months ago SVT (supraventricular tachycardia) ([place])   [place], Jolene T, NP   10 months ago Acute sinusitis, recurrence not specified, unspecified location   [place], MD   10 months ago Nausea and vomiting, unspecified vomiting type   [place], [place], DO       Future Appointments             In 1 month Cannady, Henrine Screws T, NP [place]             atorvastatin (LIPITOR) 40 MG tablet [Pharmacy Med Name: ATORVASTATIN 40 MG TABLET] 90 tablet 4    Sig: TAKE 1 TABLET BY MOUTH EVERY DAY     Cardiovascular:  Antilipid - Statins Failed - 02/15/2022  9:35  AM      Failed - Lipid Panel in normal range within the last 12 months    Cholesterol, Total  Date Value Ref Range Status  08/09/2021 112 100 - 199 mg/dL Final   LDL Chol Calc (NIH)  Date Value Ref Range Status  08/09/2021 45 0 - 99 mg/dL Final   HDL  Date Value Ref Range Status  08/09/2021 48 >39 mg/dL Final   Triglycerides  Date Value Ref Range Status   08/09/2021 102 0 - 149 mg/dL Final         Passed - Patient is not pregnant      Passed - Valid encounter within last 12 months    Recent Outpatient Visits           4 months ago Morbid obesity due to excess calories ([place])   [place], Jolene T, NP   5 months ago SVT (supraventricular tachycardia) ([place])   [place], Jolene T, NP   6 months ago SVT (supraventricular tachycardia) ([place])   [place], Jolene T, NP   10 months ago Acute sinusitis, recurrence not specified, unspecified location   [place], MD   10 months ago Nausea and vomiting, unspecified vomiting type   [place], [place], DO       Future Appointments             In 1 month Cannady, Barbaraann Faster, NP [facility]

## 2022-02-21 NOTE — Telephone Encounter (Signed)
Lvm to schedule

## 2022-02-22 ENCOUNTER — Other Ambulatory Visit: Payer: Self-pay | Admitting: Cardiovascular Disease

## 2022-02-22 MED ORDER — APIXABAN 5 MG PO TABS: 5.0000 mg | ORAL_TABLET | Freq: Two times a day (BID) | ORAL | 4 refills | Status: DC

## 2022-02-22 NOTE — Telephone Encounter (Signed)
Please review

## 2022-02-22 NOTE — Telephone Encounter (Signed)
*  STAT* If patient is at the pharmacy, call can be transferred to refill team.   1. Which medications need to be refilled? (please list name of each medication and dose if known)   apixaban (ELIQUIS) 5 MG TABS tablet    2. Which pharmacy/location (including street and city if local pharmacy) is medication to be sent to?  [id number] [address] [place]   3. Do they need a 30 day or 90 day supply?  90 day

## 2022-02-22 NOTE — Telephone Encounter (Signed)
Pt last saw Diona Browner, NP on 08/27/21, last labs 09/16/21 Creat 0.67, age 67, weight 132.7kg, based on specified criteria pt is on appropriate dosage of Eliquis '5mg'$  BID for PE.  PCP [place] refills this medication will forward to them to refill since it is not a cardiac indication.  Pt requesting refill, please address Thanks!

## 2022-03-04 ENCOUNTER — Ambulatory Visit: Payer: Medicare Other | Attending: Medical | Admitting: Medical

## 2022-03-04 ENCOUNTER — Encounter: Payer: Self-pay | Admitting: Medical

## 2022-03-04 VITALS — BP 126/68 | HR 64 | Ht 63.0 in | Wt 287.2 lb

## 2022-03-04 DIAGNOSIS — R011 Cardiac murmur, unspecified: Secondary | ICD-10-CM

## 2022-03-04 DIAGNOSIS — Z86711 Personal history of pulmonary embolism: Secondary | ICD-10-CM

## 2022-03-04 DIAGNOSIS — R0609 Other forms of dyspnea: Secondary | ICD-10-CM | POA: Diagnosis not present

## 2022-03-04 DIAGNOSIS — I471 Supraventricular tachycardia, unspecified: Secondary | ICD-10-CM | POA: Diagnosis not present

## 2022-03-04 DIAGNOSIS — I10 Essential (primary) hypertension: Secondary | ICD-10-CM | POA: Diagnosis not present

## 2022-03-04 NOTE — Progress Notes (Signed)
Cardiology Office Note:    Date:  03/04/2022   ID:  Jasmine Webb, DOB 1955-10-27, MRN [id number]  PCP:  Venita Lick, NP  [facility] Cardiologist:  Kathlyn Sacramento, MD  [facility] Electrophysiologist:  None   Referring MD: Venita Lick, NP   Chief Complaint: 12 month follow-up  History of Present Illness:    Jasmine Webb is a 68 y.o. female with a hx of palpitations due to suspected paroxysmal SVT, HTN, pulmonary embolism, HLD, obesity who presents for 1 year follow-up.   She was hospitalized in September 2021 with shortness of breath and chest pain with mildly elevated troponin. She tested positive for COVID. CTA showed sub-massive pulmonary embolism with RV strain. She underwent catheter-based thrombolysis with thrombectomy. She was anticoagulated with Eliquis. Echo in September 2021 showed LVEF 55-60^, G1DD and normal RV function.   The patient had palpitations with mild dizziness. A heart monitor showed multiple runs of SVT, lasting 10 minutes and 33 seconds with frequent PACs and rare PVCs. Based on these results she was started on metoprolol '25mg'$  BID. Echo was done July and showed LVEF 60-65%, G1DD, borderline pulmonary HTN with an estimated RVSP 25mHg, mild MR and mild to mod TR.   Lexiscan in July 2022 was normal without scar or ischemia, normal LVEF, overall it was a low risk study.   Last seen 12/2020 and was overall doing well with minimal palpitations.   Today, the patient has been doing well from a cardiac perspective. No chest pain, SOB, palpations, lower leg edema, orthopnea or pnd. She wears TED hoes all the time. She uses a cane sometimes. She lives with a son. EKG shows NSR with PACs. She is on metoprolol. She is on indefinite Eliquis. She takes lasix '20mg'$  BID.   Past Medical History:  Diagnosis Date   Arthritis    History of emergence delirium    Hyperlipidemia    Hypertension    Tumor    breast and pelvic   Vertigo     Past Surgical History:   Procedure Laterality Date   ABDOMINAL HYSTERECTOMY  2003   BREAST SURGERY     CHOLECYSTECTOMY     PULMONARY THROMBECTOMY N/A 10/10/2019   Procedure: PULMONARY THROMBECTOMY;  Surgeon: DAlgernon Huxley MD;  [facility];  Service: Cardiovascular;  Laterality: N/A;    Current Medications: Current Meds  Medication Sig   apixaban (ELIQUIS) 5 MG TABS tablet Take 1 tablet (5 mg total) by mouth 2 (two) times daily.   atorvastatin (LIPITOR) 40 MG tablet Take 1 tablet (40 mg total) by mouth daily.   fexofenadine (ALLEGRA ALLERGY) 180 MG tablet Take 1 tablet (180 mg total) by mouth daily.   fluticasone (FLONASE) 50 MCG/ACT nasal spray Place 2 sprays into both nostrils daily.   furosemide (LASIX) 20 MG tablet TAKE 1 TABLET BY MOUTH TWICE A DAY AS NEEDED FOR EDEMA   levocetirizine (XYZAL) 5 MG tablet TAKE 1 TABLET BY MOUTH EVERY DAY IN THE EVENING   losartan (COZAAR) 100 MG tablet Take 1 tablet (100 mg total) by mouth daily.   metoprolol tartrate (LOPRESSOR) 25 MG tablet TAKE 1 TABLET BY MOUTH TWICE A DAY   mupirocin ointment (BACTROBAN) 2 % APPLY TO AFFECTED AREA TWICE A DAY   potassium chloride (KLOR-CON M) 10 MEQ tablet Take 1 tablet (10 mEq total) by mouth daily.     Allergies:   Fish allergy and Ibuprofen   Social History   Socioeconomic History  Marital status: Divorced    Spouse name: Not on file   Number of children: Not on file   Years of education: Not on file   Highest education level: Not on file  Occupational History   Not on file  Tobacco Use   Smoking status: Never   Smokeless tobacco: Never  Vaping Use   Vaping Use: Never used  Substance and Sexual Activity   Alcohol use: Never   Drug use: Never   Sexual activity: Not Currently  Other Topics Concern   Not on file  Social History Narrative   Not on file   Social Determinants of Health   Financial Resource Strain: Low Risk  (01/19/2021)   Overall Financial Resource Strain (CARDIA)    Difficulty  of Paying Living Expenses: Not hard at all  Food Insecurity: No Food Insecurity (01/19/2021)   Hunger Vital Sign    Worried About Running Out of Food in the Last Year: Never true    [place] in the Last Year: Never true  Transportation Needs: No Transportation Needs (01/19/2021)   PRAPARE - Hydrologist (Medical): No    Lack of Transportation (Non-Medical): No  Physical Activity: Insufficiently Active (01/19/2021)   Exercise Vital Sign    Days of Exercise per Week: 3 days    Minutes of Exercise per Session: 40 min  Stress: No Stress Concern Present (01/19/2021)   [place]    Feeling of Stress : Not at all  Social Connections: Moderately Isolated (01/19/2021)   Social Connection and Isolation Panel [NHANES]    Frequency of Communication with Friends and Family: More than three times a week    Frequency of Social Gatherings with Friends and Family: More than three times a week    Attends Religious Services: More than 4 times per year    Active Member of [employer] or Organizations: No    Attends Music therapist: Never    Marital Status: Divorced     Family History: The patient's family history includes ALS in her father; Asthma in her son; Breast cancer in her sister; COPD in her mother; Colon cancer in her sister; Congestive Heart Failure in her maternal uncle; Diabetes in her brother; Heart disease in her sister; Hypertension in her brother; Sinusitis in her son.  ROS:   Please see the history of present illness.     All other systems reviewed and are negative.  EKGs/Labs/Other Studies Reviewed:    The following studies were reviewed today:  Echo 2022 1. Left ventricular ejection fraction, by estimation, is 60 to 65%. The  left ventricle has normal function. The left ventricle has no regional  wall motion abnormalities. Left ventricular diastolic parameters are   consistent with Grade I diastolic  dysfunction (impaired relaxation).   2. Right ventricular systolic function is normal. The right ventricular  size is normal. There is normal pulmonary artery systolic pressure. The  estimated right ventricular systolic pressure is 49.8 mmHg.   3. Left atrial size was mild to moderately dilated.   4. The mitral valve is normal in structure. Mild mitral valve  regurgitation. No evidence of mitral stenosis. Moderate mitral annular  calcification.   5. Tricuspid valve regurgitation is moderate.   Comparison(s): H/o Pulmonary embolism.   Myoview Lexiscan 07/2020 Narrative & Impression  Normal pharmacologic myocardial perfusion stress test without significant ischemia or scar. The left ventricular ejection fraction is normal (62%).  There is no significant coronary artery calcification. Mitral annular calcification is noted. This is a low risk study.      Echo 2021  1. Left ventricular ejection fraction, by estimation, is 55 to 60%. The  left ventricle has normal function. The left ventricle has no regional  wall motion abnormalities. Left ventricular diastolic parameters are  consistent with Grade II diastolic  dysfunction (pseudonormalization).   2. Right ventricular systolic function is normal. The right ventricular  size is normal.   3. The mitral valve is grossly normal. Trivial mitral valve  regurgitation. Moderate mitral annular calcification.   4. The aortic valve is grossly normal. Aortic valve regurgitation is not  visualized.   EKG:  EKG is ordered today.  The ekg ordered today demonstrates NSR 64bpm, PACs, nonspecific T wave changes  Recent Labs: 08/09/2021: TSH 3.760 08/23/2021: ALT 10; Hemoglobin 12.5; Platelets 189 09/16/2021: BUN 13; Creatinine, Ser 0.67; Potassium 3.9; Sodium 143  Recent Lipid Panel    Component Value Date/Time   CHOL 112 08/09/2021 0937   TRIG 102 08/09/2021 0937   HDL 48 08/09/2021 0937   CHOLHDL 3.9  10/10/2019 0851   VLDL 14 10/10/2019 0851   LDLCALC 45 08/09/2021 0937     Physical Exam:    VS:  BP 126/68 (BP Location: Left Arm, Patient Position: Sitting, Cuff Size: Large)   Pulse 64   Ht '5\' 3"'$  (1.6 m)   Wt 287 lb 3.2 oz (130.3 kg)   LMP  (LMP Unknown)   SpO2 95%   BMI 50.88 kg/m     Wt Readings from Last 3 Encounters:  03/04/22 287 lb 3.2 oz (130.3 kg)  09/23/21 292 lb 9.6 oz (132.7 kg)  09/14/21 291 lb 3.2 oz (132.1 kg)     GEN:  Well nourished, well developed in no acute distress HEENT: Normal NECK: No JVD; No carotid bruits LYMPHATICS: No lymphadenopathy CARDIAC: RRR, no murmurs, rubs, gallops RESPIRATORY:  Clear to auscultation without rales, wheezing or rhonchi  ABDOMEN: Soft, non-tender, non-distended MUSCULOSKELETAL:  No edema; No deformity  SKIN: Warm and dry NEUROLOGIC:  Alert and oriented x 3 PSYCHIATRIC:  Normal affect   ASSESSMENT:    1. PSVT (paroxysmal supraventricular tachycardia)   2. Cardiac murmur   3. DOE (dyspnea on exertion)   4. History of pulmonary embolus (PE)   5. Essential hypertension    PLAN:    In order of problems listed above:  pSVT Patient denies any palpitations. Continue metoprolol '25mg'$  BID. EKG shows NSR with PACs.  HTN BP is good today, continue current medications.   HLD LDL 45. Continue Lipitor '40mg'$  daily.   H/o submassive PE in the setting of COVID 10/2019 The patient denies worsening lower leg edema. She wears compression hoes daily. Continue indefinite anticoagulation with Eliquis '5mg'$  BID.   Disposition: Follow up in 6 month(s) with MD     Signed, Nakya Weyand Ninfa Meeker, PA-C  03/04/2022 10:15 AM    [facility]

## 2022-03-04 NOTE — Patient Instructions (Signed)
Medication Instructions:  Your physician recommends that you continue on your current medications as directed. Please refer to the Current Medication list given to you today.  *If you need a refill on your cardiac medications before your next appointment, please call your pharmacy*   Lab Work: No labs ordered  If you have labs (blood work) drawn today and your tests are completely normal, you will receive your results only by: [place] (if you have MyChart) OR A paper copy in the mail If you have any lab test that is abnormal or we need to change your treatment, we will call you to review the results.   Testing/Procedures: No testing ordered  Follow-Up: At [facility], you and your health needs are our priority.  As part of our continuing mission to provide you with exceptional heart care, we have created designated Provider Care Teams.  These Care Teams include your primary Cardiologist (physician) and Advanced Practice Providers (APPs -  Physician Assistants and Nurse Practitioners) who all work together to provide you with the care you need, when you need it.  We recommend signing up for the patient portal called "MyChart".  Sign up information is provided on this After Visit Summary.  MyChart is used to connect with patients for Virtual Visits (Telemedicine).  Patients are able to view lab/test results, encounter notes, upcoming appointments, etc.  Non-urgent messages can be sent to your provider as well.   To learn more about what you can do with MyChart, go to [email or web address].    Your next appointment:   6 month(s)  Provider:   Kathlyn Sacramento, MD

## 2022-03-22 ENCOUNTER — Other Ambulatory Visit: Payer: Self-pay | Admitting: Nurse Practitioner

## 2022-03-22 ENCOUNTER — Other Ambulatory Visit: Payer: Self-pay | Admitting: Cardiovascular Disease

## 2022-03-23 NOTE — Telephone Encounter (Signed)
Rx: atorvastatin, levocetirizine, losartan: all written for 15 months- will extend even though expired. Requested Prescriptions  Pending Prescriptions Disp Refills   mupirocin ointment (BACTROBAN) 2 % [Pharmacy Med Name: MUPIROCIN 2% OINTMENT] 22 g 3    Sig: APPLY TO AFFECTED AREA TWICE A DAY     Off-Protocol Failed - 03/22/2022  1:14 PM      Failed - Medication not assigned to a protocol, review manually.      Passed - Valid encounter within last 12 months    Recent Outpatient Visits           6 months ago Morbid obesity due to excess calories ([place])   [place], [place] T, NP   7 months ago SVT (supraventricular tachycardia) ([place])   [place], Henrine Screws T, NP   7 months ago SVT (supraventricular tachycardia) ([place])   [place], Henrine Screws T, NP   11 months ago Acute sinusitis, recurrence not specified, unspecified location   [place] Vigg, Avanti, MD   11 months ago Nausea and vomiting, unspecified vomiting type   [place], [place], DO       Future Appointments             In 5 days Cannady, Barbaraann Faster, NP [place], [facility]             losartan (COZAAR) 100 MG tablet [Pharmacy Med Name: LOSARTAN POTASSIUM 100 MG TAB] 90 tablet 4    Sig: TAKE 1 TABLET BY MOUTH EVERY DAY     Cardiovascular:  Angiotensin Receptor Blockers Failed - 03/22/2022  1:14 PM      Failed - Cr in normal range and within 180 days    Creatinine, Ser  Date Value Ref Range Status  09/16/2021 0.67 0.57 - 1.00 mg/dL Final         Failed - K in normal range and within 180 days    Potassium  Date Value Ref Range Status  09/16/2021 3.9 3.5 - 5.2 mmol/L Final         Passed - Patient is not pregnant      Passed - Last BP in normal range    BP Readings from Last 1 Encounters:  03/04/22 126/68         Passed - Valid  encounter within last 6 months    Recent Outpatient Visits           6 months ago Morbid obesity due to excess calories ([place])   [place], Henrine Screws T, NP   7 months ago SVT (supraventricular tachycardia) ([place])   [place], [place] T, NP   7 months ago SVT (supraventricular tachycardia) ([place])   [place], [place] T, NP   11 months ago Acute sinusitis, recurrence not specified, unspecified location   [place] Vigg, Avanti, MD   11 months ago Nausea and vomiting, unspecified vomiting type   [place], [place], DO       Future Appointments             In 5 days Cannady, Barbaraann Faster, NP [place], [facility]             atorvastatin (LIPITOR) 40 MG tablet [Pharmacy Med Name: ATORVASTATIN 40 MG TABLET] 90 tablet  4    Sig: TAKE 1 TABLET BY MOUTH EVERY DAY     Cardiovascular:  Antilipid - Statins Failed - 03/22/2022  1:14 PM      Failed - Lipid Panel in normal range within the last 12 months    Cholesterol, Total  Date Value Ref Range Status  08/09/2021 112 100 - 199 mg/dL Final   LDL Chol Calc (NIH)  Date Value Ref Range Status  08/09/2021 45 0 - 99 mg/dL Final   HDL  Date Value Ref Range Status  08/09/2021 48 >39 mg/dL Final   Triglycerides  Date Value Ref Range Status  08/09/2021 102 0 - 149 mg/dL Final         Passed - Patient is not pregnant      Passed - Valid encounter within last 12 months    Recent Outpatient Visits           6 months ago Morbid obesity due to excess calories ([place])   [place], [place] T, NP   7 months ago SVT (supraventricular tachycardia) ([place])   [place], [place] T, NP   7 months ago SVT (supraventricular tachycardia) ([place])   [place], Macclenny T, NP   11  months ago Acute sinusitis, recurrence not specified, unspecified location   [place] Vigg, Avanti, MD   11 months ago Nausea and vomiting, unspecified vomiting type   [place], [place], DO       Future Appointments             In 5 days Cannady, Barbaraann Faster, NP [place], [facility]             levocetirizine (XYZAL) 5 MG tablet [Pharmacy Med Name: LEVOCETIRIZINE 5 MG TABLET] 90 tablet 4    Sig: TAKE 1 TABLET BY MOUTH EVERY DAY IN THE EVENING     Ear, Nose, and Throat:  Antihistamines - levocetirizine dihydrochloride Passed - 03/22/2022  1:14 PM      Passed - Cr in normal range and within 360 days    Creatinine, Ser  Date Value Ref Range Status  09/16/2021 0.67 0.57 - 1.00 mg/dL Final         Passed - eGFR is 10 or above and within 360 days    GFR calc Af Amer  Date Value Ref Range Status  01/07/2020 111 >59 mL/min/1.73 Final    Comment:    **In accordance with recommendations from the NKF-ASN Task force,**   [facility] is in the process of updating its eGFR calculation to the   2021 CKD-EPI creatinine equation that estimates kidney function   without a race variable.    GFR, Estimated  Date Value Ref Range Status  11/13/2020 >60 >60 mL/min Final    Comment:    (NOTE) Calculated using the CKD-EPI Creatinine Equation (2021)    eGFR  Date Value Ref Range Status  09/16/2021 96 >59 mL/min/1.73 Final         Passed - Valid encounter within last 12 months    Recent Outpatient Visits           6 months ago Morbid obesity due to excess calories ([place])   [place], Henrine Screws T, NP   7 months ago SVT (supraventricular tachycardia) ([place])   [place], Henrine Screws T, NP   7 months  ago SVT (supraventricular tachycardia) ([place])   [place], [place] T, NP   11 months ago Acute sinusitis, recurrence not  specified, unspecified location   [place] Vigg, Avanti, MD   11 months ago Nausea and vomiting, unspecified vomiting type   [place], Barb Merino, DO       Future Appointments             In 5 days Cannady, Barbaraann Faster, NP [place], [facility]

## 2022-03-23 NOTE — Telephone Encounter (Signed)
Medication: mupirocin ointment (BACTROBAN) 2 %  Inadvertently filled with other medications- should have been - off protocol- provider review - sent for review of fill

## 2022-03-26 NOTE — Patient Instructions (Signed)

## 2022-03-28 ENCOUNTER — Ambulatory Visit (INDEPENDENT_AMBULATORY_CARE_PROVIDER_SITE_OTHER): Payer: Medicare Other | Admitting: Nurse Practitioner

## 2022-03-28 ENCOUNTER — Encounter: Payer: Self-pay | Admitting: Nurse Practitioner

## 2022-03-28 DIAGNOSIS — D6869 Other thrombophilia: Secondary | ICD-10-CM | POA: Diagnosis not present

## 2022-03-28 DIAGNOSIS — Z86711 Personal history of pulmonary embolism: Secondary | ICD-10-CM | POA: Diagnosis not present

## 2022-03-28 DIAGNOSIS — R7309 Other abnormal glucose: Secondary | ICD-10-CM

## 2022-03-28 DIAGNOSIS — I471 Supraventricular tachycardia, unspecified: Secondary | ICD-10-CM | POA: Diagnosis not present

## 2022-03-28 DIAGNOSIS — E782 Mixed hyperlipidemia: Secondary | ICD-10-CM | POA: Diagnosis not present

## 2022-03-28 DIAGNOSIS — I10 Essential (primary) hypertension: Secondary | ICD-10-CM

## 2022-03-28 DIAGNOSIS — K760 Fatty (change of) liver, not elsewhere classified: Secondary | ICD-10-CM | POA: Diagnosis not present

## 2022-03-28 DIAGNOSIS — I071 Rheumatic tricuspid insufficiency: Secondary | ICD-10-CM | POA: Diagnosis not present

## 2022-03-28 DIAGNOSIS — Z6841 Body Mass Index (BMI) 40.0 and over, adult: Secondary | ICD-10-CM | POA: Diagnosis not present

## 2022-03-28 DIAGNOSIS — I890 Lymphedema, not elsewhere classified: Secondary | ICD-10-CM | POA: Diagnosis not present

## 2022-03-28 DIAGNOSIS — I340 Nonrheumatic mitral (valve) insufficiency: Secondary | ICD-10-CM

## 2022-03-28 DIAGNOSIS — Z0000 Encounter for general adult medical examination without abnormal findings: Secondary | ICD-10-CM

## 2022-03-28 DIAGNOSIS — E559 Vitamin D deficiency, unspecified: Secondary | ICD-10-CM

## 2022-03-28 LAB — MICROALBUMIN, URINE WAIVED
Creatinine, Urine Waived: 50 mg/dL (ref 10–300)
Microalb, Ur Waived: 10 mg/L (ref 0–19)
Microalb/Creat Ratio: <30 mg/g (ref <30)

## 2022-03-28 LAB — BAYER DCA HB A1C WAIVED: HB A1C (BAYER DCA - WAIVED): 5.9 % — ABNORMAL HIGH (ref 4.8–5.6)

## 2022-03-28 MED ORDER — ATORVASTATIN CALCIUM 40 MG PO TABS: 40.0000 mg | ORAL_TABLET | Freq: Every day | ORAL | 4 refills | Status: DC

## 2022-03-28 MED ORDER — LOSARTAN POTASSIUM 100 MG PO TABS: 100.0000 mg | ORAL_TABLET | Freq: Every day | ORAL | 4 refills | Status: DC

## 2022-03-28 MED ORDER — METOPROLOL TARTRATE 25 MG PO TABS: 25.0000 mg | ORAL_TABLET | Freq: Two times a day (BID) | ORAL | 4 refills | Status: DC

## 2022-03-28 NOTE — Assessment & Plan Note (Signed)
Mild to Moderate on echo July 2022.  Followed by cardiology, continue this collaboration and current medication regimen.

## 2022-03-28 NOTE — Progress Notes (Signed)
BP 114/70   Pulse 65   Temp 97.6 F (36.4 C) (Oral)   Ht 5' 2.99" (1.6 m)   Wt 288 lb 8 oz (130.9 kg)   LMP  (LMP Unknown)   SpO2 94%   BMI 51.12 kg/m    Subjective:    Patient ID: Jasmine Webb, female    DOB: Aug 01, 1955, 67 y.o.   MRN: [id number]  HPI: Jasmine Webb is a 67 y.o. female  Chief Complaint  Patient presents with   Hypertension   SVT   Hyperlipidemia   Lymphedema   HYPERTENSION / HYPERLIPIDEMIA Continues on Losartan 100 MG, Metoprolol 25 MG BID, and Atorvastatin 40 MG daily.  Last saw vascular, Dr. Lucky Cowboy, on 03/25/21 -- no changes, to return only if leg causes issues.  Continues on Eliquis for history of PE with Covid. Last saw cardiology 03/04/22 -- for SVT and history of PE.  No changes made at visit.    She does have compression hose and is wearing them daily for her lymphedema.  Has Lasix to take as needed -- takes this daily.  Last A1c in July 2023 was in prediabetic range at 5.8%, focused on diet at home.  History of hepatic steatosis on imaging, working on diet changes.  Satisfied with current treatment? yes Duration of hypertension: chronic BP monitoring frequency: not checking BP range: 100-110/60-70 range BP medication side effects: no Duration of hyperlipidemia: chronic Cholesterol medication side effects: no Cholesterol supplements: none Medication compliance: good compliance Aspirin: no Recent stressors: no Recurrent headaches: no Visual changes: no Palpitations: no Dyspnea: at baseline Chest pain: no Lower extremity edema: at baseline, no worsening Dizzy/lightheaded: none     03/28/2022   11:05 AM 04/01/2021    9:13 AM 02/08/2021    3:52 PM 01/19/2021    9:13 AM 06/23/2020   10:29 AM  Depression screen PHQ 2/9  Decreased Interest 0 0 0 0 0  Down, Depressed, Hopeless 0 0 0 0 0  PHQ - 2 Score 0 0 0 0 0  Altered sleeping 0 0 0  0  Tired, decreased energy 0 0 0  0  Change in appetite 0 0 0  0  Feeling bad or failure about yourself  0 0 0   0  Trouble concentrating 0 0 0  0  Moving slowly or fidgety/restless 0 0 0  0  Suicidal thoughts 0 0 0  0  PHQ-9 Score 0 0 0  0  Difficult doing work/chores Not difficult at all Not difficult at all Not difficult at all  Not difficult at all       03/28/2022   11:05 AM 04/01/2021    9:13 AM 02/08/2021    3:52 PM  GAD 7 : Generalized Anxiety Score  Nervous, Anxious, on Edge 0 0 0  Control/stop worrying 0 0 0  Worry too much - different things 0 0 0  Trouble relaxing 0 0 0  Restless 0 0 0  Easily annoyed or irritable 0 0 0  Afraid - awful might happen 0 0 0  Total GAD 7 Score 0 0 0  Anxiety Difficulty  Not difficult at all Not difficult at all   Relevant past medical, surgical, family and social history reviewed and updated as indicated. Interim medical history since our last visit reviewed. Allergies and medications reviewed and updated.  Review of Systems  Constitutional:  Negative for activity change, appetite change, diaphoresis, fatigue and fever.  Respiratory:  Negative for cough, chest tightness and  shortness of breath.   Cardiovascular:  Negative for chest pain, palpitations and leg swelling.  Gastrointestinal: Negative.   Endocrine: Negative for polydipsia, polyphagia and polyuria.  Neurological: Negative.   Psychiatric/Behavioral: Negative.      Per HPI unless specifically indicated above     Objective:    BP 114/70   Pulse 65   Temp 97.6 F (36.4 C) (Oral)   Ht 5' 2.99" (1.6 m)   Wt 288 lb 8 oz (130.9 kg)   LMP  (LMP Unknown)   SpO2 94%   BMI 51.12 kg/m   Wt Readings from Last 3 Encounters:  03/28/22 288 lb 8 oz (130.9 kg)  03/04/22 287 lb 3.2 oz (130.3 kg)  09/23/21 292 lb 9.6 oz (132.7 kg)    Physical Exam Vitals and nursing note reviewed.  Constitutional:      General: She is awake. She is not in acute distress.    Appearance: She is well-developed and well-groomed. She is morbidly obese. She is not ill-appearing.  HENT:     Head: Normocephalic.      Right Ear: Hearing normal.     Left Ear: Hearing normal.  Eyes:     General: Lids are normal.        Right eye: No discharge.        Left eye: No discharge.     Conjunctiva/sclera: Conjunctivae normal.     Pupils: Pupils are equal, round, and reactive to light.  Neck:     Vascular: No carotid bruit.  Cardiovascular:     Rate and Rhythm: Normal rate and regular rhythm.     Heart sounds: Murmur heard.     Systolic murmur is present with a grade of 2/6.     No gallop.     Comments: Baseline edema BLE with compression hose on. Pulmonary:     Effort: Pulmonary effort is normal. No accessory muscle usage or respiratory distress.     Breath sounds: Normal breath sounds.  Abdominal:     General: Bowel sounds are normal.     Palpations: Abdomen is soft.  Musculoskeletal:     Cervical back: Normal range of motion and neck supple.     Right lower leg: 2+ Edema present.     Left lower leg: 2+ Edema present.  Skin:    General: Skin is warm and dry.  Neurological:     Mental Status: She is alert and oriented to person, place, and time.  Psychiatric:        Attention and Perception: Attention normal.        Mood and Affect: Mood normal.        Speech: Speech normal.        Behavior: Behavior normal. Behavior is cooperative.        Thought Content: Thought content normal.     Results for orders placed or performed in visit on [id number]  Basic Metabolic Panel (BMET)  Result Value Ref Range   Glucose 101 (H) 70 - 99 mg/dL   BUN 13 8 - 27 mg/dL   Creatinine, Ser 0.67 0.57 - 1.00 mg/dL   eGFR 96 >59 mL/min/1.73   BUN/Creatinine Ratio 19 12 - 28   Sodium 143 134 - 144 mmol/L   Potassium 3.9 3.5 - 5.2 mmol/L   Chloride 102 96 - 106 mmol/L   CO2 26 20 - 29 mmol/L   Calcium 8.9 8.7 - 10.3 mg/dL      Assessment & Plan:   Problem List  Items Addressed This Visit       Cardiovascular and Mediastinum   Essential hypertension    Chronic, stable.  BP at goal in office today.  Recommend  she monitor BP at home regularly and document.  Will continue Losartan 100 MG daily, Metoprolol, and Lasix 20 MG for edema.  Would avoid Amlodipine due to baseline edema to BLE with lymphedema. Focus on DASH diet at home.  Urine ALB 12 March 2022.  Return in 6 months.  Recommend: - Reminded to call for an overnight weight gain of >2 pounds or a weekly weight weight of >5 pounds - not adding salt to his food and has been reading food labels. Reviewed the importance of keeping daily sodium intake to '2000mg'$  daily       Relevant Medications   losartan (COZAAR) 100 MG tablet   atorvastatin (LIPITOR) 40 MG tablet   metoprolol tartrate (LOPRESSOR) 25 MG tablet   Other Relevant Orders   CBC with Differential/Platelet   Comprehensive metabolic panel   Mild mitral regurgitation by prior echocardiogram    Followed by cardiology, continue this collaboration and current medication regimen.      Relevant Medications   losartan (COZAAR) 100 MG tablet   atorvastatin (LIPITOR) 40 MG tablet   metoprolol tartrate (LOPRESSOR) 25 MG tablet   Other Relevant Orders   Comprehensive metabolic panel   Lipid Panel w/o Chol/HDL Ratio   Mild tricuspid regurgitation by prior echocardiogram    Mild to Moderate on echo July 2022.  Followed by cardiology, continue this collaboration and current medication regimen.      Relevant Medications   losartan (COZAAR) 100 MG tablet   atorvastatin (LIPITOR) 40 MG tablet   metoprolol tartrate (LOPRESSOR) 25 MG tablet   Other Relevant Orders   Comprehensive metabolic panel   Lipid Panel w/o Chol/HDL Ratio   SVT (supraventricular tachycardia)    Chronic, stable.  Followed by cardiology, will continue Metoprolol as ordered by them.  Recent notes and testing reviewed.  She has no cardiac symptoms at this time.      Relevant Medications   losartan (COZAAR) 100 MG tablet   atorvastatin (LIPITOR) 40 MG tablet   metoprolol tartrate (LOPRESSOR) 25 MG tablet   Other  Relevant Orders   Comprehensive metabolic panel   Lipid Panel w/o Chol/HDL Ratio     Digestive   Hepatic steatosis    Chronic.  Noted on ultrasound 08/16/21.  Recommend heavy focus on diet and regular exercise. Educated her on this finding.        Other   BMI 50.0-59.9, adult (HCC)    Recommended eating smaller high protein, low fat meals more frequently and exercising 30 mins a day 5 times a week with a goal of 10-15lb weight loss in the next 3 months. Patient voiced their understanding and motivation to adhere to these recommendations.       Elevated hemoglobin A1c measurement    A1c today remains stable at 5.9%, mild upward trend.  Continue heavy focus on prediabetic meal changes.      Relevant Orders   Microalbumin, Urine Waived   Bayer DCA Hb A1c Waived   History of pulmonary embolus (PE)    Stable.  With Covid in September 2021.  At this time will continue Eliquis and collaboration with vascular + cardiology.  Refills up to date.  Cardiology recommends lifelong treatment.      Hyperlipidemia    Chronic, ongoing.  Continue current medication regimen and adjust as needed,  Atorvastatin 40 MG.  Lipid panel today.      Relevant Medications   losartan (COZAAR) 100 MG tablet   atorvastatin (LIPITOR) 40 MG tablet   metoprolol tartrate (LOPRESSOR) 25 MG tablet   Other Relevant Orders   Comprehensive metabolic panel   Lipid Panel w/o Chol/HDL Ratio   Lymphedema    Chronic, stable.   Continue to collaborate with vascular as needed and use compression pumps at home.      Morbid obesity due to excess calories ([place]) - Primary    BMI 51.12.  Recommended eating smaller high protein, low fat meals more frequently and exercising 30 mins a day 5 times a week with a goal of 10-15lb weight loss in the next 3 months. Patient voiced their understanding and motivation to adhere to these recommendations.       Secondary hypercoagulable state ([place])    Taking Eliquis due to past PE.   Continue to monitor closely and check CBC annually.      Relevant Orders   CBC with Differential/Platelet   Vitamin D deficiency    Chronic, stable.  Recommend she continue supplement Vitamin D3 1000 units daily for bone health.  Recheck level today and recommend she schedule DEXA.      Relevant Orders   VITAMIN D 25 Hydroxy (Vit-D Deficiency, Fractures)     Follow up plan: Return in about 6 months (around 09/26/2022) for Annual physical.

## 2022-03-28 NOTE — Assessment & Plan Note (Addendum)
Chronic, stable.  BP at goal in office today.  Recommend she monitor BP at home regularly and document.  Will continue Losartan 100 MG daily, Metoprolol, and Lasix 20 MG for edema.  Would avoid Amlodipine due to baseline edema to BLE with lymphedema. Focus on DASH diet at home.  Urine ALB 12 March 2022.  Return in 6 months.  Recommend: - Reminded to call for an overnight weight gain of >2 pounds or a weekly weight weight of >5 pounds - not adding salt to his food and has been reading food labels. Reviewed the importance of keeping daily sodium intake to '2000mg'$  daily

## 2022-03-28 NOTE — Assessment & Plan Note (Signed)
Stable.  With Covid in September 2021.  At this time will continue Eliquis and collaboration with vascular + cardiology.  Refills up to date.  Cardiology recommends lifelong treatment.

## 2022-03-28 NOTE — Assessment & Plan Note (Signed)
A1c today remains stable at 5.9%, mild upward trend.  Continue heavy focus on prediabetic meal changes.

## 2022-03-28 NOTE — Assessment & Plan Note (Signed)
Recommended eating smaller high protein, low fat meals more frequently and exercising 30 mins a day 5 times a week with a goal of 10-15lb weight loss in the next 3 months. Patient voiced their understanding and motivation to adhere to these recommendations.  

## 2022-03-28 NOTE — Assessment & Plan Note (Signed)
Chronic, ongoing.  Continue current medication regimen and adjust as needed, Atorvastatin 40 MG.  Lipid panel today.

## 2022-03-28 NOTE — Assessment & Plan Note (Signed)
BMI 51.12.  Recommended eating smaller high protein, low fat meals more frequently and exercising 30 mins a day 5 times a week with a goal of 10-15lb weight loss in the next 3 months. Patient voiced their understanding and motivation to adhere to these recommendations.

## 2022-03-28 NOTE — Assessment & Plan Note (Signed)
Chronic.  Noted on ultrasound 08/16/21.  Recommend heavy focus on diet and regular exercise. Educated her on this finding.

## 2022-03-28 NOTE — Assessment & Plan Note (Signed)
Chronic, stable.   Continue to collaborate with vascular as needed and use compression pumps at home.

## 2022-03-28 NOTE — Assessment & Plan Note (Signed)
Chronic, stable.  Followed by cardiology, will continue Metoprolol as ordered by them.  Recent notes and testing reviewed.  She has no cardiac symptoms at this time.

## 2022-03-28 NOTE — Assessment & Plan Note (Signed)
Followed by cardiology, continue this collaboration and current medication regimen.

## 2022-03-28 NOTE — Assessment & Plan Note (Signed)
Chronic, stable.  Recommend she continue supplement Vitamin D3 1000 units daily for bone health.  Recheck level today and recommend she schedule DEXA.

## 2022-03-28 NOTE — Assessment & Plan Note (Signed)
Taking Eliquis due to past PE.  Continue to monitor closely and check CBC annually.

## 2022-03-29 LAB — CBC WITH DIFFERENTIAL/PLATELET
Basophils Absolute: 0 10*3/uL (ref 0.0–0.2)
Basos: 0 %
EOS (ABSOLUTE): 0.2 10*3/uL (ref 0.0–0.4)
Eos: 3 %
Hematocrit: 41.7 % (ref 34.0–46.6)
Hemoglobin: 13.4 g/dL (ref 11.1–15.9)
Immature Grans (Abs): 0 10*3/uL (ref 0.0–0.1)
Immature Granulocytes: 0 %
Lymphocytes Absolute: 1.4 10*3/uL (ref 0.7–3.1)
Lymphs: 19 %
MCH: 28 pg (ref 26.6–33.0)
MCHC: 32.1 g/dL (ref 31.5–35.7)
MCV: 87 fL (ref 79–97)
Monocytes Absolute: 0.4 10*3/uL (ref 0.1–0.9)
Monocytes: 6 %
Neutrophils Absolute: 5 10*3/uL (ref 1.4–7.0)
Neutrophils: 72 %
Platelets: 203 10*3/uL (ref 150–450)
RBC: 4.78 x10E6/uL (ref 3.77–5.28)
RDW: 12.8 % (ref 11.7–15.4)
WBC: 7.1 10*3/uL (ref 3.4–10.8)

## 2022-03-29 LAB — COMPREHENSIVE METABOLIC PANEL
ALT: 14 IU/L (ref 0–32)
AST: 16 IU/L (ref 0–40)
Albumin/Globulin Ratio: 1.8 (ref 1.2–2.2)
Albumin: 4.3 g/dL (ref 3.9–4.9)
Alkaline Phosphatase: 96 IU/L (ref 44–121)
BUN/Creatinine Ratio: 19 (ref 12–28)
BUN: 13 mg/dL (ref 8–27)
Bilirubin Total: 1.3 mg/dL — ABNORMAL HIGH (ref 0.0–1.2)
CO2: 25 mmol/L (ref 20–29)
Calcium: 9.4 mg/dL (ref 8.7–10.3)
Chloride: 102 mmol/L (ref 96–106)
Creatinine, Ser: 0.68 mg/dL (ref 0.57–1.00)
Globulin, Total: 2.4 g/dL (ref 1.5–4.5)
Glucose: 96 mg/dL (ref 70–99)
Potassium: 3.8 mmol/L (ref 3.5–5.2)
Sodium: 144 mmol/L (ref 134–144)
Total Protein: 6.7 g/dL (ref 6.0–8.5)
eGFR: 95 mL/min/{1.73_m2} (ref >59)

## 2022-03-29 LAB — LIPID PANEL W/O CHOL/HDL RATIO
Cholesterol, Total: 133 mg/dL (ref 100–199)
HDL: 48 mg/dL (ref >39)
LDL Chol Calc (NIH): 63 mg/dL (ref 0–99)
Triglycerides: 121 mg/dL (ref 0–149)
VLDL Cholesterol Cal: 22 mg/dL (ref 5–40)

## 2022-03-29 LAB — VITAMIN D 25 HYDROXY (VIT D DEFICIENCY, FRACTURES): Vit D, 25-Hydroxy: 46.6 ng/mL (ref 30.0–100.0)

## 2022-03-29 NOTE — Progress Notes (Signed)
Good afternoon, please let Jasmine Webb know her labs returned and are all stable.  No medication changes needed.  She is doing fantastic!!   [employer] job!! Keep being amazing!!  Thank you for allowing me to participate in your care.  I appreciate you. Kindest regards, Sharran Caratachea

## 2022-03-30 ENCOUNTER — Telehealth: Payer: Self-pay

## 2022-03-30 NOTE — Telephone Encounter (Signed)
Pt given lab results per notes of Marnee Guarneri, NP on 03/29/22. Pt verbalized understanding.    Venita Lick, NP 03/29/2022  1:32 PM EST     Good afternoon, please let Trinidi know her labs returned and are all stable.  No medication changes needed.  She is doing fantastic!!   [employer] job!! Keep being amazing!!  Thank you for allowing me to participate in your care.  I appreciate you. Kindest regards, Jolene

## 2022-03-31 ENCOUNTER — Telehealth: Payer: Self-pay | Admitting: Nurse Practitioner

## 2022-03-31 NOTE — Telephone Encounter (Signed)
Pt stated she received a call from a nurse to schedule her AWV. Pt is requesting a callback to schedule AWV.   Please advise.

## 2022-03-31 NOTE — Telephone Encounter (Signed)
Pt is calling to report that [facility] called her for her lab results. She received the results yesterday.

## 2022-03-31 NOTE — Telephone Encounter (Signed)
Contacted Isabelly Catledge to schedule their annual wellness visit. Appointment made for 04/19/2022.  Sherol Dade; [facility] Direct Dial: [phone]

## 2022-04-04 NOTE — Telephone Encounter (Signed)
Called pt but already scheduled for 04/19/2022 at 3:30 pm.

## 2022-04-19 ENCOUNTER — Ambulatory Visit (INDEPENDENT_AMBULATORY_CARE_PROVIDER_SITE_OTHER): Payer: Medicare Other

## 2022-04-19 VITALS — Ht 63.0 in | Wt 288.0 lb

## 2022-04-19 DIAGNOSIS — Z0000 Encounter for general adult medical examination without abnormal findings: Secondary | ICD-10-CM | POA: Diagnosis not present

## 2022-04-19 DIAGNOSIS — Z1211 Encounter for screening for malignant neoplasm of colon: Secondary | ICD-10-CM

## 2022-04-19 NOTE — Patient Instructions (Signed)
Jasmine Webb , Thank you for taking time to come for your Medicare Wellness Visit. I appreciate your ongoing commitment to your health goals. Please review the following plan we discussed and let me know if I can assist you in the future.   These are the goals we discussed:  Goals      DIET - EAT MORE FRUITS AND VEGETABLES     Patient Stated     01/13/2020, wants to lose weight     Weight (lb) < 200 lb (90.7 kg)        This is a list of the screening recommended for you and due dates:  Health Maintenance  Topic Date Due   Pneumonia Vaccine (2 of 2 - PPSV23 or PCV20) 02/08/2022   Flu Shot  05/01/2022*   Mammogram  08/10/2022*   DEXA scan (bone density measurement)  08/10/2022*   Cologuard (Stool DNA test)  05/08/2022   Medicare Annual Wellness Visit  04/19/2023   DTaP/Tdap/Td vaccine (2 - Td or Tdap) 06/24/2030   Hepatitis C Screening: USPSTF Recommendation to screen - Ages 61-79 yo.  Completed   Zoster (Shingles) Vaccine  Completed   HPV Vaccine  Aged Out   COVID-19 Vaccine  Discontinued  *Topic was postponed. The date shown is not the original due date.    Advanced directives: no  Conditions/risks identified: none  Next appointment: Follow up in one year for your annual wellness visit 04/25/23 @ 2:30 pm by phone   Preventive Care 65 Years and Older, Female Preventive care refers to lifestyle choices and visits with your health care provider that can promote health and wellness. What does preventive care include? A yearly physical exam. This is also called an annual well check. Dental exams once or twice a year. Routine eye exams. Ask your health care provider how often you should have your eyes checked. Personal lifestyle choices, including: Daily care of your teeth and gums. Regular physical activity. Eating a healthy diet. Avoiding tobacco and drug use. Limiting alcohol use. Practicing safe sex. Taking low-dose aspirin every day. Taking vitamin and mineral  supplements as recommended by your health care provider. What happens during an annual well check? The services and screenings done by your health care provider during your annual well check will depend on your age, overall health, lifestyle risk factors, and family history of disease. Counseling  Your health care provider may ask you questions about your: Alcohol use. Tobacco use. Drug use. Emotional well-being. Home and relationship well-being. Sexual activity. Eating habits. History of falls. Memory and ability to understand (cognition). Work and work Statistician. Reproductive health. Screening  You may have the following tests or measurements: Height, weight, and BMI. Blood pressure. Lipid and cholesterol levels. These may be checked every 5 years, or more frequently if you are over 12 years old. Skin check. Lung cancer screening. You may have this screening every year starting at age 18 if you have a 30-pack-year history of smoking and currently smoke or have quit within the past 15 years. Fecal occult blood test (FOBT) of the stool. You may have this test every year starting at age 62. Flexible sigmoidoscopy or colonoscopy. You may have a sigmoidoscopy every 5 years or a colonoscopy every 10 years starting at age 65. Hepatitis C blood test. Hepatitis B blood test. Sexually transmitted disease (STD) testing. Diabetes screening. This is done by checking your blood sugar (glucose) after you have not eaten for a while (fasting). You may have this done every  1-3 years. Bone density scan. This is done to screen for osteoporosis. You may have this done starting at age 10. Mammogram. This may be done every 1-2 years. Talk to your health care provider about how often you should have regular mammograms. Talk with your health care provider about your test results, treatment options, and if necessary, the need for more tests. Vaccines  Your health care provider may recommend certain  vaccines, such as: Influenza vaccine. This is recommended every year. Tetanus, diphtheria, and acellular pertussis (Tdap, Td) vaccine. You may need a Td booster every 10 years. Zoster vaccine. You may need this after age 99. Pneumococcal 13-valent conjugate (PCV13) vaccine. One dose is recommended after age 25. Pneumococcal polysaccharide (PPSV23) vaccine. One dose is recommended after age 62. Talk to your health care provider about which screenings and vaccines you need and how often you need them. This information is not intended to replace advice given to you by your health care provider. Make sure you discuss any questions you have with your health care provider. Document Released: 02/13/2015 Document Revised: 10/07/2015 Document Reviewed: 11/18/2014 Elsevier Interactive Patient Education  2017 [place] Prevention in the Home Falls can cause injuries. They can happen to people of all ages. There are many things you can do to make your home safe and to help prevent falls. What can I do on the outside of my home? Regularly fix the edges of walkways and driveways and fix any cracks. Remove anything that might make you trip as you walk through a door, such as a raised step or threshold. Trim any bushes or trees on the path to your home. Use bright outdoor lighting. Clear any walking paths of anything that might make someone trip, such as rocks or tools. Regularly check to see if handrails are loose or broken. Make sure that both sides of any steps have handrails. Any raised decks and porches should have guardrails on the edges. Have any leaves, snow, or ice cleared regularly. Use sand or salt on walking paths during winter. Clean up any spills in your garage right away. This includes oil or grease spills. What can I do in the bathroom? Use night lights. Install grab bars by the toilet and in the tub and shower. Do not use towel bars as grab bars. Use non-skid mats or decals in  the tub or shower. If you need to sit down in the shower, use a plastic, non-slip stool. Keep the floor dry. Clean up any water that spills on the floor as soon as it happens. Remove soap buildup in the tub or shower regularly. Attach bath mats securely with double-sided non-slip rug tape. Do not have throw rugs and other things on the floor that can make you trip. What can I do in the bedroom? Use night lights. Make sure that you have a light by your bed that is easy to reach. Do not use any sheets or blankets that are too big for your bed. They should not hang down onto the floor. Have a firm chair that has side arms. You can use this for support while you get dressed. Do not have throw rugs and other things on the floor that can make you trip. What can I do in the kitchen? Clean up any spills right away. Avoid walking on wet floors. Keep items that you use a lot in easy-to-reach places. If you need to reach something above you, use a strong step stool that has a  grab bar. Keep electrical cords out of the way. Do not use floor polish or wax that makes floors slippery. If you must use wax, use non-skid floor wax. Do not have throw rugs and other things on the floor that can make you trip. What can I do with my stairs? Do not leave any items on the stairs. Make sure that there are handrails on both sides of the stairs and use them. Fix handrails that are broken or loose. Make sure that handrails are as long as the stairways. Check any carpeting to make sure that it is firmly attached to the stairs. Fix any carpet that is loose or worn. Avoid having throw rugs at the top or bottom of the stairs. If you do have throw rugs, attach them to the floor with carpet tape. Make sure that you have a light switch at the top of the stairs and the bottom of the stairs. If you do not have them, ask someone to add them for you. What else can I do to help prevent falls? Wear shoes that: Do not have high  heels. Have rubber bottoms. Are comfortable and fit you well. Are closed at the toe. Do not wear sandals. If you use a stepladder: Make sure that it is fully opened. Do not climb a closed stepladder. Make sure that both sides of the stepladder are locked into place. Ask someone to hold it for you, if possible. Clearly mark and make sure that you can see: Any grab bars or handrails. First and last steps. Where the edge of each step is. Use tools that help you move around (mobility aids) if they are needed. These include: Canes. Walkers. Scooters. Crutches. Turn on the lights when you go into a dark area. Replace any light bulbs as soon as they burn out. Set up your furniture so you have a clear path. Avoid moving your furniture around. If any of your floors are uneven, fix them. If there are any pets around you, be aware of where they are. Review your medicines with your doctor. Some medicines can make you feel dizzy. This can increase your chance of falling. Ask your doctor what other things that you can do to help prevent falls. This information is not intended to replace advice given to you by your health care provider. Make sure you discuss any questions you have with your health care provider. Document Released: 11/13/2008 Document Revised: 06/25/2015 Document Reviewed: 02/21/2014 Elsevier Interactive Patient Education  2017 [employer].

## 2022-04-19 NOTE — Progress Notes (Signed)
I connected with  Jasmine Webb on 04/19/22 by a audio enabled telemedicine application and verified that I am speaking with the correct person using two identifiers.  Patient Location: Home  Provider Location: Office/Clinic  I discussed the limitations of evaluation and management by telemedicine. The patient expressed understanding and agreed to proceed.  Subjective:   Jasmine Webb is a 67 y.o. female who presents for Medicare Annual (Subsequent) preventive examination.  Review of Systems     Cardiac Risk Factors include: advanced age (>69men, >43 women);hypertension;obesity (BMI >30kg/m2)     Objective:    Today's Vitals   04/19/22 1527  PainSc: 0-No pain   There is no height or weight on file to calculate BMI.     04/19/2022    3:30 PM 01/19/2021    9:03 AM 11/15/2020    8:04 AM 11/13/2020    8:45 PM 01/13/2020    1:01 PM  Advanced Directives  Does Patient Have a Medical Advance Directive? No No No No Yes  Type of Personnel officer;Living will  Copy of [place] in Chart?     No - copy requested  Would patient like information on creating a medical advance directive? No - Patient declined No - Patient declined No - Patient declined      Current Medications (verified) Outpatient Encounter Medications as of 04/19/2022  Medication Sig   apixaban (ELIQUIS) 5 MG TABS tablet Take 1 tablet (5 mg total) by mouth 2 (two) times daily.   atorvastatin (LIPITOR) 40 MG tablet Take 1 tablet (40 mg total) by mouth daily.   fexofenadine (ALLEGRA ALLERGY) 180 MG tablet Take 1 tablet (180 mg total) by mouth daily.   fluticasone (FLONASE) 50 MCG/ACT nasal spray Place 2 sprays into both nostrils daily.   furosemide (LASIX) 20 MG tablet TAKE 1 TABLET BY MOUTH TWICE A DAY AS NEEDED FOR EDEMA   levocetirizine (XYZAL) 5 MG tablet TAKE 1 TABLET BY MOUTH EVERY DAY IN THE EVENING   losartan (COZAAR) 100 MG tablet Take 1 tablet (100 mg total) by  mouth daily.   metoprolol tartrate (LOPRESSOR) 25 MG tablet Take 1 tablet (25 mg total) by mouth 2 (two) times daily.   mupirocin ointment (BACTROBAN) 2 % APPLY TO AFFECTED AREA TWICE A DAY   No facility-administered encounter medications on file as of 04/19/2022.    Allergies (verified) Fish allergy and Ibuprofen   History: Past Medical History:  Diagnosis Date   Arthritis    History of emergence delirium    Hyperlipidemia    Hypertension    Tumor    breast and pelvic   Vertigo    Past Surgical History:  Procedure Laterality Date   ABDOMINAL HYSTERECTOMY  2003   BREAST SURGERY     CHOLECYSTECTOMY     PULMONARY THROMBECTOMY N/A 10/10/2019   Procedure: PULMONARY THROMBECTOMY;  Surgeon: Algernon Huxley, MD;  Location: [facility];  Service: Cardiovascular;  Laterality: N/A;   Family History  Problem Relation Age of Onset   COPD Mother    ALS Father    Heart disease Sister    Breast cancer Sister    Colon cancer Sister    Diabetes Brother    Hypertension Brother    Asthma Son    Sinusitis Son    Congestive Heart Failure Maternal Uncle    Social History   Socioeconomic History   Marital status: Divorced    Spouse name: Not  on file   Number of children: Not on file   Years of education: Not on file   Highest education level: Not on file  Occupational History   Not on file  Tobacco Use   Smoking status: Never   Smokeless tobacco: Never  Vaping Use   Vaping Use: Never used  Substance and Sexual Activity   Alcohol use: Never   Drug use: Never   Sexual activity: Not Currently  Other Topics Concern   Not on file  Social History Narrative   Not on file   Social Determinants of Health   Financial Resource Strain: Low Risk  (04/19/2022)   Overall Financial Resource Strain (CARDIA)    Difficulty of Paying Living Expenses: Not hard at all  Food Insecurity: No Food Insecurity (04/19/2022)   Hunger Vital Sign    Worried About Running Out of Food in the Last  Year: Never true    [place] in the Last Year: Never true  Transportation Needs: No Transportation Needs (04/19/2022)   PRAPARE - Hydrologist (Medical): No    Lack of Transportation (Non-Medical): No  Physical Activity: Sufficiently Active (04/19/2022)   Exercise Vital Sign    Days of Exercise per Week: 7 days    Minutes of Exercise per Session: 60 min  Stress: No Stress Concern Present (04/19/2022)   [place]    Feeling of Stress : Not at all  Social Connections: Moderately Isolated (04/19/2022)   Social Connection and Isolation Panel [NHANES]    Frequency of Communication with Friends and Family: More than three times a week    Frequency of Social Gatherings with Friends and Family: More than three times a week    Attends Religious Services: More than 4 times per year    Active Member of [employer] or Organizations: No    Attends Music therapist: Never    Marital Status: Divorced    Tobacco Counseling Counseling given: Not Answered   Clinical Intake:  Pre-visit preparation completed: Yes  Pain : No/denies pain Pain Score: 0-No pain     Nutritional Risks: None Diabetes: No  How often do you need to have someone help you when you read instructions, pamphlets, or other written materials from your doctor or pharmacy?: 1 - Never  Diabetic?no  Interpreter Needed?: No  Information entered by :: Kirke Shaggy, LPN   Activities of Daily Living    04/19/2022    3:31 PM 08/09/2021    9:55 AM  In your present state of health, do you have any difficulty performing the following activities:  Hearing? 0 0  Vision? 0 0  Difficulty concentrating or making decisions? 0 0  Walking or climbing stairs? 1 0  Comment arthritis in knees   Dressing or bathing? 0 0  Doing errands, shopping? 0 0  Preparing Food and eating ? N   Using the Toilet? N   In the past six months,  have you accidently leaked urine? N   Do you have problems with loss of bowel control? N   Managing your Medications? N   Managing your Finances? N   Housekeeping or managing your Housekeeping? N     Patient Care Team: Venita Lick, NP as PCP - General (Nurse Practitioner) Wellington Hampshire, MD as PCP - Cardiology (Cardiology)  Indicate any recent Medical Services you may have received from other than [facility] providers in the past  year (date may be approximate).     Assessment:   This is a routine wellness examination for [place].  Hearing/Vision screen Hearing Screening - Comments:: No aids Vision Screening - Comments:: readers  Dietary issues and exercise activities discussed: Current Exercise Habits: Home exercise routine, Type of exercise: walking, Time (Minutes): 60, Frequency (Times/Week): 7, Weekly Exercise (Minutes/Week): 420, Intensity: Mild   Goals Addressed             This Visit's Progress    DIET - EAT MORE FRUITS AND VEGETABLES         Depression Screen    04/19/2022    3:29 PM 03/28/2022   11:05 AM 04/01/2021    9:13 AM 02/08/2021    3:52 PM 01/19/2021    9:13 AM 06/23/2020   10:29 AM 01/13/2020    1:02 PM  PHQ 2/9 Scores  PHQ - 2 Score 0 0 0 0 0 0 0  PHQ- 9 Score 0 0 0 0  0     Fall Risk    04/19/2022    3:31 PM 04/01/2021    9:13 AM 01/19/2021    9:03 AM 01/13/2020    1:01 PM 05/22/2019    3:10 PM  Fall Risk   Falls in the past year? 0 0 0 0 0  Number falls in past yr: 0 0 0  0  Injury with Fall? 0 0 0  0  Risk for fall due to : No Fall Risks No Fall Risks  Medication side effect   Follow up Falls prevention discussed;Falls evaluation completed Falls evaluation completed Falls evaluation completed;Falls prevention discussed Falls evaluation completed;Education provided Falls evaluation completed    FALL RISK PREVENTION PERTAINING TO THE HOME:  Any stairs in or around the home? No  If so, are there any without handrails? No  Home free of loose  throw rugs in walkways, pet beds, electrical cords, etc? Yes  Adequate lighting in your home to reduce risk of falls? Yes   ASSISTIVE DEVICES UTILIZED TO PREVENT FALLS:  Life alert? Yes  Use of a cane, walker or w/c? Yes - cane PRN Grab bars in the bathroom? Yes  Shower chair or bench in shower? No  Elevated toilet seat or a handicapped toilet? Yes    Cognitive Function:        04/19/2022    3:35 PM 01/13/2020    1:04 PM  6CIT Screen  What Year? 0 points 0 points  What month? 0 points 0 points  What time? 0 points 0 points  Count back from 20 0 points 0 points  Months in reverse 0 points 2 points  Repeat phrase 2 points 2 points  Total Score 2 points 4 points    Immunizations Immunization History  Administered Date(s) Administered   Pneumococcal Conjugate-13 02/08/2021   Tdap 06/23/2020   Zoster Recombinat (Shingrix) 12/04/2021, 02/11/2022    TDAP status: Up to date  Flu Vaccine status: Declined, Education has been provided regarding the importance of this vaccine but patient still declined. Advised may receive this vaccine at local pharmacy or Health Dept. Aware to provide a copy of the vaccination record if obtained from local pharmacy or Health Dept. Verbalized acceptance and understanding.  Pneumococcal vaccine status: Up to date  Covid-19 vaccine status: Declined, Education has been provided regarding the importance of this vaccine but patient still declined. Advised may receive this vaccine at local pharmacy or Health Dept.or vaccine clinic. Aware to provide a copy of the vaccination  record if obtained from local pharmacy or Health Dept. Verbalized acceptance and understanding.  Qualifies for Shingles Vaccine? Yes   Zostavax completed No   Shingrix Completed?: Yes  Screening Tests Health Maintenance  Topic Date Due   Pneumonia Vaccine 18+ Years old (2 of 2 - PPSV23 or PCV20) 02/08/2022   INFLUENZA VACCINE  05/01/2022 (Originally 08/31/2021)   MAMMOGRAM   08/10/2022 (Originally 12/26/2003)   DEXA SCAN  08/10/2022 (Originally 12/26/2018)   Fecal DNA (Cologuard)  05/08/2022   Medicare Annual Wellness (AWV)  04/19/2023   DTaP/Tdap/Td (2 - Td or Tdap) 06/24/2030   Hepatitis C Screening  Completed   Zoster Vaccines- Shingrix  Completed   HPV VACCINES  Aged Out   COVID-19 Vaccine  Discontinued    Health Maintenance  Health Maintenance Due  Topic Date Due   Pneumonia Vaccine 32+ Years old (2 of 2 - PPSV23 or PCV20) 02/08/2022    Colorectal cancer screening: Type of screening: Cologuard. Completed 05/08/19. Repeat every 3 years  Declined mammogram referral  Declined BDS for now  Lung Cancer Screening: (Low Dose CT Chest recommended if Age 90-80 years, 30 pack-year currently smoking OR have quit w/in 15years.) does not qualify.    Additional Screening:  Hepatitis C Screening: does qualify; Completed 06/23/20  Vision Screening: Recommended annual ophthalmology exams for early detection of glaucoma and other disorders of the eye. Is the patient up to date with their annual eye exam?  No  Who is the provider or what is the name of the office in which the patient attends annual eye exams? No one If pt is not established with a provider, would they like to be referred to a provider to establish care? No .   Dental Screening: Recommended annual dental exams for proper oral hygiene  Community Resource Referral / Chronic Care Management: CRR required this visit?  No   CCM required this visit?  No      Plan:     I have personally reviewed and noted the following in the patient's chart:   Medical and social history Use of alcohol, tobacco or illicit drugs  Current medications and supplements including opioid prescriptions. Patient is not currently taking opioid prescriptions. Functional ability and status Nutritional status Physical activity Advanced directives List of other physicians Hospitalizations, surgeries, and ER visits in  previous 12 months Vitals Screenings to include cognitive, depression, and falls Referrals and appointments  In addition, I have reviewed and discussed with patient certain preventive protocols, quality metrics, and best practice recommendations. A written personalized care plan for preventive services as well as general preventive health recommendations were provided to patient.     Dionisio David, LPN   [id number]   Nurse Notes: none

## 2022-05-02 ENCOUNTER — Ambulatory Visit: Payer: Self-pay | Admitting: *Deleted

## 2022-05-02 NOTE — Telephone Encounter (Signed)
  Chief Complaint: infected toe Symptoms: pain, redness Frequency: started Sunday Pertinent Negatives: Patient denies leg pain, rash, fever, numbness  Disposition: [] ED /[] Urgent Care (no appt availability in office) / [x] Appointment(In office/virtual)/ []  [facility]/ [] Home Care/ [] Refused Recommended Disposition /[] [facility]/ []  Follow-up with PCP Additional Notes: Patient states she removed ingrown toenail and now she believes she has infection starting. Appointment has been scheduled and care advised given

## 2022-05-02 NOTE — Telephone Encounter (Signed)
Reason for Disposition  Looks like a boil, infected sore, or deep ulcer  Answer Assessment - Initial Assessment Questions 1. ONSET: "When did the pain start?"      Started Sunday 2. LOCATION: "Where is the pain located?"   (e.g., around nail, entire toe, at foot joint)      R great toe 3. PAIN: "How bad is the pain?"    (Scale 1-10; or mild, moderate, severe)   -  MILD (1-3): doesn't interfere with normal activities    -  MODERATE (4-7): interferes with normal activities (e.g., work or school) or awakens from sleep, limping    -  SEVERE (8-10): excruciating pain, unable to do any normal activities, unable to walk     moderate 4. APPEARANCE: "What does the toe look like?" (e.g., redness, swelling, bruising, pallor)     Red around ingrown toenail- toe swelling 5. CAUSE: "What do you think is causing the toe pain?"     infection 6. OTHER SYMPTOMS: "Do you have any other symptoms?" (e.g., leg pain, rash, fever, numbness)     no  Protocols used: Toe Pain-A-AH

## 2022-05-03 ENCOUNTER — Encounter: Payer: Self-pay | Admitting: Nurse Practitioner

## 2022-05-03 ENCOUNTER — Ambulatory Visit (INDEPENDENT_AMBULATORY_CARE_PROVIDER_SITE_OTHER): Payer: Medicare Other | Admitting: Nurse Practitioner

## 2022-05-03 ENCOUNTER — Other Ambulatory Visit: Payer: Self-pay | Admitting: Nurse Practitioner

## 2022-05-03 VITALS — BP 117/66 | HR 69 | Temp 97.7°F | Wt 292.6 lb

## 2022-05-03 DIAGNOSIS — L600 Ingrowing nail: Secondary | ICD-10-CM | POA: Diagnosis not present

## 2022-05-03 MED ORDER — CEPHALEXIN 500 MG PO CAPS: 500.0000 mg | ORAL_CAPSULE | Freq: Four times a day (QID) | ORAL | 0 refills | Status: DC

## 2022-05-03 NOTE — Progress Notes (Signed)
BP 117/66   Pulse 69   Temp 97.7 F (36.5 C) (Oral)   Wt 292 lb 9.6 oz (132.7 kg)   LMP  (LMP Unknown)   SpO2 97%   BMI 51.83 kg/m    Subjective:    Patient ID: Jasmine Webb, female    DOB: [birth year], 67 y.o.   MRN: [id number]  HPI: Jasmine Webb is a 66 y.o. female  Chief Complaint  Patient presents with   Toe Pain    Pt states she started having toe pain Sunday. States she has had an ingrown their before and healed but is not hurting again.    TOE PAIN Duration: days Involved toe: rightbig toe  Mechanism of injury: unknown Onset: gradual Severity: 5/10  Quality:  sharp Frequency: intermittent Radiation: no Aggravating factors: weight bearing and walking  Alleviating factors:  tylenol   Status: worse Treatments attempted:  tylenol   Relief with NSAIDs?: No NSAIDs Taken Morning stiffness: no Redness: yes  Bruising: yes Swelling: yes Paresthesias / decreased sensation: no Fevers: no  Relevant past medical, surgical, family and social history reviewed and updated as indicated. Interim medical history since our last visit reviewed. Allergies and medications reviewed and updated.  Review of Systems  Musculoskeletal:        Right great toe pain and swelling    Per HPI unless specifically indicated above     Objective:    BP 117/66   Pulse 69   Temp 97.7 F (36.5 C) (Oral)   Wt 292 lb 9.6 oz (132.7 kg)   LMP  (LMP Unknown)   SpO2 97%   BMI 51.83 kg/m   Wt Readings from Last 3 Encounters:  05/03/22 292 lb 9.6 oz (132.7 kg)  04/19/22 288 lb (130.6 kg)  03/28/22 288 lb 8 oz (130.9 kg)    Physical Exam Vitals and nursing note reviewed.  Constitutional:      General: She is not in acute distress.    Appearance: Normal appearance. She is normal weight. She is not ill-appearing, toxic-appearing or diaphoretic.  HENT:     Head: Normocephalic.     Right Ear: External ear normal.     Left Ear: External ear normal.     Nose: Nose normal.      Mouth/Throat:     Mouth: Mucous membranes are moist.     Pharynx: Oropharynx is clear.  Eyes:     General:        Right eye: No discharge.        Left eye: No discharge.     Extraocular Movements: Extraocular movements intact.     Conjunctiva/sclera: Conjunctivae normal.     Pupils: Pupils are equal, round, and reactive to light.  Cardiovascular:     Rate and Rhythm: Normal rate and regular rhythm.     Heart sounds: No murmur heard. Pulmonary:     Effort: Pulmonary effort is normal. No respiratory distress.     Breath sounds: Normal breath sounds. No wheezing or rales.  Musculoskeletal:     Cervical back: Normal range of motion and neck supple.       Feet:  Skin:    General: Skin is warm and dry.     Capillary Refill: Capillary refill takes less than 2 seconds.  Neurological:     General: No focal deficit present.     Mental Status: She is alert and oriented to person, place, and time. Mental status is at baseline.  Psychiatric:  Mood and Affect: Mood normal.        Behavior: Behavior normal.        Thought Content: Thought content normal.        Judgment: Judgment normal.     Results for orders placed or performed in visit on 03/28/22  Microalbumin, Urine Waived  Result Value Ref Range   Microalb, Ur Waived 10 0 - 19 mg/L   Creatinine, Urine Waived 50 10 - 300 mg/dL   Microalb/Creat Ratio <30 <30 mg/g  CBC with Differential/Platelet  Result Value Ref Range   WBC 7.1 3.4 - 10.8 x10E3/uL   RBC 4.78 3.77 - 5.28 x10E6/uL   Hemoglobin 13.4 11.1 - 15.9 g/dL   Hematocrit 41.7 34.0 - 46.6 %   MCV 87 79 - 97 fL   MCH 28.0 26.6 - 33.0 pg   MCHC 32.1 31.5 - 35.7 g/dL   RDW 12.8 11.7 - 15.4 %   Platelets 203 150 - 450 x10E3/uL   Neutrophils 72 Not Estab. %   Lymphs 19 Not Estab. %   Monocytes 6 Not Estab. %   Eos 3 Not Estab. %   Basos 0 Not Estab. %   Neutrophils Absolute 5.0 1.4 - 7.0 x10E3/uL   Lymphocytes Absolute 1.4 0.7 - 3.1 x10E3/uL   Monocytes Absolute  0.4 0.1 - 0.9 x10E3/uL   EOS (ABSOLUTE) 0.2 0.0 - 0.4 x10E3/uL   Basophils Absolute 0.0 0.0 - 0.2 x10E3/uL   Immature Granulocytes 0 Not Estab. %   Immature Grans (Abs) 0.0 0.0 - 0.1 x10E3/uL  Comprehensive metabolic panel  Result Value Ref Range   Glucose 96 70 - 99 mg/dL   BUN 13 8 - 27 mg/dL   Creatinine, Ser 0.68 0.57 - 1.00 mg/dL   eGFR 95 >59 mL/min/1.73   BUN/Creatinine Ratio 19 12 - 28   Sodium 144 134 - 144 mmol/L   Potassium 3.8 3.5 - 5.2 mmol/L   Chloride 102 96 - 106 mmol/L   CO2 25 20 - 29 mmol/L   Calcium 9.4 8.7 - 10.3 mg/dL   Total Protein 6.7 6.0 - 8.5 g/dL   Albumin 4.3 3.9 - 4.9 g/dL   Globulin, Total 2.4 1.5 - 4.5 g/dL   Albumin/Globulin Ratio 1.8 1.2 - 2.2   Bilirubin Total 1.3 (H) 0.0 - 1.2 mg/dL   Alkaline Phosphatase 96 44 - 121 IU/L   AST 16 0 - 40 IU/L   ALT 14 0 - 32 IU/L  Lipid Panel w/o Chol/HDL Ratio  Result Value Ref Range   Cholesterol, Total 133 100 - 199 mg/dL   Triglycerides 121 0 - 149 mg/dL   HDL 48 >39 mg/dL   VLDL Cholesterol Cal 22 5 - 40 mg/dL   LDL Chol Calc (NIH) 63 0 - 99 mg/dL  VITAMIN D 25 Hydroxy (Vit-D Deficiency, Fractures)  Result Value Ref Range   Vit D, 25-Hydroxy 46.6 30.0 - 100.0 ng/mL  Bayer DCA Hb A1c Waived  Result Value Ref Range   HB A1C (BAYER DCA - WAIVED) 5.9 (H) 4.8 - 5.6 %      Assessment & Plan:   Problem List Items Addressed This Visit   None Visit Diagnoses     Ingrown toenail with infection    -  Primary   Will treat with Keflex.  If not improved, will need to come back to see Dr. Wynetta Emery for toenail removal.   Relevant Medications   cephALEXin (KEFLEX) 500 MG capsule  Follow up plan: Return if symptoms worsen or fail to improve.

## 2022-05-03 NOTE — Telephone Encounter (Signed)
Requested by interface surescripts. Future visit in 4 months . Requested Prescriptions  Pending Prescriptions Disp Refills   levocetirizine (XYZAL) 5 MG tablet [Pharmacy Med Name: LEVOCETIRIZINE 5 MG TABLET] 90 tablet 0    Sig: TAKE 1 TABLET BY MOUTH EVERY DAY IN THE EVENING     Ear, Nose, and Throat:  Antihistamines - levocetirizine dihydrochloride Passed - 05/03/2022  1:41 PM      Passed - Cr in normal range and within 360 days    Creatinine, Ser  Date Value Ref Range Status  03/28/2022 0.68 0.57 - 1.00 mg/dL Final         Passed - eGFR is 10 or above and within 360 days    GFR calc Af Amer  Date Value Ref Range Status  01/07/2020 111 >59 mL/min/1.73 Final    Comment:    **In accordance with recommendations from the NKF-ASN Task force,**   [facility] is in the process of updating its eGFR calculation to the   2021 CKD-EPI creatinine equation that estimates kidney function   without a race variable.    GFR, Estimated  Date Value Ref Range Status  11/13/2020 >60 >60 mL/min Final    Comment:    (NOTE) Calculated using the CKD-EPI Creatinine Equation (2021)    eGFR  Date Value Ref Range Status  03/28/2022 95 >59 mL/min/1.73 Final         Passed - Valid encounter within last 12 months    Recent Outpatient Visits           Today Ingrown toenail with infection   [place], Karen, NP   1 month ago Morbid obesity due to excess calories ([place])   [place], [place] T, NP   7 months ago Morbid obesity due to excess calories ([place])   [place], Henrine Screws T, NP   8 months ago SVT (supraventricular tachycardia) ([place])   [place], Henrine Screws T, NP   8 months ago SVT (supraventricular tachycardia) ([place])   [place], Barbaraann Faster, NP       Future Appointments             In 4 months [place], Barbaraann Faster, NP [place], [facility]

## 2022-05-17 LAB — COLOGUARD

## 2022-05-27 ENCOUNTER — Ambulatory Visit: Payer: Medicare Other | Admitting: Nurse Practitioner

## 2022-06-03 ENCOUNTER — Encounter: Payer: Self-pay | Admitting: Nurse Practitioner

## 2022-06-03 ENCOUNTER — Ambulatory Visit (INDEPENDENT_AMBULATORY_CARE_PROVIDER_SITE_OTHER): Payer: Medicare Other | Admitting: Nurse Practitioner

## 2022-06-03 VITALS — BP 119/81 | HR 89 | Temp 97.5°F | Ht 62.99 in | Wt 290.1 lb

## 2022-06-03 DIAGNOSIS — L600 Ingrowing nail: Secondary | ICD-10-CM

## 2022-06-03 MED ORDER — MUPIROCIN 2 % EX OINT: TOPICAL_OINTMENT | CUTANEOUS | 0 refills | Status: DC

## 2022-06-03 MED ORDER — CEPHALEXIN 500 MG PO CAPS: 500.0000 mg | ORAL_CAPSULE | Freq: Four times a day (QID) | ORAL | 0 refills | Status: AC

## 2022-06-03 NOTE — Progress Notes (Signed)
BP 119/81   Pulse 89   Temp (!) 97.5 F (36.4 C) (Oral)   Ht 5' 2.99" (1.6 m)   Wt 290 lb 1.6 oz (131.6 kg)   LMP  (LMP Unknown)   SpO2 95%   BMI 51.40 kg/m    Subjective:    Patient ID: Jasmine Webb, female    DOB: May 22, 1955, 67 y.o.   MRN: [id number]  HPI: Jasmine Webb is a 67 y.o. female  Chief Complaint  Patient presents with   Nail Problem    Right great toe nail for at least the past 6 weeks   TOENAIL ISSUES: Had an ingrown toenail, that was taken out on own but then got infected - was placed on Keflex in office 05/03/22 but now she is concerned ingrown toenail has returned.  Was improved after abx use for weeks. Then started to keep getting it stepped on by grandchildren and a steel-toed boot.  Started getting tender, 5-6/10, throbbing/aching/sharp.  Area is not swollen or red per her report.  No drainage from area.  Pain is intermittent.  Taking Tylenol for pain.    Relevant past medical, surgical, family and social history reviewed and updated as indicated. Interim medical history since our last visit reviewed. Allergies and medications reviewed and updated.  Review of Systems  Constitutional:  Negative for activity change, appetite change, diaphoresis, fatigue and fever.  Respiratory:  Negative for cough, chest tightness and shortness of breath.   Cardiovascular:  Negative for chest pain, palpitations and leg swelling.  Musculoskeletal:  Positive for arthralgias.  Neurological: Negative.   Psychiatric/Behavioral: Negative.     Per HPI unless specifically indicated above     Objective:    BP 119/81   Pulse 89   Temp (!) 97.5 F (36.4 C) (Oral)   Ht 5' 2.99" (1.6 m)   Wt 290 lb 1.6 oz (131.6 kg)   LMP  (LMP Unknown)   SpO2 95%   BMI 51.40 kg/m   Wt Readings from Last 3 Encounters:  06/03/22 290 lb 1.6 oz (131.6 kg)  05/03/22 292 lb 9.6 oz (132.7 kg)  04/19/22 288 lb (130.6 kg)    Physical Exam Vitals and nursing note reviewed.  Constitutional:       General: She is awake. She is not in acute distress.    Appearance: She is well-developed and well-groomed. She is obese. She is not ill-appearing or toxic-appearing.  HENT:     Head: Normocephalic.     Right Ear: Hearing and external ear normal.     Left Ear: Hearing and external ear normal.  Eyes:     General: Lids are normal.        Right eye: No discharge.        Left eye: No discharge.     Conjunctiva/sclera: Conjunctivae normal.     Pupils: Pupils are equal, round, and reactive to light.  Neck:     Vascular: No carotid bruit.  Cardiovascular:     Rate and Rhythm: Normal rate and regular rhythm.     Pulses:          Dorsalis pedis pulses are 1+ on the right side and 1+ on the left side.       Posterior tibial pulses are 1+ on the right side and 1+ on the left side.     Heart sounds: Normal heart sounds. No murmur heard.    No gallop.  Pulmonary:     Effort: Pulmonary effort is normal. No  accessory muscle usage or respiratory distress.     Breath sounds: Normal breath sounds.  Abdominal:     General: Bowel sounds are normal.     Palpations: Abdomen is soft.  Musculoskeletal:     Cervical back: Normal range of motion and neck supple.     Right lower leg: No edema.     Left lower leg: No edema.     Right foot: Normal range of motion.     Left foot: Normal range of motion.  Feet:     Right foot:     Protective Sensation: 10 sites tested.  10 sites sensed.     Toenail Condition: Right toenails are abnormally thick and ingrown.     Left foot:     Protective Sensation: 10 sites tested.  10 sites sensed.     Toenail Condition: Left toenails are abnormally thick.     Comments: Right great toe with ingrown toenail noted, tender to medial aspect great toe with dry blood.  Mild edema and erythema to area.  Very tender to touch. Skin:    General: Skin is warm and dry.  Neurological:     Mental Status: She is alert and oriented to person, place, and time.  Psychiatric:         Attention and Perception: Attention normal.        Mood and Affect: Mood normal.        Speech: Speech normal.        Behavior: Behavior normal. Behavior is cooperative.        Thought Content: Thought content normal.    Results for orders placed or performed in visit on 04/19/22  Cologuard  Result Value Ref Range   COLOGUARD No Result Obtained 3 N/A      Assessment & Plan:   Problem List Items Addressed This Visit       Musculoskeletal and Integument   Ingrown right greater toenail - Primary    Acute, she initially removed on own at home, then became infected and now appears to have returned.  Will start Keflex for infection QID and Mupirocin ointment. Recommend Tylenol as needed + apply ice as needed and elevate.  To return on Monday with Dr. Laural Benes for removal.        Follow up plan: Return for Monday the 3rd at 4 pm.

## 2022-06-03 NOTE — Patient Instructions (Signed)
Ingrown Toenail  An ingrown toenail occurs when the corner or sides of a toenail grow into the surrounding skin. This causes discomfort and pain. The big toe is most commonly affected, but any of the toes can be affected. If an ingrown toenail is not treated, it can become infected. What are the causes? This condition may be caused by: Wearing shoes that are too small or tight. An injury, such as stubbing your toe or having your toe stepped on. Improper cutting or care of your toenails. Having nail or foot abnormalities that were present from birth (congenital abnormalities), such as having a nail that is too big for your toe. What increases the risk? The following factors may make you more likely to develop ingrown toenails: Age. Nails tend to get thicker with age, so ingrown nails are more common among older people. Cutting your toenails incorrectly, such as cutting them very short or cutting them unevenly. An ingrown toenail is more likely to get infected if you have: Diabetes. Blood flow (circulation) problems. What are the signs or symptoms? Symptoms of an ingrown toenail may include: Pain, soreness, or tenderness. Redness. Swelling. Hardening of the skin that surrounds the toenail. Signs that an ingrown toenail may be infected include: Fluid or pus. Symptoms that get worse. How is this diagnosed? Ingrown toenails may be diagnosed based on: Your symptoms and medical history. A physical exam. Labs or tests. If you have fluid or blood coming from your toenail, a sample may be collected to test for the specific type of bacteria that is causing the infection. How is this treated? Treatment depends on the severity of your symptoms. You may be able to care for your toenail at home. If you have an infection, you may be prescribed antibiotic medicines. If you have fluid or pus draining from your toenail, your health care provider may drain it. If you have trouble walking, you may be  given crutches to use. If you have a severe or infected ingrown toenail, you may need a procedure to remove part or all of the nail. Follow these instructions at home: Foot care  Check your wound every day for signs of infection, or as often as told by your health care provider. Check for: More redness, swelling, or pain. More fluid or blood. Warmth. Pus or a bad smell. Do not pick at your toenail or try to remove it yourself. Soak your foot in warm, soapy water. Do this for 20 minutes, 3 times a day, or as often as told by your health care provider. This helps to keep your toe clean and your skin soft. Wear shoes that fit well and are not too tight. Your health care provider may recommend that you wear open-toed shoes while you heal. Trim your toenails regularly and carefully. Cut your toenails straight across to prevent injury to the skin at the corners of the toenail. Do not cut your nails in a curved shape. Keep your feet clean and dry to help prevent infection. General instructions Take over-the-counter and prescription medicines only as told by your health care provider. If you were prescribed an antibiotic, take it as told by your health care provider. Do not stop taking the antibiotic even if you start to feel better. If your health care provider told you to use crutches to help you move around, use them as instructed. Return to your normal activities as told by your health care provider. Ask your health care provider what activities are safe for you.   Keep all follow-up visits. This is important. Contact a health care provider if: You have more redness, swelling, pain, or other symptoms that do not improve with treatment. You have fluid, blood, or pus coming from your toenail. You have a red streak on your skin that starts at your foot and spreads up your leg. You have a fever. Summary An ingrown toenail occurs when the corner or sides of a toenail grow into the surrounding skin.  This causes discomfort and pain. The big toe is most commonly affected, but any of the toes can be affected. If an ingrown toenail is not treated, it can become infected. Fluid or pus draining from your toenail is a sign of infection. Your health care provider may need to drain it. You may be given antibiotics to treat the infection. Trimming your toenails regularly and properly can help you prevent an ingrown toenail. This information is not intended to replace advice given to you by your health care provider. Make sure you discuss any questions you have with your health care provider. Document Revised: 05/19/2020 Document Reviewed: 05/19/2020 Elsevier Patient Education  2023 [employer] Inc[employer]  

## 2022-06-03 NOTE — Assessment & Plan Note (Signed)
Acute, she initially removed on own at home, then became infected and now appears to have returned.  Will start Keflex for infection QID and Mupirocin ointment. Recommend Tylenol as needed + apply ice as needed and elevate.  To return on Monday with Dr. Laural Benes for removal.

## 2022-06-06 ENCOUNTER — Ambulatory Visit (INDEPENDENT_AMBULATORY_CARE_PROVIDER_SITE_OTHER): Payer: Medicare Other | Admitting: Family Medicine

## 2022-06-06 ENCOUNTER — Encounter: Payer: Self-pay | Admitting: Family Medicine

## 2022-06-06 VITALS — BP 118/82 | HR 116 | Temp 98.2°F | Ht 62.0 in | Wt 295.3 lb

## 2022-06-06 DIAGNOSIS — L600 Ingrowing nail: Secondary | ICD-10-CM

## 2022-06-06 NOTE — Assessment & Plan Note (Signed)
Toenail removed today with good results.

## 2022-06-06 NOTE — Progress Notes (Signed)
BP 118/82   Pulse (!) 116   Temp 98.2 F (36.8 C) (Oral)   Ht 5\' 2"  (1.575 m)   Wt 295 lb 4.8 oz (133.9 kg)   LMP  (LMP Unknown)   SpO2 96%   BMI 54.01 kg/m    Subjective:    Patient ID: Jasmine Webb, female    DOB: 09-Mar-1955, 67 y.o.   MRN: [id number]  HPI: Jasmine Webb is a 67 y.o. female  Chief Complaint  Patient presents with   Toe Pain   TOE PAIN Duration: months Involved toe: rightbig toe  Mechanism of injury: trauma Onset: gradual Severity: moderate  Quality: aching and sore Frequency: intermittent Radiation: none Aggravating factors: walking  Alleviating factors: rest  Status: worse Treatments attempted: antibiotics  Relief with NSAIDs?: no Morning stiffness: no Redness: yes  Bruising: no Swelling: yes Paresthesias / decreased sensation: no Fevers: no  Relevant past medical, surgical, family and social history reviewed and updated as indicated. Interim medical history since our last visit reviewed. Allergies and medications reviewed and updated.  Review of Systems  Constitutional: Negative.   Respiratory: Negative.    Cardiovascular: Negative.   Gastrointestinal: Negative.   Musculoskeletal: Negative.   Neurological: Negative.   Psychiatric/Behavioral: Negative.      Per HPI unless specifically indicated above     Objective:    BP 118/82   Pulse (!) 116   Temp 98.2 F (36.8 C) (Oral)   Ht 5\' 2"  (1.575 m)   Wt 295 lb 4.8 oz (133.9 kg)   LMP  (LMP Unknown)   SpO2 96%   BMI 54.01 kg/m   Wt Readings from Last 3 Encounters:  06/06/22 295 lb 4.8 oz (133.9 kg)  06/03/22 290 lb 1.6 oz (131.6 kg)  05/03/22 292 lb 9.6 oz (132.7 kg)    Physical Exam Vitals and nursing note reviewed.  Constitutional:      General: She is not in acute distress.    Appearance: Normal appearance. She is not ill-appearing, toxic-appearing or diaphoretic.  HENT:     Head: Normocephalic and atraumatic.     Right Ear: External ear normal.     Left Ear:  External ear normal.     Nose: Nose normal.     Mouth/Throat:     Mouth: Mucous membranes are moist.     Pharynx: Oropharynx is clear.  Eyes:     General: No scleral icterus.       Right eye: No discharge.        Left eye: No discharge.     Extraocular Movements: Extraocular movements intact.     Conjunctiva/sclera: Conjunctivae normal.     Pupils: Pupils are equal, round, and reactive to light.  Cardiovascular:     Rate and Rhythm: Normal rate and regular rhythm.     Pulses: Normal pulses.     Heart sounds: Normal heart sounds. No murmur heard.    No friction rub. No gallop.  Pulmonary:     Effort: Pulmonary effort is normal. No respiratory distress.     Breath sounds: Normal breath sounds. No stridor. No wheezing, rhonchi or rales.  Chest:     Chest wall: No tenderness.  Musculoskeletal:        General: Normal range of motion.     Cervical back: Normal range of motion and neck supple.  Skin:    General: Skin is warm and dry.     Capillary Refill: Capillary refill takes less than 2 seconds.  Coloration: Skin is not jaundiced or pale.     Findings: No bruising, erythema, lesion or rash.     Comments: Ingrown R medial border of great toenail  Neurological:     General: No focal deficit present.     Mental Status: She is alert and oriented to person, place, and time. Mental status is at baseline.  Psychiatric:        Mood and Affect: Mood normal.        Behavior: Behavior normal.        Thought Content: Thought content normal.        Judgment: Judgment normal.     Results for orders placed or performed in visit on 04/19/22  Cologuard  Result Value Ref Range   COLOGUARD No Result Obtained 3 N/A      Assessment & Plan:   Problem List Items Addressed This Visit       Musculoskeletal and Integument   Ingrown right greater toenail - Primary    Toenail removed today with good results.        Procedure: Partial Toenail removal with Matrix Diagnosis:    ICD-10-CM    1. Ingrown right greater toenail  L60.0      Physican: Olevia Perches, DO Consent: Risks, benefits, and alternative treatments discussed and all questions were answered.  Patient elected to proceed and verbal consent obtained Description:  Area prepped and draped using  sterile technique. Digital block of the  Right  1st toe performed by injecting local anesthetic at the base of the toe at the 2 oclock and 10 oclock positions, using 10 cc's of  1% lidocaine plain. After confirming adequate anesthesia, medial nail folds and epinychia were freed up using periosteal elevator.  Using scissors the nail was vertically cut beyond the epinychia to the base.  A hemostat was then used to remove the nail fragment. The nail was grasped using a hemostat and the nail was removed intact. Phenol was applied to the nail matrix x 3 using a cotton applicator tip.   Bacitracin ointment was applied to the operative site a circumferential gauze dressive was applied.  The patient tolerated the procedure well.  Complications: none Estimated Blood Loss: minimal Post Procedure Instructions: The patient was encouraged to keep the dressing in place for 24 hours and keep the foot elevated as much as possible during this time.  After the first day they are instructed to soak the toe in warm water 3 times daily for 3-4 days.  Antibiotic ointment is to be applied daily for 1 week.  The patient was informed that some oozing is to be expected for 1-2 weeks but that they should return immediately for pus, increased pain or redness.  They were instructed to take APAP or motrin as needed for post operative discomfort.    Follow up plan: Return if symptoms worsen or fail to improve.

## 2022-06-13 ENCOUNTER — Ambulatory Visit: Payer: Medicare Other | Admitting: Family Medicine

## 2022-06-17 ENCOUNTER — Ambulatory Visit: Payer: Self-pay | Admitting: *Deleted

## 2022-06-17 DIAGNOSIS — Z1211 Encounter for screening for malignant neoplasm of colon: Secondary | ICD-10-CM

## 2022-06-17 LAB — COLOGUARD

## 2022-06-17 NOTE — Telephone Encounter (Signed)
Reason for Disposition  [1] Caller requesting NON-URGENT health information AND [2] PCP's office is the best resource  Answer Assessment - Initial Assessment Questions 1. REASON FOR CALL or QUESTION: "What is your reason for calling today?" or "How can I best help you?" or "What question do you have that I can help answer?"     Pt returning a call for her Cologuard result.   She had to do it a 2nd time. I gave her the message from [name], NP dated 06/06/2022 that they are having a hard time getting a reading for her.  Protocols used: Information Only Call - No Triage-A-AH

## 2022-06-17 NOTE — Telephone Encounter (Signed)
  Chief Complaint: Pt called in for 2nd Cologuard test result.  I gave her the message from [name], NP dated 06/06/2022 that they are having difficulty getting a reading for her Cologuard test.   Same situation that happened last month with her Cologuard test.    Corrie Dandy is asking if pt wants to have a colonoscopy done?     Pt unsure what she should do.    She was on a strong antibiotic both times she took the 2 Cologuard tests and is wondering if that would alter the test results.   I let her know I would pass the information on to [facility] and see what she advises. Symptoms: N/A Frequency: N/A Pertinent Negatives: Patient denies N/A Disposition: [] ED /[] Urgent Care (no appt availability in office) / [] Appointment(In office/virtual)/ []  [facility]/ [] Home Care/ [] Refused Recommended Disposition /[x] [facility]/ []  Follow-up with PCP Additional Notes: Message sent to [name], NP.    Pt. Wasn't sure if she should do the colonoscopy or try the Cologuard test again later since she was on an antibiotic for both Cologuard tests.

## 2022-06-17 NOTE — Progress Notes (Signed)
Please let Jasmine Webb know they have been unable to get good analysis on her Cologuard and they are recommending she use alternate colorectal cancer screening.  Would she like a referral to GI for colonoscopy placed?

## 2022-06-20 ENCOUNTER — Telehealth: Payer: Self-pay | Admitting: Nurse Practitioner

## 2022-06-20 NOTE — Addendum Note (Signed)
Addended by: Leward Quan A on: 06/20/2022 01:41 PM   Modules accepted: Orders

## 2022-06-20 NOTE — Telephone Encounter (Signed)
Lvm for patient to return call.

## 2022-06-20 NOTE — Telephone Encounter (Signed)
See other phone messages on this

## 2022-06-20 NOTE — Telephone Encounter (Signed)
Returned patient's call and informed her of Jolene's recommendations that she should do a colonoscopy and patient agreed and to place the referral.

## 2022-06-20 NOTE — Addendum Note (Signed)
Addended by: [name] T on: 06/20/2022 03:09 PM   Modules accepted: Orders

## 2022-06-20 NOTE — Telephone Encounter (Signed)
Copied from CRM [id number]. Topic: General - Inquiry >> Jun 20, 2022 11:46 AM Pincus Sanes wrote: Reason for CRM: Pt was calling back to get the results of colo guard test, message left by Delaney Meigs, office states to tell pt they will return call call pt at [phone]

## 2022-06-24 ENCOUNTER — Telehealth: Payer: Self-pay

## 2022-06-24 NOTE — Telephone Encounter (Signed)
Pt left message to schedule colonoscopy please return call  

## 2022-06-28 ENCOUNTER — Telehealth: Payer: Self-pay

## 2022-06-28 NOTE — Telephone Encounter (Signed)
Pt lmovm stating that she will not be available until after 1pm

## 2022-06-28 NOTE — Telephone Encounter (Signed)
Message left for patient to return my call.  

## 2022-06-28 NOTE — Telephone Encounter (Signed)
Pt lmovm requesting call back to schedule screening colonoscopy... It looks like we have been trying to reach her

## 2022-06-29 ENCOUNTER — Telehealth: Payer: Self-pay | Admitting: *Deleted

## 2022-06-29 ENCOUNTER — Telehealth: Payer: Self-pay | Admitting: Cardiovascular Disease

## 2022-06-29 ENCOUNTER — Other Ambulatory Visit: Payer: Self-pay | Admitting: *Deleted

## 2022-06-29 DIAGNOSIS — Z1211 Encounter for screening for malignant neoplasm of colon: Secondary | ICD-10-CM

## 2022-06-29 MED ORDER — NA SULFATE-K SULFATE-MG SULF 17.5-3.13-1.6 GM/177ML PO SOLN: 1.0000 | Freq: Once | ORAL | 0 refills | Status: DC

## 2022-06-29 MED ORDER — PEG 3350-KCL-NABCB-NACL-NASULF 236 G PO SOLR: 4000.0000 mL | Freq: Once | ORAL | 0 refills | Status: AC

## 2022-06-29 NOTE — Telephone Encounter (Signed)
   Pre-operative Risk Assessment    Patient Name: Jasmine Webb  DOB: 1955-11-07 MRN: [id number]      Request for Surgical Clearance    Procedure:   colonoscopy  Date of Surgery:  Clearance 07/20/22                                 Surgeon:  not indicated Surgeon's Group or Practice Name:  [facility] Phone number:  [phone] Fax number:  [phone]   Type of Clearance Requested:   - Pharmacy:  Hold Apixaban (Eliquis) instructions   Type of Anesthesia:  General    Additional requests/questions:    Burnett Sheng   06/29/2022, 4:34 PM

## 2022-06-29 NOTE — Telephone Encounter (Signed)
Gastroenterology Pre-Procedure Review  Request Date: 07/20/2022 Requesting Physician: Dr. Servando Snare  PATIENT REVIEW QUESTIONS: The patient responded to the following health history questions as indicated:    1. Are you having any GI issues? yes (hemorrhoids) 2. Do you have a personal history of Polyps? no 3. Do you have a family history of Colon Cancer or Polyps? yes (mother and sister) 4. Diabetes Mellitus? no 5. Joint replacements in the past 12 months?no 6. Major health problems in the past 3 months?no 7. Any artificial heart valves, MVP, or defibrillator?yes (PE)    MEDICATIONS & ALLERGIES:    Patient reports the following regarding taking any anticoagulation/antiplatelet therapy:   Plavix, Coumadin, Eliquis, Xarelto, Lovenox, Pradaxa, Brilinta, or Effient? yes (Eliquis 5 mg) Aspirin? no  Patient confirms/reports the following medications:  Current Outpatient Medications  Medication Sig Dispense Refill   apixaban (ELIQUIS) 5 MG TABS tablet Take 1 tablet (5 mg total) by mouth 2 (two) times daily. 180 tablet 4   atorvastatin (LIPITOR) 40 MG tablet Take 1 tablet (40 mg total) by mouth daily. 90 tablet 4   fexofenadine (ALLEGRA ALLERGY) 180 MG tablet Take 1 tablet (180 mg total) by mouth daily. 10 tablet 1   fluticasone (FLONASE) 50 MCG/ACT nasal spray Place 2 sprays into both nostrils daily.     furosemide (LASIX) 20 MG tablet TAKE 1 TABLET BY MOUTH TWICE A DAY AS NEEDED FOR EDEMA 180 tablet 4   levocetirizine (XYZAL) 5 MG tablet TAKE 1 TABLET BY MOUTH EVERY DAY IN THE EVENING 90 tablet 0   losartan (COZAAR) 100 MG tablet Take 1 tablet (100 mg total) by mouth daily. 90 tablet 4   metoprolol tartrate (LOPRESSOR) 25 MG tablet Take 1 tablet (25 mg total) by mouth 2 (two) times daily. 180 tablet 4   mupirocin ointment (BACTROBAN) 2 % APPLY TO AFFECTED AREA TWICE A DAY 22 g 0   No current facility-administered medications for this visit.    Patient confirms/reports the following allergies:   Allergies  Allergen Reactions   Fish Allergy    Ibuprofen     Told to avoid ibuprofen related products by doctor.    No orders of the defined types were placed in this encounter.   AUTHORIZATION INFORMATION Primary Insurance: 1D#: Group #:  Secondary Insurance: 1D#: Group #:  SCHEDULE INFORMATION: Date: 07/20/2022 Time: Location:  [facility]

## 2022-06-29 NOTE — Telephone Encounter (Signed)
   Patient Name: Jasmine Webb  DOB: 1955-05-12 MRN: [id number]  Primary Cardiologist: Lorine Bears, MD  Chart reviewed as part of pre-operative protocol coverage.   Patient is on Eliquis due to history of pulmonary embolism.  Therefore recommendations for holding medication should come from prescribing provider and not cardiology.  [name], Leodis Rains, NP 06/29/2022, 4:53 PM

## 2022-06-29 NOTE — Telephone Encounter (Signed)
Colonoscopy schedule on 07/20/2022

## 2022-07-18 ENCOUNTER — Telehealth: Payer: Self-pay | Admitting: *Deleted

## 2022-07-18 NOTE — Telephone Encounter (Signed)
Received procedure clearance for patient on 06/30/2022.  Patient is cleared to have procedure and should stop Eliquis 5 mg, 3 days before and restart 2 days after.  Called patient on 07/15/2022 and 07/18/2022.  I got a hold of patient, she have already stop Eliquis as instructed from cardiology.

## 2022-07-20 ENCOUNTER — Ambulatory Visit: Payer: Medicare Other | Admitting: Anesthesiology

## 2022-07-20 ENCOUNTER — Encounter: Payer: Self-pay | Admitting: Gastroenterology

## 2022-07-20 ENCOUNTER — Encounter
Admission: RE | Disposition: A | Discharge status: Home / Self Care | Payer: Self-pay | Source: Home / Self Care | Attending: Gastroenterology

## 2022-07-20 ENCOUNTER — Other Ambulatory Visit: Payer: Self-pay

## 2022-07-20 ENCOUNTER — Ambulatory Visit
Admission: RE | Admit: 2022-07-20 | Discharge: 2022-07-20 | Disposition: A | Discharge status: Home / Self Care | Payer: Medicare Other | Attending: Gastroenterology | Admitting: Gastroenterology

## 2022-07-20 DIAGNOSIS — K640 First degree hemorrhoids: Secondary | ICD-10-CM | POA: Insufficient documentation

## 2022-07-20 DIAGNOSIS — Z1211 Encounter for screening for malignant neoplasm of colon: Secondary | ICD-10-CM | POA: Insufficient documentation

## 2022-07-20 DIAGNOSIS — K621 Rectal polyp: Secondary | ICD-10-CM | POA: Insufficient documentation

## 2022-07-20 DIAGNOSIS — D123 Benign neoplasm of transverse colon: Secondary | ICD-10-CM | POA: Diagnosis not present

## 2022-07-20 DIAGNOSIS — Z6841 Body Mass Index (BMI) 40.0 and over, adult: Secondary | ICD-10-CM | POA: Insufficient documentation

## 2022-07-20 DIAGNOSIS — D122 Benign neoplasm of ascending colon: Secondary | ICD-10-CM | POA: Insufficient documentation

## 2022-07-20 DIAGNOSIS — K635 Polyp of colon: Secondary | ICD-10-CM

## 2022-07-20 DIAGNOSIS — I10 Essential (primary) hypertension: Secondary | ICD-10-CM | POA: Diagnosis not present

## 2022-07-20 HISTORY — PX: HEMOSTASIS CLIP PLACEMENT: SHX6857

## 2022-07-20 HISTORY — PX: POLYPECTOMY: SHX5525

## 2022-07-20 HISTORY — PX: SUBMUCOSAL TATTOO INJECTION: SHX6856

## 2022-07-20 HISTORY — PX: COLONOSCOPY WITH PROPOFOL: SHX5780

## 2022-07-20 SURGERY — COLONOSCOPY WITH PROPOFOL
Anesthesia: General

## 2022-07-20 MED ORDER — LIDOCAINE HCL (CARDIAC) PF 100 MG/5ML IV SOSY
PREFILLED_SYRINGE | INTRAVENOUS | Status: DC | PRN
  Administered 2022-07-20: 50 mg via INTRAVENOUS

## 2022-07-20 MED ORDER — EPINEPHRINE 0.3 MG/0.3ML IJ SOAJ: 0.3000 mg | Freq: Once | INTRAMUSCULAR | Status: DC | PRN

## 2022-07-20 MED ORDER — METHYLPREDNISOLONE SODIUM SUCC 125 MG IJ SOLR: 125.0000 mg | Freq: Once | INTRAMUSCULAR | Status: DC | PRN

## 2022-07-20 MED ORDER — DIPHENHYDRAMINE HCL 50 MG/ML IJ SOLN: 50.0000 mg | Freq: Once | INTRAMUSCULAR | Status: DC | PRN

## 2022-07-20 MED ORDER — PROPOFOL 10 MG/ML IV BOLUS
INTRAVENOUS | Status: DC | PRN
  Administered 2022-07-20 (×2): 50 mg via INTRAVENOUS
  Administered 2022-07-20: 100 mg via INTRAVENOUS
  Administered 2022-07-20: 40 mg via INTRAVENOUS
  Administered 2022-07-20 (×4): 50 mg via INTRAVENOUS
  Administered 2022-07-20: 40 mg via INTRAVENOUS

## 2022-07-20 MED ORDER — SODIUM CHLORIDE 0.9 % IV SOLN: INTRAVENOUS | Status: DC

## 2022-07-20 MED ORDER — SODIUM CHLORIDE 0.9 % IV SOLN: INTRAVENOUS | Status: DC | PRN

## 2022-07-20 MED ORDER — ALBUTEROL SULFATE (2.5 MG/3ML) 0.083% IN NEBU: 3.0000 mL | INHALATION_SOLUTION | Freq: Once | RESPIRATORY_TRACT | Status: DC | PRN

## 2022-07-20 MED ORDER — SODIUM CHLORIDE 0.9 % IV SOLN: 100.0000 mg | Freq: Once | INTRAVENOUS | Status: DC

## 2022-07-20 MED ORDER — FAMOTIDINE IN NACL 20-0.9 MG/50ML-% IV SOLN: 20.0000 mg | Freq: Once | INTRAVENOUS | Status: DC | PRN

## 2022-07-20 NOTE — Anesthesia Postprocedure Evaluation (Signed)
Anesthesia Post Note  Patient: Jasmine Webb  Procedure(s) Performed: COLONOSCOPY WITH PROPOFOL  Patient location during evaluation: Endoscopy Anesthesia Type: General Level of consciousness: awake and alert Pain management: pain level controlled Vital Signs Assessment: post-procedure vital signs reviewed and stable Respiratory status: spontaneous breathing, nonlabored ventilation, respiratory function stable and patient connected to nasal cannula oxygen Cardiovascular status: blood pressure returned to baseline and stable Postop Assessment: no apparent nausea or vomiting Anesthetic complications: no   No notable events documented.   Last Vitals:  Vitals:   07/20/22 1007 07/20/22 1018  BP: 138/81 (!) 145/85  Pulse: 72 66  Resp: 20 19  Temp:    SpO2: 98% 97%    Last Pain:  Vitals:   07/20/22 1018  TempSrc:   PainSc: 0-No pain                 Louie Boston

## 2022-07-20 NOTE — Anesthesia Preprocedure Evaluation (Signed)
Anesthesia Evaluation  Patient identified by MRN, date of birth, ID band Patient awake  General Assessment Comment:Arrived in wheel chair   Reviewed: Allergy & Precautions, NPO status , Patient's Chart, lab work & pertinent test results  History of Anesthesia Complications (+) Emergence Delirium and history of anesthetic complications  Airway Mallampati: III  TM Distance: >3 FB Neck ROM: full    Dental no notable dental hx. (+) Chipped   Pulmonary neg pulmonary ROS   Pulmonary exam normal        Cardiovascular hypertension, On Medications negative cardio ROS Normal cardiovascular exam+ dysrhythmias Supra Ventricular Tachycardia      Neuro/Psych negative neurological ROS  negative psych ROS   GI/Hepatic negative GI ROS, Neg liver ROS,,,  Endo/Other  negative endocrine ROS  Morbid obesity  Renal/GU negative Renal ROS  negative genitourinary   Musculoskeletal  (+) Arthritis ,    Abdominal  (+) + obese  Peds  Hematology negative hematology ROS (+) On Eliquis for hx of PE    Anesthesia Other Findings Past Medical History: No date: Arthritis No date: History of emergence delirium No date: Hyperlipidemia No date: Hypertension No date: Tumor     Comment:  breast and pelvic No date: Vertigo  Past Surgical History: 2003: ABDOMINAL HYSTERECTOMY No date: BREAST SURGERY No date: CHOLECYSTECTOMY 10/10/2019: PULMONARY THROMBECTOMY; N/A     Comment:  Procedure: PULMONARY THROMBECTOMY;  Surgeon: Annice Needy, MD;  Location: [facility] INVASIVE CV LAB;  Service:               Cardiovascular;  Laterality: N/A;  BMI    Body Mass Index: 50.84 kg/m      Reproductive/Obstetrics negative OB ROS                             Anesthesia Physical Anesthesia Plan  ASA: 3  Anesthesia Plan: General   Post-op Pain Management: Minimal or no pain anticipated   Induction: Intravenous  PONV  Risk Score and Plan: 2 and Propofol infusion and TIVA  Airway Management Planned: Natural Airway and Nasal Cannula  Additional Equipment:   Intra-op Plan:   Post-operative Plan:   Informed Consent: I have reviewed the patients History and Physical, chart, labs and discussed the procedure including the risks, benefits and alternatives for the proposed anesthesia with the patient or authorized representative who has indicated his/her understanding and acceptance.     Dental Advisory Given  Plan Discussed with: Anesthesiologist, CRNA and Surgeon  Anesthesia Plan Comments: (Patient consented for risks of anesthesia including but not limited to:  - adverse reactions to medications - risk of airway placement if required - damage to eyes, teeth, lips or other oral mucosa - nerve damage due to positioning  - sore throat or hoarseness - Damage to heart, brain, nerves, lungs, other parts of body or loss of life  Patient voiced understanding.)        Anesthesia Quick Evaluation

## 2022-07-20 NOTE — H&P (Signed)
Midge Minium, MD [facility] [address] [place], [place] [address] Phone: [phone] Fax : [phone]  Primary Care Physician:  Marjie Skiff, NP Primary Gastroenterologist:  Dr. Servando Snare  Pre-Procedure History & Physical: HPI:  Jasmine Webb is a 67 y.o. female is here for a screening colonoscopy.   Past Medical History:  Diagnosis Date   Arthritis    History of emergence delirium    Hyperlipidemia    Hypertension    Tumor    breast and pelvic   Vertigo     Past Surgical History:  Procedure Laterality Date   ABDOMINAL HYSTERECTOMY  2003   BREAST SURGERY     CHOLECYSTECTOMY     PULMONARY THROMBECTOMY N/A 10/10/2019   Procedure: PULMONARY THROMBECTOMY;  Surgeon: Annice Needy, MD;  Location: [facility] INVASIVE CV LAB;  Service: Cardiovascular;  Laterality: N/A;    Prior to Admission medications   Medication Sig Start Date End Date Taking? Authorizing Provider  fluticasone (FLONASE) 50 MCG/ACT nasal spray Place 2 sprays into both nostrils daily.   Yes [provider]  apixaban (ELIQUIS) 5 MG TABS tablet Take 1 tablet (5 mg total) by mouth 2 (two) times daily. 02/22/22   Cannady, Corrie Dandy T, NP  atorvastatin (LIPITOR) 40 MG tablet Take 1 tablet (40 mg total) by mouth daily. 03/28/22   Cannady, Corrie Dandy T, NP  fexofenadine (ALLEGRA ALLERGY) 180 MG tablet Take 1 tablet (180 mg total) by mouth daily. 04/05/21   Vigg, Avanti, MD  furosemide (LASIX) 20 MG tablet TAKE 1 TABLET BY MOUTH TWICE A DAY AS NEEDED FOR EDEMA 08/09/21   Cannady, Jolene T, NP  levocetirizine (XYZAL) 5 MG tablet TAKE 1 TABLET BY MOUTH EVERY DAY IN THE EVENING 05/03/22   Cannady, Jolene T, NP  losartan (COZAAR) 100 MG tablet Take 1 tablet (100 mg total) by mouth daily. 03/28/22   Cannady, Corrie Dandy T, NP  metoprolol tartrate (LOPRESSOR) 25 MG tablet Take 1 tablet (25 mg total) by mouth 2 (two) times daily. 03/28/22   [name] T, NP  mupirocin ointment (BACTROBAN) 2 % APPLY TO AFFECTED AREA TWICE A DAY 06/03/22    [name] T, NP    Allergies as of 06/29/2022 - Review Complete 06/06/2022  Allergen Reaction Noted   Fish allergy  08/09/2021   Ibuprofen  10/10/2019    Family History  Problem Relation Age of Onset   COPD Mother    ALS Father    Heart disease Sister    Breast cancer Sister    Colon cancer Sister    Diabetes Brother    Hypertension Brother    Asthma Son    Sinusitis Son    Congestive Heart Failure Maternal Uncle     Social History   Socioeconomic History   Marital status: Divorced    Spouse name: Not on file   Number of children: Not on file   Years of education: Not on file   Highest education level: Not on file  Occupational History   Not on file  Tobacco Use   Smoking status: Never   Smokeless tobacco: Never  Vaping Use   Vaping Use: Never used  Substance and Sexual Activity   Alcohol use: Never   Drug use: Never   Sexual activity: Not Currently  Other Topics Concern   Not on file  Social History Narrative   Not on file   Social Determinants of Health   Financial Resource Strain: Low Risk  (04/19/2022)   Overall Financial  Resource Strain (CARDIA)    Difficulty of Paying Living Expenses: Not hard at all  Food Insecurity: No Food Insecurity (04/19/2022)   Hunger Vital Sign    Worried About Running Out of Food in the Last Year: Never true    Ran Out of Food in the Last Year: Never true  Transportation Needs: No Transportation Needs (04/19/2022)   PRAPARE - Administrator, Civil Service (Medical): No    Lack of Transportation (Non-Medical): No  Physical Activity: Sufficiently Active (04/19/2022)   Exercise Vital Sign    Days of Exercise per Week: 7 days    Minutes of Exercise per Session: 60 min  Stress: No Stress Concern Present (04/19/2022)   [employer] of Occupational Health - Occupational Stress Questionnaire    Feeling of Stress : Not at all  Social Connections: Moderately Isolated (04/19/2022)   Social Connection and  Isolation Panel [NHANES]    Frequency of Communication with Friends and Family: More than three times a week    Frequency of Social Gatherings with Friends and Family: More than three times a week    Attends Religious Services: More than 4 times per year    Active Member of [employer] or Organizations: No    Attends Banker Meetings: Never    Marital Status: Divorced  Catering manager Violence: Not At Risk (04/19/2022)   Humiliation, Afraid, Rape, and Kick questionnaire    Fear of Current or Ex-Partner: No    Emotionally Abused: No    Physically Abused: No    Sexually Abused: No    Review of Systems: See HPI, otherwise negative ROS  Physical Exam: BP (!) 144/76   Pulse 70   Temp (!) 96.9 F (36.1 C) (Temporal)   Resp 18   Ht 5\' 3"  (1.6 m)   Wt 130.2 kg   LMP  (LMP Unknown)   SpO2 94%   BMI 50.84 kg/m  General:   Alert,  pleasant and cooperative in NAD Head:  Normocephalic and atraumatic. Neck:  Supple; no masses or thyromegaly. Lungs:  Clear throughout to auscultation.    Heart:  Regular rate and rhythm. Abdomen:  Soft, nontender and nondistended. Normal bowel sounds, without guarding, and without rebound.   Neurologic:  Alert and  oriented x4;  grossly normal neurologically.  Impression/Plan: Jasmine Webb is now here to undergo a screening colonoscopy.  Risks, benefits, and alternatives regarding colonoscopy have been reviewed with the patient.  Questions have been answered.  All parties agreeable.

## 2022-07-20 NOTE — Transfer of Care (Signed)
Immediate Anesthesia Transfer of Care Note  Patient: Jasmine Webb  Procedure(s) Performed: COLONOSCOPY WITH PROPOFOL  Patient Location: Endoscopy Unit  Anesthesia Type:General  Level of Consciousness: drowsy  Airway & Oxygen Therapy: Patient Spontanous Breathing and Patient connected to nasal cannula oxygen  Post-op Assessment: Report given to RN, Post -op Vital signs reviewed and stable, and Patient moving all extremities  Post vital signs: Reviewed and stable  Last Vitals:  Vitals Value Taken Time  BP 132/71 07/20/22 0957  Temp    Pulse 73 07/20/22 0959  Resp 19 07/20/22 0959  SpO2 94 % 07/20/22 0959  Vitals shown include unvalidated device data.  Last Pain:  Vitals:   07/20/22 0957  TempSrc:   PainSc: Asleep         Complications: No notable events documented.

## 2022-07-20 NOTE — Op Note (Addendum)
[facility] Gastroenterology Patient Name: Jasmine Webb Procedure Date: 07/20/2022 9:14 AM MRN: [id number] Account #: [id number] Date of Birth: 1955/07/06 Admit Type: Outpatient Age: 67 Room: [facility] ENDO ROOM 3 Gender: Female Note Status: Finalized Instrument Name: Nelda Marseille [id number] Procedure:             Colonoscopy Indications:           Screening for colorectal malignant neoplasm Providers:             Midge Minium MD, MD Medicines:             Propofol per Anesthesia Complications:         No immediate complications. Procedure:             Pre-Anesthesia Assessment:                        - Prior to the procedure, a History and Physical was                         performed, and patient medications and allergies were                         reviewed. The patient's tolerance of previous                         anesthesia was also reviewed. The risks and benefits                         of the procedure and the sedation options and risks                         were discussed with the patient. All questions were                         answered, and informed consent was obtained. Prior                         Anticoagulants: The patient has taken no anticoagulant                         or antiplatelet agents. ASA Grade Assessment: II - A                         patient with mild systemic disease. After reviewing                         the risks and benefits, the patient was deemed in                         satisfactory condition to undergo the procedure.                        After obtaining informed consent, the colonoscope was                         passed under direct vision. Throughout the procedure,                         the patient's blood  pressure, pulse, and oxygen                         saturations were monitored continuously. The                         Colonoscope was introduced through the anus and                         advanced to the the  cecum, identified by appendiceal                         orifice and ileocecal valve. The colonoscopy was                         performed without difficulty. The patient tolerated                         the procedure well. The quality of the bowel                         preparation was excellent. Findings:      The perianal and digital rectal examinations were normal.      A 10 mm polyp was found in the proximal ascending colon. The polyp was       sessile. The polyp was removed with a cold snare. Resection and       retrieval were complete. Area was successfully injected with 3 mL saline       with methylene blue for tattooing. To prevent bleeding       post-intervention, one hemostatic clip was successfully placed (MR       conditional). Clip manufacturer: [employer]. There was no       bleeding at the end of the procedure.      A 6 mm polyp was found in the proximal transverse colon. The polyp was       sessile. The polyp was removed with a cold snare. Resection and       retrieval were complete.      A 4 mm polyp was found in the ascending colon. The polyp was sessile.       The polyp was removed with a cold snare. Resection and retrieval were       complete.      A 4 mm polyp was found in the rectum. The polyp was sessile. The polyp       was removed with a cold snare. Resection and retrieval were complete.      Non-bleeding internal hemorrhoids were found during retroflexion. The       hemorrhoids were Grade I (internal hemorrhoids that do not prolapse). Impression:            - One 10 mm polyp in the proximal ascending colon,                         removed with a cold snare. Resected and retrieved.                         Injected. Clip (MR conditional) was placed. Clip  manufacturer: [employer].                        - One 6 mm polyp in the proximal transverse colon,                         removed with a cold snare. Resected and  retrieved.                        - One 4 mm polyp in the ascending colon, removed with                         a cold snare. Resected and retrieved.                        - One 4 mm polyp in the rectum, removed with a cold                         snare. Resected and retrieved.                        - Non-bleeding internal hemorrhoids. Recommendation:        - Discharge patient to home.                        - Resume previous diet.                        - Continue present medications.                        - Await pathology results.                        - If the pathology report reveals adenomatous tissue,                         then repeat the colonoscopy for surveillance in 5                         years. Procedure Code(s):     --- Professional ---                        [phone], Colonoscopy, flexible; with removal of                         tumor(s), polyp(s), or other lesion(s) by snare                         technique                        [id number], Colonoscopy, flexible; with directed submucosal                         injection(s), any substance Diagnosis Code(s):     --- Professional ---                        Z12.11, Encounter for screening for malignant neoplasm  of colon                        D12.3, Benign neoplasm of transverse colon (hepatic                         flexure or splenic flexure) CPT copyright 2022 American Medical Association. All rights reserved. The codes documented in this report are preliminary and upon coder review may  be revised to meet current compliance requirements. Midge Minium MD, MD 07/20/2022 9:56:40 AM This report has been signed electronically. Number of Addenda: 0 Note Initiated On: 07/20/2022 9:14 AM Scope Withdrawal Time: 0 hours 10 minutes 34 seconds  Total Procedure Duration: 0 hours 26 minutes 21 seconds  Estimated Blood Loss:  Estimated blood loss: none.      [facility]

## 2022-07-21 ENCOUNTER — Encounter: Payer: Self-pay | Admitting: Gastroenterology

## 2022-07-22 ENCOUNTER — Other Ambulatory Visit: Payer: Self-pay | Admitting: Nurse Practitioner

## 2022-07-22 NOTE — Telephone Encounter (Signed)
Requested medication (s) are due for refill today: yes  Requested medication (s) are on the active medication list: yes  Last refill:  06/03/22  Future visit scheduled: yes  Notes to clinic:  Medication not assigned to a protocol, review manually.      Requested Prescriptions  Pending Prescriptions Disp Refills   mupirocin ointment (BACTROBAN) 2 % [Pharmacy Med Name: MUPIROCIN 2% OINTMENT] 22 g 0    Sig: APPLY TO AFFECTED AREA TWICE A DAY     Off-Protocol Failed - 07/22/2022  2:32 AM      Failed - Medication not assigned to a protocol, review manually.      Passed - Valid encounter within last 12 months    Recent Outpatient Visits           1 month ago Ingrown right greater toenail   [place] [facility] [place], Megan P, DO   1 month ago Ingrown right greater toenail   [place] [facility] [place], [place] T, NP   2 months ago Ingrown toenail with infection   [place] [facility] Larae Grooms, NP   3 months ago Morbid obesity due to excess calories (HCC)   [facility] [place], Corrie Dandy T, NP   10 months ago Morbid obesity due to excess calories [facility])   [place] [facility] Marjie Skiff, NP       Future Appointments             In 2 months Cannady, Dorie Rank, NP [place] [facility], [facility]

## 2022-07-29 ENCOUNTER — Telehealth: Payer: Self-pay | Admitting: Gastroenterology

## 2022-07-29 NOTE — Telephone Encounter (Signed)
Path report scanned in 6/26... Please advise

## 2022-07-29 NOTE — Telephone Encounter (Signed)
Patient called in to get her result from her procedure. Patient stated that it has been over 7 days.

## 2022-07-30 ENCOUNTER — Encounter: Payer: Self-pay | Admitting: Gastroenterology

## 2022-08-01 NOTE — Telephone Encounter (Signed)
Patient called in wanted to know her test result. She stated that she has been waiting on her test result. I advised her that you will call her when you are available.

## 2022-08-02 ENCOUNTER — Telehealth: Payer: Self-pay

## 2022-08-02 NOTE — Telephone Encounter (Signed)
Pt is aware of results and that she will be due for repeat in 5 yrs, letter mailed, nothing further needed

## 2022-08-11 ENCOUNTER — Other Ambulatory Visit: Payer: Self-pay | Admitting: Nurse Practitioner

## 2022-08-11 NOTE — Telephone Encounter (Signed)
Requested medication (s) are due for refill today: routing for review  Requested medication (s) are on the active medication list: yes  Last refill:  6/21  Future visit scheduled: yes  Notes to clinic:  Medication not assigned to a protocol, review manually.      Requested Prescriptions  Pending Prescriptions Disp Refills   mupirocin ointment (BACTROBAN) 2 % [Pharmacy Med Name: MUPIROCIN 2% OINTMENT] 22 g 0    Sig: APPLY TO AFFECTED AREA TWICE A DAY     Off-Protocol Failed - 08/11/2022  2:33 AM      Failed - Medication not assigned to a protocol, review manually.      Passed - Valid encounter within last 12 months    Recent Outpatient Visits           2 months ago Ingrown right greater toenail   [place] [facility] [place], Megan P, DO   2 months ago Ingrown right greater toenail   [place] [facility] [place], [place] T, NP   3 months ago Ingrown toenail with infection   [place] [facility] Larae Grooms, NP   4 months ago Morbid obesity due to excess calories (HCC)   [place] [facility] [place], Corrie Dandy T, NP   10 months ago Morbid obesity due to excess calories [facility])   [place] [facility] [place], Dorie Rank, NP       Future Appointments             In 1 month Cannady, Dorie Rank, NP [facility], [facility]

## 2022-08-27 ENCOUNTER — Other Ambulatory Visit: Payer: Self-pay | Admitting: Nurse Practitioner

## 2022-08-29 NOTE — Telephone Encounter (Signed)
Requested Prescriptions  Pending Prescriptions Disp Refills   levocetirizine (XYZAL) 5 MG tablet [Pharmacy Med Name: LEVOCETIRIZINE 5 MG TABLET] 90 tablet 0    Sig: TAKE 1 TABLET BY MOUTH EVERY DAY IN THE EVENING     Ear, Nose, and Throat:  Antihistamines - levocetirizine dihydrochloride Passed - 08/27/2022 12:48 PM      Passed - Cr in normal range and within 360 days    Creatinine, Ser  Date Value Ref Range Status  03/28/2022 0.68 0.57 - 1.00 mg/dL Final         Passed - eGFR is 10 or above and within 360 days    GFR calc Af Amer  Date Value Ref Range Status  01/07/2020 111 >59 mL/min/1.73 Final    Comment:    **In accordance with recommendations from the NKF-ASN Task force,**   [facility] is in the process of updating its eGFR calculation to the   2021 CKD-EPI creatinine equation that estimates kidney function   without a race variable.    GFR, Estimated  Date Value Ref Range Status  11/13/2020 >60 >60 mL/min Final    Comment:    (NOTE) Calculated using the CKD-EPI Creatinine Equation (2021)    eGFR  Date Value Ref Range Status  03/28/2022 95 >59 mL/min/1.73 Final         Passed - Valid encounter within last 12 months    Recent Outpatient Visits           2 months ago Ingrown right greater toenail   [place] [facility] [place], Megan P, DO   2 months ago Ingrown right greater toenail   [place] [facility] [place], [place] T, NP   3 months ago Ingrown toenail with infection   [place] [facility] [place], Clydie Braun, NP   5 months ago Morbid obesity due to excess calories (HCC)   [place] [facility] [place], [place] T, NP   11 months ago Morbid obesity due to excess calories [facility])   [place] [facility] [place], Dorie Rank, NP       Future Appointments             In 4 weeks Cannady, Dorie Rank, NP [place] [facility], [facility]

## 2022-09-01 ENCOUNTER — Other Ambulatory Visit: Payer: Self-pay | Admitting: Nurse Practitioner

## 2022-09-02 NOTE — Telephone Encounter (Signed)
Requested Prescriptions  Pending Prescriptions Disp Refills   furosemide (LASIX) 20 MG tablet [Pharmacy Med Name: FUROSEMIDE 20 MG TABLET] 180 tablet 0    Sig: TAKE 1 TABLET BY MOUTH TWICE A DAY AS NEEDED FOR EDEMA     Cardiovascular:  Diuretics - Loop Failed - 09/01/2022 11:29 AM      Failed - Mg Level in normal range and within 180 days    Magnesium  Date Value Ref Range Status  10/13/2019 1.9 1.7 - 2.4 mg/dL Final    Comment:    Performed at [facility], [address] [place], [place] [address]         Failed - Last BP in normal range    BP Readings from Last 1 Encounters:  07/20/22 (!) 145/85         Passed - K in normal range and within 180 days    Potassium  Date Value Ref Range Status  03/28/2022 3.8 3.5 - 5.2 mmol/L Final         Passed - Ca in normal range and within 180 days    Calcium  Date Value Ref Range Status  03/28/2022 9.4 8.7 - 10.3 mg/dL Final         Passed - Na in normal range and within 180 days    Sodium  Date Value Ref Range Status  03/28/2022 144 134 - 144 mmol/L Final         Passed - Cr in normal range and within 180 days    Creatinine, Ser  Date Value Ref Range Status  03/28/2022 0.68 0.57 - 1.00 mg/dL Final         Passed - Cl in normal range and within 180 days    Chloride  Date Value Ref Range Status  03/28/2022 102 96 - 106 mmol/L Final         Passed - Valid encounter within last 6 months    Recent Outpatient Visits           2 months ago Ingrown right greater toenail   [place] [facility] [place], Megan P, DO   3 months ago Ingrown right greater toenail   [place] [facility] [place], [place] T, NP   4 months ago Ingrown toenail with infection   [place] [facility] [place], Clydie Braun, NP   5 months ago Morbid obesity due to excess calories (HCC)   [place] [facility] [place], [place] T, NP   11 months ago Morbid obesity due to excess calories [facility])    [place] [facility] [place], Dorie Rank, NP       Future Appointments             In 3 weeks Cannady, Dorie Rank, NP  [facility], [facility]

## 2022-09-24 NOTE — Patient Instructions (Incomplete)
Be Involved in Caring For Your Health:  Taking Medications When medications are taken as directed, they can greatly improve your health. But if they are not taken as prescribed, they may not work. In some cases, not taking them correctly can be harmful. To help ensure your treatment remains effective and safe, understand your medications and how to take them. Bring your medications to each visit for review by your provider.  Your lab results, notes, and after visit summary will be available on My Chart. We strongly encourage you to use this feature. If lab results are abnormal the clinic will contact you with the appropriate steps. If the clinic does not contact you assume the results are satisfactory. You can always view your results on My Chart. If you have questions regarding your health or results, please contact the clinic during office hours. You can also ask questions on My Chart.  We at [facility] are grateful that you chose us to provide your care. We strive to provide evidence-based and compassionate care and are always looking for feedback. If you get a survey from the clinic please complete this so we can hear your opinions.  DASH Eating Plan DASH stands for Dietary Approaches to Stop Hypertension. The DASH eating plan is a healthy eating plan that has been shown to: Lower high blood pressure (hypertension). Reduce your risk for type 2 diabetes, heart disease, and stroke. Help with weight loss. What are tips for following this plan? Reading food labels Check food labels for the amount of salt (sodium) per serving. Choose foods with less than 5 percent of the Daily Value (DV) of sodium. In general, foods with less than 300 milligrams (mg) of sodium per serving fit into this eating plan. To find whole grains, look for the word "whole" as the first word in the ingredient list. Shopping Buy products labeled as "low-sodium" or "no salt added." Buy fresh foods. Avoid canned  foods and pre-made or frozen meals. Cooking Try not to add salt when you cook. Use salt-free seasonings or herbs instead of table salt or sea salt. Check with your health care provider or pharmacist before using salt substitutes. Do not fry foods. Cook foods in healthy ways, such as baking, boiling, grilling, roasting, or broiling. Cook using oils that are good for your heart. These include olive, canola, avocado, soybean, and sunflower oil. Meal planning  Eat a balanced diet. This should include: 4 or more servings of fruits and 4 or more servings of vegetables each day. Try to fill half of your plate with fruits and vegetables. 6-8 servings of whole grains each day. 6 or less servings of lean meat, poultry, or fish each day. 1 oz is 1 serving. A 3 oz (85 g) serving of meat is about the same size as the palm of your hand. One egg is 1 oz (28 g). 2-3 servings of low-fat dairy each day. One serving is 1 cup (237 mL). 1 serving of nuts, seeds, or beans 5 times each week. 2-3 servings of heart-healthy fats. Healthy fats called omega-3 fatty acids are found in foods such as walnuts, flaxseeds, fortified milks, and eggs. These fats are also found in cold-water fish, such as sardines, salmon, and mackerel. Limit how much you eat of: Canned or prepackaged foods. Food that is high in trans fat, such as fried foods. Food that is high in saturated fat, such as fatty meat. Desserts and other sweets, sugary drinks, and other foods with added sugar. Full-fat   dairy products. Do not salt foods before eating. Do not eat more than 4 egg yolks a week. Try to eat at least 2 vegetarian meals a week. Eat more home-cooked food and less restaurant, buffet, and fast food. Lifestyle When eating at a restaurant, ask if your food can be made with less salt or no salt. If you drink alcohol: Limit how much you have to: 0-1 drink a day if you are female. 0-2 drinks a day if you are female. Know how much alcohol is in  your drink. In the [place] one drink is one 12 oz bottle of beer (355 mL), one 5 oz glass of wine (148 mL), or one 1 oz glass of hard liquor (44 mL). General information Avoid eating more than 2,300 mg of salt a day. If you have hypertension, you may need to reduce your sodium intake to 1,500 mg a day. Work with your provider to stay at a healthy body weight or lose weight. Ask what the best weight range is for you. On most days of the week, get at least 30 minutes of exercise that causes your heart to beat faster. This may include walking, swimming, or biking. Work with your provider or dietitian to adjust your eating plan to meet your specific calorie needs. What foods should I eat? Fruits All fresh, dried, or frozen fruit. Canned fruits that are in their natural juice and do not have sugar added to them. Vegetables Fresh or frozen vegetables that are raw, steamed, roasted, or grilled. Low-sodium or reduced-sodium tomato and vegetable juice. Low-sodium or reduced-sodium tomato sauce and tomato paste. Low-sodium or reduced-sodium canned vegetables. Grains Whole-grain or whole-wheat bread. Whole-grain or whole-wheat pasta. Brown rice. Oatmeal. Quinoa. Bulgur. Whole-grain and low-sodium cereals. Pita bread. Low-fat, low-sodium crackers. Whole-wheat flour tortillas. Meats and other proteins Skinless chicken or turkey. Ground chicken or turkey. Pork with fat trimmed off. Fish and seafood. Egg whites. Dried beans, peas, or lentils. Unsalted nuts, nut butters, and seeds. Unsalted canned beans. Lean cuts of beef with fat trimmed off. Low-sodium, lean precooked or cured meat, such as sausages or meat loaves. Dairy Low-fat (1%) or fat-free (skim) milk. Reduced-fat, low-fat, or fat-free cheeses. Nonfat, low-sodium ricotta or cottage cheese. Low-fat or nonfat yogurt. Low-fat, low-sodium cheese. Fats and oils Soft margarine without trans fats. Vegetable oil. Reduced-fat, low-fat, or light mayonnaise and salad  dressings (reduced-sodium). Canola, safflower, olive, avocado, soybean, and sunflower oils. Avocado. Seasonings and condiments Herbs. Spices. Seasoning mixes without salt. Other foods Unsalted popcorn and pretzels. Fat-free sweets. The items listed above may not be all the foods and drinks you can have. Talk to a dietitian to learn more. What foods should I avoid? Fruits Canned fruit in a light or heavy syrup. Fried fruit. Fruit in cream or butter sauce. Vegetables Creamed or fried vegetables. Vegetables in a cheese sauce. Regular canned vegetables that are not marked as low-sodium or reduced-sodium. Regular canned tomato sauce and paste that are not marked as low-sodium or reduced-sodium. Regular tomato and vegetable juices that are not marked as low-sodium or reduced-sodium. Pickles. Olives. Grains Baked goods made with fat, such as croissants, muffins, or some breads. Dry pasta or rice meal packs. Meats and other proteins Fatty cuts of meat. Ribs. Fried meat. Bacon. Bologna, salami, and other precooked or cured meats, such as sausages or meat loaves, that are not lean and low in sodium. Fat from the back of a pig (fatback). Bratwurst. Salted nuts and seeds. Canned beans with added salt. Canned   or smoked fish. Whole eggs or egg yolks. Chicken or turkey with skin. Dairy Whole or 2% milk, cream, and half-and-half. Whole or full-fat cream cheese. Whole-fat or sweetened yogurt. Full-fat cheese. Nondairy creamers. Whipped toppings. Processed cheese and cheese spreads. Fats and oils Butter. Stick margarine. Lard. Shortening. Ghee. Bacon fat. Tropical oils, such as coconut, palm kernel, or palm oil. Seasonings and condiments Onion salt, garlic salt, seasoned salt, table salt, and sea salt. Worcestershire sauce. Tartar sauce. Barbecue sauce. Teriyaki sauce. Soy sauce, including reduced-sodium soy sauce. Steak sauce. Canned and packaged gravies. Fish sauce. Oyster sauce. Cocktail sauce. Store-bought  horseradish. Ketchup. Mustard. Meat flavorings and tenderizers. Bouillon cubes. Hot sauces. Pre-made or packaged marinades. Pre-made or packaged taco seasonings. Relishes. Regular salad dressings. Other foods Salted popcorn and pretzels. The items listed above may not be all the foods and drinks you should avoid. Talk to a dietitian to learn more. Where to find more information National Heart, Lung, and Blood Institute (NHLBI): nhlbi.nih.gov American Heart Association (AHA): heart.org Academy of Nutrition and Dietetics: eatright.org National Kidney Foundation (NKF): kidney.org This information is not intended to replace advice given to you by your health care provider. Make sure you discuss any questions you have with your health care provider. Document Revised: 02/03/2022 Document Reviewed: 02/03/2022 Elsevier Patient Education  2024 [employer]  

## 2022-09-26 ENCOUNTER — Ambulatory Visit (INDEPENDENT_AMBULATORY_CARE_PROVIDER_SITE_OTHER): Payer: Medicare Other | Admitting: Nurse Practitioner

## 2022-09-26 ENCOUNTER — Encounter: Payer: Self-pay | Admitting: Nurse Practitioner

## 2022-09-26 DIAGNOSIS — Z86711 Personal history of pulmonary embolism: Secondary | ICD-10-CM

## 2022-09-26 DIAGNOSIS — Z0000 Encounter for general adult medical examination without abnormal findings: Secondary | ICD-10-CM | POA: Diagnosis not present

## 2022-09-26 DIAGNOSIS — I10 Essential (primary) hypertension: Secondary | ICD-10-CM | POA: Diagnosis not present

## 2022-09-26 DIAGNOSIS — Z23 Encounter for immunization: Secondary | ICD-10-CM

## 2022-09-26 DIAGNOSIS — E559 Vitamin D deficiency, unspecified: Secondary | ICD-10-CM | POA: Diagnosis not present

## 2022-09-26 DIAGNOSIS — K760 Fatty (change of) liver, not elsewhere classified: Secondary | ICD-10-CM | POA: Diagnosis not present

## 2022-09-26 DIAGNOSIS — E782 Mixed hyperlipidemia: Secondary | ICD-10-CM

## 2022-09-26 DIAGNOSIS — I471 Supraventricular tachycardia, unspecified: Secondary | ICD-10-CM | POA: Diagnosis not present

## 2022-09-26 DIAGNOSIS — Z6841 Body Mass Index (BMI) 40.0 and over, adult: Secondary | ICD-10-CM

## 2022-09-26 DIAGNOSIS — I890 Lymphedema, not elsewhere classified: Secondary | ICD-10-CM

## 2022-09-26 DIAGNOSIS — Z1231 Encounter for screening mammogram for malignant neoplasm of breast: Secondary | ICD-10-CM

## 2022-09-26 DIAGNOSIS — R7309 Other abnormal glucose: Secondary | ICD-10-CM

## 2022-09-26 DIAGNOSIS — Z9189 Other specified personal risk factors, not elsewhere classified: Secondary | ICD-10-CM

## 2022-09-26 DIAGNOSIS — D6869 Other thrombophilia: Secondary | ICD-10-CM

## 2022-09-26 LAB — BAYER DCA HB A1C WAIVED: HB A1C (BAYER DCA - WAIVED): 6.1 % — ABNORMAL HIGH (ref 4.8–5.6)

## 2022-09-26 MED ORDER — FUROSEMIDE 20 MG PO TABS: ORAL_TABLET | ORAL | 4 refills | Status: DC

## 2022-09-26 MED ORDER — LEVOCETIRIZINE DIHYDROCHLORIDE 5 MG PO TABS: ORAL_TABLET | ORAL | 4 refills | Status: DC

## 2022-09-26 NOTE — Assessment & Plan Note (Signed)
Chronic, stable.   Continue to collaborate with vascular as needed and use compression pumps at home + socks when out and about.  She is very consistent with using both.

## 2022-09-26 NOTE — Assessment & Plan Note (Signed)
Chronic, stable.  BP at goal in office today.  Recommend she monitor BP at home regularly and document.  Will continue Losartan 100 MG daily, Metoprolol, and Lasix 20 MG for edema.  Would avoid Amlodipine due to baseline edema to BLE with lymphedema. Focus on DASH diet at home.  Urine ALB 12 March 2022.  Return in 6 months.  Recommend: - Reminded to call for an overnight weight gain of >2 pounds or a weekly weight weight of >5 pounds - not adding salt to his food and has been reading food labels. Reviewed the importance of keeping daily sodium intake to '2000mg'$  daily

## 2022-09-26 NOTE — Assessment & Plan Note (Signed)
Chronic, ongoing.  Continue current medication regimen and adjust as needed, Atorvastatin 40 MG.  Lipid panel today. 

## 2022-09-26 NOTE — Assessment & Plan Note (Signed)
Stable.  With Covid in September 2021.  At this time will continue Eliquis and collaboration with vascular + cardiology.  Refills up to date.  Cardiology recommends lifelong treatment.

## 2022-09-26 NOTE — Assessment & Plan Note (Signed)
Chronic, stable.  Recommend she continue supplement Vitamin D3 1000 units daily for bone health.  Recheck level today and recommend she schedule DEXA.

## 2022-09-26 NOTE — Assessment & Plan Note (Signed)
Recommended eating smaller high protein, low fat meals more frequently and exercising 30 mins a day 5 times a week with a goal of 10-15lb weight loss in the next 3 months. Patient voiced their understanding and motivation to adhere to these recommendations.  

## 2022-09-26 NOTE — Assessment & Plan Note (Signed)
Chronic, stable.  Followed by cardiology, will continue Metoprolol as ordered by them.  Recent notes and testing reviewed.  She has no cardiac symptoms at this time.

## 2022-09-26 NOTE — Progress Notes (Signed)
BP 128/80 (BP Location: Left Arm, Patient Position: Sitting, Cuff Size: Large)   Pulse 62   Temp 98.6 F (37 C)   Resp 18   Wt 272 lb 12.8 oz (123.7 kg)   LMP  (LMP Unknown)   SpO2 96%   BMI 48.32 kg/m    Subjective:    Patient ID: Jasmine Webb, female    DOB: 1955/08/11, 67 y.o.   MRN: [id number]  HPI: [name] is a 67 y.o. female presenting on 09/26/2022 for comprehensive medical examination. Current medical complaints include:none  She currently lives with: Menopausal Symptoms: no  HYPERTENSION / HYPERLIPIDEMIA Continues on Losartan 100 MG, Metoprolol 25 MG BID, and Atorvastatin 40 MG daily.  Saw vascular, Dr. Wyn Quaker, on 03/25/21 -- no changes, to return only if leg causes issues -- 3-4 years for repeat imaging.  Continues on Eliquis for history of PE with Covid. Last saw cardiology 03/04/22 -- for SVT and history of PE -- had no changes a visit.    Does have compression hose and is wearing them daily for her lymphedema.  Has Lasix to take as needed -- takes this daily.  Last A1c in in prediabetic range at 5.9% in February, focused on diet at home.   History of hepatic steatosis on imaging, working on diet changes.  Takes multivitamin for history of low Vit D.  Satisfied with current treatment? yes Duration of hypertension: chronic BP monitoring frequency: sometimes BP range: 130/80 range BP medication side effects: no Duration of hyperlipidemia: chronic Cholesterol medication side effects: no Cholesterol supplements: none Medication compliance: good compliance Aspirin: no Recent stressors: no Recurrent headaches: no Visual changes: no Palpitations: no Dyspnea: ano Chest pain: no Lower extremity edema: at baseline, wearing compression Dizzy/lightheaded: no  Functional Status Survey: Is the patient deaf or have difficulty hearing?: No Does the patient have difficulty seeing, even when wearing glasses/contacts?: No Does the patient have difficulty concentrating,  remembering, or making decisions?: No Does the patient have difficulty walking or climbing stairs?: No Does the patient have difficulty dressing or bathing?: No Does the patient have difficulty doing errands alone such as visiting a doctor's office or shopping?: No   Depression Screen done today and results listed below:     04/19/2022    3:29 PM 03/28/2022   11:05 AM 04/01/2021    9:13 AM 02/08/2021    3:52 PM 01/19/2021    9:13 AM  Depression screen PHQ 2/9  Decreased Interest 0 0 0 0 0  Down, Depressed, Hopeless 0 0 0 0 0  PHQ - 2 Score 0 0 0 0 0  Altered sleeping 0 0 0 0   Tired, decreased energy 0 0 0 0   Change in appetite 0 0 0 0   Feeling bad or failure about yourself  0 0 0 0   Trouble concentrating 0 0 0 0   Moving slowly or fidgety/restless 0 0 0 0   Suicidal thoughts 0 0 0 0   PHQ-9 Score 0 0 0 0   Difficult doing work/chores Not difficult at all Not difficult at all Not difficult at all Not difficult at all       03/28/2022   11:05 AM 04/01/2021    9:13 AM 02/08/2021    3:52 PM  GAD 7 : Generalized Anxiety Score  Nervous, Anxious, on Edge 0 0 0  Control/stop worrying 0 0 0  Worry too much - different things 0 0 0  Trouble relaxing 0 0 0  Restless 0 0 0  Easily annoyed or irritable 0 0 0  Afraid - awful might happen 0 0 0  Total GAD 7 Score 0 0 0  Anxiety Difficulty  Not difficult at all Not difficult at all      11/15/2020    7:52 AM 01/19/2021    9:03 AM 04/01/2021    9:13 AM 04/19/2022    3:31 PM 09/26/2022    9:12 AM  Fall Risk  Falls in the past year?  0 0 0 0  Was there an injury with Fall?  0 0 0 0  Fall Risk Category Calculator  0 0 0 0  Fall Risk Category (Retired)  Low Low    (RETIRED) Patient Fall Risk Level Low fall risk Low fall risk Low fall risk    Patient at Risk for Falls Due to   No Fall Risks No Fall Risks No Fall Risks  Fall risk Follow up  Falls evaluation completed;Falls prevention discussed Falls evaluation completed Falls prevention  discussed;Falls evaluation completed Education provided    Past Medical History:  Past Medical History:  Diagnosis Date   Arthritis    History of emergence delirium    Hyperlipidemia    Hypertension    Tumor    breast and pelvic   Vertigo     Surgical History:  Past Surgical History:  Procedure Laterality Date   ABDOMINAL HYSTERECTOMY  2003   BREAST SURGERY     CHOLECYSTECTOMY     COLONOSCOPY WITH PROPOFOL N/A 07/20/2022   Procedure: COLONOSCOPY WITH PROPOFOL;  Surgeon: Midge Minium, MD;  Location: [facility] ENDOSCOPY;  Service: Endoscopy;  Laterality: N/A;   HEMOSTASIS CLIP PLACEMENT  07/20/2022   Procedure: HEMOSTASIS CLIP PLACEMENT;  Surgeon: Midge Minium, MD;  Location: [facility] ENDOSCOPY;  Service: Endoscopy;;   POLYPECTOMY  07/20/2022   Procedure: POLYPECTOMY;  Surgeon: Midge Minium, MD;  Location: [facility] ENDOSCOPY;  Service: Endoscopy;;   PULMONARY THROMBECTOMY N/A 10/10/2019   Procedure: PULMONARY THROMBECTOMY;  Surgeon: Annice Needy, MD;  Location: [facility] IN[facility]IVE [facility];  Service: Cardiovascular;  Laterality: N/A;   SUBMUCOSAL TATTOO INJECTION  07/20/2022   Procedure: SUBMUCOSAL TATTOO INJECTION;  Surgeon: Midge Minium, MD;  Location: [facility] ENDOSCOPY;  Service: Endoscopy;;    Medications:  Current Outpatient Medications on File Prior to Visit  Medication Sig   apixaban (ELIQUIS) 5 MG TABS tablet Take 1 tablet (5 mg total) by mouth 2 (two) times daily.   atorvastatin (LIPITOR) 40 MG tablet Take 1 tablet (40 mg total) by mouth daily.   fluticasone (FLONASE) 50 MCG/ACT nasal spray Place 2 sprays into both nostrils daily.   losartan (COZAAR) 100 MG tablet Take 1 tablet (100 mg total) by mouth daily.   metoprolol tartrate (LOPRESSOR) 25 MG tablet Take 1 tablet (25 mg total) by mouth 2 (two) times daily.   mupirocin ointment (BACTROBAN) 2 % APPLY TO AFFECTED AREA TWICE A DAY   No current facility-administered medications on file prior to visit.    Allergies:  Allergies  Allergen  Reactions   Fish Allergy    Ibuprofen     Told to avoid ibuprofen related products by doctor.    Social History:  Social History   Socioeconomic History   Marital status: Divorced    Spouse name: Not on file   Number of children: Not on file   Years of education: Not on file   Highest education level: Not on file  Occupational History   Not on file  Tobacco  Use   Smoking status: Never   Smokeless tobacco: Never  Vaping Use   Vaping status: Never Used  Substance and Sexual Activity   Alcohol use: Never   Drug use: Never   Sexual activity: Not Currently  Other Topics Concern   Not on file  Social History Narrative   Not on file   Social Determinants of Health   Financial Resource Strain: Low Risk  (04/19/2022)   Overall Financial Resource Strain (CARDIA)    Difficulty of Paying Living Expenses: Not hard at all  Food Insecurity: No Food Insecurity (04/19/2022)   Hunger Vital Sign    Worried About Running Out of Food in the Last Year: Never true    Ran Out of Food in the Last Year: Never true  Transportation Needs: No Transportation Needs (04/19/2022)   PRAPARE - Administrator, Civil Service (Medical): No    Lack of Transportation (Non-Medical): No  Physical Activity: Sufficiently Active (04/19/2022)   Exercise Vital Sign    Days of Exercise per Week: 7 days    Minutes of Exercise per Session: 60 min  Stress: No Stress Concern Present (04/19/2022)   [employer] of Occupational Health - Occupational Stress Questionnaire    Feeling of Stress : Not at all  Social Connections: Moderately Isolated (04/19/2022)   Social Connection and Isolation Panel [NHANES]    Frequency of Communication with Friends and Family: More than three times a week    Frequency of Social Gatherings with Friends and Family: More than three times a week    Attends Religious Services: More than 4 times per year    Active Member of [employer] or Organizations: No    Attends Tax inspector Meetings: Never    Marital Status: Divorced  Catering manager Violence: Not At Risk (04/19/2022)   Humiliation, Afraid, Rape, and Kick questionnaire    Fear of Current or Ex-Partner: No    Emotionally Abused: No    Physically Abused: No    Sexually Abused: No   Social History   Tobacco Use  Smoking Status Never  Smokeless Tobacco Never   Social History   Substance and Sexual Activity  Alcohol Use Never    Family History:  Family History  Problem Relation Age of Onset   COPD Mother    ALS Father    Heart disease Sister    Breast cancer Sister    Colon cancer Sister    Diabetes Brother    Hypertension Brother    Asthma Son    Sinusitis Son    Congestive Heart Failure Maternal Uncle     Past medical history, surgical history, medications, allergies, family history and social history reviewed with patient today and changes made to appropriate areas of the chart.   ROS All other ROS negative except what is listed above and in the HPI.      Objective:    BP 128/80 (BP Location: Left Arm, Patient Position: Sitting, Cuff Size: Large)   Pulse 62   Temp 98.6 F (37 C)   Resp 18   Wt 272 lb 12.8 oz (123.7 kg)   LMP  (LMP Unknown)   SpO2 96%   BMI 48.32 kg/m   Wt Readings from Last 3 Encounters:  09/26/22 272 lb 12.8 oz (123.7 kg)  07/20/22 287 lb (130.2 kg)  06/06/22 295 lb 4.8 oz (133.9 kg)    Physical Exam Vitals and nursing note reviewed. Exam conducted with a chaperone present.  Constitutional:  General: She is awake. She is not in acute distress.    Appearance: She is well-developed and well-groomed. She is obese. She is not ill-appearing or toxic-appearing.  HENT:     Head: Normocephalic and atraumatic.     Right Ear: Hearing, tympanic membrane, ear canal and external ear normal. No drainage.     Left Ear: Hearing, tympanic membrane, ear canal and external ear normal. No drainage.     Nose: Nose normal.     Right Sinus: No maxillary  sinus tenderness or frontal sinus tenderness.     Left Sinus: No maxillary sinus tenderness or frontal sinus tenderness.     Mouth/Throat:     Mouth: Mucous membranes are moist.     Pharynx: Oropharynx is clear. Uvula midline. No pharyngeal swelling, oropharyngeal exudate or posterior oropharyngeal erythema.  Eyes:     General: Lids are normal.        Right eye: No discharge.        Left eye: No discharge.     Extraocular Movements: Extraocular movements intact.     Conjunctiva/sclera: Conjunctivae normal.     Pupils: Pupils are equal, round, and reactive to light.     Visual Fields: Right eye visual fields normal and left eye visual fields normal.  Neck:     Thyroid: No thyromegaly.     Vascular: No carotid bruit.     Trachea: Trachea normal.  Cardiovascular:     Rate and Rhythm: Normal rate and regular rhythm.     Heart sounds: Normal heart sounds. No murmur heard.    No gallop.     Comments: Compression socks on. Pulmonary:     Effort: Pulmonary effort is normal. No accessory muscle usage or respiratory distress.     Breath sounds: Normal breath sounds.  Chest:  Breasts:    Right: Normal.     Left: Normal.  Abdominal:     General: Bowel sounds are normal.     Palpations: Abdomen is soft. There is no hepatomegaly or splenomegaly.     Tenderness: There is no abdominal tenderness.  Musculoskeletal:        General: Normal range of motion.     Cervical back: Normal range of motion and neck supple.     Right lower leg: 1+ Edema present.     Left lower leg: 1+ Edema present.  Lymphadenopathy:     Head:     Right side of head: No submental, submandibular, tonsillar, preauricular or posterior auricular adenopathy.     Left side of head: No submental, submandibular, tonsillar, preauricular or posterior auricular adenopathy.     Cervical: No cervical adenopathy.     Upper Body:     Right upper body: No supraclavicular, axillary or pectoral adenopathy.     Left upper body: No  supraclavicular, axillary or pectoral adenopathy.  Skin:    General: Skin is warm and dry.     Capillary Refill: Capillary refill takes less than 2 seconds.     Findings: No rash.  Neurological:     Mental Status: She is alert and oriented to person, place, and time.     Gait: Gait is intact.     Deep Tendon Reflexes: Reflexes are normal and symmetric.     Reflex Scores:      Brachioradialis reflexes are 2+ on the right side and 2+ on the left side.      Patellar reflexes are 2+ on the right side and 2+ on the left side.  Psychiatric:        Attention and Perception: Attention normal.        Mood and Affect: Mood normal.        Speech: Speech normal.        Behavior: Behavior normal. Behavior is cooperative.        Thought Content: Thought content normal.        Judgment: Judgment normal.    Results for orders placed or performed in visit on 09/26/22  Bayer DCA Hb A1c Waived  Result Value Ref Range   HB A1C (BAYER DCA - WAIVED) 6.1 (H) 4.8 - 5.6 %      Assessment & Plan:   Problem List Items Addressed This Visit       Cardiovascular and Mediastinum   Essential hypertension    Chronic, stable.  BP at goal in office today.  Recommend she monitor BP at home regularly and document.  Will continue Losartan 100 MG daily, Metoprolol, and Lasix 20 MG for edema.  Would avoid Amlodipine due to baseline edema to BLE with lymphedema. Focus on DASH diet at home.  Urine ALB 12 March 2022.  Return in 6 months.  Recommend: - Reminded to call for an overnight weight gain of >2 pounds or a weekly weight weight of >5 pounds - not adding salt to his food and has been reading food labels. Reviewed the importance of keeping daily sodium intake to 2000mg  daily       Relevant Medications   furosemide (LASIX) 20 MG tablet   Other Relevant Orders   CBC with Differential/Platelet   Comprehensive metabolic panel   TSH   SVT (supraventricular tachycardia)    Chronic, stable.  Followed by  cardiology, will continue Metoprolol as ordered by them.  Recent notes and testing reviewed.  She has no cardiac symptoms at this time.      Relevant Medications   furosemide (LASIX) 20 MG tablet   Other Relevant Orders   Comprehensive metabolic panel   Lipid Panel w/o Chol/HDL Ratio     Digestive   Hepatic steatosis    Chronic.  Noted on ultrasound 08/16/21.  Recommend heavy focus on diet and regular exercise. Educated her on this finding.      Relevant Orders   Comprehensive metabolic panel     Other   BMI 50.0-59.9, adult (HCC)    Recommended eating smaller high protein, low fat meals more frequently and exercising 30 mins a day 5 times a week with a goal of 10-15lb weight loss in the next 3 months. Patient voiced their understanding and motivation to adhere to these recommendations.       Elevated hemoglobin A1c measurement    A1c today remains stable at 5.9% recent check, mild upward trend -- recheck today.  Continue heavy focus on prediabetic meal changes.      Relevant Orders   Bayer DCA Hb A1c Waived (Completed)   History of pulmonary embolus (PE)    Stable.  With Covid in September 2021.  At this time will continue Eliquis and collaboration with vascular + cardiology.  Refills up to date.  Cardiology recommends lifelong treatment.      Hyperlipidemia    Chronic, ongoing.  Continue current medication regimen and adjust as needed, Atorvastatin 40 MG.  Lipid panel today.      Relevant Medications   furosemide (LASIX) 20 MG tablet   Other Relevant Orders   Comprehensive metabolic panel   Lipid Panel w/o Chol/HDL Ratio  Lymphedema    Chronic, stable.   Continue to collaborate with vascular as needed and use compression pumps at home + socks when out and about.  She is very consistent with using both.      Morbid obesity due to excess calories (HCC) - Primary    BMI 48.32, has lost 15 pounds since last visit -- praised for this.  Recommended eating smaller high  protein, low fat meals more frequently and exercising 30 mins a day 5 times a week with a goal of 10-15lb weight loss in the next 3 months. Patient voiced their understanding and motivation to adhere to these recommendations.       Secondary hypercoagulable state (HCC)    Taking Eliquis due to past PE.  Continue to monitor closely and check CBC annually.      Relevant Orders   CBC with Differential/Platelet   Vitamin D deficiency    Chronic, stable.  Recommend she continue supplement Vitamin D3 1000 units daily for bone health.  Recheck level today and recommend she schedule DEXA.      Relevant Orders   VITAMIN D 25 Hydroxy (Vit-D Deficiency, Fractures)   Other Visit Diagnoses     Encounter for screening mammogram for malignant neoplasm of breast       Mammogram ordered and instructed on how to schedule.   Relevant Orders   MM 3D SCREENING MAMMOGRAM BILATERAL BREAST   At risk for decreased bone density       DEXA scan ordered and instructed on how to schedule.   Relevant Orders   DG Bone Density   Pneumococcal vaccination given       PCV20 provided today and educated on this.   Relevant Orders   Pneumococcal conjugate vaccine 20-valent (Prevnar 20) (Completed)   Encounter for annual physical exam       Annual physical today with labs and health maintenance reviewed, discussed with patient.        Follow up plan: Return in about 6 months (around 03/29/2023) for HTN/HLD, LYMPHEDEMA.   LABORATORY TESTING:  - Pap smear: not applicable  IMMUNIZATIONS:   - Tdap: Tetanus vaccination status reviewed: last tetanus booster within 10 years. - Influenza: Up to date - Pneumovax: Up to date - Prevnar: Administered today - COVID: Up to date - HPV: Not applicable - Shingrix vaccine: Up to date  SCREENING: -Mammogram: Ordered today  - Colonoscopy: Up to date  - Bone Density: Ordered today  -Hearing Test: Not applicable  -Spirometry: Not applicable   PATIENT COUNSELING:    Advised to take 1 mg of folate supplement per day if capable of pregnancy.   Sexuality: Discussed sexually transmitted diseases, partner selection, use of condoms, avoidance of unintended pregnancy  and contraceptive alternatives.   Advised to avoid cigarette smoking.  I discussed with the patient that most people either abstain from alcohol or drink within safe limits (<=14/week and <=4 drinks/occasion for males, <=7/weeks and <= 3 drinks/occasion for females) and that the risk for alcohol disorders and other health effects rises proportionally with the number of drinks per week and how often a drinker exceeds daily limits.  Discussed cessation/primary prevention of drug use and availability of treatment for abuse.   Diet: Encouraged to adjust caloric intake to maintain  or achieve ideal body weight, to reduce intake of dietary saturated fat and total fat, to limit sodium intake by avoiding high sodium foods and not adding table salt, and to maintain adequate dietary potassium and calcium preferably  from fresh fruits, vegetables, and low-fat dairy products.    Stressed the importance of regular exercise  Injury prevention: Discussed safety belts, safety helmets, smoke detector, smoking near bedding or upholstery.   Dental health: Discussed importance of regular tooth brushing, flossing, and dental visits.    NEXT PREVENTATIVE PHYSICAL DUE IN 1 YEAR. Return in about 6 months (around 03/29/2023) for HTN/HLD, LYMPHEDEMA.

## 2022-09-26 NOTE — Assessment & Plan Note (Signed)
A1c today remains stable at 5.9% recent check, mild upward trend -- recheck today.  Continue heavy focus on prediabetic meal changes.

## 2022-09-26 NOTE — Assessment & Plan Note (Signed)
Taking Eliquis due to past PE.  Continue to monitor closely and check CBC annually.

## 2022-09-26 NOTE — Assessment & Plan Note (Signed)
Chronic.  Noted on ultrasound 08/16/21.  Recommend heavy focus on diet and regular exercise. Educated her on this finding.

## 2022-09-26 NOTE — Assessment & Plan Note (Signed)
BMI 48.32, has lost 15 pounds since last visit -- praised for this.  Recommended eating smaller high protein, low fat meals more frequently and exercising 30 mins a day 5 times a week with a goal of 10-15lb weight loss in the next 3 months. Patient voiced their understanding and motivation to adhere to these recommendations.

## 2022-09-27 ENCOUNTER — Other Ambulatory Visit: Payer: Self-pay | Admitting: Nurse Practitioner

## 2022-09-27 DIAGNOSIS — E876 Hypokalemia: Secondary | ICD-10-CM

## 2022-09-27 LAB — CBC WITH DIFFERENTIAL/PLATELET
Basophils Absolute: 0 10*3/uL (ref 0.0–0.2)
Basos: 0 %
EOS (ABSOLUTE): 0.2 10*3/uL (ref 0.0–0.4)
Eos: 3 %
Hematocrit: 39.3 % (ref 34.0–46.6)
Hemoglobin: 12.4 g/dL (ref 11.1–15.9)
Immature Grans (Abs): 0 10*3/uL (ref 0.0–0.1)
Immature Granulocytes: 0 %
Lymphocytes Absolute: 1.3 10*3/uL (ref 0.7–3.1)
Lymphs: 20 %
MCH: 27.4 pg (ref 26.6–33.0)
MCHC: 31.6 g/dL (ref 31.5–35.7)
MCV: 87 fL (ref 79–97)
Monocytes Absolute: 0.5 10*3/uL (ref 0.1–0.9)
Monocytes: 8 %
Neutrophils Absolute: 4.7 10*3/uL (ref 1.4–7.0)
Neutrophils: 69 %
Platelets: 167 10*3/uL (ref 150–450)
RBC: 4.53 x10E6/uL (ref 3.77–5.28)
RDW: 13.1 % (ref 11.7–15.4)
WBC: 6.8 10*3/uL (ref 3.4–10.8)

## 2022-09-27 LAB — LIPID PANEL W/O CHOL/HDL RATIO
Cholesterol, Total: 116 mg/dL (ref 100–199)
HDL: 47 mg/dL (ref >39)
LDL Chol Calc (NIH): 48 mg/dL (ref 0–99)
Triglycerides: 113 mg/dL (ref 0–149)
VLDL Cholesterol Cal: 21 mg/dL (ref 5–40)

## 2022-09-27 LAB — COMPREHENSIVE METABOLIC PANEL
ALT: 9 IU/L (ref 0–32)
AST: 16 IU/L (ref 0–40)
Albumin: 4.2 g/dL (ref 3.9–4.9)
Alkaline Phosphatase: 98 IU/L (ref 44–121)
BUN/Creatinine Ratio: 24 (ref 12–28)
BUN: 16 mg/dL (ref 8–27)
Bilirubin Total: 1.6 mg/dL — ABNORMAL HIGH (ref 0.0–1.2)
CO2: 28 mmol/L (ref 20–29)
Calcium: 8.9 mg/dL (ref 8.7–10.3)
Chloride: 104 mmol/L (ref 96–106)
Creatinine, Ser: 0.68 mg/dL (ref 0.57–1.00)
Globulin, Total: 2 g/dL (ref 1.5–4.5)
Glucose: 101 mg/dL — ABNORMAL HIGH (ref 70–99)
Potassium: 3.4 mmol/L — ABNORMAL LOW (ref 3.5–5.2)
Sodium: 147 mmol/L — ABNORMAL HIGH (ref 134–144)
Total Protein: 6.2 g/dL (ref 6.0–8.5)
eGFR: 95 mL/min/{1.73_m2} (ref >59)

## 2022-09-27 LAB — VITAMIN D 25 HYDROXY (VIT D DEFICIENCY, FRACTURES): Vit D, 25-Hydroxy: 42.9 ng/mL (ref 30.0–100.0)

## 2022-09-27 LAB — TSH: TSH: 3.27 u[IU]/mL (ref 0.450–4.500)

## 2022-09-27 NOTE — Progress Notes (Signed)
Good morning, please let Jasmine Webb know her labs have returned + need lab visit only 2 weeks: - Kidney and liver function are normal. - Sodium (salt) level is a little elevated and potassium a little low.  I would like her to increase potassium rich foods like bananas, mangoes, avocados, dried fruit, orange juice, potatoes to diet and reduce salt rich foods in diet.  We will recheck this in 2 weeks to see if improving.  Please schedule outpatient lab only visit. - Remainder of labs all look great.  No medication changes at this time.  Any questions? Keep being amazing!!  Thank you for allowing me to participate in your care.  I appreciate you. Kindest regards, Phaedra Colgate

## 2022-10-12 ENCOUNTER — Other Ambulatory Visit: Payer: Medicare Other

## 2022-10-12 DIAGNOSIS — E876 Hypokalemia: Secondary | ICD-10-CM | POA: Diagnosis not present

## 2022-10-13 LAB — BASIC METABOLIC PANEL
BUN/Creatinine Ratio: 22 (ref 12–28)
BUN: 15 mg/dL (ref 8–27)
CO2: 25 mmol/L (ref 20–29)
Calcium: 9.3 mg/dL (ref 8.7–10.3)
Chloride: 107 mmol/L — ABNORMAL HIGH (ref 96–106)
Creatinine, Ser: 0.68 mg/dL (ref 0.57–1.00)
Glucose: 98 mg/dL (ref 70–99)
Potassium: 4 mmol/L (ref 3.5–5.2)
Sodium: 144 mmol/L (ref 134–144)
eGFR: 95 mL/min/{1.73_m2} (ref >59)

## 2022-10-13 NOTE — Progress Notes (Signed)
Good morning, please let Arial know her labs have returned and her sodium and potassium have improved.  Great news!!  No changes needed.  Any questions? Keep being amazing!!  Thank you for allowing me to participate in your care.  I appreciate you. Kindest regards, Robert Sperl

## 2022-10-14 ENCOUNTER — Ambulatory Visit
Admission: RE | Admit: 2022-10-14 | Discharge: 2022-10-14 | Disposition: A | Discharge status: Home / Self Care | Payer: Medicare Other | Source: Ambulatory Visit | Attending: Nurse Practitioner | Admitting: Nurse Practitioner

## 2022-10-14 DIAGNOSIS — Z9189 Other specified personal risk factors, not elsewhere classified: Secondary | ICD-10-CM | POA: Diagnosis not present

## 2022-10-14 DIAGNOSIS — Z13820 Encounter for screening for osteoporosis: Secondary | ICD-10-CM | POA: Insufficient documentation

## 2022-10-14 DIAGNOSIS — Z780 Asymptomatic menopausal state: Secondary | ICD-10-CM | POA: Insufficient documentation

## 2022-10-14 NOTE — Progress Notes (Signed)
Good morning, please let patient know her bone density returned nice and normal.  Great news!!

## 2022-10-25 ENCOUNTER — Other Ambulatory Visit: Payer: Self-pay | Admitting: Nurse Practitioner

## 2022-10-25 NOTE — Telephone Encounter (Signed)
Requested Prescriptions  Refused Prescriptions Disp Refills   atorvastatin (LIPITOR) 40 MG tablet [Pharmacy Med Name: ATORVASTATIN 40 MG TABLET] 90 tablet 4    Sig: TAKE 1 TABLET BY MOUTH EVERY DAY     Cardiovascular:  Antilipid - Statins Failed - 10/25/2022  1:42 AM      Failed - Lipid Panel in normal range within the last 12 months    Cholesterol, Total  Date Value Ref Range Status  09/26/2022 116 100 - 199 mg/dL Final   LDL Chol Calc (NIH)  Date Value Ref Range Status  09/26/2022 48 0 - 99 mg/dL Final   HDL  Date Value Ref Range Status  09/26/2022 47 >39 mg/dL Final   Triglycerides  Date Value Ref Range Status  09/26/2022 113 0 - 149 mg/dL Final         Passed - Patient is not pregnant      Passed - Valid encounter within last 12 months    Recent Outpatient Visits           4 weeks ago Morbid obesity due to excess calories (HCC)   [place] [facility] [place], [place] T, NP   4 months ago Ingrown right greater toenail   [place] [facility] [place], Megan P, DO   4 months ago Ingrown right greater toenail   [place] [facility] [place], [place] T, NP   5 months ago Ingrown toenail with infection   [place] [facility] [place], Clydie Braun, NP   7 months ago Morbid obesity due to excess calories (HCC)   [place] [facility] [place], Corrie Dandy T, NP       Future Appointments             In 5 months Cannady, Blaine T, NP [place] [facility]             losartan (COZAAR) 100 MG tablet [Pharmacy Med Name: LOSARTAN POTASSIUM 100 MG TAB] 90 tablet 4    Sig: TAKE 1 TABLET BY MOUTH EVERY DAY     Cardiovascular:  Angiotensin Receptor Blockers Passed - 10/25/2022  1:42 AM      Passed - Cr in normal range and within 180 days    Creatinine, Ser  Date Value Ref Range Status  10/12/2022 0.68 0.57 - 1.00 mg/dL Final         Passed - K in normal range and within 180 days     Potassium  Date Value Ref Range Status  10/12/2022 4.0 3.5 - 5.2 mmol/L Final         Passed - Patient is not pregnant      Passed - Last BP in normal range    BP Readings from Last 1 Encounters:  09/26/22 128/80         Passed - Valid encounter within last 6 months    Recent Outpatient Visits           4 weeks ago Morbid obesity due to excess calories (HCC)   [place] [facility] [place], Corrie Dandy T, NP   4 months ago Ingrown right greater toenail   [place] [facility] [place], Megan P, DO   4 months ago Ingrown right greater toenail    [facility] [place], [place] T, NP   5 months ago Ingrown toenail with infection    [facility] Larae Grooms, NP   7 months ago Morbid obesity due  to excess calories [facility])   [place] [facility] [place], Dorie Rank, NP       Future Appointments             In 5 months Cannady, Dorie Rank, NP [place] [facility]

## 2023-01-07 ENCOUNTER — Encounter: Payer: Self-pay | Admitting: Emergency Medicine

## 2023-01-07 ENCOUNTER — Ambulatory Visit
Admission: EM | Admit: 2023-01-07 | Discharge: 2023-01-07 | Disposition: A | Discharge status: Home / Self Care | Payer: Medicare Other | Attending: Emergency Medicine | Admitting: Emergency Medicine

## 2023-01-07 DIAGNOSIS — J069 Acute upper respiratory infection, unspecified: Secondary | ICD-10-CM | POA: Diagnosis not present

## 2023-01-07 MED ORDER — PROMETHAZINE-DM 6.25-15 MG/5ML PO SYRP: 5.0000 mL | ORAL_SOLUTION | Freq: Every evening | ORAL | 0 refills | Status: DC | PRN

## 2023-01-07 MED ORDER — BENZONATATE 100 MG PO CAPS: 100.0000 mg | ORAL_CAPSULE | Freq: Three times a day (TID) | ORAL | 0 refills | Status: DC

## 2023-01-07 MED ORDER — AMOXICILLIN-POT CLAVULANATE 875-125 MG PO TABS: 1.0000 | ORAL_TABLET | Freq: Two times a day (BID) | ORAL | 0 refills | Status: DC

## 2023-01-07 MED ORDER — PREDNISONE 10 MG (21) PO TBPK: ORAL_TABLET | Freq: Every day | ORAL | 0 refills | Status: DC

## 2023-01-07 NOTE — ED Provider Notes (Signed)
Jasmine Webb    CSN: [id number] Arrival date & time: 01/07/23  4098      History   Chief Complaint Chief Complaint  Patient presents with   Chills   Sore Throat   Diarrhea    HPI Jasmine Webb is a 67 y.o. female.   Patient presents for evaluation of fever peaking at 100.1, chills, sore throat, nasal congestion, rhinorrhea, and a nonproductive cough and diarrhea beginning 4 days ago.  Intermittently wheezing prior to coughing.  Endorses symptoms worsening at nighttime, describing chest tightness centralized when lying flat.  Decreased appetite but tolerating food and liquids.  Has attempted use of Tylenol extra strength.  Denies shortness of breath, nausea or vomiting.  Known sick contact with exposure to flu and pneumonia.  Past Medical History:  Diagnosis Date   Arthritis    History of emergence delirium    Hyperlipidemia    Hypertension    Tumor    breast and pelvic   Vertigo     Patient Active Problem List   Diagnosis Date Noted   Polyp of transverse colon 07/20/2022   Hepatic steatosis 08/16/2021   S/P laparoscopic cholecystectomy 08/09/2021   Secondary hypercoagulable state (HCC) 08/07/2021   Mild tricuspid regurgitation by prior echocardiogram 02/08/2021   Elevated hemoglobin A1c measurement 02/08/2021   DNR (do not resuscitate) 08/04/2020   SVT (supraventricular tachycardia) (HCC) 08/03/2020   Mild mitral regurgitation by prior echocardiogram 06/23/2020   Lymphedema 11/19/2019   History of pulmonary embolus (PE) 10/10/2019   Morbid obesity due to excess calories (HCC) 08/09/2019   Hyperlipidemia 07/05/2019   Vitamin D deficiency 07/05/2019   Essential hypertension 04/16/2019   Preventative health care 04/16/2019   Allergic rhinitis 04/16/2019   Arthritis 04/16/2019   BMI 50.0-59.9, adult (HCC) 04/16/2019    Past Surgical History:  Procedure Laterality Date   ABDOMINAL HYSTERECTOMY  2003   BREAST SURGERY     CHOLECYSTECTOMY     COLONOSCOPY  WITH PROPOFOL N/A 07/20/2022   Procedure: COLONOSCOPY WITH PROPOFOL;  Surgeon: Midge Minium, MD;  Location: [facility] ENDOSCOPY;  Service: Endoscopy;  Laterality: N/A;   HEMOSTASIS CLIP PLACEMENT  07/20/2022   Procedure: HEMOSTASIS CLIP PLACEMENT;  Surgeon: Midge Minium, MD;  Location: [facility] ENDOSCOPY;  Service: Endoscopy;;   POLYPECTOMY  07/20/2022   Procedure: POLYPECTOMY;  Surgeon: Midge Minium, MD;  Location: [facility] ENDOSCOPY;  Service: Endoscopy;;   PULMONARY THROMBECTOMY N/A 10/10/2019   Procedure: PULMONARY THROMBECTOMY;  Surgeon: Annice Needy, MD;  Location: [facility] INVASIVE CV LAB;  Service: Cardiovascular;  Laterality: N/A;   SUBMUCOSAL TATTOO INJECTION  07/20/2022   Procedure: SUBMUCOSAL TATTOO INJECTION;  Surgeon: Midge Minium, MD;  Location: [facility] ENDOSCOPY;  Service: Endoscopy;;    OB History   No obstetric history on file.      Home Medications    Prior to Admission medications   Medication Sig Start Date End Date Taking? Authorizing Provider  amoxicillin-clavulanate (AUGMENTIN) 875-125 MG tablet Take 1 tablet by mouth every 12 (twelve) hours. 01/13/23  Yes Neilan Rizzo R, NP  benzonatate (TESSALON) 100 MG capsule Take 1 capsule (100 mg total) by mouth every 8 (eight) hours. 01/07/23  Yes Artavius Stearns R, NP  predniSONE (STERAPRED UNI-PAK 21 TAB) 10 MG (21) TBPK tablet Take by mouth daily. Take 6 tabs by mouth daily  for 1 days, then 5 tabs for 1 days, then 4 tabs for 1 days, then 3 tabs for 1 days, 2 tabs for 1 days, then 1 tab by mouth daily  for 1 days 01/07/23  Yes Cariann Kinnamon, Elita Boone, NP  promethazine-dextromethorphan (PROMETHAZINE-DM) 6.25-15 MG/5ML syrup Take 5 mLs by mouth at bedtime as needed for cough. 01/07/23  Yes Camrin Lapre, Elita Boone, NP  apixaban (ELIQUIS) 5 MG TABS tablet Take 1 tablet (5 mg total) by mouth 2 (two) times daily. 02/22/22   Cannady, Corrie Dandy T, NP  atorvastatin (LIPITOR) 40 MG tablet Take 1 tablet (40 mg total) by mouth daily. 03/28/22   Cannady, Corrie Dandy T, NP   fluticasone (FLONASE) 50 MCG/ACT nasal spray Place 2 sprays into both nostrils daily.    [provider]  furosemide (LASIX) 20 MG tablet TAKE 1 TABLET BY MOUTH TWICE A DAY AS NEEDED FOR EDEMA 09/26/22   Cannady, Jolene T, NP  levocetirizine (XYZAL) 5 MG tablet TAKE 1 TABLET BY MOUTH EVERY DAY IN THE EVENING 09/26/22   Cannady, Jolene T, NP  losartan (COZAAR) 100 MG tablet Take 1 tablet (100 mg total) by mouth daily. 03/28/22   Cannady, Corrie Dandy T, NP  metoprolol tartrate (LOPRESSOR) 25 MG tablet Take 1 tablet (25 mg total) by mouth 2 (two) times daily. 03/28/22   [name] T, NP  mupirocin ointment (BACTROBAN) 2 % APPLY TO AFFECTED AREA TWICE A DAY 08/11/22   Marjie Skiff, NP    Family History Family History  Problem Relation Age of Onset   COPD Mother    ALS Father    Heart disease Sister    Breast cancer Sister    Colon cancer Sister    Diabetes Brother    Hypertension Brother    Asthma Son    Sinusitis Son    Congestive Heart Failure Maternal Uncle     Social History Social History   Tobacco Use   Smoking status: Never   Smokeless tobacco: Never  Vaping Use   Vaping status: Never Used  Substance Use Topics   Alcohol use: Never   Drug use: Never     Allergies   Fish allergy and Ibuprofen   Review of Systems Review of Systems   Physical Exam Triage Vital Signs ED Triage Vitals  Encounter Vitals Group     BP 01/07/23 1030 136/75     Systolic BP Percentile --      Diastolic BP Percentile --      Pulse Rate 01/07/23 1030 68     Resp 01/07/23 1030 18     Temp 01/07/23 1030 98.1 F (36.7 C)     Temp Source 01/07/23 1030 Oral     SpO2 01/07/23 1030 94 %     Weight --      Height --      Head Circumference --      Peak Flow --      Pain Score 01/07/23 1029 0     Pain Loc --      Pain Education --      Exclude from Growth Chart --    No data found.  Updated Vital Signs BP 136/75 (BP Location: Left Wrist)   Pulse 68   Temp 98.1 F (36.7  C) (Oral)   Resp 18   LMP  (LMP Unknown)   SpO2 94%   Visual Acuity Right Eye Distance:   Left Eye Distance:   Bilateral Distance:    Right Eye Near:   Left Eye Near:    Bilateral Near:     Physical Exam Constitutional:      Appearance: Normal appearance.  HENT:     Head: Normocephalic.  Right Ear: Tympanic membrane, ear canal and external ear normal.     Left Ear: Tympanic membrane, ear canal and external ear normal.     Nose: Congestion present. No rhinorrhea.     Mouth/Throat:     Mouth: Mucous membranes are moist.     Pharynx: Oropharynx is clear. Posterior oropharyngeal erythema present. No oropharyngeal exudate.  Eyes:     Extraocular Movements: Extraocular movements intact.  Cardiovascular:     Rate and Rhythm: Normal rate and regular rhythm.     Pulses: Normal pulses.     Heart sounds: Normal heart sounds.  Pulmonary:     Effort: Pulmonary effort is normal.     Breath sounds: Normal breath sounds.  Musculoskeletal:     Cervical back: Normal range of motion and neck supple.  Neurological:     Mental Status: She is alert and oriented to person, place, and time. Mental status is at baseline.      UC Treatments / Results  Labs (all labs ordered are listed, but only abnormal results are displayed) Labs Reviewed - No data to display  EKG   Radiology No results found.  Procedures Procedures (including critical care time)  Medications Ordered in UC Medications - No data to display  Initial Impression / Assessment and Plan / UC Course  I have reviewed the triage vital signs and the nursing notes.  Pertinent labs & imaging results that were available during my care of the patient were reviewed by me and considered in my medical decision making (see chart for details).  Viral URI with cough  Patient is in no signs of distress nor toxic appearing.  Vital signs are stable.  Low suspicion for pneumonia, pneumothorax or bronchitis and therefore will  defer imaging.  Past 3-day window for Tamiflu therefore flu testing deferred.  Prescribed prednisone, Tessalon and Promethazine DM, watch wait antibiotic placed at pharmacy for day 10 of illness if no improvement seen, Augmentin prescribed.May use additional over-the-counter medications as needed for supportive care.  May follow-up with urgent care as needed if symptoms persist or worsen.  Final Clinical Impressions(s) / UC Diagnoses   Final diagnoses:  Viral URI with cough     Discharge Instructions      Your symptoms today are most likely being caused by a virus and should steadily improve in time it can take up to 7 to 10 days before you truly start to see a turnaround however things will get better if no improvement seen by December 13 you may pick up Augmentin from the pharmacy for bacterial coverage  In the meantime begin prednisone every morning with food as directed to open and relax the airway should settle wheezing, chest tightness  You may use Tessalon pill every 8 hours as needed to help calm your cough, may use cough syrup at bedtime to allow for rest    You can take Tylenol and/or Ibuprofen as needed for fever reduction and pain relief.   For cough: honey 1/2 to 1 teaspoon (you can dilute the honey in water or another fluid).  You can also use guaifenesin and dextromethorphan for cough. You can use a humidifier for chest congestion and cough.  If you don't have a humidifier, you can sit in the bathroom with the hot shower running.      For sore throat: try warm salt water gargles, cepacol lozenges, throat spray, warm tea or water with lemon/honey, popsicles or ice, or OTC cold relief medicine for throat discomfort.  For congestion: take a daily anti-histamine like Zyrtec, Claritin, and a oral decongestant, such as pseudoephedrine.  You can also use Flonase 1-2 sprays in each nostril daily.   It is important to stay hydrated: drink plenty of fluids (water,  gatorade/powerade/pedialyte, juices, or teas) to keep your throat moisturized and help further relieve irritation/discomfort.    ED Prescriptions     Medication Sig Dispense Auth. Provider   predniSONE (STERAPRED UNI-PAK 21 TAB) 10 MG (21) TBPK tablet Take by mouth daily. Take 6 tabs by mouth daily  for 1 days, then 5 tabs for 1 days, then 4 tabs for 1 days, then 3 tabs for 1 days, 2 tabs for 1 days, then 1 tab by mouth daily for 1 days 21 tablet Shanyiah Conde R, NP   benzonatate (TESSALON) 100 MG capsule Take 1 capsule (100 mg total) by mouth every 8 (eight) hours. 21 capsule Chantrice Hagg R, NP   promethazine-dextromethorphan (PROMETHAZINE-DM) 6.25-15 MG/5ML syrup Take 5 mLs by mouth at bedtime as needed for cough. 118 mL Aliza Moret R, NP   amoxicillin-clavulanate (AUGMENTIN) 875-125 MG tablet Take 1 tablet by mouth every 12 (twelve) hours. 14 tablet Blu Lori, Elita Boone, NP      PDMP not reviewed this encounter.   Valinda Hoar, NP 01/07/23 1136

## 2023-01-07 NOTE — ED Triage Notes (Signed)
Pt presents with chills, sore throat, diarrhea, cough, and congestions x 4 days. Her family that lives with her was diagnosed with flu and pneumonia.

## 2023-01-07 NOTE — Discharge Instructions (Signed)
Your symptoms today are most likely being caused by a virus and should steadily improve in time it can take up to 7 to 10 days before you truly start to see a turnaround however things will get better if no improvement seen by December 13 you may pick up Augmentin from the pharmacy for bacterial coverage  In the meantime begin prednisone every morning with food as directed to open and relax the airway should settle wheezing, chest tightness  You may use Tessalon pill every 8 hours as needed to help calm your cough, may use cough syrup at bedtime to allow for rest    You can take Tylenol and/or Ibuprofen as needed for fever reduction and pain relief.   For cough: honey 1/2 to 1 teaspoon (you can dilute the honey in water or another fluid).  You can also use guaifenesin and dextromethorphan for cough. You can use a humidifier for chest congestion and cough.  If you don't have a humidifier, you can sit in the bathroom with the hot shower running.      For sore throat: try warm salt water gargles, cepacol lozenges, throat spray, warm tea or water with lemon/honey, popsicles or ice, or OTC cold relief medicine for throat discomfort.   For congestion: take a daily anti-histamine like Zyrtec, Claritin, and a oral decongestant, such as pseudoephedrine.  You can also use Flonase 1-2 sprays in each nostril daily.   It is important to stay hydrated: drink plenty of fluids (water, gatorade/powerade/pedialyte, juices, or teas) to keep your throat moisturized and help further relieve irritation/discomfort.

## 2023-01-11 ENCOUNTER — Encounter: Payer: Self-pay | Admitting: Nurse Practitioner

## 2023-01-11 ENCOUNTER — Ambulatory Visit
Admission: RE | Admit: 2023-01-11 | Discharge: 2023-01-11 | Disposition: A | Discharge status: Home / Self Care | Payer: Medicare Other | Attending: Nurse Practitioner | Admitting: Nurse Practitioner

## 2023-01-11 ENCOUNTER — Other Ambulatory Visit: Payer: Self-pay | Admitting: Nurse Practitioner

## 2023-01-11 ENCOUNTER — Ambulatory Visit (INDEPENDENT_AMBULATORY_CARE_PROVIDER_SITE_OTHER): Payer: Medicare Other | Admitting: Nurse Practitioner

## 2023-01-11 ENCOUNTER — Ambulatory Visit
Admission: RE | Admit: 2023-01-11 | Discharge: 2023-01-11 | Disposition: A | Discharge status: Home / Self Care | Payer: Medicare Other | Source: Ambulatory Visit | Attending: Nurse Practitioner | Admitting: Nurse Practitioner

## 2023-01-11 VITALS — BP 110/70 | HR 64 | Temp 97.8°F | Wt 307.4 lb

## 2023-01-11 DIAGNOSIS — R052 Subacute cough: Secondary | ICD-10-CM | POA: Diagnosis not present

## 2023-01-11 DIAGNOSIS — R059 Cough, unspecified: Secondary | ICD-10-CM | POA: Diagnosis not present

## 2023-01-11 NOTE — Progress Notes (Signed)
Please let Kloie know imaging has returned and no pneumonia is present.  Good news!!  Continue current treatment plan.:)

## 2023-01-11 NOTE — Progress Notes (Signed)
BP 110/70   Pulse 64   Temp 97.8 F (36.6 C) (Oral)   Wt (!) 307 lb 6.4 oz (139.4 kg)   LMP  (LMP Unknown)   SpO2 97%   BMI 54.45 kg/m    Subjective:    Patient ID: Jasmine Webb, female    DOB: November 04, 1955, 67 y.o.   MRN: [id number]  HPI: Jasmine Webb is a 67 y.o. female  Chief Complaint  Patient presents with   URI    Patient states she is having a lot more congestion than when she went to [facility] 01/07/23. States she has been taking the Tessalon and Prednisone. Augmentin was prescribed but patient was instructed to not start it until the 13th if she wasn't feeling better.    UPPER RESPIRATORY TRACT INFECTION Started with symptoms over a week ago.  Went to urgent care on 01/07/23 and was provided Prednisone, Tessalon, + Augmentin (which is to not start until the 13th).  Wants to make sure no PNA as coughing more. Fever:  only at beginning Cough: yes Shortness of breath: no Wheezing: yes Chest pain: no Chest tightness: yes Chest congestion: yes Nasal congestion: no Runny nose: yes Post nasal drip: yes Sneezing: no Sore throat: no Swollen glands: no Sinus pressure: yes Headache: yes Face pain: no Toothache: no Ear pain: none Ear pressure: none Eyes red/itching:no Eye drainage/crusting: no  Vomiting: no Rash: no Fatigue: yes Sick contacts: yes whole household Strep contacts: no  Context: fluctuating Recurrent sinusitis: no Relief with OTC cold/cough medications: yes  Treatments attempted: Prednisone and Tessalon    Relevant past medical, surgical, family and social history reviewed and updated as indicated. Interim medical history since our last visit reviewed. Allergies and medications reviewed and updated.  Review of Systems  Constitutional:  Positive for fatigue. Negative for activity change, appetite change, chills and fever.  HENT:  Positive for postnasal drip, rhinorrhea and sinus pressure. Negative for congestion, ear discharge, ear pain, facial swelling,  sinus pain, sneezing, sore throat and voice change.   Eyes:  Negative for pain and visual disturbance.  Respiratory:  Positive for cough, chest tightness and wheezing. Negative for shortness of breath.   Cardiovascular:  Negative for chest pain, palpitations and leg swelling.  Gastrointestinal: Negative.   Endocrine: Negative.   Neurological:  Positive for headaches. Negative for dizziness and numbness.  Psychiatric/Behavioral: Negative.      Per HPI unless specifically indicated above     Objective:    BP 110/70   Pulse 64   Temp 97.8 F (36.6 C) (Oral)   Wt (!) 307 lb 6.4 oz (139.4 kg)   LMP  (LMP Unknown)   SpO2 97%   BMI 54.45 kg/m   Wt Readings from Last 3 Encounters:  01/11/23 (!) 307 lb 6.4 oz (139.4 kg)  09/26/22 272 lb 12.8 oz (123.7 kg)  07/20/22 287 lb (130.2 kg)    Physical Exam Vitals and nursing note reviewed.  Constitutional:      General: She is awake. She is not in acute distress.    Appearance: She is well-developed and well-groomed. She is obese. She is ill-appearing. She is not toxic-appearing.  HENT:     Head: Normocephalic.     Right Ear: Hearing, ear canal and external ear normal. A middle ear effusion is present. Tympanic membrane is not injected or perforated.     Left Ear: Hearing, ear canal and external ear normal. A middle ear effusion is present. Tympanic membrane is not injected or  perforated.     Nose: Rhinorrhea present. Rhinorrhea is clear.     Right Sinus: No maxillary sinus tenderness or frontal sinus tenderness.     Left Sinus: No maxillary sinus tenderness or frontal sinus tenderness.     Mouth/Throat:     Mouth: Mucous membranes are moist.     Pharynx: Posterior oropharyngeal erythema (mild) present. No pharyngeal swelling or oropharyngeal exudate.  Eyes:     General: Lids are normal.        Right eye: No discharge.        Left eye: No discharge.     Conjunctiva/sclera: Conjunctivae normal.     Pupils: Pupils are equal, round, and  reactive to light.  Neck:     Thyroid: No thyromegaly.     Vascular: No carotid bruit.  Cardiovascular:     Rate and Rhythm: Normal rate and regular rhythm.     Heart sounds: Normal heart sounds. No murmur heard.    No gallop.  Pulmonary:     Effort: Pulmonary effort is normal. No accessory muscle usage or respiratory distress.     Breath sounds: No decreased breath sounds, wheezing or rhonchi.     Comments: Overall no wheezes or rhonchi noted throughout.  Intermittent cough present. Abdominal:     General: Bowel sounds are normal.     Palpations: Abdomen is soft. There is no hepatomegaly or splenomegaly.  Musculoskeletal:     Cervical back: Normal range of motion and neck supple.     Right lower leg: No edema.     Left lower leg: No edema.  Lymphadenopathy:     Head:     Right side of head: No submental, submandibular, tonsillar, preauricular or posterior auricular adenopathy.     Left side of head: No submental, submandibular, tonsillar, preauricular or posterior auricular adenopathy.     Cervical: No cervical adenopathy.  Skin:    General: Skin is warm and dry.  Neurological:     Mental Status: She is alert and oriented to person, place, and time.  Psychiatric:        Attention and Perception: Attention normal.        Mood and Affect: Mood normal.        Speech: Speech normal.        Behavior: Behavior normal. Behavior is cooperative.        Thought Content: Thought content normal.     Results for orders placed or performed in visit on 10/12/22  Basic Metabolic Panel (BMET)  Result Value Ref Range   Glucose 98 70 - 99 mg/dL   BUN 15 8 - 27 mg/dL   Creatinine, Ser 0.34 0.57 - 1.00 mg/dL   eGFR 95 >74 QV/ZDG/3.87   BUN/Creatinine Ratio 22 12 - 28   Sodium 144 134 - 144 mmol/L   Potassium 4.0 3.5 - 5.2 mmol/L   Chloride 107 (H) 96 - 106 mmol/L   CO2 25 20 - 29 mmol/L   Calcium 9.3 8.7 - 10.3 mg/dL      Assessment & Plan:   Problem List Items Addressed This Visit        Other   Subacute cough - Primary    Present for over one week, currently on Prednisone and Tesslon from UC. To start Augmentin on 13th.  She is higher risk, will obtain CXR to ensure no PNA presenting.  If present would need second abx sent in.  Recommend: - Increased rest - Increasing Fluids - Acetaminophen as  needed for fever/pain.  - Salt water gargling, chloraseptic spray and throat lozenge - Mucinex.  - Humidifying the air.       Relevant Orders   DG Chest 2 View     Follow up plan: Return in about 2 weeks (around 01/25/2023) for Cough.

## 2023-01-11 NOTE — Assessment & Plan Note (Signed)
Present for over one week, currently on Prednisone and Tesslon from UC. To start Augmentin on 13th.  She is higher risk, will obtain CXR to ensure no PNA presenting.  If present would need second abx sent in.  Recommend: - Increased rest - Increasing Fluids - Acetaminophen as needed for fever/pain.  - Salt water gargling, chloraseptic spray and throat lozenge - Mucinex.  - Humidifying the air.

## 2023-01-11 NOTE — Patient Instructions (Signed)
IMAGING LOCATION: [address], [place], [place] [address] [phone]   Cough, Adult A cough helps to clear your throat and lungs. It may be a sign of an illness or another condition. A short-term (acute) cough may last 2-3 weeks. A long-term (chronic) cough may last 8 or more weeks. Many things can cause a cough. They include: Illnesses such as: An infection in your throat or lungs. Asthma or other heart or lung problems. Gastroesophageal reflux. This is when acid comes back up from your stomach. Breathing in things that bother (irritate) your lungs. Allergies. Postnasal drip. This is when mucus runs down the back of your throat. Smoking. Some medicines. Follow these instructions at home: Medicines Take over-the-counter and prescription medicines only as told by your doctor. Talk with your doctor before you take cough medicine (cough suppressants). Eating and drinking Do not drink alcohol. Do not drink caffeine. Drink enough fluid to keep your pee (urine) pale yellow. Lifestyle Stay away from cigarette smoke. Do not smoke or use any products that contain nicotine or tobacco. If you need help quitting, ask your doctor. Stay away from things that make you cough. These may include perfume, candles, cleaning products, or campfire smoke. General instructions  Watch for any changes to your cough. Tell your doctor about them. Always cover your mouth when you cough. If the air is dry in your home, use a cool mist vaporizer or humidifier. If your cough is worse at night, try using extra pillows to raise your head up higher while you sleep. Rest as needed. Contact a doctor if: You have new symptoms. Your symptoms get worse. You cough up pus. You have a fever that does not go away. Your cough does not get better after 2-3 weeks. Cough medicine does not help, and you are not sleeping well. You have pain that gets worse or is not helped with medicine. You are losing  weight and do not know why. You have night sweats. Get help right away if: You cough up blood. You have trouble breathing. Your heart is beating very fast. These symptoms may be an emergency. Get help right away. Call 911. Do not wait to see if the symptoms will go away. Do not drive yourself to the hospital. This information is not intended to replace advice given to you by your health care provider. Make sure you discuss any questions you have with your health care provider. Document Revised: 09/17/2021 Document Reviewed: 09/17/2021 Elsevier Patient Education  2024 [employer].

## 2023-01-11 NOTE — Telephone Encounter (Signed)
Medication Refill - Most Recent Primary Care Visit:  Provider: [name] T  Department: [facility]  Visit Type: OFFICE VISIT  Date: 01/11/2023  Medication: amoxicillin-clavulanate (AUGMENTIN) 875-125 MG tablet   Has the patient contacted their pharmacy? Yes Pt went to urgent care on Saturday and pharmacy is stating that they do not have prescription, system shows it was sent on Saturday at 10:57 am. Pt went to see PCP today and per PCP had pt to check at pharmacy to see if it was there, medication is not there. PCP advise pt to call back if medication is not at pharmacy. Please advise.  This is the patient's preferred pharmacy:  [id number] - [address] [place] [address] Phone: [phone] Fax: [phone]   Has the prescription been filled recently? No (not at pharmacy)  Is the patient out of the medication? Yes  Has the patient been seen for an appointment in the last year OR does the patient have an upcoming appointment? Yes  Can we respond through MyChart? No  Agent: Please be advised that Rx refills may take up to 3 business days. We ask that you follow-up with your pharmacy. \

## 2023-01-12 MED ORDER — AMOXICILLIN-POT CLAVULANATE 875-125 MG PO TABS: 1.0000 | ORAL_TABLET | Freq: Two times a day (BID) | ORAL | 0 refills | Status: DC

## 2023-01-24 NOTE — Patient Instructions (Signed)

## 2023-01-27 ENCOUNTER — Ambulatory Visit (INDEPENDENT_AMBULATORY_CARE_PROVIDER_SITE_OTHER): Payer: Medicare Other | Admitting: Nurse Practitioner

## 2023-01-27 ENCOUNTER — Encounter: Payer: Self-pay | Admitting: Nurse Practitioner

## 2023-01-27 VITALS — BP 107/76 | HR 97 | Temp 97.3°F | Wt 299.0 lb

## 2023-01-27 DIAGNOSIS — R052 Subacute cough: Secondary | ICD-10-CM

## 2023-01-27 NOTE — Assessment & Plan Note (Signed)
Acute and improved at this time.  She does have some ear pain, but overall no infection noted on exam -- no redness to TM or drainage.  Recommend she take her allergy medication daily, as suspect current symptoms related more to allergies.  If worsening return to office.

## 2023-01-27 NOTE — Progress Notes (Signed)
BP 107/76   Pulse 97   Temp (!) 97.3 F (36.3 C) (Oral)   Wt 299 lb (135.6 kg)   LMP  (LMP Unknown)   SpO2 98%   BMI 52.97 kg/m    Subjective:    Patient ID: Jasmine Webb, female    DOB: April 09, 1955, 67 y.o.   MRN: [id number]  HPI: Jasmine Webb is a 67 y.o. female  Chief Complaint  Patient presents with   Cough    2 week f/up- patient states her cough has gotten better but feels like she is starting to have another sinus infection   UPPER RESPIRATORY TRACT INFECTION Follow-up today for cough, which has improved, but now has earache to right side with sinus pain.  Started two days ago.  Recent cough treated with Augmentin. Fever: no Cough: no Shortness of breath: no Wheezing: no Chest pain: no Chest tightness: no Chest congestion: no Nasal congestion: no Runny nose: yes Post nasal drip: yes Sneezing: no Sore throat: no Swollen glands: no Sinus pressure: yes Headache: no Face pain: no Toothache: no Ear pain: yes "right Ear pressure: yes bilateral Eyes red/itching:no Eye drainage/crusting: no  Vomiting: no Rash: no Fatigue: no Sick contacts: no Strep contacts: no  Context: fluctuating, but cough has improved Recurrent sinusitis: no Relief with OTC cold/cough medications: yes Treatments attempted: Tylenol    Relevant past medical, surgical, family and social history reviewed and updated as indicated. Interim medical history since our last visit reviewed. Allergies and medications reviewed and updated.  Review of Systems  Constitutional:  Negative for activity change, appetite change, chills, fatigue and fever.  HENT:  Positive for ear pain and sinus pressure. Negative for congestion, ear discharge, facial swelling, postnasal drip, rhinorrhea, sinus pain, sneezing, sore throat and voice change.   Respiratory:  Negative for cough, chest tightness, shortness of breath and wheezing.   Cardiovascular:  Negative for chest pain, palpitations and leg swelling.   Gastrointestinal: Negative.   Neurological: Negative.   Psychiatric/Behavioral: Negative.      Per HPI unless specifically indicated above     Objective:    BP 107/76   Pulse 97   Temp (!) 97.3 F (36.3 C) (Oral)   Wt 299 lb (135.6 kg)   LMP  (LMP Unknown)   SpO2 98%   BMI 52.97 kg/m   Wt Readings from Last 3 Encounters:  01/27/23 299 lb (135.6 kg)  01/11/23 (!) 307 lb 6.4 oz (139.4 kg)  09/26/22 272 lb 12.8 oz (123.7 kg)    Physical Exam Vitals and nursing note reviewed.  Constitutional:      General: She is awake. She is not in acute distress.    Appearance: She is well-developed and well-groomed. She is obese. She is not ill-appearing or toxic-appearing.  HENT:     Head: Normocephalic.     Right Ear: Hearing, ear canal and external ear normal. A middle ear effusion is present. Tympanic membrane is not injected or perforated.     Left Ear: Hearing, ear canal and external ear normal. A middle ear effusion is present. Tympanic membrane is not injected or perforated.     Nose: No rhinorrhea.     Right Sinus: No maxillary sinus tenderness or frontal sinus tenderness.     Left Sinus: No maxillary sinus tenderness or frontal sinus tenderness.     Mouth/Throat:     Mouth: Mucous membranes are moist.     Pharynx: Posterior oropharyngeal erythema (mild) present. No pharyngeal swelling or oropharyngeal exudate.  Eyes:     General: Lids are normal.        Right eye: No discharge.        Left eye: No discharge.     Conjunctiva/sclera: Conjunctivae normal.     Pupils: Pupils are equal, round, and reactive to light.  Neck:     Thyroid: No thyromegaly.     Vascular: No carotid bruit.  Cardiovascular:     Rate and Rhythm: Normal rate and regular rhythm.     Heart sounds: Normal heart sounds. No murmur heard.    No gallop.  Pulmonary:     Effort: Pulmonary effort is normal. No accessory muscle usage or respiratory distress.     Breath sounds: Normal breath sounds. No  decreased breath sounds, wheezing or rhonchi.  Abdominal:     General: Bowel sounds are normal.     Palpations: Abdomen is soft. There is no hepatomegaly or splenomegaly.  Musculoskeletal:     Cervical back: Normal range of motion and neck supple.     Right lower leg: No edema.     Left lower leg: No edema.  Lymphadenopathy:     Head:     Right side of head: No submental, submandibular, tonsillar, preauricular or posterior auricular adenopathy.     Left side of head: No submental, submandibular, tonsillar, preauricular or posterior auricular adenopathy.     Cervical: No cervical adenopathy.  Skin:    General: Skin is warm and dry.  Neurological:     Mental Status: She is alert and oriented to person, place, and time.  Psychiatric:        Attention and Perception: Attention normal.        Mood and Affect: Mood normal.        Speech: Speech normal.        Behavior: Behavior normal. Behavior is cooperative.        Thought Content: Thought content normal.    Results for orders placed or performed in visit on 10/12/22  Basic Metabolic Panel (BMET)   Collection Time: 10/12/22  8:27 AM  Result Value Ref Range   Glucose 98 70 - 99 mg/dL   BUN 15 8 - 27 mg/dL   Creatinine, Ser 1.61 0.57 - 1.00 mg/dL   eGFR 95 >09 UE/AVW/0.98   BUN/Creatinine Ratio 22 12 - 28   Sodium 144 134 - 144 mmol/L   Potassium 4.0 3.5 - 5.2 mmol/L   Chloride 107 (H) 96 - 106 mmol/L   CO2 25 20 - 29 mmol/L   Calcium 9.3 8.7 - 10.3 mg/dL      Assessment & Plan:   Problem List Items Addressed This Visit       Other   Subacute cough - Primary   Acute and improved at this time.  She does have some ear pain, but overall no infection noted on exam -- no redness to TM or drainage.  Recommend she take her allergy medication daily, as suspect current symptoms related more to allergies.  If worsening return to office.         Follow up plan: Return for as scheduled February 26th.

## 2023-03-06 ENCOUNTER — Ambulatory Visit: Payer: 59 | Admitting: Family Medicine

## 2023-03-06 ENCOUNTER — Encounter: Payer: Self-pay | Admitting: Family Medicine

## 2023-03-06 ENCOUNTER — Ambulatory Visit: Payer: Self-pay | Admitting: *Deleted

## 2023-03-06 VITALS — BP 130/68 | HR 86 | Temp 98.2°F | Resp 16 | Ht 63.0 in | Wt 302.0 lb

## 2023-03-06 DIAGNOSIS — R6889 Other general symptoms and signs: Secondary | ICD-10-CM | POA: Diagnosis not present

## 2023-03-06 DIAGNOSIS — H6121 Impacted cerumen, right ear: Secondary | ICD-10-CM | POA: Insufficient documentation

## 2023-03-06 DIAGNOSIS — R0989 Other specified symptoms and signs involving the circulatory and respiratory systems: Secondary | ICD-10-CM

## 2023-03-06 MED ORDER — OSELTAMIVIR PHOSPHATE 75 MG PO CAPS: 75.0000 mg | ORAL_CAPSULE | Freq: Two times a day (BID) | ORAL | 0 refills | Status: AC

## 2023-03-06 MED ORDER — PROMETHAZINE-DM 6.25-15 MG/5ML PO SYRP: 2.5000 mL | ORAL_SOLUTION | Freq: Four times a day (QID) | ORAL | 0 refills | Status: DC | PRN

## 2023-03-06 NOTE — Patient Instructions (Signed)

## 2023-03-06 NOTE — Progress Notes (Signed)
Patient ID: Jasmine Webb, female    DOB: 1955-04-17, 68 y.o.   MRN: [id number]  PCP: Marjie Skiff, NP  Chief Complaint  Patient presents with   Headache   Generalized Body Aches    Started yesterday afternoon    Subjective:   Jasmine Webb is a 68 y.o. female, presents to clinic with CC of the following:  HPI  Presents with headache, body aches and subjective fever Afebrile here today Concern with exposure to family members with influenza - son/grandson dx with flu in the past 2 days sick for 3-4 days, her symptoms started yesterday Allergy to flu shot Out of flu tests in clinic    Patient Active Problem List   Diagnosis Date Noted   Subacute cough 01/11/2023   Polyp of transverse colon 07/20/2022   Hepatic steatosis 08/16/2021   S/P laparoscopic cholecystectomy 08/09/2021   Secondary hypercoagulable state (HCC) 08/07/2021   Mild tricuspid regurgitation by prior echocardiogram 02/08/2021   Elevated hemoglobin A1c measurement 02/08/2021   DNR (do not resuscitate) 08/04/2020   SVT (supraventricular tachycardia) (HCC) 08/03/2020   Mild mitral regurgitation by prior echocardiogram 06/23/2020   Lymphedema 11/19/2019   History of pulmonary embolus (PE) 10/10/2019   Morbid obesity due to excess calories (HCC) 08/09/2019   Hyperlipidemia 07/05/2019   Vitamin D deficiency 07/05/2019   Essential hypertension 04/16/2019   Preventative health care 04/16/2019   Allergic rhinitis 04/16/2019   Arthritis 04/16/2019   BMI 50.0-59.9, adult (HCC) 04/16/2019      Current Outpatient Medications:    apixaban (ELIQUIS) 5 MG TABS tablet, Take 1 tablet (5 mg total) by mouth 2 (two) times daily., Disp: 180 tablet, Rfl: 4   atorvastatin (LIPITOR) 40 MG tablet, Take 1 tablet (40 mg total) by mouth daily., Disp: 90 tablet, Rfl: 4   fluticasone (FLONASE) 50 MCG/ACT nasal spray, Place 2 sprays into both nostrils daily., Disp: , Rfl:    furosemide (LASIX) 20 MG tablet, TAKE 1 TABLET BY  MOUTH TWICE A DAY AS NEEDED FOR EDEMA, Disp: 180 tablet, Rfl: 4   levocetirizine (XYZAL) 5 MG tablet, TAKE 1 TABLET BY MOUTH EVERY DAY IN THE EVENING, Disp: 90 tablet, Rfl: 4   losartan (COZAAR) 100 MG tablet, Take 1 tablet (100 mg total) by mouth daily., Disp: 90 tablet, Rfl: 4   metoprolol tartrate (LOPRESSOR) 25 MG tablet, Take 1 tablet (25 mg total) by mouth 2 (two) times daily., Disp: 180 tablet, Rfl: 4   mupirocin ointment (BACTROBAN) 2 %, APPLY TO AFFECTED AREA TWICE A DAY, Disp: 22 g, Rfl: 3   Allergies  Allergen Reactions   Fish Allergy    Ibuprofen     Told to avoid ibuprofen related products by doctor.     Social History   Tobacco Use   Smoking status: Never   Smokeless tobacco: Never  Vaping Use   Vaping status: Never Used  Substance Use Topics   Alcohol use: Never   Drug use: Never      Chart Review Today: I personally reviewed active problem list, medication list, allergies, family history, social history, health maintenance, notes from last encounter, lab results, imaging with the patient/caregiver today.   Review of Systems  Constitutional: Negative.   HENT: Negative.    Eyes: Negative.   Respiratory: Negative.    Cardiovascular: Negative.   Gastrointestinal: Negative.   Endocrine: Negative.   Genitourinary: Negative.   Musculoskeletal: Negative.   Skin: Negative.   Allergic/Immunologic: Negative.   Neurological: Negative.  Hematological: Negative.   Psychiatric/Behavioral: Negative.    All other systems reviewed and are negative.      Objective:   Vitals:   03/06/23 0920  BP: 130/68  Pulse: 86  Resp: 16  Temp: 98.2 F (36.8 C)  TempSrc: Oral  SpO2: 98%  Weight: (!) 302 lb (137 kg)  Height: 5\' 3"  (1.6 m)    Body mass index is 53.5 kg/m.  Physical Exam Vitals and nursing note reviewed.  Constitutional:      General: She is not in acute distress.    Appearance: Normal appearance. She is well-developed. She is obese. She is not  ill-appearing, toxic-appearing or diaphoretic.  HENT:     Head: Normocephalic and atraumatic.     Right Ear: External ear normal. There is impacted cerumen.     Left Ear: Tympanic membrane, ear canal and external ear normal. There is no impacted cerumen.     Nose: Nose normal.  Eyes:     General:        Right eye: No discharge.        Left eye: No discharge.     Conjunctiva/sclera: Conjunctivae normal.  Neck:     Trachea: No tracheal deviation.  Cardiovascular:     Rate and Rhythm: Normal rate and regular rhythm.     Pulses: Normal pulses.     Heart sounds: Normal heart sounds. No murmur heard.    No friction rub. No gallop.  Pulmonary:     Effort: Pulmonary effort is normal. No accessory muscle usage, respiratory distress or retractions.     Breath sounds: Normal breath sounds. No stridor. No wheezing, rhonchi or rales.     Comments: Able to speak in full and complete sentences Skin:    General: Skin is warm and dry.     Findings: No rash.  Neurological:     Mental Status: She is alert.     Motor: No abnormal muscle tone.     Coordination: Coordination normal.  Psychiatric:        Mood and Affect: Mood normal.        Behavior: Behavior normal.      Results for orders placed or performed in visit on 10/12/22  Basic Metabolic Panel (BMET)   Collection Time: 10/12/22  8:27 AM  Result Value Ref Range   Glucose 98 70 - 99 mg/dL   BUN 15 8 - 27 mg/dL   Creatinine, Ser 8.11 0.57 - 1.00 mg/dL   eGFR 95 >91 YN/WGN/5.62   BUN/Creatinine Ratio 22 12 - 28   Sodium 144 134 - 144 mmol/L   Potassium 4.0 3.5 - 5.2 mmol/L   Chloride 107 (H) 96 - 106 mmol/L   CO2 25 20 - 29 mmol/L   Calcium 9.3 8.7 - 10.3 mg/dL       Assessment & Plan:   Pt with onset of body aches, HA, subjective fever, mild non-productive cough with close exposure to family member with flu No flu testing, presumed flu, start tamiflu since high risk VSS, lungs CTA A&P Supportive measures reviewed  Encouraged  mucinex, rest, fluids, tylenol for HA/fever F/up precautions also reviewed and pt verbalized understanding    ICD-10-CM   1. Flu-like symptoms  R68.89 oseltamivir (TAMIFLU) 75 MG capsule    2. Suspected novel influenza A virus infection  R09.89 oseltamivir (TAMIFLU) 75 MG capsule    promethazine-dextromethorphan (PROMETHAZINE-DM) 6.25-15 MG/5ML syrup   exposure to confirmed family members with flu in the past 2-3 d, sx  started yesterday, start tamiflu, rest, push fluids, VSS    3. Right ear impacted cerumen  H61.21    encouraged her to use debrox and irrigation, TM not visible        Danelle Berry, PA-C 03/06/23 9:31 AM

## 2023-03-06 NOTE — Telephone Encounter (Signed)
Patient has appt with 03-06-23 @ 9:20

## 2023-03-06 NOTE — Telephone Encounter (Signed)
  Chief Complaint: flu symptoms-exposure Symptoms: sinus, ear pain, fever, headache, body ache Frequency: Symptoms started last night  Disposition: [] ED /[] Urgent Care (no appt availability in office) / [x] Appointment(In office/virtual)/ []  [facility]/ [] Home Care/ [] Refused Recommended Disposition /[]  Mobile Bus/ []  Follow-up with PCP Additional Notes: No open appointment- appointment scheduled with [facility] at [facility]

## 2023-03-06 NOTE — Telephone Encounter (Signed)
Summary: cough   Patient was exposed to the flu low grade fever, cough, congestion, patient PCP has no available appointments today, seeking clinical advice         Reason for Disposition  Earache  Answer Assessment - Initial Assessment Questions 1. TYPE of EXPOSURE: "How were you exposed?" (e.g., close contact, not a close contact)     Grandchild- lives with patient- diagnosed yesterday 2. DATE of EXPOSURE: "When did the exposure occur?" (e.g., hour, days, weeks)     03/05/23  4. HIGH RISK for COMPLICATIONS: "Do you have any heart or lung problems?" "Do you have a weakened immune system?" (e.g., CHF, COPD, asthma, HIV positive, chemotherapy, renal failure, diabetes mellitus, sickle cell anemia)     no 5. SYMPTOMS: "Do you have any symptoms?" (e.g., cough, fever, sore throat, difficulty breathing).     Last night- worse , sinus, ear pain, small headache, low grade fever, body ache  Protocols used: Influenza (Flu) Exposure-A-AH, Influenza (Flu) - Seasonal-A-AH

## 2023-03-15 NOTE — Progress Notes (Unsigned)
Cardiology Clinic Note   Date: 03/17/2023 ID: [name], DOB 04-10-1955, MRN [id number]  Primary Cardiologist:  Lorine Bears, MD  Chief Complaint   Jasmine Webb is a 68 y.o. female who presents to the clinic today for routine follow up.   Patient Profile   Jasmine Webb is followed by Dr. Kirke Corin for the history outlined below.      Past medical history significant for: Palpitations/SVT. 7-day ZIO 07/11/2020: HR 59 to 179 bpm, average 89 bpm.  Predominantly sinus rhythm.  1 run of VT lasting 4 beats with max rate 190 bpm.  2531 runs of SVT fastest 4 beats max rate 179 bpm, longest 10 minutes 33 seconds average rate 120 bpm.  Frequent PACs.  Rare PVCs. Dyspnea. Echo 08/06/2020: EF 60 to 65%.  No RWMA.  Grade I DD.  Normal RV size/function.  Normal PA pressure.  Mild to moderate LAE.  Mild MR, moderate MAC.  Moderate TR. Nuclear stress test 08/25/2020: Low risk study.  No significant ischemia or scar.  No significant coronary artery calcification.  MAC is noted. Hypertension. Hyperlipidemia. Lipid panel 09/26/2022: LDL 48, HDL 47, TG 113, total 160. Lymphedema. PE. CTA chest PE protocol 10/10/2019: Multiple bilateral PE with evidence of right heart strain consistent with at least submassive PE.  Nodular airspace infiltrates in the lung bases likely indicate COVID-pneumonia. Thrombolysis with tPA bilateral pulmonary arteries.  Mechanical thrombectomy right middle, upper, lower lobe pulmonary arteries as well as left main, upper and lower lobe pulmonary arteries 10/10/2019.  In summary, patient has a history of submassive PE in the setting of COVID-pneumonia September 2021.  Venous ultrasound was negative for DVT.  She underwent thrombolysis and thrombectomy as detailed above.  Echo at that time showed EF 55 to 60%, no RWMA, Grade II DD, normal RV size/function, trivial MR, moderate MAC.  She was first evaluated by Dr. Kirke Corin on 06/26/2020 for irregular heartbeat at the request of [name],  NP.  She reported palpitations with mild dizziness as well as exertional dyspnea and lower extremity edema.  7 days ZIO showed SVT as detailed above.  Echo showed normal LV/RV function as detailed above.  She had a low risk nuclear stress test in July 2022.  Patient was last seen in the office by Cadence Furth, PA-C on 03/04/2022 for routine follow-up.  She was doing well at that time and no medication changes were made.     History of Present Illness    Today, patient is here alone. Patient denies shortness of breath, dyspnea on exertion, orthopnea or PND. No chest pain, pressure, or tightness. She reports a near life long history of occasional palpitations that she does not really notice, as she is so used to them. She has chronic lower extremity edema managed with compression stockings and Lasix. Lower extremity edema is best in the morning and progresses throughout the day. She does not notice much of a difference if she elevates her legs during the day. She restricts her dietary sodium. She stays active with her 2 young grandchildren whom she lives with and participating in other church activities outside of her home. She recently had the flu but has fully recovered. She has an upcoming visit with PCP in 2 weeks and will have labs done at that time.    ROS: All other systems reviewed and are otherwise negative except as noted in History of Present Illness.  EKGs/Labs Reviewed    EKG Interpretation Date/Time:  Friday March 17 2023 08:14:55 EST  Ventricular Rate:  59 PR Interval:  180 QRS Duration:  88 QT Interval:  408 QTC Calculation: 403 R Axis:   44  Text Interpretation: Sinus bradycardia When compared with ECG of 31-Aug-2021 09:49, No significant change was found Confirmed by Carlos Levering [phone]) on 03/17/2023 8:19:33 AM   09/26/2022: ALT 9; AST 16 10/12/2022: BUN 15; Creatinine, Ser 0.68; Potassium 4.0; Sodium 144   09/26/2022: Hemoglobin 12.4; WBC 6.8   09/26/2022: TSH 3.270     Physical Exam    VS:  BP 127/77   Pulse (!) 59   Ht 5\' 3"  (1.6 m)   Wt (!) 308 lb 6.4 oz (139.9 kg)   LMP  (LMP Unknown)   SpO2 97%   BMI 54.63 kg/m  , BMI Body mass index is 54.63 kg/m.  GEN: Well nourished, well developed, in no acute distress. Neck: No JVD or carotid bruits. Cardiac:  RRR. No murmurs. No rubs or gallops.   Respiratory:  Respirations regular and unlabored. Clear to auscultation without rales, wheezing or rhonchi. GI: Soft, nontender, nondistended. Extremities: Radials/DP/PT 2+ and equal bilaterally. No clubbing or cyanosis. Mild to moderate nonpitting edema bilateral lower extremities.   Skin: Warm and dry, no rash. Neuro: Strength intact.  Assessment & Plan   Palpitations/SVT 7-day ZIO June 2022 showed HR 59 to 179 bpm, average 89 bpm, predominantly sinus rhythm, 2531 runs of SVT fastest 4 beats max rate 179 bpm, longest 10 minutes 33 seconds average rate 120 bpm, frequent PACs, rare PVCs.  Patient reports a near life long history of palpitations that she does not find bothersome. EKG demonstrates sinus bradycardia.  -Continue metoprolol.  Hypertension BP today 127/77. No report of headaches or dizziness.  -Continue metoprolol, losartan.  Lower extremity edema Patient reports chronic, stable lower extremity edema that she manages with compression stocking and Lasix. She does not notice any reduction in the edema with elevation during the day. She denies shortness of breath or DOE. She shares that her mother and sister also had chronic edema. She has mild to moderate nonpitting edema bilateral lower extremities.  -Continue Lasix.  Hyperlipidemia LDL August 2024 48, at goal. -Continue atorvastatin. -Followed by PCP.  History of PE S/p thrombolysis mechanical thrombectomy of submassive PE in the setting of COVID-pneumonia September 2021.  Patient denies shortness of breath or DOE. Denies spontaneous bleeding concerns. Lungs clear to auscultation.   -Continue Eliquis. -Followed by PCP.  Disposition: Return in 1 year or sooner as needed.          Signed, Etta Grandchild. Savannaha Stonerock, DNP, NP-C

## 2023-03-17 ENCOUNTER — Encounter: Payer: Self-pay | Admitting: Student

## 2023-03-17 ENCOUNTER — Ambulatory Visit: Payer: 59 | Attending: Student | Admitting: Student

## 2023-03-17 ENCOUNTER — Telehealth: Payer: Self-pay | Admitting: Nurse Practitioner

## 2023-03-17 VITALS — BP 127/77 | HR 59 | Ht 63.0 in | Wt 308.4 lb

## 2023-03-17 DIAGNOSIS — E785 Hyperlipidemia, unspecified: Secondary | ICD-10-CM

## 2023-03-17 DIAGNOSIS — R600 Localized edema: Secondary | ICD-10-CM | POA: Diagnosis not present

## 2023-03-17 DIAGNOSIS — I471 Supraventricular tachycardia, unspecified: Secondary | ICD-10-CM | POA: Diagnosis not present

## 2023-03-17 DIAGNOSIS — Z86711 Personal history of pulmonary embolism: Secondary | ICD-10-CM

## 2023-03-17 DIAGNOSIS — I10 Essential (primary) hypertension: Secondary | ICD-10-CM | POA: Diagnosis not present

## 2023-03-17 DIAGNOSIS — R002 Palpitations: Secondary | ICD-10-CM

## 2023-03-17 MED ORDER — ATORVASTATIN CALCIUM 40 MG PO TABS: 40.0000 mg | ORAL_TABLET | Freq: Every day | ORAL | 4 refills | Status: AC

## 2023-03-17 NOTE — Telephone Encounter (Signed)
Medication Refill -  Most Recent Primary Care Visit:  Provider: [name] T  Department: [facility]  Visit Type: OFFICE VISIT  Date: 01/27/2023  Medication:allegra 5 mg twice daily  Has the patient contacted their pharmacy? No   Is this the correct pharmacy for this prescription? Yes  This is the patient's preferred pharmacy:  [id number] - [address] [place] [address] Phone: [phone] Fax: [phone]   Has the prescription been filled recently? No  Is the patient out of the medication? Yes  Has the patient been seen for an appointment in the last year OR does the patient have an upcoming appointment? Yes  Can we respond through MyChart? Yes  Agent: Please be advised that Rx refills may take up to 3 business days. We ask that you follow-up with your pharmacy.

## 2023-03-17 NOTE — Telephone Encounter (Signed)
Called and LVM asking for patient to please return my call to discus medication dosage.   OK for PEC to speak to patient and find out correct dosage if she calls back.

## 2023-03-17 NOTE — Telephone Encounter (Signed)
allegra 5 mg not on current list, routing for approval.

## 2023-03-17 NOTE — Patient Instructions (Signed)
Medication Instructions:  None  *If you need a refill on your cardiac medications before your next appointment, please call your pharmacy*   Lab Work: None  If you have labs (blood work) drawn today and your tests are completely normal, you will receive your results only by: MyChart Message (if you have MyChart) OR A paper copy in the mail If you have any lab test that is abnormal or we need to change your treatment, we will call you to review the results.   Testing/Procedures: None   Follow-Up: At [facility], you and your health needs are our priority.  As part of our continuing mission to provide you with exceptional heart care, we have created designated Provider Care Teams.  These Care Teams include your primary Cardiologist (physician) and Advanced Practice Providers (APPs -  Physician Assistants and Nurse Practitioners) who all work together to provide you with the care you need, when you need it.  We recommend signing up for the patient portal called "MyChart".  Sign up information is provided on this After Visit Summary.  MyChart is used to connect with patients for Virtual Visits (Telemedicine).  Patients are able to view lab/test results, encounter notes, upcoming appointments, etc.  Non-urgent messages can be sent to your provider as well.   To learn more about what you can do with MyChart, go to [email or web address].    Your next appointment:   1 year(s)  Provider:   Lorine Bears, MD or Carlos Levering, NP

## 2023-03-20 ENCOUNTER — Other Ambulatory Visit: Payer: Self-pay | Admitting: Nurse Practitioner

## 2023-03-20 NOTE — Telephone Encounter (Signed)
 Medication Refill -  Most Recent Primary Care Visit:  Provider: [name] T  Department: [facility]  Visit Type: OFFICE VISIT  Date: 01/27/2023  Medication: apixaban (ELIQUIS) 5 MG TABS tablet [[id number]]   Has the patient contacted their pharmacy? Yes  (Agent: If yes, when and what did the pharmacy advise?) Call office, pharmacy sent request as well   Is this the correct pharmacy for this prescription? Yes  This is the patient's preferred pharmacy:  [id number] - [address] [place] [address] Phone: [phone] Fax: [phone]   Has the prescription been filled recently? Yes  Is the patient out of the medication? Yes  Has the patient been seen for an appointment in the last year OR does the patient have an upcoming appointment? Yes  Can we respond through MyChart? No  Agent: Please be advised that Rx refills may take up to 3 business days. We ask that you follow-up with your pharmacy.

## 2023-03-21 MED ORDER — APIXABAN 5 MG PO TABS: 5.0000 mg | ORAL_TABLET | Freq: Two times a day (BID) | ORAL | 1 refills | Status: DC

## 2023-03-21 NOTE — Telephone Encounter (Signed)
 Requested Prescriptions  Pending Prescriptions Disp Refills   apixaban (ELIQUIS) 5 MG TABS tablet 180 tablet 1    Sig: Take 1 tablet (5 mg total) by mouth 2 (two) times daily.     Hematology:  Anticoagulants - apixaban Passed - 03/21/2023 11:52 AM      Passed - PLT in normal range and within 360 days    Platelets  Date Value Ref Range Status  09/26/2022 167 150 - 450 x10E3/uL Final         Passed - HGB in normal range and within 360 days    Hemoglobin  Date Value Ref Range Status  09/26/2022 12.4 11.1 - 15.9 g/dL Final         Passed - HCT in normal range and within 360 days    Hematocrit  Date Value Ref Range Status  09/26/2022 39.3 34.0 - 46.6 % Final         Passed - Cr in normal range and within 360 days    Creatinine, Ser  Date Value Ref Range Status  10/12/2022 0.68 0.57 - 1.00 mg/dL Final         Passed - AST in normal range and within 360 days    AST  Date Value Ref Range Status  09/26/2022 16 0 - 40 IU/L Final         Passed - ALT in normal range and within 360 days    ALT  Date Value Ref Range Status  09/26/2022 9 0 - 32 IU/L Final         Passed - Valid encounter within last 12 months    Recent Outpatient Visits           1 month ago Subacute cough   [place] [facility] [place], [place] T, NP   2 months ago Subacute cough   [place] [facility] [place], [place] T, NP   5 months ago Morbid obesity due to excess calories (HCC)   [facility] [place], Corrie Dandy T, NP   9 months ago Ingrown right greater toenail   [place] [facility] [place], Megan P, DO   9 months ago Ingrown right greater toenail   [place] [facility] [place], Dorie Rank, NP       Future Appointments             In 1 week [place], Dorie Rank, NP [place] [facility]

## 2023-03-26 NOTE — Patient Instructions (Signed)
 Be Involved in Caring For Your Health:  Taking Medications When medications are taken as directed, they can greatly improve your health. But if they are not taken as prescribed, they may not work. In some cases, not taking them correctly can be harmful. To help ensure your treatment remains effective and safe, understand your medications and how to take them. Bring your medications to each visit for review by your provider.  Your lab results, notes, and after visit summary will be available on My Chart. We strongly encourage you to use this feature. If lab results are abnormal the clinic will contact you with the appropriate steps. If the clinic does not contact you assume the results are satisfactory. You can always view your results on My Chart. If you have questions regarding your health or results, please contact the clinic during office hours. You can also ask questions on My Chart.  We at [facility] are grateful that you chose [place] to provide your care. We strive to provide evidence-based and compassionate care and are always looking for feedback. If you get a survey from the clinic please complete this so we can hear your opinions.  Prediabetes: Eating Plan Prediabetes is when your levels of blood sugar, also called glucose, are higher than normal. This can put you at risk for getting type 2 diabetes. When you have prediabetes, making healthy changes can help keep you from getting diabetes. This includes changes in your diet. Work with your health care provider or an expert in healthy eating called a dietitian. They can help you create a healthy eating plan. This plan can help you: Control your blood sugar levels. Improve your cholesterol levels. Manage your blood pressure. What are tips for following this plan? Reading food labels Read food labels to check the amount of fat and sugar in prepackaged foods. Avoid foods that have: Saturated fats. Trans fats. Added sugars. Check  food labels for the amount of salt (sodium). Avoid foods that have more than 300 milligrams (mg) of salt per serving. Limit your salt intake to less than 2,300 mg each day. Shopping Avoid buying pre-made and processed foods. Avoid buying drinks with added sugar. Cooking Cook with olive oil. Do not use: Butter. Lard. Ghee. Bake, broil, grill, steam, or boil foods. Avoid frying. Meal planning  Work with your dietitian to create an eating plan that's right for you. This may include tracking how many calories you take in each day. Use a food diary, notebook, or mobile app to track what you eat at each meal. Consider following a Mediterranean diet. This includes: Eating many servings of fresh fruits and vegetables each day. Eating fish at least twice a week. Eating one serving each day of whole grains, beans, nuts, and seeds. Using olive oil instead of other fats. Limiting alcohol. Limiting red meat. Using nonfat or low-fat dairy products. Consider following a plant-based diet. This means eating mostly: Vegetables and fruit. Grains. Beans. Nuts and seeds. If you have high blood pressure, you may need to limit your salt intake or follow a diet called the DASH eating plan. The DASH eating plan can help lower high blood pressure. Lifestyle Set weight loss goals with help from your health care team. Losing 7% of your body weight is a good goal for most people with prediabetes. Exercise for at least 30 minutes, 5 or more days a week. For support, think about joining a support group or talking with a mental health counselor. Take medicines only as  told. What foods are recommended? Fruits Berries. Bananas. Apples. Oranges. Grapes. Papaya. Mango. Pomegranate. Kiwi. Grapefruit. Cherries. Vegetables Lettuce. Spinach. Peas. Beets. Cauliflower. Cabbage. Broccoli. Carrots. Tomatoes. Squash. Eggplant. Herbs. Peppers. Onions. Cucumbers. Brussels sprouts. Grains Whole grains, such as whole-wheat  or whole-grain breads or pasta. Unsweetened oatmeal. Bulgur. Barley. Quinoa. Brown rice. Corn or whole-wheat flour tortillas or taco shells. Meats and other proteins Seafood. Poultry without skin. Lean cuts of pork and beef. Tofu. Eggs. Nuts. Beans. Dairy Low-fat or fat-free dairy products, such as yogurt, cottage cheese, and cheese. Beverages Water. Tea. Coffee. Sugar-free or diet soda. Seltzer water. Low-fat or nonfat milk. Milk alternatives, such as soy or almond milk. Fats and oils Olive oil. Canola oil. Sunflower oil. Grapeseed oil. Avocado. Walnuts. Sweets and desserts Sugar-free or low-fat pudding. Sugar-free or low-fat ice cream and other frozen treats. Seasonings and condiments Herbs. Salt-free spices. Mustard. Relish. Low-salt, low-sugar ketchup. Low-salt, low-sugar barbecue sauce. Low-fat or fat-free mayonnaise. The items listed above may not be all the foods and drinks you can have. Talk with a dietitian to learn more. What foods are not recommended? Fruits Fruits canned with syrup. Vegetables Canned vegetables. Frozen vegetables with butter or cream sauce. Grains Refined white flour and flour products, such as bread, pasta, snack foods, and cereals. Meats and other proteins Fatty cuts of meat. Poultry with skin. Breaded or fried meat. Processed meats. Dairy Full-fat yogurt, cheese, or milk. Beverages Sweetened drinks, such as iced tea and soda. Fats and oils Butter. Lard. Ghee. Sweets and desserts Baked goods, such as cake, cupcakes, pastries, cookies, and cheesecake. Seasonings and condiments Spice mixes with added salt. Ketchup. Barbecue sauce. Mayonnaise. The items listed above may not be all the foods and drinks you should avoid. Talk with a dietitian to learn more. Where to find more information American Diabetes Association: diabetes.org/food-nutrition This information is not intended to replace advice given to you by your health care provider. Make sure you  discuss any questions you have with your health care provider. Document Revised: 08/21/2022 Document Reviewed: 08/21/2022 Elsevier Patient Education  2024 [employer].

## 2023-03-27 IMAGING — CT CT HEAD W/O CM
3 series · 15 of 47 positions shown, 18 images · non-contrast
Comparison: None.

CLINICAL DATA: Dizziness

EXAM:
CT HEAD WITHOUT CONTRAST
TECHNIQUE: Contiguous axial images were obtained from the base of the skull
through the vertex without intravenous contrast.

[Series 2: head wo · axial · 0.45mm/px · z∈[-155,-25]mm · 9 of 32 slices shown, 12 images]
[im 3/32  brain]
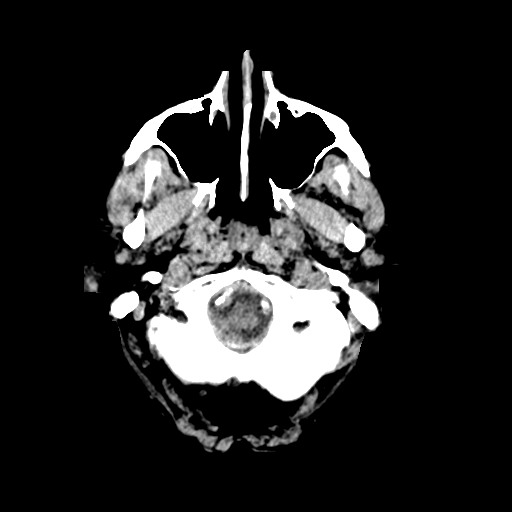
[im 3/32  bone]
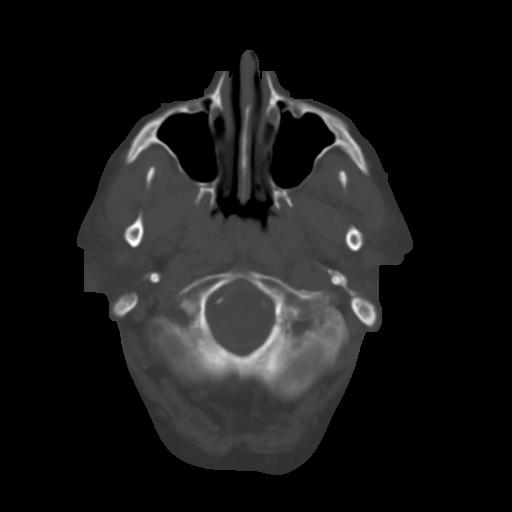
[im 6/32  brain]
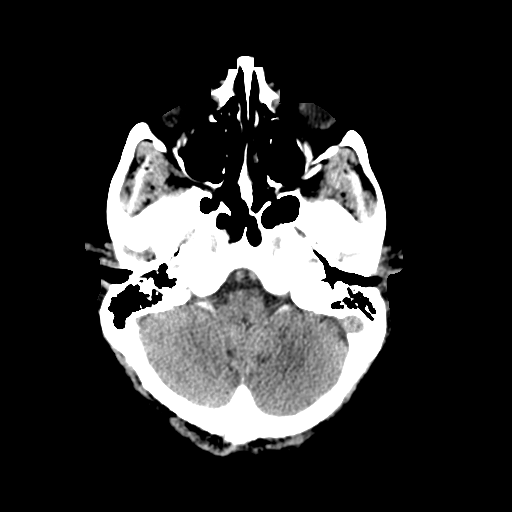
[im 9/32  brain]
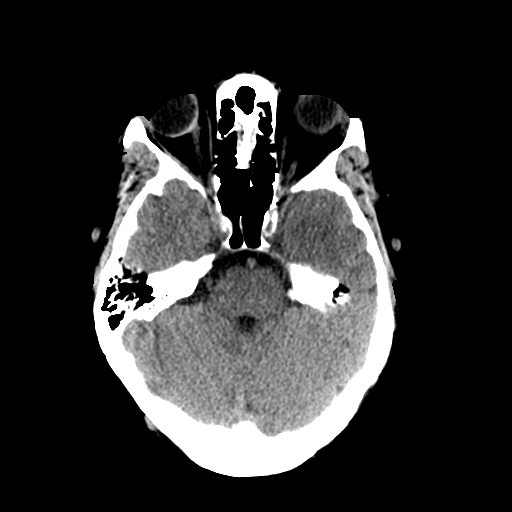
[im 12/32  brain]
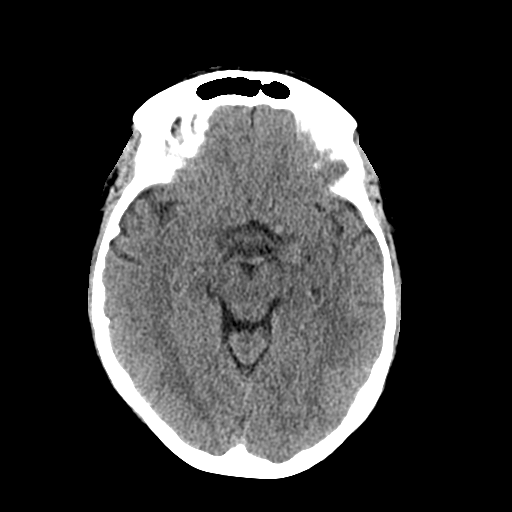
[im 17/32  brain]
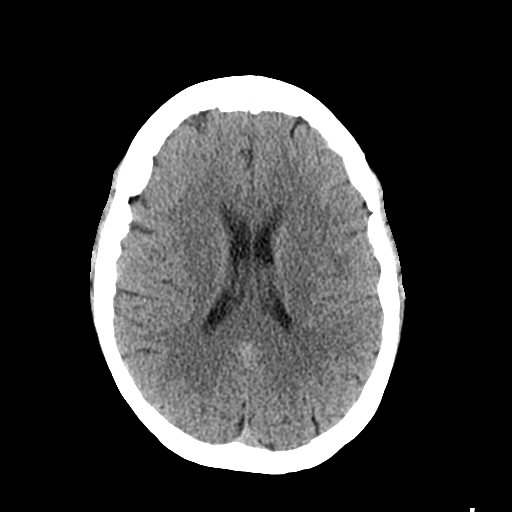
[im 17/32  bone]
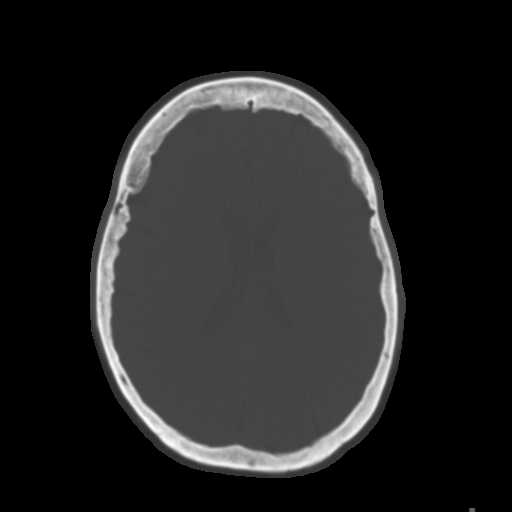
[im 20/32  brain]
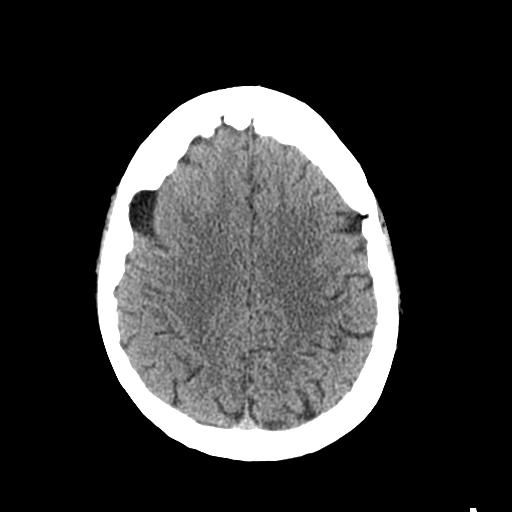
[im 23/32  brain]
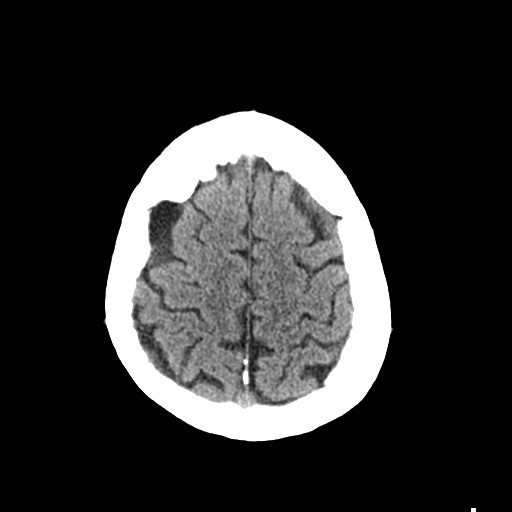
[im 26/32  brain]
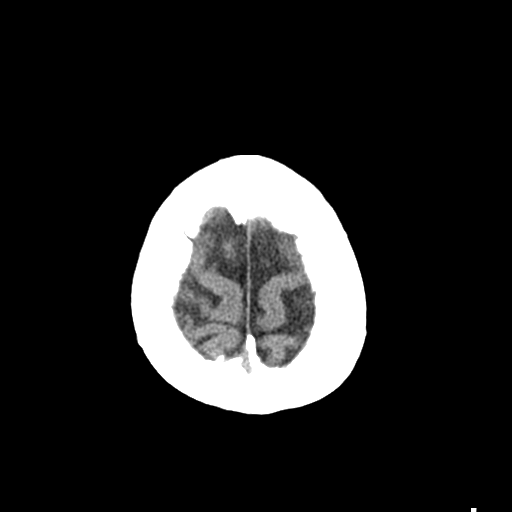
[im 29/32  brain]
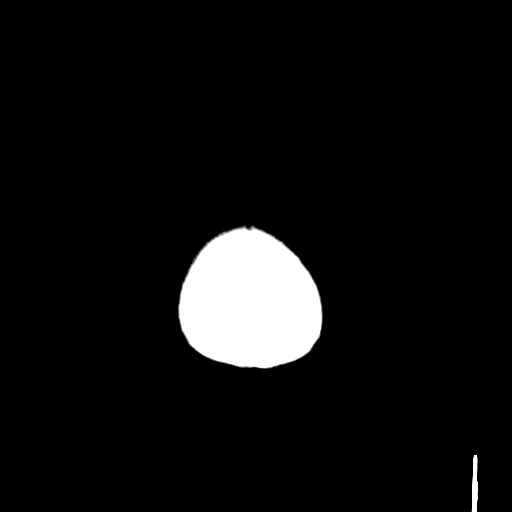
[im 29/32  bone]
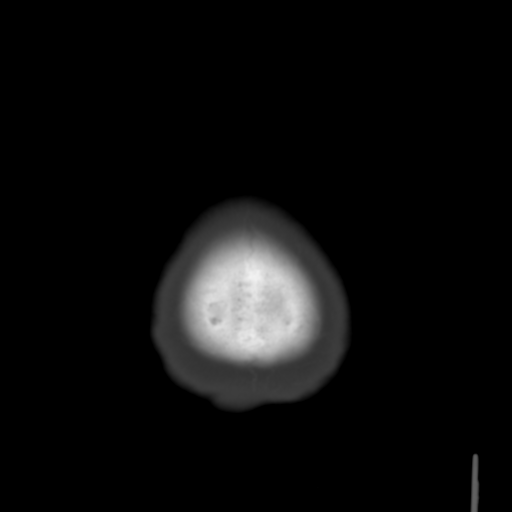

[Series 4: coronal soft tissue · coronal · 0.31mm/px · 3 of 65 slices shown]
[im 22/65  brain]
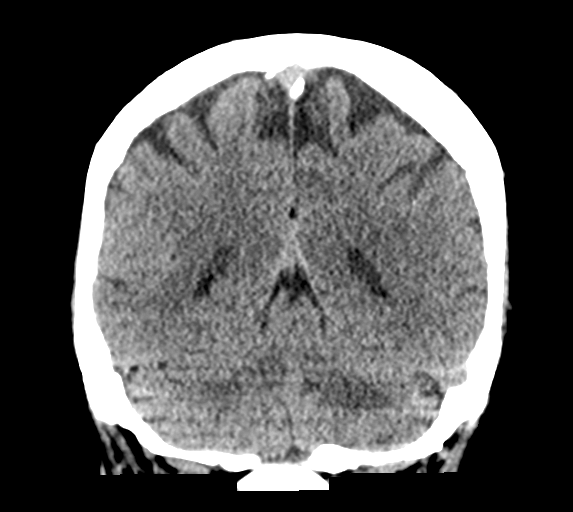
[im 29/65  brain]
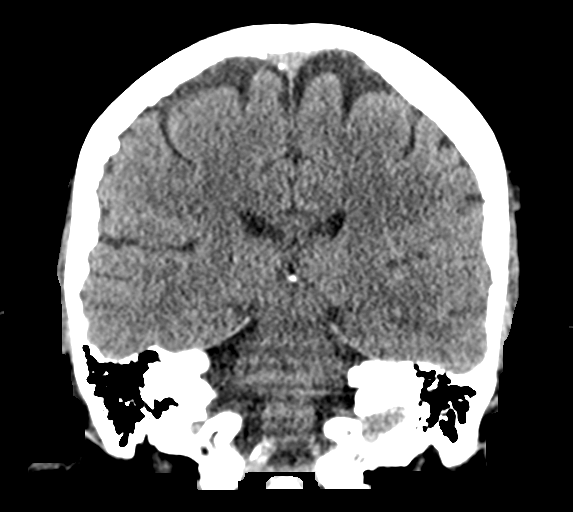
[im 36/65  brain]
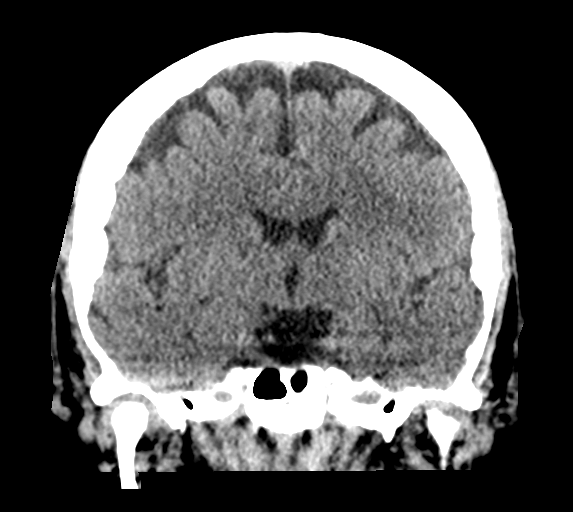

[Series 5: sagittal soft tissue · sagittal · 0.30mm/px · 3 of 54 slices shown]
[im 18/54  brain]
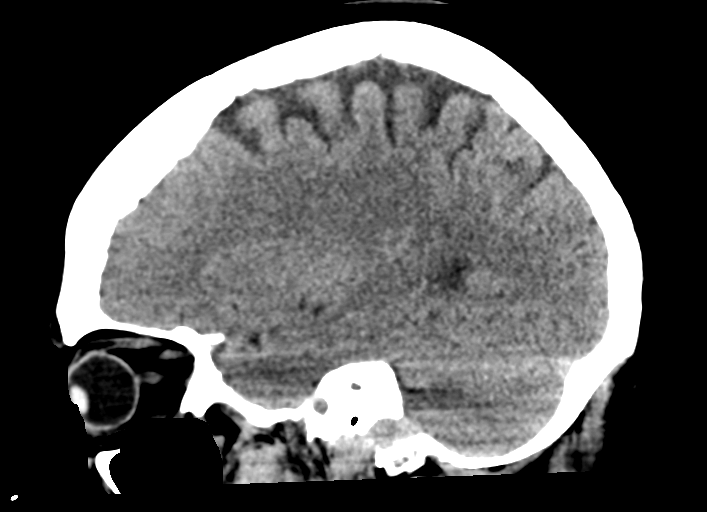
[im 27/54  brain]
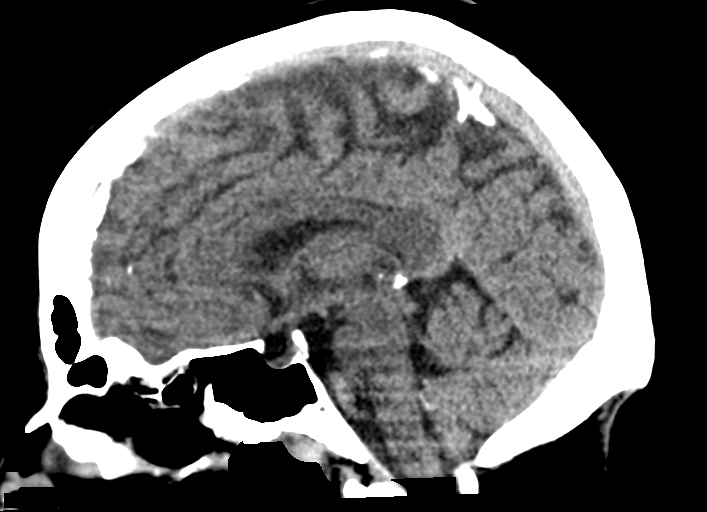
[im 36/54  brain]
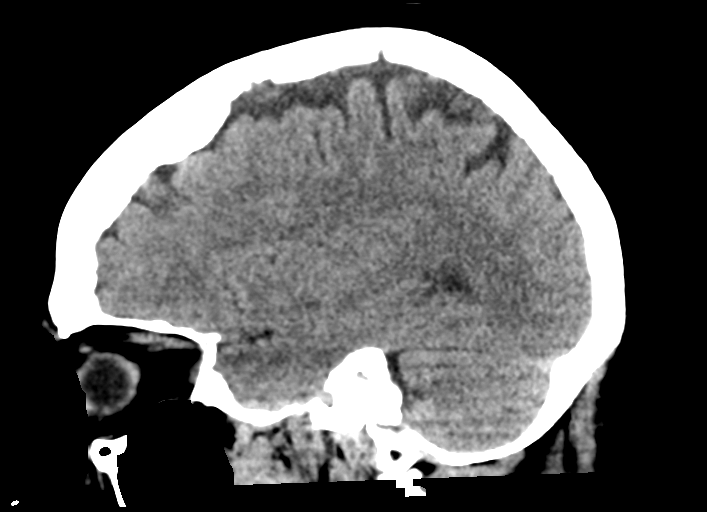

[15 of 47 positions shown; findings below may reference images not displayed]

FINDINGS: Brain: No evidence of acute infarction, hemorrhage, hydrocephalus,
extra-axial collection or mass lesion/mass effect.

Benign cystic lesion beneath the right frontal bone (coronal image
24) reflects an arachnoid cyst.

Vascular: No hyperdense vessel or unexpected calcification.

Skull: Normal. Negative for fracture or focal lesion.

Sinuses/Orbits: The visualized paranasal sinuses are essentially
clear. The mastoid air cells are unopacified.

Other: None.
IMPRESSION: Benign arachnoid cyst.

No evidence of acute intracranial abnormality.

## 2023-03-29 ENCOUNTER — Encounter: Payer: Self-pay | Admitting: Nurse Practitioner

## 2023-03-29 ENCOUNTER — Ambulatory Visit: Payer: 59 | Admitting: Nurse Practitioner

## 2023-03-29 VITALS — BP 134/81 | HR 75 | Temp 98.4°F | Ht 63.0 in | Wt 304.2 lb

## 2023-03-29 DIAGNOSIS — R7309 Other abnormal glucose: Secondary | ICD-10-CM | POA: Diagnosis not present

## 2023-03-29 DIAGNOSIS — I471 Supraventricular tachycardia, unspecified: Secondary | ICD-10-CM

## 2023-03-29 DIAGNOSIS — I890 Lymphedema, not elsewhere classified: Secondary | ICD-10-CM

## 2023-03-29 DIAGNOSIS — I10 Essential (primary) hypertension: Secondary | ICD-10-CM

## 2023-03-29 DIAGNOSIS — G9331 Postviral fatigue syndrome: Secondary | ICD-10-CM | POA: Diagnosis not present

## 2023-03-29 DIAGNOSIS — Z6841 Body Mass Index (BMI) 40.0 and over, adult: Secondary | ICD-10-CM | POA: Diagnosis not present

## 2023-03-29 DIAGNOSIS — D6869 Other thrombophilia: Secondary | ICD-10-CM

## 2023-03-29 DIAGNOSIS — E782 Mixed hyperlipidemia: Secondary | ICD-10-CM | POA: Diagnosis not present

## 2023-03-29 DIAGNOSIS — Z86711 Personal history of pulmonary embolism: Secondary | ICD-10-CM

## 2023-03-29 LAB — MICROALBUMIN, URINE WAIVED
Creatinine, Urine Waived: 50 mg/dL (ref 10–300)
Microalb, Ur Waived: 30 mg/L — ABNORMAL HIGH (ref 0–19)

## 2023-03-29 LAB — BAYER DCA HB A1C WAIVED: HB A1C (BAYER DCA - WAIVED): 5.6 % (ref 4.8–5.6)

## 2023-03-29 NOTE — Assessment & Plan Note (Signed)
 Chronic, stable. Followed by cardiology. Continue current medication regimen as ordered by cardiology. Follow up in 6 months.

## 2023-03-29 NOTE — Assessment & Plan Note (Signed)
 Chronic, stable. BP remains at goal in office today. Recommend to continue daily BP monitoring at home. Continue current medication regimen. Continue collaboration with cardiology. Continue DASH diet at home. Follow up in 6 months.

## 2023-03-29 NOTE — Assessment & Plan Note (Signed)
 BMI 53.89.  Recommended eating smaller high protein, low fat meals more frequently and exercising 30 mins a day 5 times a week with a goal of 10-15lb weight loss in the next 3 months. Patient voiced their understanding and motivation to adhere to these recommendations.

## 2023-03-29 NOTE — Assessment & Plan Note (Signed)
 Chronic, stable. Continues to be consistent with wearing compression stockings when at home and out in public. Continue collaboration with vascular. Follow up in 6 months.

## 2023-03-29 NOTE — Assessment & Plan Note (Signed)
 Acute since illnesses x 3.  Discussed recovery time with aging with patient.  Recommend good hydration + plenty of rest.  Start Vitamin C and Zinc.  Check labs today.

## 2023-03-29 NOTE — Progress Notes (Signed)
 BP 134/81   Pulse 75   Temp 98.4 F (36.9 C) (Oral)   Ht 5\' 3"  (1.6 m)   Wt (!) 304 lb 3.2 oz (138 kg)   LMP  (LMP Unknown)   SpO2 94%   BMI 53.89 kg/m    Subjective:    Patient ID: Jasmine Webb, female    DOB: 05-07-1955, 68 y.o.   MRN: [id number]  HPI: Jasmine Webb is a 68 y.o. female  Chief Complaint  Patient presents with   Hyperlipidemia   Hypertension   Obesity   NOTE WRITTEN BY DNP STUDENT.  ASSESSMENT AND PLAN OF CARE REVIEWED WITH STUDENT, AGREE WITH ABOVE FINDINGS AND PLAN.  HYPERTENSION / HYPERLIPIDEMIA Taking Losartan, Atorvastatin, Lasix, and Eliquis.  Has history of PE during Covid infection and to maintain Eliquis on board.  Last saw cardiology for PSVT on 03/17/23.  Has lymphedema, continues to wear compression wraps.  Last saw vascular 03/25/21. Satisfied with current treatment? yes Duration of hypertension: years BP monitoring frequency: daily BP range: 130/76 BP medication side effects: no Duration of hyperlipidemia: years Cholesterol medication side effects: no Cholesterol supplements: none Medication compliance: good compliance Aspirin: no Recent stressors:  influenza x2, stomach virus Recurrent headaches: yes Visual changes: no Palpitations: no Dyspnea: no Chest pain: no Lower extremity edema: yes Dizzy/lightheaded: no   FATIGUE Duration:  weeks Severity: severe  Onset: gradual Context when symptoms started:   Influenza x2, stomach virus Symptoms improve with rest: no  Depressive symptoms: no Stress/anxiety: no Insomnia: no    Snoring: yes Observed apnea by bed partner: no Daytime hypersomnolence:yes Wakes feeling refreshed: no History of sleep study: no Dysnea on exertion:  no Orthopnea/PND: no Chest pain: no Chronic cough: no Lower extremity edema: no Arthralgias:no Myalgias: no Weakness: no Rash: no   Impaired Fasting Glucose HbA1C:  Lab Results  Component Value Date   HGBA1C 5.6 03/29/2023  Duration of elevated  blood sugar:  Polydipsia: no Polyuria: no Weight change: no Visual disturbance: no Glucose Monitoring: no    Accucheck frequency: Not Checking    Fasting glucose:     Post prandial:  Diabetic Education: Completed Family history of diabetes: yes   Relevant past medical, surgical, family and social history reviewed and updated as indicated. Interim medical history since our last visit reviewed. Allergies and medications reviewed and updated.  Review of Systems  Constitutional:  Positive for activity change and fatigue. Negative for appetite change, chills and fever.  HENT:  Negative for congestion, ear pain, sinus pressure, sinus pain and sore throat.   Respiratory:  Negative for cough and shortness of breath.   Cardiovascular:  Negative for chest pain.  Gastrointestinal:  Negative for diarrhea and nausea.  Musculoskeletal:  Negative for neck pain and neck stiffness.  Skin:  Negative for rash and wound.  Neurological:  Negative for dizziness and headaches.  Psychiatric/Behavioral:  Negative for agitation, confusion and suicidal ideas.    Per HPI unless specifically indicated above     Objective:    BP 134/81   Pulse 75   Temp 98.4 F (36.9 C) (Oral)   Ht 5\' 3"  (1.6 m)   Wt (!) 304 lb 3.2 oz (138 kg)   LMP  (LMP Unknown)   SpO2 94%   BMI 53.89 kg/m   Wt Readings from Last 3 Encounters:  03/29/23 (!) 304 lb 3.2 oz (138 kg)  03/17/23 (!) 308 lb 6.4 oz (139.9 kg)  03/06/23 (!) 302 lb (137 kg)    Physical  Exam Vitals and nursing note reviewed.  Constitutional:      General: She is not in acute distress.    Appearance: Normal appearance. She is obese. She is not ill-appearing.  HENT:     Right Ear: Ear canal normal.     Left Ear: Ear canal normal.     Nose: Nose normal.     Mouth/Throat:     Mouth: Mucous membranes are moist.     Pharynx: Oropharynx is clear. No oropharyngeal exudate or posterior oropharyngeal erythema.  Neck:     Thyroid: No thyroid mass.      Vascular: No carotid bruit.     Trachea: Trachea normal.  Cardiovascular:     Rate and Rhythm: Normal rate and regular rhythm.     Heart sounds: Normal heart sounds. No murmur heard. Pulmonary:     Effort: Pulmonary effort is normal. No respiratory distress.     Breath sounds: Normal breath sounds. No wheezing.  Abdominal:     General: Bowel sounds are normal. There is no distension.     Palpations: Abdomen is soft.     Tenderness: There is no abdominal tenderness.  Musculoskeletal:        General: No tenderness. Normal range of motion.     Cervical back: Full passive range of motion without pain, normal range of motion and neck supple. No tenderness.  Lymphadenopathy:     Cervical:     Right cervical: No superficial cervical adenopathy.    Left cervical: No superficial cervical adenopathy.  Skin:    General: Skin is warm and dry.     Findings: No bruising or rash.  Neurological:     General: No focal deficit present.     Mental Status: She is alert and oriented to person, place, and time.     Deep Tendon Reflexes: Reflexes normal.  Psychiatric:        Attention and Perception: Attention and perception normal.        Mood and Affect: Mood and affect normal.        Speech: Speech normal.        Behavior: Behavior normal. Behavior is cooperative.        Thought Content: Thought content normal.        Cognition and Memory: Cognition and memory normal.        Judgment: Judgment normal.    Results for orders placed or performed in visit on 03/29/23  Bayer DCA Hb A1c Waived   Collection Time: 03/29/23  8:09 AM  Result Value Ref Range   HB A1C (BAYER DCA - WAIVED) 5.6 4.8 - 5.6 %  Microalbumin, Urine Waived   Collection Time: 03/29/23  8:09 AM  Result Value Ref Range   Microalb, Ur Waived 30 (H) 0 - 19 mg/L   Creatinine, Urine Waived 50 10 - 300 mg/dL   Microalb/Creat Ratio 30-300 (H) <30 mg/g      Assessment & Plan:   Problem List Items Addressed This Visit        Cardiovascular and Mediastinum   Essential hypertension   Chronic, stable. BP remains at goal in office today. Recommend to continue daily BP monitoring at home. Continue current medication regimen. Continue collaboration with cardiology. Continue DASH diet at home. Follow up in 6 months.      Relevant Orders   Microalbumin, Urine Waived (Completed)   Comprehensive metabolic panel   SVT (supraventricular tachycardia) (HCC) - Primary   Chronic, stable. Followed by cardiology. Continue current  medication regimen as ordered by cardiology. Follow up in 6 months.        Other   BMI 50.0-59.9, adult (HCC)   Recommended eating smaller high protein, low fat meals more frequently and exercising 30 mins a day 5 times a week with a goal of 10-15lb weight loss in the next 3 months. Patient voiced their understanding and motivation to adhere to these recommendations.       Elevated hemoglobin A1c measurement   A1c today 5.6%. Trending down. Continue to focus on prediabetic meals.      Relevant Orders   Bayer DCA Hb A1c Waived (Completed)   Microalbumin, Urine Waived (Completed)   History of pulmonary embolus (PE)   Stable.  With Covid in September 2021.  At this time will continue Eliquis and collaboration with vascular + cardiology.  Refills up to date.  Cardiology recommends lifelong treatment.      Hyperlipidemia   Chronic, stable. Continue current medication regimen and adjust as needed.  Lipid panel. Follow up in 6 months.      Relevant Orders   Comprehensive metabolic panel   Lipid Panel w/o Chol/HDL Ratio   Lymphedema   Chronic, stable. Continues to be consistent with wearing compression stockings when at home and out in public. Continue collaboration with vascular. Follow up in 6 months.      Morbid obesity due to excess calories (HCC)   BMI 53.89.  Recommended eating smaller high protein, low fat meals more frequently and exercising 30 mins a day 5 times a week with a goal of  10-15lb weight loss in the next 3 months. Patient voiced their understanding and motivation to adhere to these recommendations.       Postviral fatigue syndrome   Acute since illnesses x 3.  Discussed recovery time with aging with patient.  Recommend good hydration + plenty of rest.  Start Vitamin C and Zinc.  Check labs today.      Secondary hypercoagulable state (HCC)   Continue taking Eliquis for history of PE. Continue to monitor closely and check CBC annually.        Follow up plan: Return in about 6 months (around 09/26/2023) for Annual Physical - after 09/26/23.

## 2023-03-29 NOTE — Assessment & Plan Note (Addendum)
 Recommended eating smaller high protein, low fat meals more frequently and exercising 30 mins a day 5 times a week with a goal of 10-15lb weight loss in the next 3 months. Patient voiced their understanding and motivation to adhere to these recommendations.

## 2023-03-29 NOTE — Assessment & Plan Note (Signed)
 Continue taking Eliquis for history of PE. Continue to monitor closely and check CBC annually.

## 2023-03-29 NOTE — Assessment & Plan Note (Signed)
Stable.  With Covid in September 2021.  At this time will continue Eliquis and collaboration with vascular + cardiology.  Refills up to date.  Cardiology recommends lifelong treatment.

## 2023-03-29 NOTE — Assessment & Plan Note (Addendum)
 Chronic, stable. Continue current medication regimen and adjust as needed.  Lipid panel. Follow up in 6 months.

## 2023-03-29 NOTE — Assessment & Plan Note (Signed)
 A1c today 5.6%. Trending down. Continue to focus on prediabetic meals.

## 2023-03-29 NOTE — Progress Notes (Deleted)
 BP 134/81   Pulse 75   Temp 98.4 F (36.9 C) (Oral)   Ht 5\' 3"  (1.6 m)   Wt (!) 304 lb 3.2 oz (138 kg)   LMP  (LMP Unknown)   SpO2 94%   BMI 53.89 kg/m    Subjective:    Patient ID: Jasmine Webb, female    DOB: 01/08/1956, 68 y.o.   MRN: [id number]  HPI: Jasmine Webb is a 68 y.o. female  Chief Complaint  Patient presents with   Hyperlipidemia   Hypertension   Obesity   HYPERTENSION / HYPERLIPIDEMIA Taking Losartan, Atorvastatin, Lasix, and Eliquis.  Has history of PE during Covid infection and to maintain Eliquis on board.  Last saw cardiology for PSVT on 03/17/23.  Has lymphedema, continues to wear compression wraps.  Last saw vascular 03/25/21. Satisfied with current treatment? {Blank single:19197::"yes","no"} Duration of hypertension: {Blank single:19197::"chronic","months","years"} BP monitoring frequency: {Blank single:19197::"not checking","rarely","daily","weekly","monthly","a few times a day","a few times a week","a few times a month"} BP range:  BP medication side effects: {Blank single:19197::"yes","no"} Duration of hyperlipidemia: {Blank single:19197::"chronic","months","years"} Cholesterol medication side effects: {Blank single:19197::"yes","no"} Cholesterol supplements: {Blank multiple:19196::"none","fish oil","niacin","red yeast rice"} Medication compliance: {Blank single:19197::"excellent compliance","good compliance","fair compliance","poor compliance"} Aspirin: {Blank single:19197::"yes","no"} Recent stressors: {Blank single:19197::"yes","no"} Recurrent headaches: {Blank single:19197::"yes","no"} Visual changes: {Blank single:19197::"yes","no"} Palpitations: {Blank single:19197::"yes","no"} Dyspnea: {Blank single:19197::"yes","no"} Chest pain: {Blank single:19197::"yes","no"} Lower extremity edema: {Blank single:19197::"yes","no"} Dizzy/lightheaded: {Blank single:19197::"yes","no"}   FATIGUE Duration:  {Blank  single:19197::"chronic","days","weeks","months"} Severity: {Blank single:19197::"mild","moderate","severe","1/10","2/10","3/10","4/10","5/10","6/10","7/10","8/10","9/10","10/10"}  Onset: {Blank single:19197::"sudden","gradual"} Context when symptoms started:  {Blank single:19197::"none","unknown"} Symptoms improve with rest: {Blank single:19197::"yes","no"}  Depressive symptoms: {Blank single:19197::"yes","no"} Stress/anxiety: {Blank single:19197::"yes","no"} Insomnia: {Blank single:19197::"yes","no"} {Blank single:19197::"hard to fall asleep","hard to stay asleep"} Snoring: {Blank single:19197::"yes","no"} Observed apnea by bed partner: {Blank single:19197::"yes","no"} Daytime hypersomnolence:{Blank single:19197::"yes","no"} Wakes feeling refreshed: {Blank single:19197::"yes","no"} History of sleep study: {Blank single:19197::"yes","no"} Dysnea on exertion:  {Blank single:19197::"yes","no"} Orthopnea/PND: {Blank single:19197::"yes","no"} Chest pain: {Blank single:19197::"yes","no"} Chronic cough: {Blank single:19197::"yes","no"} Lower extremity edema: {Blank single:19197::"yes","no"} Arthralgias:{Blank single:19197::"yes","no"} Myalgias: {Blank single:19197::"yes","no"} Weakness: {Blank single:19197::"yes","no"} Rash: {Blank single:19197::"yes","no"}   Impaired Fasting Glucose HbA1C:  Lab Results  Component Value Date   HGBA1C 6.1 (H) 09/26/2022  Duration of elevated blood sugar:  Polydipsia: {Blank single:19197::"yes","no"} Polyuria: {Blank single:19197::"yes","no"} Weight change: {Blank single:19197::"yes","no"} Visual disturbance: {Blank single:19197::"yes","no"} Glucose Monitoring: {Blank single:19197::"yes","no"}    Accucheck frequency: {Blank single:19197::"Not Checking","Daily","BID","TID"}    Fasting glucose:     Post prandial:  Diabetic Education: {Blank single:19197::"Completed","Not Completed"} Family history of diabetes: {Blank single:19197::"yes","no"}   Relevant  past medical, surgical, family and social history reviewed and updated as indicated. Interim medical history since our last visit reviewed. Allergies and medications reviewed and updated.  Review of Systems  Per HPI unless specifically indicated above     Objective:    BP 134/81   Pulse 75   Temp 98.4 F (36.9 C) (Oral)   Ht 5\' 3"  (1.6 m)   Wt (!) 304 lb 3.2 oz (138 kg)   LMP  (LMP Unknown)   SpO2 94%   BMI 53.89 kg/m   Wt Readings from Last 3 Encounters:  03/29/23 (!) 304 lb 3.2 oz (138 kg)  03/17/23 (!) 308 lb 6.4 oz (139.9 kg)  03/06/23 (!) 302 lb (137 kg)    Physical Exam  Results for orders placed or performed in visit on 10/12/22  Basic Metabolic Panel (BMET)   Collection Time: 10/12/22  8:27 AM  Result Value Ref Range   Glucose 98 70 - 99 mg/dL   BUN 15 8 - 27 mg/dL   Creatinine, Ser 4.09  0.57 - 1.00 mg/dL   eGFR 95 >40 JW/JXB/1.47   BUN/Creatinine Ratio 22 12 - 28   Sodium 144 134 - 144 mmol/L   Potassium 4.0 3.5 - 5.2 mmol/L   Chloride 107 (H) 96 - 106 mmol/L   CO2 25 20 - 29 mmol/L   Calcium 9.3 8.7 - 10.3 mg/dL      Assessment & Plan:   Problem List Items Addressed This Visit       Cardiovascular and Mediastinum   Essential hypertension   Relevant Orders   Microalbumin, Urine Waived   Comprehensive metabolic panel   SVT (supraventricular tachycardia) (HCC) - Primary     Other   BMI 50.0-59.9, adult (HCC)   Elevated hemoglobin A1c measurement   Relevant Orders   Bayer DCA Hb A1c Waived   Microalbumin, Urine Waived   History of pulmonary embolus (PE)   Hyperlipidemia   Relevant Orders   Comprehensive metabolic panel   Lipid Panel w/o Chol/HDL Ratio   Lymphedema   Morbid obesity due to excess calories (HCC)   Secondary hypercoagulable state (HCC)     Follow up plan: No follow-ups on file.

## 2023-03-30 ENCOUNTER — Other Ambulatory Visit: Payer: Self-pay | Admitting: Nurse Practitioner

## 2023-03-30 DIAGNOSIS — E876 Hypokalemia: Secondary | ICD-10-CM

## 2023-03-30 LAB — COMPREHENSIVE METABOLIC PANEL
ALT: 9 IU/L (ref 0–32)
AST: 14 IU/L (ref 0–40)
Albumin: 3.8 g/dL — ABNORMAL LOW (ref 3.9–4.9)
Alkaline Phosphatase: 84 IU/L (ref 44–121)
BUN/Creatinine Ratio: 23 (ref 12–28)
BUN: 17 mg/dL (ref 8–27)
Bilirubin Total: 1.2 mg/dL (ref 0.0–1.2)
CO2: 27 mmol/L (ref 20–29)
Calcium: 8.6 mg/dL — ABNORMAL LOW (ref 8.7–10.3)
Chloride: 103 mmol/L (ref 96–106)
Creatinine, Ser: 0.74 mg/dL (ref 0.57–1.00)
Globulin, Total: 2 g/dL (ref 1.5–4.5)
Glucose: 96 mg/dL (ref 70–99)
Potassium: 3.3 mmol/L — ABNORMAL LOW (ref 3.5–5.2)
Sodium: 147 mmol/L — ABNORMAL HIGH (ref 134–144)
Total Protein: 5.8 g/dL — ABNORMAL LOW (ref 6.0–8.5)
eGFR: 88 mL/min/{1.73_m2} (ref >59)

## 2023-03-30 LAB — LIPID PANEL W/O CHOL/HDL RATIO
Cholesterol, Total: 108 mg/dL (ref 100–199)
HDL: 33 mg/dL — ABNORMAL LOW (ref >39)
LDL Chol Calc (NIH): 54 mg/dL (ref 0–99)
Triglycerides: 114 mg/dL (ref 0–149)
VLDL Cholesterol Cal: 21 mg/dL (ref 5–40)

## 2023-03-30 MED ORDER — POTASSIUM CHLORIDE CRYS ER 20 MEQ PO TBCR: 20.0000 meq | EXTENDED_RELEASE_TABLET | Freq: Every day | ORAL | 0 refills | Status: DC

## 2023-03-30 NOTE — Progress Notes (Signed)
 Good morning Jasmine Webb, your labs have returned: - Kidney function, creatinine and eGFR, remains normal, as is liver function, AST and ALT.  - Sodium, salt, level is a bit elevated.  Reduce salt for a few days and increase water intake. - Potassium is on low side as you thought.  I am sending in supplement for this and want you to return in one week for lab visit only.  My staff will help you schedule this. - Calcium a little low, increase calcium rich foods in diet. - Lipid panel is stable.  Any questions? (Needs lab only visit please) Keep being amazing!!  Thank you for allowing me to participate in your care.  I appreciate you. Kindest regards, Tameyah Koch

## 2023-04-04 ENCOUNTER — Telehealth: Payer: Self-pay

## 2023-04-04 ENCOUNTER — Other Ambulatory Visit: Payer: Self-pay | Admitting: Nurse Practitioner

## 2023-04-04 NOTE — Progress Notes (Signed)
 1st attempt made to reach patient and discuss results. Will try again on next business day. Ok for E2C2 to review results if call is returned.

## 2023-04-04 NOTE — Telephone Encounter (Signed)
 Requested Prescriptions  Pending Prescriptions Disp Refills   metoprolol tartrate (LOPRESSOR) 25 MG tablet [Pharmacy Med Name: METOPROLOL TARTRATE 25 MG TAB] 180 tablet 1    Sig: TAKE 1 TABLET BY MOUTH TWICE A DAY     Cardiovascular:  Beta Blockers Passed - 04/04/2023  5:48 PM      Passed - Last BP in normal range    BP Readings from Last 1 Encounters:  03/29/23 134/81         Passed - Last Heart Rate in normal range    Pulse Readings from Last 1 Encounters:  03/29/23 75         Passed - Valid encounter within last 6 months    Recent Outpatient Visits           2 months ago Subacute cough   [place] [facility] [place], [place] T, NP   2 months ago Subacute cough   [place] [facility] [place], [place] T, NP   6 months ago Morbid obesity due to excess calories [facility])   [place] [facility] [place], Corrie Dandy T, NP   10 months ago Ingrown right greater toenail   [place] [facility] [place], Megan P, DO   10 months ago Ingrown right greater toenail    [facility] [place], Dorie Rank, NP

## 2023-04-04 NOTE — Telephone Encounter (Signed)
 Noted.   Copied from CRM [id number]. Topic: Clinical - Lab/Test Results >> Apr 04, 2023  1:19 PM Alcus Dad H wrote: Reason for CRM: Lab results from 2/26 have been relayed and patient has no further questions. Lab appointment has also been scheduled for next week.

## 2023-04-07 ENCOUNTER — Other Ambulatory Visit

## 2023-04-07 ENCOUNTER — Other Ambulatory Visit: Payer: Self-pay | Admitting: Nurse Practitioner

## 2023-04-07 DIAGNOSIS — E876 Hypokalemia: Secondary | ICD-10-CM | POA: Diagnosis not present

## 2023-04-07 NOTE — Telephone Encounter (Signed)
 Requested Prescriptions  Pending Prescriptions Disp Refills   levocetirizine (XYZAL) 5 MG tablet [Pharmacy Med Name: LEVOCETIRIZINE 5 MG TABLET] 90 tablet 1    Sig: TAKE 1 TABLET BY MOUTH EVERY DAY IN THE EVENING     Ear, Nose, and Throat:  Antihistamines - levocetirizine dihydrochloride Passed - 04/07/2023 12:18 PM      Passed - Cr in normal range and within 360 days    Creatinine, Ser  Date Value Ref Range Status  03/29/2023 0.74 0.57 - 1.00 mg/dL Final         Passed - eGFR is 10 or above and within 360 days    GFR calc Af Amer  Date Value Ref Range Status  01/07/2020 111 >59 mL/min/1.73 Final    Comment:    **In accordance with recommendations from the NKF-ASN Task force,**   [facility] is in the process of updating its eGFR calculation to the   2021 CKD-EPI creatinine equation that estimates kidney function   without a race variable.    GFR, Estimated  Date Value Ref Range Status  11/13/2020 >60 >60 mL/min Final    Comment:    (NOTE) Calculated using the CKD-EPI Creatinine Equation (2021)    eGFR  Date Value Ref Range Status  03/29/2023 88 >59 mL/min/1.73 Final         Passed - Valid encounter within last 12 months    Recent Outpatient Visits           2 months ago Subacute cough   [place] [facility] [place], [place] T, NP   2 months ago Subacute cough   [place] [facility] [place], [place] T, NP   6 months ago Morbid obesity due to excess calories [facility])   [place] [facility] [place], Corrie Dandy T, NP   10 months ago Ingrown right greater toenail   [place] [facility] [place], Megan P, DO   10 months ago Ingrown right greater toenail   [place] [facility] [place], Dorie Rank, NP

## 2023-04-08 LAB — BASIC METABOLIC PANEL
BUN/Creatinine Ratio: 19 (ref 12–28)
BUN: 15 mg/dL (ref 8–27)
CO2: 26 mmol/L (ref 20–29)
Calcium: 8.9 mg/dL (ref 8.7–10.3)
Chloride: 101 mmol/L (ref 96–106)
Creatinine, Ser: 0.8 mg/dL (ref 0.57–1.00)
Glucose: 98 mg/dL (ref 70–99)
Potassium: 3.7 mmol/L (ref 3.5–5.2)
Sodium: 141 mmol/L (ref 134–144)
eGFR: 80 mL/min/{1.73_m2} (ref >59)

## 2023-04-08 IMAGING — US US EXTREM LOW VENOUS*L*
1 series · 13 of 24 positions shown · non-contrast
Comparison: None.

CLINICAL DATA: pain to left lower extremity with ulcer



[Series 1: us venous img lower uni left (dvt) · portal-venous · 13 of 34 slices shown]
[im 1/34]
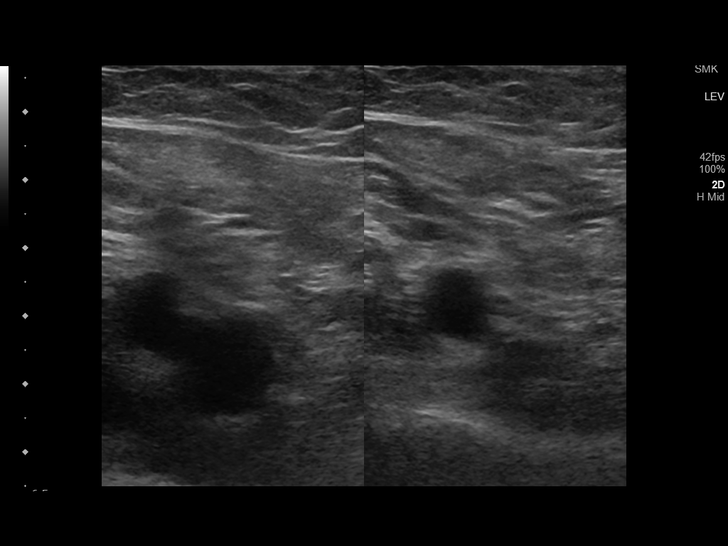
[im 3/34]
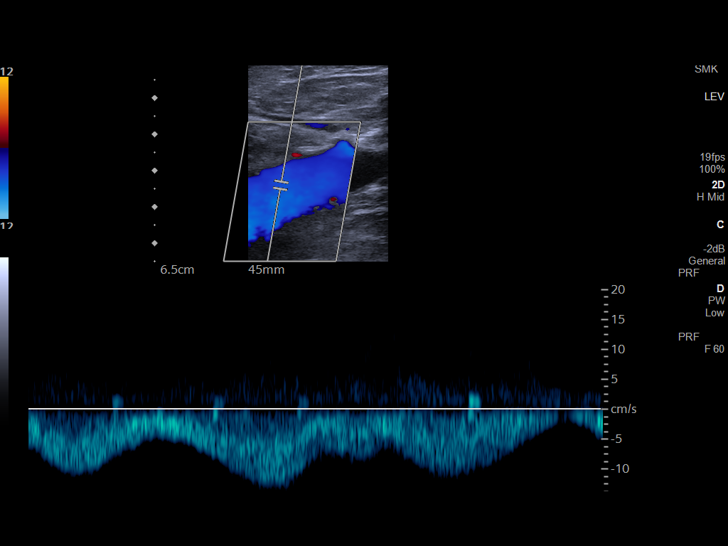
[im 6/34]
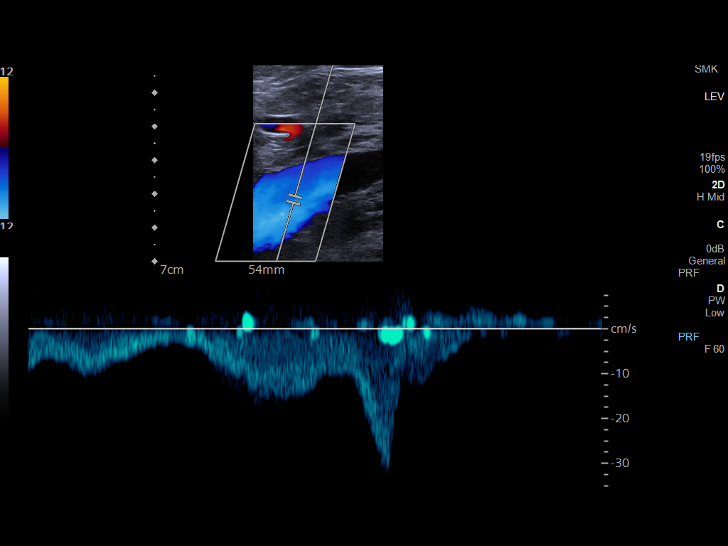
[im 9/34]
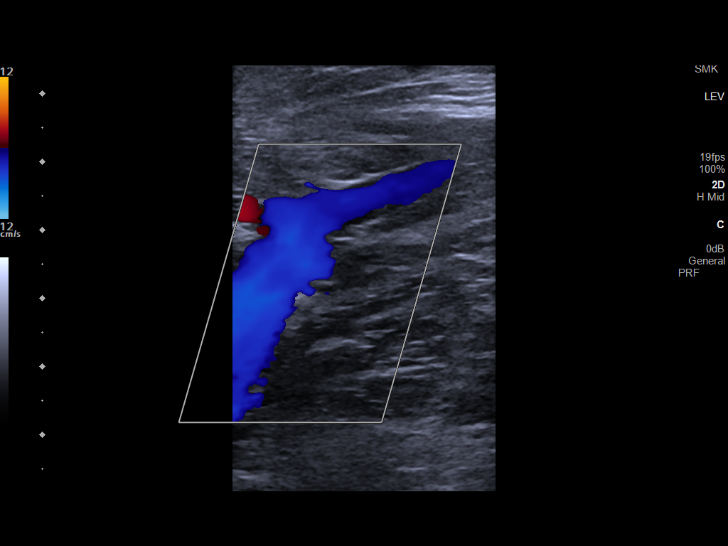
[im 12/34]
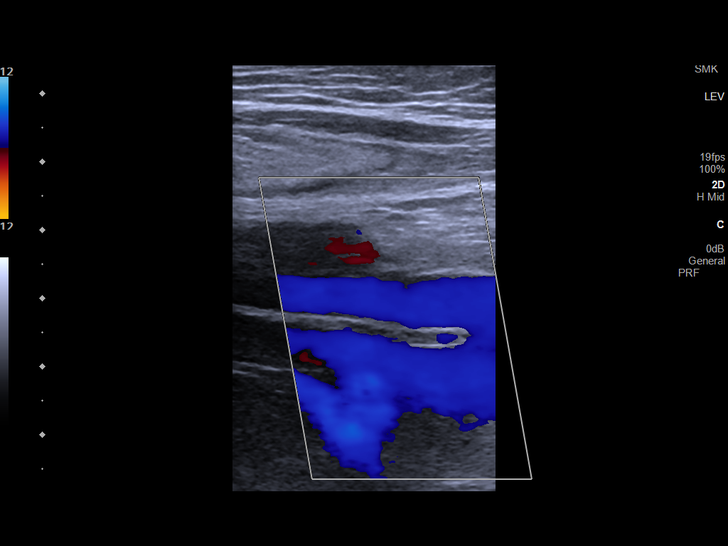
[im 15/34]
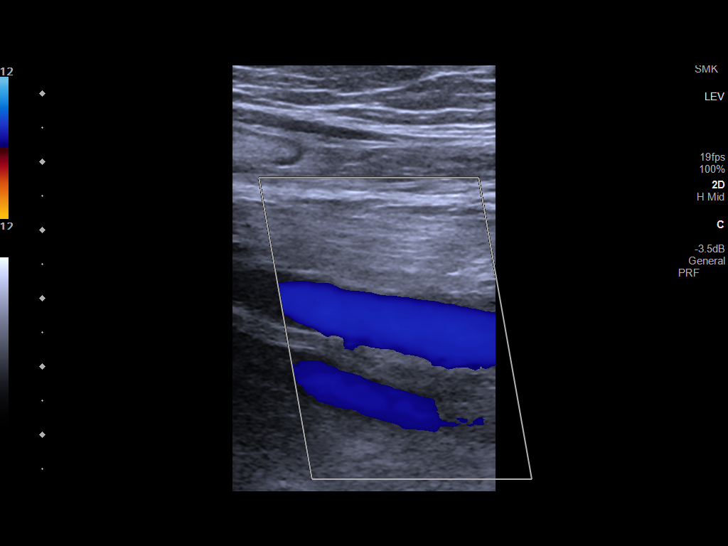
[im 18/34]
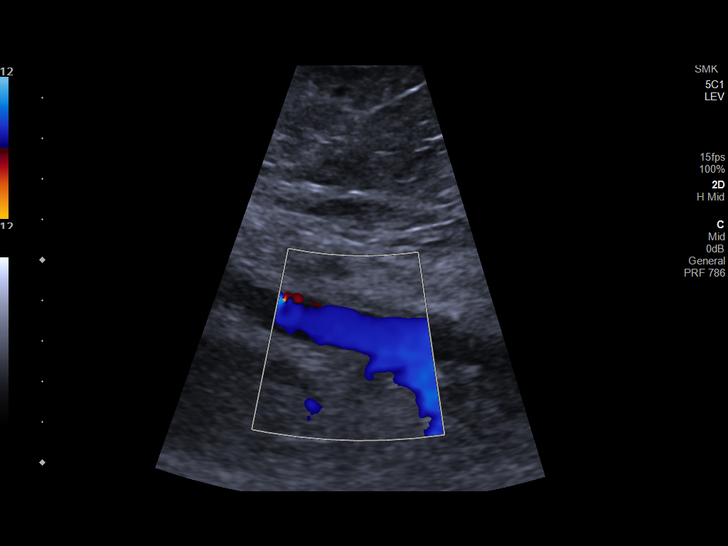
[im 19/34]
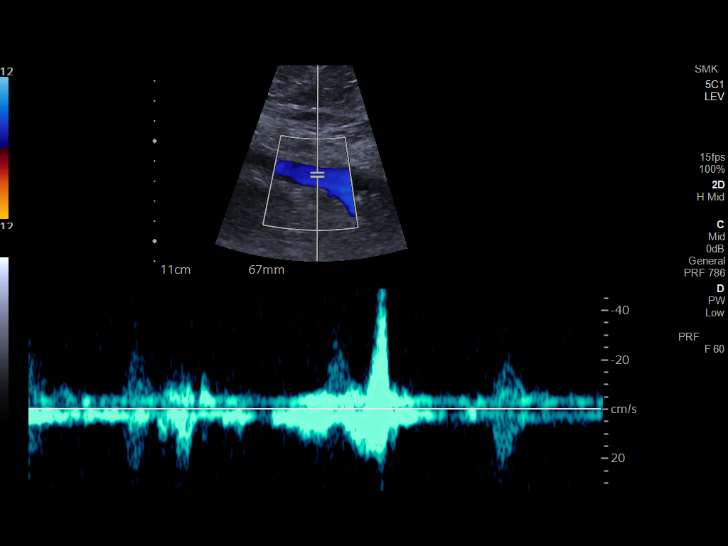
[im 22/34]
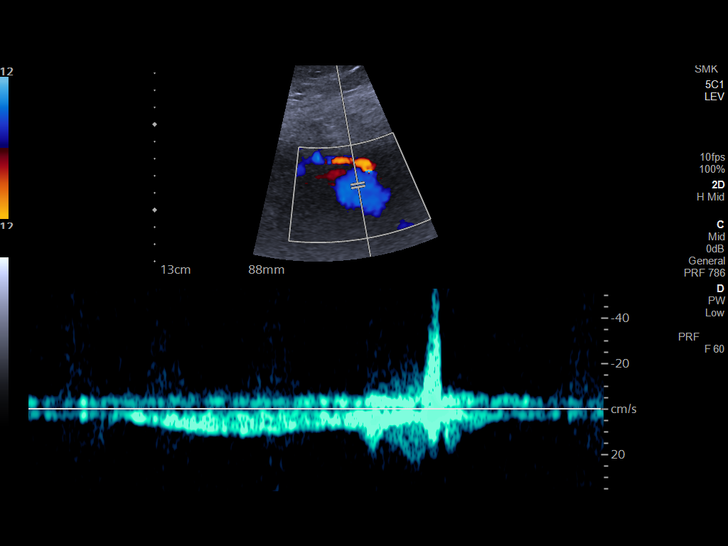
[im 25/34]
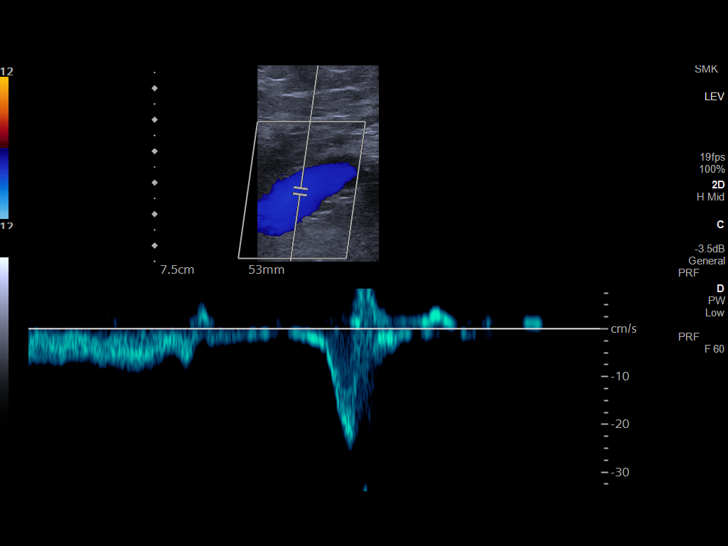
[im 28/34]
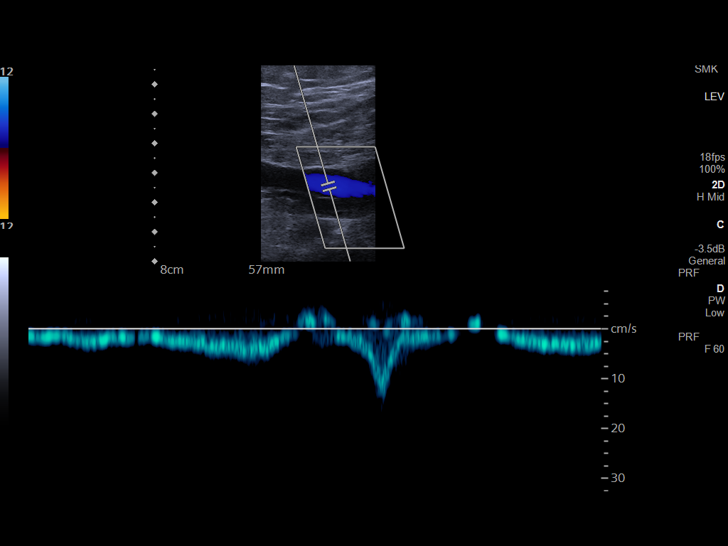
[im 31/34]
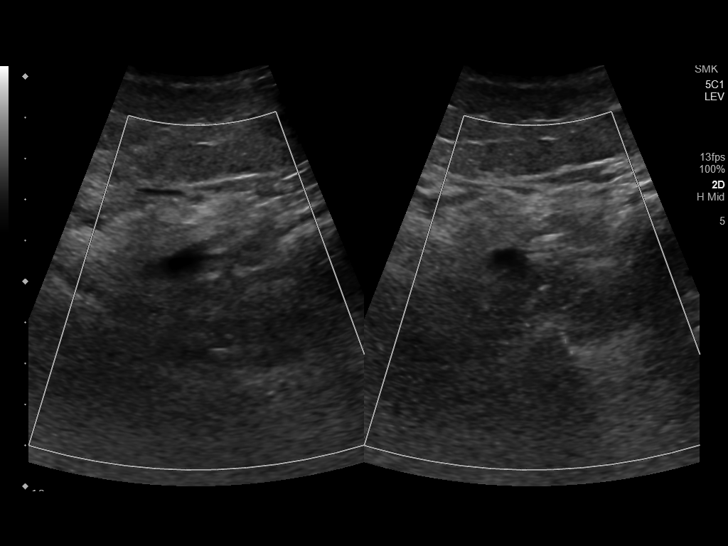
[im 34/34]
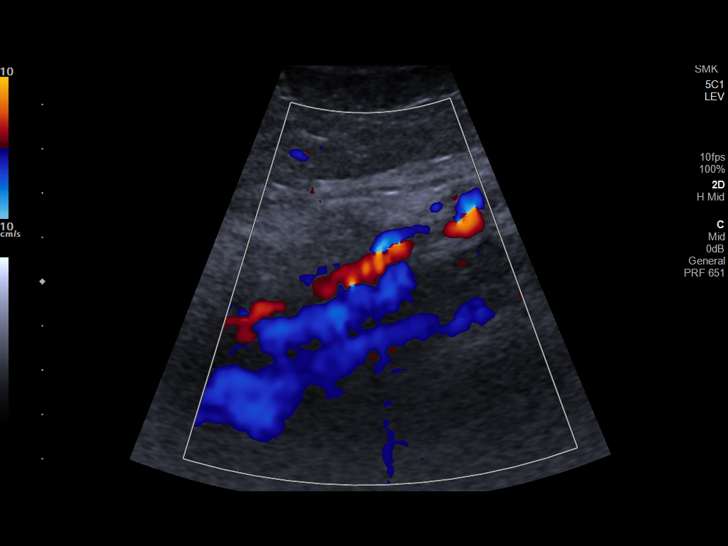

[13 of 24 positions shown; findings below may reference images not displayed]

FINDINGS: Contralateral Common Femoral Vein: Respiratory phasicity is normal
and symmetric with the symptomatic side. No evidence of thrombus.
Normal compressibility.

Common Femoral Vein: No evidence of thrombus. Normal
compressibility, respiratory phasicity and response to augmentation.

Saphenofemoral Junction: No evidence of thrombus. Normal
compressibility and flow on color Doppler imaging.

Profunda Femoral Vein: No evidence of thrombus. Normal
compressibility and flow on color Doppler imaging.

Femoral Vein: No evidence of thrombus. Normal compressibility,
respiratory phasicity and response to augmentation.

Popliteal Vein: No evidence of thrombus. Normal compressibility,
respiratory phasicity and response to augmentation.

Calf Veins: No evidence of thrombus. Normal compressibility and flow
on color Doppler imaging.

Other Findings:  None.
IMPRESSION: No evidence of deep venous thrombosis.

## 2023-04-08 NOTE — Progress Notes (Signed)
 Good morning crew, please let Angeline know labs have returned and are back to normal now.  Continue all current medications and we will continue to monitor at visits.  Any questions? Keep being amazing!!  Thank you for allowing me to participate in your care.  I appreciate you. Kindest regards, Makoa Satz

## 2023-04-18 ENCOUNTER — Other Ambulatory Visit: Payer: Self-pay | Admitting: Nurse Practitioner

## 2023-04-19 NOTE — Telephone Encounter (Signed)
 Requested Prescriptions  Pending Prescriptions Disp Refills   losartan (COZAAR) 100 MG tablet [Pharmacy Med Name: LOSARTAN POTASSIUM 100 MG TAB] 90 tablet 0    Sig: TAKE 1 TABLET BY MOUTH EVERY DAY     Cardiovascular:  Angiotensin Receptor Blockers Passed - 04/19/2023 10:07 AM      Passed - Cr in normal range and within 180 days    Creatinine, Ser  Date Value Ref Range Status  04/07/2023 0.80 0.57 - 1.00 mg/dL Final         Passed - K in normal range and within 180 days    Potassium  Date Value Ref Range Status  04/07/2023 3.7 3.5 - 5.2 mmol/L Final         Passed - Patient is not pregnant      Passed - Last BP in normal range    BP Readings from Last 1 Encounters:  03/29/23 134/81         Passed - Valid encounter within last 6 months    Recent Outpatient Visits           2 months ago Subacute cough   [place] [facility] [place], [place] T, NP   3 months ago Subacute cough   [place] [facility] [place], [place] T, NP   6 months ago Morbid obesity due to excess calories [facility])   [place] [facility] [place], Corrie Dandy T, NP   10 months ago Ingrown right greater toenail   [place] [facility] [place], Megan P, DO   10 months ago Ingrown right greater toenail    [facility] [place], Dorie Rank, NP

## 2023-04-25 ENCOUNTER — Ambulatory Visit: Payer: Medicare Other

## 2023-04-25 VITALS — Ht 63.0 in | Wt 304.0 lb

## 2023-04-25 DIAGNOSIS — Z0000 Encounter for general adult medical examination without abnormal findings: Secondary | ICD-10-CM | POA: Diagnosis not present

## 2023-04-25 NOTE — Patient Instructions (Addendum)
 Jasmine Webb , Thank you for taking time to come for your Medicare Wellness Visit. I appreciate your ongoing commitment to your health goals. Please review the following plan we discussed and let me know if I can assist you in the future.   Referrals/Orders/Follow-Ups/Clinician Recommendations: Recommend getting a routine eye exam yearly. Please call to schedule at your earliest convenience.   This is a list of the screening recommended for you and due dates:  Health Maintenance  Topic Date Due   Mammogram  Never done   Flu Shot  05/01/2023*   Medicare Annual Wellness Visit  04/24/2024   Colon Cancer Screening  07/20/2027   DTaP/Tdap/Td vaccine (2 - Td or Tdap) 06/24/2030   DEXA scan (bone density measurement)  10/13/2032   Pneumonia Vaccine  Completed   Hepatitis C Screening  Completed   Zoster (Shingles) Vaccine  Completed   HPV Vaccine  Aged Out   COVID-19 Vaccine  Discontinued   Cologuard (Stool DNA test)  Discontinued  *Topic was postponed. The date shown is not the original due date.    Advanced directives: (Copy Requested) Please bring a copy of your health care power of attorney and living will to the office to be added to your chart at your convenience. You can mail to [facility] [address] [place], [place] [address] or email to [email or web address]  Next Medicare Annual Wellness Visit scheduled for next year: Yes, 05/07/24 @ 3:10pm (phone visit)

## 2023-04-25 NOTE — Progress Notes (Signed)
 Subjective:   Jasmine Webb is a 68 y.o. who presents for a Medicare Wellness preventive visit.  Visit Complete: Virtual I connected with  Jasmine Webb on 04/25/23 by a audio enabled telemedicine application and verified that I am speaking with the correct person using two identifiers.  Patient Location: Home  Provider Location: Office/Clinic  I discussed the limitations of evaluation and management by telemedicine. The patient expressed understanding and agreed to proceed.  Vital Signs: Because this visit was a virtual/telehealth visit, some criteria may be missing or patient reported. Any vitals not documented were not able to be obtained and vitals that have been documented are patient reported.  VideoDeclined- This patient declined Librarian, academic. Therefore the visit was completed with audio only.  Persons Participating in Visit: Patient.  AWV Questionnaire: No: Patient Medicare AWV questionnaire was not completed prior to this visit.  Cardiac Risk Factors include: advanced age (>5men, >17 women);hypertension;obesity (BMI >30kg/m2);dyslipidemia     Objective:    Today's Vitals   04/25/23 1514  Weight: (!) 304 lb (137.9 kg)  Height: 5\' 3"  (1.6 m)   Body mass index is 53.85 kg/m.     04/25/2023    3:24 PM 07/20/2022    9:00 AM 04/19/2022    3:30 PM 01/19/2021    9:03 AM 11/15/2020    8:04 AM 11/13/2020    8:45 PM 01/13/2020    1:01 PM  Advanced Directives  Does Patient Have a Medical Advance Directive? Yes No No No No No Yes  Type of Estate agent of [place];Living will      Healthcare Power of [place];Living will  Does patient want to make changes to medical advance directive? No - Patient declined        Copy of Healthcare Power of Attorney in Chart? No - copy requested      No - copy requested  Would patient like information on creating a medical advance directive?  No - Patient declined No - Patient declined No  - Patient declined No - Patient declined      Current Medications (verified) Outpatient Encounter Medications as of 04/25/2023  Medication Sig   apixaban (ELIQUIS) 5 MG TABS tablet Take 1 tablet (5 mg total) by mouth 2 (two) times daily.   atorvastatin (LIPITOR) 40 MG tablet Take 1 tablet (40 mg total) by mouth daily.   fluticasone (FLONASE) 50 MCG/ACT nasal spray Place 2 sprays into both nostrils daily.   furosemide (LASIX) 20 MG tablet TAKE 1 TABLET BY MOUTH TWICE A DAY AS NEEDED FOR EDEMA   levocetirizine (XYZAL) 5 MG tablet TAKE 1 TABLET BY MOUTH EVERY DAY IN THE EVENING   losartan (COZAAR) 100 MG tablet TAKE 1 TABLET BY MOUTH EVERY DAY   metoprolol tartrate (LOPRESSOR) 25 MG tablet TAKE 1 TABLET BY MOUTH TWICE A DAY   mupirocin ointment (BACTROBAN) 2 % APPLY TO AFFECTED AREA TWICE A DAY   potassium chloride SA (KLOR-CON M) 20 MEQ tablet Take 1 tablet (20 mEq total) by mouth daily for 6 days.   promethazine-dextromethorphan (PROMETHAZINE-DM) 6.25-15 MG/5ML syrup Take 2.5-5 mLs by mouth 4 (four) times daily as needed for cough. (Patient not taking: Reported on 04/25/2023)   No facility-administered encounter medications on file as of 04/25/2023.    Allergies (verified) Fish allergy, Ibuprofen, and Influenza virus vaccine   History: Past Medical History:  Diagnosis Date   Arthritis    History of emergence delirium    Hyperlipidemia  Hypertension    Tumor    breast and pelvic   Vertigo    Past Surgical History:  Procedure Laterality Date   ABDOMINAL HYSTERECTOMY  2003   BREAST SURGERY     CHOLECYSTECTOMY     COLONOSCOPY WITH PROPOFOL N/A 07/20/2022   Procedure: COLONOSCOPY WITH PROPOFOL;  Surgeon: Midge Minium, MD;  Location: [facility] ENDOSCOPY;  Service: Endoscopy;  Laterality: N/A;   HEMOSTASIS CLIP PLACEMENT  07/20/2022   Procedure: HEMOSTASIS CLIP PLACEMENT;  Surgeon: Midge Minium, MD;  Location: [facility] ENDOSCOPY;  Service: Endoscopy;;   POLYPECTOMY  07/20/2022   Procedure:  POLYPECTOMY;  Surgeon: Midge Minium, MD;  Location: [facility] ENDOSCOPY;  Service: Endoscopy;;   PULMONARY THROMBECTOMY N/A 10/10/2019   Procedure: PULMONARY THROMBECTOMY;  Surgeon: Annice Needy, MD;  Location: [facility] INVASIVE CV LAB;  Service: Cardiovascular;  Laterality: N/A;   SUBMUCOSAL TATTOO INJECTION  07/20/2022   Procedure: SUBMUCOSAL TATTOO INJECTION;  Surgeon: Midge Minium, MD;  Location: [facility] ENDOSCOPY;  Service: Endoscopy;;   Family History  Problem Relation Age of Onset   COPD Mother    ALS Father    Heart disease Sister    Breast cancer Sister    Colon cancer Sister    Diabetes Brother    Hypertension Brother    Asthma Son    Sinusitis Son    Congestive Heart Failure Maternal Uncle    Social History   Socioeconomic History   Marital status: Divorced    Spouse name: Not on file   Number of children: 2   Years of education: Not on file   Highest education level: Not on file  Occupational History   Not on file  Tobacco Use   Smoking status: Never    Passive exposure: Past   Smokeless tobacco: Never  Vaping Use   Vaping status: Never Used  Substance and Sexual Activity   Alcohol use: Never   Drug use: Never   Sexual activity: Not Currently  Other Topics Concern   Not on file  Social History Narrative   Divorced 23 years   Social Drivers of Health   Financial Resource Strain: Low Risk  (04/25/2023)   Overall Financial Resource Strain (CARDIA)    Difficulty of Paying Living Expenses: Not hard at all  Food Insecurity: No Food Insecurity (04/25/2023)   Hunger Vital Sign    Worried About Running Out of Food in the Last Year: Never true    Ran Out of Food in the Last Year: Never true  Transportation Needs: No Transportation Needs (04/25/2023)   PRAPARE - Administrator, Civil Service (Medical): No    Lack of Transportation (Non-Medical): No  Physical Activity: Inactive (04/25/2023)   Exercise Vital Sign    Days of Exercise per Week: 0 days    Minutes of  Exercise per Session: 0 min  Stress: No Stress Concern Present (04/25/2023)   [employer] of Occupational Health - Occupational Stress Questionnaire    Feeling of Stress : Not at all  Social Connections: Moderately Isolated (04/25/2023)   Social Connection and Isolation Panel [NHANES]    Frequency of Communication with Friends and Family: Three times a week    Frequency of Social Gatherings with Friends and Family: More than three times a week    Attends Religious Services: More than 4 times per year    Active Member of [employer] or Organizations: No    Attends Banker Meetings: Never    Marital Status: Divorced  Tobacco Counseling Counseling given: No    Clinical Intake:  Pre-visit preparation completed: Yes  Pain : No/denies pain     BMI - recorded: 53.85 Nutritional Risks: None Diabetes: No  Lab Results  Component Value Date   HGBA1C 5.6 03/29/2023   HGBA1C 6.1 (H) 09/26/2022   HGBA1C 5.9 (H) 03/28/2022     How often do you need to have someone help you when you read instructions, pamphlets, or other written materials from your doctor or pharmacy?: 1 - Never  Interpreter Needed?: No  Information entered by :: Tora Kindred, CMA   Activities of Daily Living     04/25/2023    3:16 PM 09/26/2022    9:12 AM  In your present state of health, do you have any difficulty performing the following activities:  Hearing? 1 0  Comment hearing loss, has been evaluated   Vision? 0 0  Difficulty concentrating or making decisions? 0 0  Walking or climbing stairs? 1 0  Comment cane   Dressing or bathing? 0 0  Doing errands, shopping? 0 0  Preparing Food and eating ? N   Using the Toilet? N   In the past six months, have you accidently leaked urine? Y   Comment wears pad   Do you have problems with loss of bowel control? N   Managing your Medications? N   Managing your Finances? N   Housekeeping or managing your Housekeeping? N     Patient Care  Team: Marjie Skiff, NP as PCP - General (Nurse Practitioner) Iran Ouch, MD as PCP - Cardiology (Cardiology) Midge Minium, MD as Consulting Physician (Gastroenterology)  Indicate any recent Medical Services you may have received from other than [facility] providers in the past year (date may be approximate).     Assessment:   This is a routine wellness examination for [place].  Hearing/Vision screen Hearing Screening - Comments:: Denies hearing loss Vision Screening - Comments:: Needs routine eye exam, [facility]   Goals Addressed             This Visit's Progress    Patient Stated       Maintain current health and keep active       Depression Screen     04/25/2023    3:22 PM 03/29/2023    8:13 AM 04/19/2022    3:29 PM 03/28/2022   11:05 AM 04/01/2021    9:13 AM 02/08/2021    3:52 PM 01/19/2021    9:13 AM  PHQ 2/9 Scores  PHQ - 2 Score 0 0 0 0 0 0 0  PHQ- 9 Score 1 3 0 0 0 0     Fall Risk     04/25/2023    3:27 PM 09/26/2022    9:12 AM 04/19/2022    3:31 PM 04/01/2021    9:13 AM 01/19/2021    9:03 AM  Fall Risk   Falls in the past year? 0 0 0 0 0  Number falls in past yr: 0 0 0 0 0  Injury with Fall? 0 0 0 0 0  Risk for fall due to : No Fall Risks No Fall Risks No Fall Risks No Fall Risks   Follow up Falls prevention discussed;Falls evaluation completed Education provided Falls prevention discussed;Falls evaluation completed Falls evaluation completed Falls evaluation completed;Falls prevention discussed    MEDICARE RISK AT HOME:  Medicare Risk at Home Any stairs in or around the home?: No (has a ramp) If so,  are there any without handrails?: No Home free of loose throw rugs in walkways, pet beds, electrical cords, etc?: Yes Adequate lighting in your home to reduce risk of falls?: Yes Life alert?: No Use of a cane, walker or w/c?: Yes (cane) Grab bars in the bathroom?: Yes Shower chair or bench in shower?: Yes Elevated toilet seat or a  handicapped toilet?: Yes  TIMED UP AND GO:  Was the test performed?  No  Cognitive Function: 6CIT completed        04/25/2023    3:29 PM 04/19/2022    3:35 PM 01/13/2020    1:04 PM  6CIT Screen  What Year? 0 points 0 points 0 points  What month? 0 points 0 points 0 points  What time? 0 points 0 points 0 points  Count back from 20 0 points 0 points 0 points  Months in reverse 0 points 0 points 2 points  Repeat phrase 4 points 2 points 2 points  Total Score 4 points 2 points 4 points    Immunizations Immunization History  Administered Date(s) Administered   PNEUMOCOCCAL CONJUGATE-20 09/26/2022   Pneumococcal Conjugate-13 02/08/2021   Tdap 06/23/2020   Zoster Recombinant(Shingrix) 12/04/2021, 02/11/2022    Screening Tests Health Maintenance  Topic Date Due   MAMMOGRAM  Never done   INFLUENZA VACCINE  05/01/2023 (Originally 09/01/2022)   Medicare Annual Wellness (AWV)  04/24/2024   Colonoscopy  07/20/2027   DTaP/Tdap/Td (2 - Td or Tdap) 06/24/2030   DEXA SCAN  10/13/2032   Pneumonia Vaccine 67+ Years old  Completed   Hepatitis C Screening  Completed   Zoster Vaccines- Shingrix  Completed   HPV VACCINES  Aged Out   COVID-19 Vaccine  Discontinued   Fecal DNA (Cologuard)  Discontinued    Health Maintenance  Health Maintenance Due  Topic Date Due   MAMMOGRAM  Never done   Health Maintenance Items Addressed: See Nurse Notes  Additional Screening:  Vision Screening: Recommended annual ophthalmology exams for early detection of glaucoma and other disorders of the eye.  Dental Screening: Recommended annual dental exams for proper oral hygiene  Community Resource Referral / Chronic Care Management: CRR required this visit?  No   CCM required this visit?  No     Plan:     I have personally reviewed and noted the following in the patient's chart:   Medical and social history Use of alcohol, tobacco or illicit drugs  Current medications and supplements  including opioid prescriptions. Patient is not currently taking opioid prescriptions. Functional ability and status Nutritional status Physical activity Advanced directives List of other physicians Hospitalizations, surgeries, and ER visits in previous 12 months Vitals Screenings to include cognitive, depression, and falls Referrals and appointments  In addition, I have reviewed and discussed with patient certain preventive protocols, quality metrics, and best practice recommendations. A written personalized care plan for preventive services as well as general preventive health recommendations were provided to patient.     Tora Kindred, CMA   04/25/2023   After Visit Summary: (Mail) Due to this being a telephonic visit, the after visit summary with patients personalized plan was offered to patient via mail   Notes:  6 CIT Score - 4 Needs routine eye exam. Patient to call and schedule. Declined flu vaccine. Patient states she is allergic. Added to her drug allergies Declined MMG

## 2023-06-12 ENCOUNTER — Other Ambulatory Visit: Payer: Self-pay | Admitting: Nurse Practitioner

## 2023-08-02 ENCOUNTER — Other Ambulatory Visit: Payer: Self-pay | Admitting: Nurse Practitioner

## 2023-08-03 NOTE — Telephone Encounter (Signed)
 Requested medication (s) are due for refill today: Yes  Requested medication (s) are on the active medication list: Yes  Last refill:  08/11/22  Future visit scheduled: Yes  Notes to clinic:  Unable to refill due to no refill protocol for this medication.      Requested Prescriptions  Pending Prescriptions Disp Refills   mupirocin  ointment (BACTROBAN ) 2 % [Pharmacy Med Name: MUPIROCIN  2% OINTMENT] 22 g 3    Sig: APPLY TO AFFECTED AREA TWICE A DAY     Off-Protocol Failed - 08/03/2023  9:01 PM      Failed - Medication not assigned to a protocol, review manually.      Passed - Valid encounter within last 12 months    Recent Outpatient Visits           4 months ago SVT (supraventricular tachycardia) (HCC)   [place] [facility] [place], [place] T, NP   5 months ago Flu-like symptoms   [place] [facility] [place] Leavy Mole, [place]              Signed Prescriptions Disp Refills   losartan  (COZAAR ) 100 MG tablet 90 tablet 0    Sig: TAKE 1 TABLET BY MOUTH EVERY DAY     Cardiovascular:  Angiotensin Receptor Blockers Passed - 08/03/2023  9:01 PM      Passed - Cr in normal range and within 180 days    Creatinine, Ser  Date Value Ref Range Status  04/07/2023 0.80 0.57 - 1.00 mg/dL Final         Passed - K in normal range and within 180 days    Potassium  Date Value Ref Range Status  04/07/2023 3.7 3.5 - 5.2 mmol/L Final         Passed - Patient is not pregnant      Passed - Last BP in normal range    BP Readings from Last 1 Encounters:  03/29/23 134/81         Passed - Valid encounter within last 6 months    Recent Outpatient Visits           4 months ago SVT (supraventricular tachycardia) (HCC)   [place] [facility] [place], [place] T, NP   5 months ago Flu-like symptoms   [facility] [place], Leisa, PA-C               metoprolol  tartrate (LOPRESSOR ) 25 MG tablet 180 tablet 0    Sig: TAKE  1 TABLET BY MOUTH TWICE A DAY     Cardiovascular:  Beta Blockers Passed - 08/03/2023  9:01 PM      Passed - Last BP in normal range    BP Readings from Last 1 Encounters:  03/29/23 134/81         Passed - Last Heart Rate in normal range    Pulse Readings from Last 1 Encounters:  03/29/23 75         Passed - Valid encounter within last 6 months    Recent Outpatient Visits           4 months ago SVT (supraventricular tachycardia) (HCC)   [place] [facility] [place], [place] T, NP   5 months ago Flu-like symptoms   [facility] [place], Leisa, PA-C               apixaban  (ELIQUIS ) 5 MG TABS tablet 180 tablet 0    Sig: TAKE  1 TABLET BY MOUTH TWICE A DAY     Hematology:  Anticoagulants - apixaban  Passed - 08/03/2023  9:01 PM      Passed - PLT in normal range and within 360 days    Platelets  Date Value Ref Range Status  09/26/2022 167 150 - 450 x10E3/uL Final         Passed - HGB in normal range and within 360 days    Hemoglobin  Date Value Ref Range Status  09/26/2022 12.4 11.1 - 15.9 g/dL Final         Passed - HCT in normal range and within 360 days    Hematocrit  Date Value Ref Range Status  09/26/2022 39.3 34.0 - 46.6 % Final         Passed - Cr in normal range and within 360 days    Creatinine, Ser  Date Value Ref Range Status  04/07/2023 0.80 0.57 - 1.00 mg/dL Final         Passed - AST in normal range and within 360 days    AST  Date Value Ref Range Status  03/29/2023 14 0 - 40 IU/L Final         Passed - ALT in normal range and within 360 days    ALT  Date Value Ref Range Status  03/29/2023 9 0 - 32 IU/L Final         Passed - Valid encounter within last 12 months    Recent Outpatient Visits           4 months ago SVT (supraventricular tachycardia) (HCC)    [facility] [place], [place] T, NP   5 months ago Flu-like symptoms   [facility] Leavy Mole,  PA-C

## 2023-08-04 ENCOUNTER — Other Ambulatory Visit: Payer: Self-pay

## 2023-08-04 ENCOUNTER — Emergency Department
Admission: EM | Admit: 2023-08-04 | Discharge: 2023-08-04 | Disposition: A | Discharge status: Home / Self Care | Attending: Emergency Medicine | Admitting: Emergency Medicine

## 2023-08-04 ENCOUNTER — Emergency Department

## 2023-08-04 DIAGNOSIS — S66314A Strain of extensor muscle, fascia and tendon of right ring finger at wrist and hand level, initial encounter: Secondary | ICD-10-CM | POA: Diagnosis not present

## 2023-08-04 DIAGNOSIS — I10 Essential (primary) hypertension: Secondary | ICD-10-CM | POA: Diagnosis not present

## 2023-08-04 DIAGNOSIS — S56419A Strain of extensor muscle, fascia and tendon of finger, unspecified finger at forearm level, initial encounter: Secondary | ICD-10-CM

## 2023-08-04 DIAGNOSIS — Y9389 Activity, other specified: Secondary | ICD-10-CM | POA: Diagnosis not present

## 2023-08-04 DIAGNOSIS — S6991XA Unspecified injury of right wrist, hand and finger(s), initial encounter: Secondary | ICD-10-CM | POA: Diagnosis not present

## 2023-08-04 DIAGNOSIS — X501XXA Overexertion from prolonged static or awkward postures, initial encounter: Secondary | ICD-10-CM | POA: Diagnosis not present

## 2023-08-04 DIAGNOSIS — S66312A Strain of extensor muscle, fascia and tendon of right middle finger at wrist and hand level, initial encounter: Secondary | ICD-10-CM | POA: Insufficient documentation

## 2023-08-04 DIAGNOSIS — M19041 Primary osteoarthritis, right hand: Secondary | ICD-10-CM | POA: Diagnosis not present

## 2023-08-04 DIAGNOSIS — S66310A Strain of extensor muscle, fascia and tendon of right index finger at wrist and hand level, initial encounter: Secondary | ICD-10-CM | POA: Insufficient documentation

## 2023-08-04 NOTE — ED Provider Notes (Signed)
 [facility] Provider Note    Event Date/Time   First MD Initiated Contact with Patient 08/04/23 1326     (approximate)   History   Finger Injury   HPI  Jasmine Webb is a 68 y.o. female   presents to the ED with complaint of injury to her right hand.  Patient states that she was giving her dog a bath when she stumbled backwards catching herself to prevent a fall.  Patient states that initially her 2nd, 3rd and 4th fingers were bent in flexion.  She then took a Tylenol  and rested for a few minutes and was able to straighten her index finger and fourth finger and continues to use them this morning.  She states that she tried to straighten her third finger last evening but is very painful.  She also reports that she is having pain on the volar surface of her finger.  No prior injury to her hand.  Patient has a history of hypertension, arthritis, SVT, history of PE, elevated hemoglobin A1c, hepatic steatosis.        Physical Exam   Triage Vital Signs: ED Triage Vitals  Encounter Vitals Group     BP 08/04/23 1306 (!) 160/72     Girls Systolic BP Percentile --      Girls Diastolic BP Percentile --      Boys Systolic BP Percentile --      Boys Diastolic BP Percentile --      Pulse Rate 08/04/23 1306 70     Resp 08/04/23 1306 18     Temp 08/04/23 1306 97.7 F (36.5 C)     Temp Source 08/04/23 1306 Oral     SpO2 08/04/23 1306 93 %     Weight --      Height 08/04/23 1308 5' 3 (1.6 m)     Head Circumference --      Peak Flow --      Pain Score 08/04/23 1307 1     Pain Loc --      Pain Education --      Exclude from Growth Chart --     Most recent vital signs: Vitals:   08/04/23 1306  BP: (!) 160/72  Pulse: 70  Resp: 18  Temp: 97.7 F (36.5 C)  SpO2: 93%     General: Awake, no distress.  CV:  Good peripheral perfusion.  Resp:  Normal effort.  Abd:  No distention.  Other:  Right hand with third digit flexed.  Skin is intact.  Motor and sensory  function intact.  Range of motion limited secondary to increased pain.  Tender to palpation volar aspect of the third digit into the distal metacarpal area.  Patient is able to flex and extend her index and fourth digit without any difficulty and no pain with range of motion.  Capillary refill and motor sensory function intact in the knees to digits.   ED Results / Procedures / Treatments   Labs (all labs ordered are listed, but only abnormal results are displayed) Labs Reviewed - No data to display   RADIOLOGY Third digit x-ray images reviewed and interpreted by self independent radiologist with third digit flexed.  No obvious fracture or dislocation noted.  Radiology report agrees with flexion deformity without fracture or dislocation.    PROCEDURES:  Critical Care performed:   Procedures   MEDICATIONS ORDERED IN ED: Medications - No data to display   IMPRESSION / MDM / ASSESSMENT AND PLAN /  ED COURSE  I reviewed the triage vital signs and the nursing notes.   Differential diagnosis includes, but is not limited to, fracture, dislocation, tendon injury, to the right hand.  68 year old female presents to the ED with injury to her right hand that occurred last evening.  Patient states that she did not actually fall but used her right hand to catch her fall while bathing her dog.  She states that initially her 2nd, 3rd and 4th fingers were bent.  After taking some Tylenol  she was able to straighten the 2nd and 4th fingers which have continued to have normal range of motion today.  The third digit however remains bent and she has some pain and discomfort with palpation of the volar surface over the third metacarpal area.  I contacted Dr. Tobie and discussed this with him as he is the orthopedist on-call today.  He advised referral to hand specialist in [facility] for follow-up as most likely this is a extensor tendon injury/*sagittal band injury.  Patient was splinted with third digit in  place.  Patient was offered narcotics however she states that she would prefer to continue taking Tylenol  if needed.  She is agreeable with the plan mentioned above.  [facility] information was given to the patient to contact on Monday for follow-up appointment.  She is aware that she may return to the emergency department if any severe worsening of her symptoms or urgent concerns.      Patient's presentation is most consistent with acute complicated illness / injury requiring diagnostic workup.  FINAL CLINICAL IMPRESSION(S) / ED DIAGNOSES   Final diagnoses:  Traumatic rupture of extensor tendon of finger     Rx / DC Orders   ED Discharge Orders     None        Note:  This document was prepared using Dragon voice recognition software and may include unintentional dictation errors.   Saunders Shona CROME, PA-C 08/04/23 1718    Suzanne Kirsch, MD 08/06/23 [phone]

## 2023-08-04 NOTE — ED Notes (Signed)
 Finger splint applied to middle finger L hand

## 2023-08-04 NOTE — Discharge Instructions (Signed)
 Call the hand surgery center on Monday.  Leave splint on for added protection and support.  Ice and elevation if needed for swelling.  Continue with your Tylenol  as needed.

## 2023-08-04 NOTE — ED Triage Notes (Signed)
 Pt was bathing her dog and stumble backwards. She caught herself on her R hand and says she has been unable to straighten her middle finger and it is painful. Pt concerned for fracture.

## 2023-08-15 DIAGNOSIS — M65331 Trigger finger, right middle finger: Secondary | ICD-10-CM | POA: Diagnosis not present

## 2023-09-30 ENCOUNTER — Other Ambulatory Visit: Payer: Self-pay | Admitting: Nurse Practitioner

## 2023-10-01 NOTE — Patient Instructions (Signed)
 Be Involved in Caring For Your Health:  Taking Medications When medications are taken as directed, they can greatly improve your health. But if they are not taken as prescribed, they may not work. In some cases, not taking them correctly can be harmful. To help ensure your treatment remains effective and safe, understand your medications and how to take them. Bring your medications to each visit for review by your provider.  Your lab results, notes, and after visit summary will be available on My Chart. We strongly encourage you to use this feature. If lab results are abnormal the clinic will contact you with the appropriate steps. If the clinic does not contact you assume the results are satisfactory. You can always view your results on My Chart. If you have questions regarding your health or results, please contact the clinic during office hours. You can also ask questions on My Chart.  We at [facility] are grateful that you chose [place] to provide your care. We strive to provide evidence-based and compassionate care and are always looking for feedback. If you get a survey from the clinic please complete this so we can hear your opinions.  Heart-Healthy Eating Plan Many factors influence your heart health, including eating and exercise habits. Heart health is also called coronary health. Coronary risk increases with abnormal blood fat (lipid) levels. A heart-healthy eating plan includes limiting unhealthy fats, increasing healthy fats, limiting salt (sodium) intake, and making other diet and lifestyle changes. What is my plan? Your health care provider may recommend that: You limit your fat intake to _________% or less of your total calories each day. You limit your saturated fat intake to _________% or less of your total calories each day. You limit the amount of cholesterol in your diet to less than _________ mg per day. You limit the amount of sodium in your diet to less than _________  mg per day. What are tips for following this plan? Cooking Cook foods using methods other than frying. Baking, boiling, grilling, and broiling are all good options. Other ways to reduce fat include: Removing the skin from poultry. Removing all visible fats from meats. Steaming vegetables in water or broth. Meal planning  At meals, imagine dividing your plate into fourths: Fill one-half of your plate with vegetables and green salads. Fill one-fourth of your plate with whole grains. Fill one-fourth of your plate with lean protein foods. Eat 2-4 cups of vegetables per day. One cup of vegetables equals 1 cup (91 g) broccoli or cauliflower florets, 2 medium carrots, 1 large bell pepper, 1 large sweet potato, 1 large tomato, 1 medium white potato, 2 cups (150 g) raw leafy greens. Eat 1-2 cups of fruit per day. One cup of fruit equals 1 small apple, 1 large banana, 1 cup (237 g) mixed fruit, 1 large orange,  cup (82 g) dried fruit, 1 cup (240 mL) 100% fruit juice. Eat more foods that contain soluble fiber. Examples include apples, broccoli, carrots, beans, peas, and barley. Aim to get 25-30 g of fiber per day. Increase your consumption of legumes, nuts, and seeds to 4-5 servings per week. One serving of dried beans or legumes equals  cup (90 g) cooked, 1 serving of nuts is  oz (12 almonds, 24 pistachios, or 7 walnut halves), and 1 serving of seeds equals  oz (8 g). Fats Choose healthy fats more often. Choose monounsaturated and polyunsaturated fats, such as olive and canola oils, avocado oil, flaxseeds, walnuts, almonds, and seeds. Eat  more omega-3 fats. Choose salmon, mackerel, sardines, tuna, flaxseed oil, and ground flaxseeds. Aim to eat fish at least 2 times each week. Check food labels carefully to identify foods with trans fats or high amounts of saturated fat. Limit saturated fats. These are found in animal products, such as meats, butter, and cream. Plant sources of saturated fats  include palm oil, palm kernel oil, and coconut oil. Avoid foods with partially hydrogenated oils in them. These contain trans fats. Examples are stick margarine, some tub margarines, cookies, crackers, and other baked goods. Avoid fried foods. General information Eat more home-cooked food and less restaurant, buffet, and fast food. Limit or avoid alcohol. Limit foods that are high in added sugar and simple starches such as foods made using white refined flour (white breads, pastries, sweets). Lose weight if you are overweight. Losing just 5-10% of your body weight can help your overall health and prevent diseases such as diabetes and heart disease. Monitor your sodium intake, especially if you have high blood pressure. Talk with your health care provider about your sodium intake. Try to incorporate more vegetarian meals weekly. What foods should I eat? Fruits All fresh, canned (in natural juice), or frozen fruits. Vegetables Fresh or frozen vegetables (raw, steamed, roasted, or grilled). Green salads. Grains Most grains. Choose whole wheat and whole grains most of the time. Rice and pasta, including brown rice and pastas made with whole wheat. Meats and other proteins Lean, well-trimmed beef, veal, pork, and lamb. Chicken and [place] without skin. All fish and shellfish. Wild duck, rabbit, pheasant, and venison. Egg whites or low-cholesterol egg substitutes. Dried beans, peas, lentils, and tofu. Seeds and most nuts. Dairy Low-fat or nonfat cheeses, including ricotta and mozzarella. Skim or 1% milk (liquid, powdered, or evaporated). Buttermilk made with low-fat milk. Nonfat or low-fat yogurt. Fats and oils Non-hydrogenated (trans-free) margarines. Vegetable oils, including soybean, sesame, sunflower, olive, avocado, peanut, safflower, corn, canola, and cottonseed. Salad dressings or mayonnaise made with a vegetable oil. Beverages Water (mineral or sparkling). Coffee and tea. Unsweetened ice  tea. Diet beverages. Sweets and desserts Sherbet, gelatin, and fruit ice. Small amounts of dark chocolate. Limit all sweets and desserts. Seasonings and condiments All seasonings and condiments. The items listed above may not be a complete list of foods and beverages you can eat. Contact a dietitian for more options. What foods should I avoid? Fruits Canned fruit in heavy syrup. Fruit in cream or butter sauce. Fried fruit. Limit coconut. Vegetables Vegetables cooked in cheese, cream, or butter sauce. Fried vegetables. Grains Breads made with saturated or trans fats, oils, or whole milk. Croissants. Sweet rolls. Donuts. High-fat crackers, such as cheese crackers and chips. Meats and other proteins Fatty meats, such as hot dogs, ribs, sausage, bacon, rib-eye roast or steak. High-fat deli meats, such as salami and bologna. Caviar. Domestic duck and goose. Organ meats, such as liver. Dairy Cream, sour cream, cream cheese, and creamed cottage cheese. Whole-milk cheeses. Whole or 2% milk (liquid, evaporated, or condensed). Whole buttermilk. Cream sauce or high-fat cheese sauce. Whole-milk yogurt. Fats and oils Meat fat, or shortening. Cocoa butter, hydrogenated oils, palm oil, coconut oil, palm kernel oil. Solid fats and shortenings, including bacon fat, salt pork, lard, and butter. Nondairy cream substitutes. Salad dressings with cheese or sour cream. Beverages Regular sodas and any drinks with added sugar. Sweets and desserts Frosting. Pudding. Cookies. Cakes. Pies. Milk chocolate or white chocolate. Buttered syrups. Full-fat ice cream or ice cream drinks. The items listed above may  not be a complete list of foods and beverages to avoid. Contact a dietitian for more information. Summary Heart-healthy meal planning includes limiting unhealthy fats, increasing healthy fats, limiting salt (sodium) intake and making other diet and lifestyle changes. Lose weight if you are overweight. Losing just  5-10% of your body weight can help your overall health and prevent diseases such as diabetes and heart disease. Focus on eating a balance of foods, including fruits and vegetables, low-fat or nonfat dairy, lean protein, nuts and legumes, whole grains, and heart-healthy oils and fats. This information is not intended to replace advice given to you by your health care provider. Make sure you discuss any questions you have with your health care provider. Document Revised: 02/22/2021 Document Reviewed: 02/22/2021 Elsevier Patient Education  2024 [employer].

## 2023-10-02 NOTE — Telephone Encounter (Signed)
 Requested Prescriptions  Pending Prescriptions Disp Refills   levocetirizine (XYZAL ) 5 MG tablet [Pharmacy Med Name: LEVOCETIRIZINE 5 MG TABLET] 90 tablet 1    Sig: TAKE 1 TABLET BY MOUTH EVERY DAY IN THE EVENING     Ear, Nose, and Throat:  Antihistamines - levocetirizine dihydrochloride  Passed - 10/02/2023 10:05 AM      Passed - Cr in normal range and within 360 days    Creatinine, Ser  Date Value Ref Range Status  04/07/2023 0.80 0.57 - 1.00 mg/dL Final         Passed - eGFR is 10 or above and within 360 days    GFR calc Af Amer  Date Value Ref Range Status  01/07/2020 111 >59 mL/min/1.73 Final    Comment:    **In accordance with recommendations from the NKF-ASN Task force,**   [facility] is in the process of updating its eGFR calculation to the   2021 CKD-EPI creatinine equation that estimates kidney function   without a race variable.    GFR, Estimated  Date Value Ref Range Status  11/13/2020 >60 >60 mL/min Final    Comment:    (NOTE) Calculated using the CKD-EPI Creatinine Equation (2021)    eGFR  Date Value Ref Range Status  04/07/2023 80 >59 mL/min/1.73 Final         Passed - Valid encounter within last 12 months    Recent Outpatient Visits           6 months ago SVT (supraventricular tachycardia) (HCC)   [place] W[place]ley, Melanie T, NP   7 months ago Flu-like symptoms   [facility] Leavy Mole, PA-C

## 2023-10-05 ENCOUNTER — Encounter: Payer: Self-pay | Admitting: Nurse Practitioner

## 2023-10-05 ENCOUNTER — Ambulatory Visit (INDEPENDENT_AMBULATORY_CARE_PROVIDER_SITE_OTHER): Payer: 59 | Admitting: Nurse Practitioner

## 2023-10-05 VITALS — BP 116/75 | HR 52 | Ht 63.0 in | Wt 291.0 lb

## 2023-10-05 DIAGNOSIS — I890 Lymphedema, not elsewhere classified: Secondary | ICD-10-CM | POA: Diagnosis not present

## 2023-10-05 DIAGNOSIS — I10 Essential (primary) hypertension: Secondary | ICD-10-CM | POA: Diagnosis not present

## 2023-10-05 DIAGNOSIS — Z0000 Encounter for general adult medical examination without abnormal findings: Secondary | ICD-10-CM

## 2023-10-05 DIAGNOSIS — E559 Vitamin D deficiency, unspecified: Secondary | ICD-10-CM

## 2023-10-05 DIAGNOSIS — R7309 Other abnormal glucose: Secondary | ICD-10-CM | POA: Diagnosis not present

## 2023-10-05 DIAGNOSIS — D6869 Other thrombophilia: Secondary | ICD-10-CM | POA: Diagnosis not present

## 2023-10-05 DIAGNOSIS — Z6841 Body Mass Index (BMI) 40.0 and over, adult: Secondary | ICD-10-CM

## 2023-10-05 DIAGNOSIS — E782 Mixed hyperlipidemia: Secondary | ICD-10-CM

## 2023-10-05 DIAGNOSIS — Z1231 Encounter for screening mammogram for malignant neoplasm of breast: Secondary | ICD-10-CM

## 2023-10-05 DIAGNOSIS — Z86711 Personal history of pulmonary embolism: Secondary | ICD-10-CM | POA: Diagnosis not present

## 2023-10-05 DIAGNOSIS — I471 Supraventricular tachycardia, unspecified: Secondary | ICD-10-CM | POA: Diagnosis not present

## 2023-10-05 LAB — MICROALBUMIN, URINE WAIVED
Creatinine, Urine Waived: 50 mg/dL (ref 10–300)
Microalb, Ur Waived: 10 mg/L (ref 0–19)

## 2023-10-05 LAB — BAYER DCA HB A1C WAIVED: HB A1C (BAYER DCA - WAIVED): 5.7 % — ABNORMAL HIGH (ref 4.8–5.6)

## 2023-10-05 NOTE — Assessment & Plan Note (Signed)
 Recommended eating smaller high protein, low fat meals more frequently and exercising 30 mins a day 5 times a week with a goal of 10-15lb weight loss in the next 3 months. Patient voiced their understanding and motivation to adhere to these recommendations.

## 2023-10-05 NOTE — Assessment & Plan Note (Signed)
 Chronic, stable.  BP at goal in office today.  Recommend she monitor BP at home regularly and document.  Will continue Losartan  100 MG daily, Metoprolol , and Lasix  20 MG for edema.  Would avoid Amlodipine  due to baseline edema to BLE with lymphedema. Focus on DASH diet at home.  Urine ALB 11 October 2023.  Return in 6 months.  Recommend: - Reminded to call for an overnight weight gain of >2 pounds or a weekly weight weight of >5 pounds - not adding salt to his food and has been reading food labels. Reviewed the importance of keeping daily sodium intake to 2000mg  daily

## 2023-10-05 NOTE — Progress Notes (Signed)
 BP 116/75   Pulse (!) 52   Ht 5' 3 (1.6 m)   Wt 291 lb (132 kg)   LMP  (LMP Unknown)   SpO2 96%   BMI 51.55 kg/m    Subjective:    Patient ID: Jasmine Webb, female    DOB: 01-11-1956, 68 y.o.   MRN: [id number]  HPI: Jasmine Webb is a 68 y.o. female presenting on 10/05/2023 for comprehensive medical examination. Current medical complaints include:none  She currently lives with: family Menopausal Symptoms: no  Impaired Fasting Glucose Has been eating more starches. HbA1C:  Lab Results  Component Value Date   HGBA1C 5.6 03/29/2023  Duration of elevated blood sugar: chronic Polydipsia: no Polyuria: no Weight change: no Visual disturbance: no Glucose Monitoring: no    Accucheck frequency: Not Checking    Fasting glucose:     Post prandial:  Diabetic Education: Not Completed Family history of diabetes: yes, father when older   HYPERTENSION / HYPERLIPIDEMIA Taking Losartan  100 MG, Metoprolol  25 MG BID, Lasix , and Atorvastatin  40 MG daily. Takes Eliquis  for history of PE with Covid. Saw cardiology 03/17/23, SVT and history of PE, no changes.  Last visit with vascular was on 03/25/21, to return if any issues with legs, 3-4 years for repeat imaging.  Has compression hose and is wearing them daily for her lymphedema.     Hepatic steatosis noted on past imaging, working on diet changes.  Takes multivitamin for history of low Vit D.  Satisfied with current treatment? yes Duration of hypertension: chronic BP monitoring frequency: 2-3 times week BP range: <130/80 range BP medication side effects: no Duration of hyperlipidemia: chronic Cholesterol medication side effects: no Cholesterol supplements: none Medication compliance: good compliance Aspirin : no Recent stressors: no Recurrent headaches: no Visual changes: no Palpitations: no Dyspnea: ano Chest pain: no Lower extremity edema: at baseline, wearing compression Dizzy/lightheaded: no  Functional Status Survey: Is the  patient deaf or have difficulty hearing?: No Does the patient have difficulty seeing, even when wearing glasses/contacts?: No Does the patient have difficulty concentrating, remembering, or making decisions?: No Does the patient have difficulty walking or climbing stairs?: No Does the patient have difficulty dressing or bathing?: No Does the patient have difficulty doing errands alone such as visiting a doctor's office or shopping?: No   Depression Screen done today and results listed below:     10/05/2023    8:31 AM 04/25/2023    3:22 PM 03/29/2023    8:13 AM 04/19/2022    3:29 PM 03/28/2022   11:05 AM  Depression screen PHQ 2/9  Decreased Interest 0 0 0 0 0  Down, Depressed, Hopeless 0 0 0 0 0  PHQ - 2 Score 0 0 0 0 0  Altered sleeping 0 0 0 0 0  Tired, decreased energy 1 1 1  0 0  Change in appetite 0 0 1 0 0  Feeling bad or failure about yourself  0 0 0 0 0  Trouble concentrating 0 0 0 0 0  Moving slowly or fidgety/restless 0 0 1 0 0  Suicidal thoughts 0 0 0 0 0  PHQ-9 Score 1 1 3  0 0  Difficult doing work/chores  Not difficult at all Not difficult at all Not difficult at all Not difficult at all      10/05/2023    8:31 AM 03/29/2023    8:13 AM 03/28/2022   11:05 AM 04/01/2021    9:13 AM  GAD 7 : Generalized Anxiety Score  Nervous,  Anxious, on Edge 1 0 0 0  Control/stop worrying 0 0 0 0  Worry too much - different things 0 0 0 0  Trouble relaxing 0 0 0 0  Restless 0 0 0 0  Easily annoyed or irritable 0 0 0 0  Afraid - awful might happen 0 0 0 0  Total GAD 7 Score 1 0 0 0  Anxiety Difficulty  Not difficult at all  Not difficult at all      01/19/2021    9:03 AM 04/01/2021    9:13 AM 04/19/2022    3:31 PM 09/26/2022    9:12 AM 04/25/2023    3:27 PM  Fall Risk  Falls in the past year? 0 0 0 0 0  Was there an injury with Fall? 0 0 0 0 0  Fall Risk Category Calculator 0 0 0 0 0  Fall Risk Category (Retired) Low  Low      (RETIRED) Patient Fall Risk Level Low fall risk  Low fall  risk      Patient at Risk for Falls Due to  No Fall Risks No Fall Risks No Fall Risks No Fall Risks  Fall risk Follow up Falls evaluation completed;Falls prevention discussed  Falls evaluation completed  Falls prevention discussed;Falls evaluation completed Education provided Falls prevention discussed;Falls evaluation completed     Data saved with a previous flowsheet row definition    Past Medical History:  Past Medical History:  Diagnosis Date   Arthritis    History of emergence delirium    Hyperlipidemia    Hypertension    Tumor    breast and pelvic   Vertigo     Surgical History:  Past Surgical History:  Procedure Laterality Date   ABDOMINAL HYSTERECTOMY  2003   BREAST SURGERY     CHOLECYSTECTOMY     COLONOSCOPY WITH PROPOFOL  N/A 07/20/2022   Procedure: COLONOSCOPY WITH PROPOFOL ;  Surgeon: Jinny Carmine, MD;  Location: [facility];  Service: Endoscopy;  Laterality: N/A;   HEMOSTASIS CLIP PLACEMENT  07/20/2022   Procedure: HEMOSTASIS CLIP PLACEMENT;  Surgeon: Jinny Carmine, MD;  Location: [facility];  Service: Endoscopy;;   POLYPECTOMY  07/20/2022   Procedure: POLYPECTOMY;  Surgeon: Jinny Carmine, MD;  Location: [facility];  Service: Endoscopy;;   PULMONARY THROMBECTOMY N/A 10/10/2019   Procedure: PULMONARY THROMBECTOMY;  Surgeon: Marea Selinda RAMAN, MD;  Location: [facility];  Service: Cardiovascular;  Laterality: N/A;   SUBMUCOSAL TATTOO INJECTION  07/20/2022   Procedure: SUBMUCOSAL TATTOO INJECTION;  Surgeon: Jinny Carmine, MD;  Location: [facility];  Service: Endoscopy;;    Medications:  Current Outpatient Medications on File Prior to Visit  Medication Sig   apixaban  (ELIQUIS ) 5 MG TABS tablet TAKE 1 TABLET BY MOUTH TWICE A DAY   atorvastatin  (LIPITOR) 40 MG tablet Take 1 tablet (40 mg total) by mouth daily.   fluticasone  (FLONASE ) 50 MCG/ACT nasal spray Place 2 sprays into both nostrils daily.   furosemide  (LASIX ) 20 MG tablet TAKE 1 TABLET BY MOUTH TWICE  A DAY AS NEEDED FOR EDEMA   levocetirizine (XYZAL ) 5 MG tablet TAKE 1 TABLET BY MOUTH EVERY DAY IN THE EVENING   losartan  (COZAAR ) 100 MG tablet TAKE 1 TABLET BY MOUTH EVERY DAY   metoprolol  tartrate (LOPRESSOR ) 25 MG tablet TAKE 1 TABLET BY MOUTH TWICE A DAY   mupirocin  ointment (BACTROBAN ) 2 % APPLY TO AFFECTED AREA TWICE A DAY   potassium chloride  SA (KLOR-CON  M) 20 MEQ tablet Take 1 tablet (  20 mEq total) by mouth daily for 6 days.   No current facility-administered medications on file prior to visit.    Allergies:  Allergies  Allergen Reactions   Fish Allergy    Ibuprofen     Told to avoid ibuprofen related products by doctor.   Influenza Virus Vaccine     Gets very bad case of the flu when she takes    Social History:  Social History   Socioeconomic History   Marital status: Divorced    Spouse name: Not on file   Number of children: 2   Years of education: Not on file   Highest education level: Not on file  Occupational History   Not on file  Tobacco Use   Smoking status: Never    Passive exposure: Past   Smokeless tobacco: Never  Vaping Use   Vaping status: Never Used  Substance and Sexual Activity   Alcohol use: Never   Drug use: Never   Sexual activity: Not Currently  Other Topics Concern   Not on file  Social History Narrative   Divorced 23 years   Social Drivers of Corporate investment banker Strain: Low Risk  (04/25/2023)   Overall Financial Resource Strain (CARDIA)    Difficulty of Paying Living Expenses: Not hard at all  Food Insecurity: No Food Insecurity (04/25/2023)   Hunger Vital Sign    Worried About Running Out of Food in the Last Year: Never true    Ran Out of Food in the Last Year: Never true  Transportation Needs: No Transportation Needs (04/25/2023)   PRAPARE - Administrator, Civil Service (Medical): No    Lack of Transportation (Non-Medical): No  Physical Activity: Inactive (04/25/2023)   Exercise Vital Sign    Days of  Exercise per Week: 0 days    Minutes of Exercise per Session: 0 min  Stress: No Stress Concern Present (04/25/2023)   [employer] of Occupational Health - Occupational Stress Questionnaire    Feeling of Stress : Not at all  Social Connections: Moderately Isolated (04/25/2023)   Social Connection and Isolation Panel    Frequency of Communication with Friends and Family: Three times a week    Frequency of Social Gatherings with Friends and Family: More than three times a week    Attends Religious Services: More than 4 times per year    Active Member of [employer] or Organizations: No    Attends Banker Meetings: Never    Marital Status: Divorced  Catering manager Violence: Not At Risk (04/25/2023)   Humiliation, Afraid, Rape, and Kick questionnaire    Fear of Current or Ex-Partner: No    Emotionally Abused: No    Physically Abused: No    Sexually Abused: No   Social History   Tobacco Use  Smoking Status Never   Passive exposure: Past  Smokeless Tobacco Never   Social History   Substance and Sexual Activity  Alcohol Use Never    Family History:  Family History  Problem Relation Age of Onset   COPD Mother    ALS Father    Heart disease Sister    Breast cancer Sister    Colon cancer Sister    Diabetes Brother    Hypertension Brother    Asthma Son    Sinusitis Son    Congestive Heart Failure Maternal Uncle     Past medical history, surgical history, medications, allergies, family history and social history reviewed with patient today and  changes made to appropriate areas of the chart.   ROS All other ROS negative except what is listed above and in the HPI.      Objective:    BP 116/75   Pulse (!) 52   Ht 5' 3 (1.6 m)   Wt 291 lb (132 kg)   LMP  (LMP Unknown)   SpO2 96%   BMI 51.55 kg/m   Wt Readings from Last 3 Encounters:  10/05/23 291 lb (132 kg)  04/25/23 (!) 304 lb (137.9 kg)  03/29/23 (!) 304 lb 3.2 oz (138 kg)    Physical Exam Vitals  and nursing note reviewed. Exam conducted with a chaperone present.  Constitutional:      General: She is awake. She is not in acute distress.    Appearance: She is well-developed and well-groomed. She is obese. She is not ill-appearing or toxic-appearing.  HENT:     Head: Normocephalic and atraumatic.     Right Ear: Hearing, tympanic membrane, ear canal and external ear normal. No drainage.     Left Ear: Hearing, tympanic membrane, ear canal and external ear normal. No drainage.     Nose: Nose normal.     Right Sinus: No maxillary sinus tenderness or frontal sinus tenderness.     Left Sinus: No maxillary sinus tenderness or frontal sinus tenderness.     Mouth/Throat:     Mouth: Mucous membranes are moist.     Pharynx: Oropharynx is clear. Uvula midline. No pharyngeal swelling, oropharyngeal exudate or posterior oropharyngeal erythema.  Eyes:     General: Lids are normal.        Right eye: No discharge.        Left eye: No discharge.     Extraocular Movements: Extraocular movements intact.     Conjunctiva/sclera: Conjunctivae normal.     Pupils: Pupils are equal, round, and reactive to light.     Visual Fields: Right eye visual fields normal and left eye visual fields normal.  Neck:     Thyroid : No thyromegaly.     Vascular: No carotid bruit.     Trachea: Trachea normal.  Cardiovascular:     Rate and Rhythm: Regular rhythm. Bradycardia present.     Heart sounds: Normal heart sounds. No murmur heard.    No gallop.     Comments: Compression socks on. Pulmonary:     Effort: Pulmonary effort is normal. No accessory muscle usage or respiratory distress.     Breath sounds: Normal breath sounds.  Chest:  Breasts:    Right: Normal.     Left: Normal.  Abdominal:     General: Bowel sounds are normal.     Palpations: Abdomen is soft. There is no hepatomegaly or splenomegaly.     Tenderness: There is no abdominal tenderness.  Musculoskeletal:        General: Normal range of motion.      Cervical back: Normal range of motion and neck supple.     Right lower leg: 1+ Edema present.     Left lower leg: 1+ Edema present.  Lymphadenopathy:     Head:     Right side of head: No submental, submandibular, tonsillar, preauricular or posterior auricular adenopathy.     Left side of head: No submental, submandibular, tonsillar, preauricular or posterior auricular adenopathy.     Cervical: No cervical adenopathy.     Upper Body:     Right upper body: No supraclavicular, axillary or pectoral adenopathy.     Left  upper body: No supraclavicular, axillary or pectoral adenopathy.  Skin:    General: Skin is warm and dry.     Capillary Refill: Capillary refill takes less than 2 seconds.     Findings: No rash.  Neurological:     Mental Status: She is alert and oriented to person, place, and time.     Gait: Gait is intact.     Deep Tendon Reflexes: Reflexes are normal and symmetric.     Reflex Scores:      Brachioradialis reflexes are 2+ on the right side and 2+ on the left side.      Patellar reflexes are 2+ on the right side and 2+ on the left side. Psychiatric:        Attention and Perception: Attention normal.        Mood and Affect: Mood normal.        Speech: Speech normal.        Behavior: Behavior normal. Behavior is cooperative.        Thought Content: Thought content normal.        Judgment: Judgment normal.    Results for orders placed or performed in visit on 04/07/23  Basic metabolic panel   Collection Time: 04/07/23  8:19 AM  Result Value Ref Range   Glucose 98 70 - 99 mg/dL   BUN 15 8 - 27 mg/dL   Creatinine, Ser 9.19 0.57 - 1.00 mg/dL   eGFR 80 >40 fO/fpw/8.26   BUN/Creatinine Ratio 19 12 - 28   Sodium 141 134 - 144 mmol/L   Potassium 3.7 3.5 - 5.2 mmol/L   Chloride 101 96 - 106 mmol/L   CO2 26 20 - 29 mmol/L   Calcium  8.9 8.7 - 10.3 mg/dL      Assessment & Plan:   Problem List Items Addressed This Visit       Cardiovascular and Mediastinum   SVT  (supraventricular tachycardia) (HCC) - Primary   Chronic, stable.  Followed by cardiology, will continue Metoprolol  as ordered by them.  Recent notes and testing reviewed.  She has no cardiac symptoms at this time.  She is to alert provider if symptomatic with lower HR, may need to reduce Metoprolol  if presents.      Essential hypertension   Chronic, stable.  BP at goal in office today.  Recommend she monitor BP at home regularly and document.  Will continue Losartan  100 MG daily, Metoprolol , and Lasix  20 MG for edema.  Would avoid Amlodipine  due to baseline edema to BLE with lymphedema. Focus on DASH diet at home.  Urine ALB 11 October 2023.  Return in 6 months.  Recommend: - Reminded to call for an overnight weight gain of >2 pounds or a weekly weight weight of >5 pounds - not adding salt to his food and has been reading food labels. Reviewed the importance of keeping daily sodium intake to 2000mg  daily       Relevant Orders   Microalbumin, Urine Waived   CBC with Differential/Platelet   TSH     Other   Vitamin D  deficiency   Chronic, stable.  Recommend she continue supplement Vitamin D3 1000 units daily for bone health.  Recheck level today. Normal DEXA in 2024.      Relevant Orders   VITAMIN D  25 Hydroxy (Vit-D Deficiency, Fractures)   Secondary hypercoagulable state (HCC)   Taking Eliquis  due to past PE.  Continue to monitor closely and check CBC annually.      Relevant  Orders   CBC with Differential/Platelet   Morbid obesity due to excess calories (HCC)   BMI 51.55.  Recommended eating smaller high protein, low fat meals more frequently and exercising 30 mins a day 5 times a week with a goal of 10-15lb weight loss in the next 3 months. Patient voiced their understanding and motivation to adhere to these recommendations.       Lymphedema   Hyperlipidemia   Relevant Orders   Comprehensive metabolic panel with GFR   Lipid Panel w/o Chol/HDL Ratio   History of pulmonary  embolus (PE)   Stable.  With Covid in September 2021.  At this time will continue Eliquis  and collaboration with vascular + cardiology.  Refills up to date.  Cardiology recommends lifelong treatment.      Elevated hemoglobin A1c measurement   A1c today remains stable at 5.7% today, mild upward trend.  Continue heavy focus on prediabetic meal changes.      Relevant Orders   Bayer DCA Hb A1c Waived   Microalbumin, Urine Waived   BMI 50.0-59.9, adult (HCC)   Recommended eating smaller high protein, low fat meals more frequently and exercising 30 mins a day 5 times a week with a goal of 10-15lb weight loss in the next 3 months. Patient voiced their understanding and motivation to adhere to these recommendations.       Other Visit Diagnoses       Encounter for annual physical exam       Annual physical today with labs and health maintenance reviewed, discussed with patient.        Follow up plan: Return in about 6 months (around 04/03/2024) for HTN/HLD, SVT, IFG.   LABORATORY TESTING:  - Pap smear: not applicable  IMMUNIZATIONS:   - Tdap: Tetanus vaccination status reviewed: last tetanus booster within 10 years. - Influenza: Refuses - Pneumovax: Up to date - Prevnar: Administered today - COVID: Up to date - HPV: Not applicable - Shingrix  vaccine: Up to date  SCREENING: -Mammogram: Refuses - Colonoscopy: Up to date  - Bone Density: Up To Date -- normal on 10/14/22 -Hearing Test: Not applicable  -Spirometry: Not applicable   PATIENT COUNSELING:   Advised to take 1 mg of folate supplement per day if capable of pregnancy.   Sexuality: Discussed sexually transmitted diseases, partner selection, use of condoms, avoidance of unintended pregnancy  and contraceptive alternatives.   Advised to avoid cigarette smoking.  I discussed with the patient that most people either abstain from alcohol or drink within safe limits (<=14/week and <=4 drinks/occasion for males, <=7/weeks and  <= 3 drinks/occasion for females) and that the risk for alcohol disorders and other health effects rises proportionally with the number of drinks per week and how often a drinker exceeds daily limits.  Discussed cessation/primary prevention of drug use and availability of treatment for abuse.   Diet: Encouraged to adjust caloric intake to maintain  or achieve ideal body weight, to reduce intake of dietary saturated fat and total fat, to limit sodium intake by avoiding high sodium foods and not adding table salt, and to maintain adequate dietary potassium and calcium  preferably from fresh fruits, vegetables, and low-fat dairy products.    Stressed the importance of regular exercise  Injury prevention: Discussed safety belts, safety helmets, smoke detector, smoking near bedding or upholstery.   Dental health: Discussed importance of regular tooth brushing, flossing, and dental visits.    NEXT PREVENTATIVE PHYSICAL DUE IN 1 YEAR. Return in about 6 months (  around 04/03/2024) for HTN/HLD, SVT, IFG.

## 2023-10-05 NOTE — Assessment & Plan Note (Signed)
 Chronic, stable.  Followed by cardiology, will continue Metoprolol  as ordered by them.  Recent notes and testing reviewed.  She has no cardiac symptoms at this time.  She is to alert provider if symptomatic with lower HR, may need to reduce Metoprolol  if presents.

## 2023-10-05 NOTE — Assessment & Plan Note (Signed)
Stable.  With Covid in September 2021.  At this time will continue Eliquis and collaboration with vascular + cardiology.  Refills up to date.  Cardiology recommends lifelong treatment.

## 2023-10-05 NOTE — Assessment & Plan Note (Signed)
 Chronic, stable.  Recommend she continue supplement Vitamin D3 1000 units daily for bone health.  Recheck level today. Normal DEXA in 2024.

## 2023-10-05 NOTE — Assessment & Plan Note (Signed)
Taking Eliquis due to past PE.  Continue to monitor closely and check CBC annually.

## 2023-10-05 NOTE — Assessment & Plan Note (Signed)
 BMI 51.55.  Recommended eating smaller high protein, low fat meals more frequently and exercising 30 mins a day 5 times a week with a goal of 10-15lb weight loss in the next 3 months. Patient voiced their understanding and motivation to adhere to these recommendations.

## 2023-10-05 NOTE — Assessment & Plan Note (Signed)
 A1c today remains stable at 5.7% today, mild upward trend.  Continue heavy focus on prediabetic meal changes.

## 2023-10-06 ENCOUNTER — Ambulatory Visit: Payer: Self-pay | Admitting: Nurse Practitioner

## 2023-10-06 DIAGNOSIS — E876 Hypokalemia: Secondary | ICD-10-CM

## 2023-10-06 DIAGNOSIS — E870 Hyperosmolality and hypernatremia: Secondary | ICD-10-CM

## 2023-10-06 LAB — CBC WITH DIFFERENTIAL/PLATELET
Basophils Absolute: 0 x10E3/uL (ref 0.0–0.2)
Basos: 0 %
EOS (ABSOLUTE): 0.2 x10E3/uL (ref 0.0–0.4)
Eos: 3 %
Hematocrit: 43.5 % (ref 34.0–46.6)
Hemoglobin: 13.7 g/dL (ref 11.1–15.9)
Immature Grans (Abs): 0 x10E3/uL (ref 0.0–0.1)
Immature Granulocytes: 0 %
Lymphocytes Absolute: 1.4 x10E3/uL (ref 0.7–3.1)
Lymphs: 19 %
MCH: 28.6 pg (ref 26.6–33.0)
MCHC: 31.5 g/dL (ref 31.5–35.7)
MCV: 91 fL (ref 79–97)
Monocytes Absolute: 0.5 x10E3/uL (ref 0.1–0.9)
Monocytes: 7 %
Neutrophils Absolute: 5 x10E3/uL (ref 1.4–7.0)
Neutrophils: 71 %
Platelets: 184 x10E3/uL (ref 150–450)
RBC: 4.79 x10E6/uL (ref 3.77–5.28)
RDW: 13 % (ref 11.7–15.4)
WBC: 7.1 x10E3/uL (ref 3.4–10.8)

## 2023-10-06 LAB — COMPREHENSIVE METABOLIC PANEL WITH GFR
ALT: 15 IU/L (ref 0–32)
AST: 17 IU/L (ref 0–40)
Albumin: 4.2 g/dL (ref 3.9–4.9)
Alkaline Phosphatase: 104 IU/L (ref 44–121)
BUN/Creatinine Ratio: 15 (ref 12–28)
BUN: 11 mg/dL (ref 8–27)
Bilirubin Total: 1.4 mg/dL — ABNORMAL HIGH (ref 0.0–1.2)
CO2: 28 mmol/L (ref 20–29)
Calcium: 9.2 mg/dL (ref 8.7–10.3)
Chloride: 102 mmol/L (ref 96–106)
Creatinine, Ser: 0.75 mg/dL (ref 0.57–1.00)
Globulin, Total: 2 g/dL (ref 1.5–4.5)
Glucose: 95 mg/dL (ref 70–99)
Potassium: 3.4 mmol/L — ABNORMAL LOW (ref 3.5–5.2)
Sodium: 145 mmol/L — ABNORMAL HIGH (ref 134–144)
Total Protein: 6.2 g/dL (ref 6.0–8.5)
eGFR: 86 mL/min/1.73 (ref >59)

## 2023-10-06 LAB — LIPID PANEL W/O CHOL/HDL RATIO
Cholesterol, Total: 124 mg/dL (ref 100–199)
HDL: 51 mg/dL (ref >39)
LDL Chol Calc (NIH): 56 mg/dL (ref 0–99)
Triglycerides: 89 mg/dL (ref 0–149)
VLDL Cholesterol Cal: 17 mg/dL (ref 5–40)

## 2023-10-06 LAB — TSH: TSH: 4.13 u[IU]/mL (ref 0.450–4.500)

## 2023-10-06 LAB — VITAMIN D 25 HYDROXY (VIT D DEFICIENCY, FRACTURES): Vit D, 25-Hydroxy: 40 ng/mL (ref 30.0–100.0)

## 2023-10-06 NOTE — Progress Notes (Signed)
 Good afternoon please let Jasmine Webb know her labs have returned: - Kidney and liver function are normal. - Sodium level mildly elevated and potassium mildly low.  I would like to recheck both in 2 weeks, my amazing staff will schedule a lab only visit for you. Try to reduce salt in diet and increase potassium rich foods. - Remainder of labs look great.  Any questions? Keep being amazing!!  Thank you for allowing me to participate in your care.  I appreciate you. Kindest regards, Pratham Cassatt

## 2023-10-24 ENCOUNTER — Other Ambulatory Visit: Payer: Self-pay | Admitting: Nurse Practitioner

## 2023-10-24 NOTE — Telephone Encounter (Signed)
 Requested Prescriptions  Pending Prescriptions Disp Refills   furosemide  (LASIX ) 20 MG tablet [Pharmacy Med Name: FUROSEMIDE  20 MG TABLET] 180 tablet 1    Sig: TAKE 1 TABLET BY MOUTH TWICE A DAY AS NEEDED FOR EDEMA     Cardiovascular:  Diuretics - Loop Failed - 10/24/2023  5:29 PM      Failed - K in normal range and within 180 days    Potassium  Date Value Ref Range Status  10/05/2023 3.4 (L) 3.5 - 5.2 mmol/L Final         Failed - Na in normal range and within 180 days    Sodium  Date Value Ref Range Status  10/05/2023 145 (H) 134 - 144 mmol/L Final         Failed - Mg Level in normal range and within 180 days    Magnesium   Date Value Ref Range Status  10/13/2019 1.9 1.7 - 2.4 mg/dL Final    Comment:    Performed at [facility], [address] [place], [place] [address]         Passed - Ca in normal range and within 180 days    Calcium   Date Value Ref Range Status  10/05/2023 9.2 8.7 - 10.3 mg/dL Final         Passed - Cr in normal range and within 180 days    Creatinine, Ser  Date Value Ref Range Status  10/05/2023 0.75 0.57 - 1.00 mg/dL Final         Passed - Cl in normal range and within 180 days    Chloride  Date Value Ref Range Status  10/05/2023 102 96 - 106 mmol/L Final         Passed - Last BP in normal range    BP Readings from Last 1 Encounters:  10/05/23 116/75         Passed - Valid encounter within last 6 months    Recent Outpatient Visits           2 weeks ago SVT (supraventricular tachycardia)   [place] [facility] [place], Melanie T, NP   6 months ago SVT (supraventricular tachycardia)   [place] [facility] [place], Melanie T, NP   7 months ago Flu-like symptoms   [facility] Leavy Mole, PA-C

## 2023-11-01 ENCOUNTER — Other Ambulatory Visit: Payer: Self-pay | Admitting: Nurse Practitioner

## 2023-11-01 ENCOUNTER — Other Ambulatory Visit

## 2023-11-01 DIAGNOSIS — E870 Hyperosmolality and hypernatremia: Secondary | ICD-10-CM

## 2023-11-01 DIAGNOSIS — E876 Hypokalemia: Secondary | ICD-10-CM | POA: Diagnosis not present

## 2023-11-02 ENCOUNTER — Ambulatory Visit: Payer: Self-pay | Admitting: Nurse Practitioner

## 2023-11-02 DIAGNOSIS — E876 Hypokalemia: Secondary | ICD-10-CM

## 2023-11-02 LAB — SODIUM: Sodium: 142 mmol/L (ref 134–144)

## 2023-11-02 LAB — POTASSIUM: Potassium: 3.4 mmol/L — ABNORMAL LOW (ref 3.5–5.2)

## 2023-11-02 MED ORDER — POTASSIUM CHLORIDE CRYS ER 20 MEQ PO TBCR: 20.0000 meq | EXTENDED_RELEASE_TABLET | Freq: Every day | ORAL | 0 refills | Status: DC

## 2023-11-02 NOTE — Telephone Encounter (Signed)
 Requested Prescriptions  Pending Prescriptions Disp Refills   losartan  (COZAAR ) 100 MG tablet [Pharmacy Med Name: LOSARTAN  POTASSIUM 100 MG TAB] 90 tablet 1    Sig: TAKE 1 TABLET BY MOUTH EVERY DAY     Cardiovascular:  Angiotensin Receptor Blockers Failed - 11/02/2023  2:27 PM      Failed - K in normal range and within 180 days    Potassium  Date Value Ref Range Status  11/01/2023 3.4 (L) 3.5 - 5.2 mmol/L Final         Passed - Cr in normal range and within 180 days    Creatinine, Ser  Date Value Ref Range Status  10/05/2023 0.75 0.57 - 1.00 mg/dL Final         Passed - Patient is not pregnant      Passed - Last BP in normal range    BP Readings from Last 1 Encounters:  10/05/23 116/75         Passed - Valid encounter within last 6 months    Recent Outpatient Visits           4 weeks ago SVT (supraventricular tachycardia)   [facility] [place], Melanie T, NP   7 months ago SVT (supraventricular tachycardia)   [place] [facility] [place], Melanie T, NP   8 months ago Flu-like symptoms   [facility] Leavy Mole, PA-C

## 2023-11-02 NOTE — Progress Notes (Signed)
 Please let Remi know her labs have returned.  Potassium remains a little low, I would like to restart supplement and have sent this in.  Would like to recheck labs outpatient in 4 weeks, will need lab only visit.  Sodium level normal.  Any questions? Keep being stellar!!  Thank you for allowing me to participate in your care.  I appreciate you. Kindest regards, Giordano Getman

## 2023-11-07 ENCOUNTER — Other Ambulatory Visit: Payer: Self-pay | Admitting: Nurse Practitioner

## 2023-11-08 NOTE — Telephone Encounter (Signed)
 Requested medication (s) are due for refill today - unsure  Requested medication (s) are on the active medication list -yes  Future visit scheduled -yes  Last refill: 08/07/23 22g 3RF  Notes to clinic: off protocol- provider review   Requested Prescriptions  Pending Prescriptions Disp Refills   mupirocin  ointment (BACTROBAN ) 2 % [Pharmacy Med Name: MUPIROCIN  2% OINTMENT] 22 g 3    Sig: APPLY TO AFFECTED AREA TWICE A DAY     Off-Protocol Failed - 11/08/2023  2:08 PM      Failed - Medication not assigned to a protocol, review manually.      Passed - Valid encounter within last 12 months    Recent Outpatient Visits           1 month ago SVT (supraventricular tachycardia)   [place] [facility] [place], Carlisle T, NP   7 months ago SVT (supraventricular tachycardia)   [place] [facility] [place], Melanie T, NP   8 months ago Flu-like symptoms   [place] [facility] Leavy Mole, PA-C                 Requested Prescriptions  Pending Prescriptions Disp Refills   mupirocin  ointment (BACTROBAN ) 2 % [Pharmacy Med Name: MUPIROCIN  2% OINTMENT] 22 g 3    Sig: APPLY TO AFFECTED AREA TWICE A DAY     Off-Protocol Failed - 11/08/2023  2:08 PM      Failed - Medication not assigned to a protocol, review manually.      Passed - Valid encounter within last 12 months    Recent Outpatient Visits           1 month ago SVT (supraventricular tachycardia)   [place] [facility] [place], Koosharem T, NP   7 months ago SVT (supraventricular tachycardia)   [place] [facility] [place], Melanie T, NP   8 months ago Flu-like symptoms   [facility] Leavy Mole, PA-C

## 2023-11-25 ENCOUNTER — Other Ambulatory Visit: Payer: Self-pay | Admitting: Nurse Practitioner

## 2023-11-25 DIAGNOSIS — E876 Hypokalemia: Secondary | ICD-10-CM

## 2023-11-27 ENCOUNTER — Telehealth: Payer: Self-pay

## 2023-11-27 NOTE — Telephone Encounter (Unsigned)
 Copied from CRM [id number]. Topic: Clinical - Medication Question >> Nov 27, 2023  8:55 AM Antony RAMAN wrote: Reason for CRM: pt called to say she's extremely tired and dizzy and suspecting its from there BP medication, stated doc told her to let her know if this happens so she can adjust meds

## 2023-11-28 ENCOUNTER — Other Ambulatory Visit: Payer: Self-pay | Admitting: Family Medicine

## 2023-11-28 ENCOUNTER — Other Ambulatory Visit: Payer: Self-pay | Admitting: Nurse Practitioner

## 2023-11-28 DIAGNOSIS — R0989 Other specified symptoms and signs involving the circulatory and respiratory systems: Secondary | ICD-10-CM

## 2023-11-28 DIAGNOSIS — R6889 Other general symptoms and signs: Secondary | ICD-10-CM

## 2023-11-28 NOTE — Telephone Encounter (Signed)
 Scheduled

## 2023-11-28 NOTE — Telephone Encounter (Signed)
 Please call and schedule an appointment for evaluation per Jolene.

## 2023-11-28 NOTE — Telephone Encounter (Signed)
 Requested Prescriptions  Pending Prescriptions Disp Refills   potassium chloride  SA (KLOR-CON  M) 20 MEQ tablet [Pharmacy Med Name: POTASSIUM CL ER 20 MEQ TAB MCR] 90 tablet 1    Sig: TAKE 1 TABLET BY MOUTH EVERY DAY     Endocrinology:  Minerals - Potassium Supplementation Failed - 11/28/2023  8:42 AM      Failed - K in normal range and within 360 days    Potassium  Date Value Ref Range Status  11/01/2023 3.4 (L) 3.5 - 5.2 mmol/L Final         Passed - Cr in normal range and within 360 days    Creatinine, Ser  Date Value Ref Range Status  10/05/2023 0.75 0.57 - 1.00 mg/dL Final         Passed - Valid encounter within last 12 months    Recent Outpatient Visits           1 month ago SVT (supraventricular tachycardia)   [place] [place], Melanie T, NP   8 months ago SVT (supraventricular tachycardia)   [place] [facility] [place], Melanie T, NP   8 months ago Flu-like symptoms   [facility] Leavy Mole, PA-C

## 2023-11-29 NOTE — Telephone Encounter (Signed)
 Discontinued on 01/27/23. Will refuse this request due to this.  Requested Prescriptions  Pending Prescriptions Disp Refills   amoxicillin -clavulanate (AUGMENTIN ) 875-125 MG tablet [Pharmacy Med Name: AMOXICILLIN -CLAV 875-125MG  TAB] 14 tablet 0    Sig: TAKE 1 TABLET BY MOUTH EVERY 12 HOURS     Off-Protocol Failed - 11/29/2023  5:21 PM      Failed - Medication not assigned to a protocol, review manually.      Passed - Valid encounter within last 12 months    Recent Outpatient Visits           1 month ago SVT (supraventricular tachycardia)   [place] [facility] [place], Lonoke T, NP   8 months ago SVT (supraventricular tachycardia)   [place] [facility] [place], Melanie T, NP   8 months ago Flu-like symptoms   [facility] Leavy Mole, PA-C

## 2023-11-29 NOTE — Telephone Encounter (Signed)
 Medications not on current list, will refuse this request.  Requested Prescriptions  Refused Prescriptions Disp Refills   promethazine -dextromethorphan  (PROMETHAZINE -DM) 6.25-15 MG/5ML syrup [Pharmacy Med Name: PROMETHAZINE -DM 6.25-15 MG/5ML] 118 mL 0    Sig: TAKE 2.5-5 MLS BY MOUTH 4 (FOUR) TIMES DAILY AS NEEDED FOR COUGH.     Ear, Nose, and Throat:  Antitussives/Expectorants Passed - 11/29/2023  5:14 PM      Passed - Valid encounter within last 12 months    Recent Outpatient Visits           1 month ago SVT (supraventricular tachycardia)   [place] [facility] [place], Melanie T, NP   8 months ago SVT (supraventricular tachycardia)   [place] [facility] [place], Edna T, NP   8 months ago Flu-like symptoms   [place] [facility] [place], Leisa, PA-C               oseltamivir  (TAMIFLU ) 75 MG capsule [Pharmacy Med Name: OSELTAMIVIR  PHOS 75 MG CAPSULE] 10 capsule 0    Sig: TAKE 1 CAPSULE BY MOUTH TWICE A DAY FOR 5 DAYS     Off-Protocol Failed - 11/29/2023  5:14 PM      Failed - Medication not assigned to a protocol, review manually.      Passed - Valid encounter within last 12 months    Recent Outpatient Visits           1 month ago SVT (supraventricular tachycardia)   [place] [facility] [place], [place] T, NP   8 months ago SVT (supraventricular tachycardia)   [place] [facility] [place], Melanie T, NP   8 months ago Flu-like symptoms   [facility] Leavy Mole, PA-C

## 2023-11-30 ENCOUNTER — Encounter: Payer: Self-pay | Admitting: Nurse Practitioner

## 2023-11-30 ENCOUNTER — Ambulatory Visit (INDEPENDENT_AMBULATORY_CARE_PROVIDER_SITE_OTHER): Admitting: Nurse Practitioner

## 2023-11-30 VITALS — BP 116/74 | HR 66 | Temp 97.6°F | Ht 63.0 in | Wt 300.6 lb

## 2023-11-30 DIAGNOSIS — I10 Essential (primary) hypertension: Secondary | ICD-10-CM

## 2023-11-30 MED ORDER — LOSARTAN POTASSIUM 50 MG PO TABS: 50.0000 mg | ORAL_TABLET | Freq: Every day | ORAL | 3 refills | Status: AC

## 2023-11-30 NOTE — Patient Instructions (Signed)
 Orthostatic Hypotension Blood pressure is a measurement of how strongly, or weakly, your circulating blood is pressing against the walls of your arteries. Orthostatic hypotension is a drop in blood pressure that can happen when you change positions, such as when you go from lying down to standing. Arteries are blood vessels that carry blood from your heart throughout your body. When blood pressure is too low, you may not get enough blood to your brain or to the rest of your organs. Orthostatic hypotension can cause light-headedness, sweating, rapid heartbeat, blurred vision, and fainting. These symptoms require further investigation into the cause. What are the causes? Orthostatic hypotension can be caused by many things, including: Sudden changes in posture, such as standing up quickly after you have been sitting or lying down. Loss of blood (anemia) or loss of body fluids (dehydration). Heart problems, neurologic problems, or hormone problems. Pregnancy. Aging. The risk for this condition increases as you get older. Severe infection (sepsis). Certain medicines, such as medicines for high blood pressure or medicines that make the body lose excess fluids (diuretics). What are the signs or symptoms? Symptoms of this condition may include: Weakness, light-headedness, or dizziness. Sweating. Blurred vision. Tiredness (fatigue). Rapid heartbeat. Fainting, in severe cases. How is this diagnosed? This condition is diagnosed based on: Your symptoms and medical history. Your blood pressure measurements. Your health care provider will check your blood pressure when you are: Lying down. Sitting. Standing. A blood pressure reading is recorded as two numbers, such as "120 over 80" (or 120/80). The first ("top") number is called the systolic pressure. It is a measure of the pressure in your arteries as your heart beats. The second ("bottom") number is called the diastolic pressure. It is a measure of  the pressure in your arteries when your heart relaxes between beats. Blood pressure is measured in a unit called mmHg. Healthy blood pressure for most adults is 120/80 mmHg. Orthostatic hypotension is defined as a 20 mmHg drop in systolic pressure or a 10 mmHg drop in diastolic pressure within 3 minutes of standing. Other information or tests that may be used to diagnose orthostatic hypotension include: Your other vital signs, such as your heart rate and temperature. Blood tests. An electrocardiogram (ECG) or echocardiogram. A Holter monitor. This is a device you wear that records your heart rhythm continuously, usually for 24-48 hours. Tilt table test. For this test, you will be safely secured to a table that moves you from a lying position to an upright position. Your heart rhythm and blood pressure will be monitored during the test. How is this treated? This condition may be treated by: Changing your diet. This may involve eating more salt (sodium) or drinking more water. Changing the dosage of certain medicines you are taking that might be lowering your blood pressure. Correcting the underlying reason for the orthostatic hypotension. Wearing compression stockings. Taking medicines to raise your blood pressure. Avoiding actions that trigger symptoms. Follow these instructions at home: Medicines Take over-the-counter and prescription medicines only as told by your health care provider. Follow instructions from your health care provider about changing the dosage of your current medicines, if this applies. Do not stop or adjust any of your medicines on your own. Eating and drinking  Drink enough fluid to keep your urine pale yellow. Eat extra salt only as directed. Do not add extra salt to your diet unless advised by your health care provider. Eat frequent, small meals. Avoid standing up suddenly after eating. General instructions  Get up slowly from lying down or sitting positions. This  gives your blood pressure a chance to adjust. Avoid hot showers and excessive heat as directed by your health care provider. Engage in regular physical activity as directed by your health care provider. If you have compression stockings, wear them as told. Keep all follow-up visits. This is important. Contact a health care provider if: You have a fever for more than 2-3 days. You feel more thirsty than usual. You feel dizzy or weak. Get help right away if: You have chest pain. You have a fast or irregular heartbeat. You become sweaty or feel light-headed. You feel short of breath. You faint. You have any symptoms of a stroke. "BE FAST" is an easy way to remember the main warning signs of a stroke: B - Balance. Signs are dizziness, sudden trouble walking, or loss of balance. E - Eyes. Signs are trouble seeing or a sudden change in vision. F - Face. Signs are sudden weakness or numbness of the face, or the face or eyelid drooping on one side. A - Arms. Signs are weakness or numbness in an arm. This happens suddenly and usually on one side of the body. S - Speech. Signs are sudden trouble speaking, slurred speech, or trouble understanding what people say. T - Time. Time to call emergency services. Write down what time symptoms started. You have other signs of a stroke, such as: A sudden, severe headache with no known cause. Nausea or vomiting. Seizure. These symptoms may represent a serious problem that is an emergency. Do not wait to see if the symptoms will go away. Get medical help right away. Call your local emergency services (911 in the [place] Do not drive yourself to the hospital. Summary Orthostatic hypotension is a sudden drop in blood pressure. It can cause light-headedness, sweating, rapid heartbeat, blurred vision, and fainting. Orthostatic hypotension can be diagnosed by having your blood pressure taken while lying down, sitting, and then standing. Treatment may involve  changing your diet, wearing compression stockings, sitting up slowly, adjusting your medicines, or correcting the underlying reason for the orthostatic hypotension. Get help right away if you have chest pain, a fast or irregular heartbeat, or symptoms of a stroke. This information is not intended to replace advice given to you by your health care provider. Make sure you discuss any questions you have with your health care provider. Document Revised: 04/02/2020 Document Reviewed: 04/02/2020 Elsevier Patient Education  2024 [employer].

## 2023-11-30 NOTE — Assessment & Plan Note (Signed)
 Chronic, stable.  BP well below goal in office and having dizzy episodes a couple hours after taking medication at home.  Recommend she monitor BP at home regularly and document.  Will trial reduction of Losartan  to 50 MG daily and continue Metoprolol  and Lasix  20 MG for edema.  Would avoid Amlodipine  due to baseline edema to BLE with lymphedema. Focus on DASH diet at home.  Urine ALB 11 October 2023.   Recommend: - Reminded to call for an overnight weight gain of >2 pounds or a weekly weight weight of >5 pounds - not adding salt to his food and has been reading food labels. Reviewed the importance of keeping daily sodium intake to 2000mg  daily

## 2023-11-30 NOTE — Progress Notes (Signed)
 BP 116/74   Pulse 66   Temp 97.6 F (36.4 C) (Oral)   Ht 5' 3 (1.6 m)   Wt (!) 300 lb 9.6 oz (136.4 kg)   LMP  (LMP Unknown)   SpO2 96%   BMI 53.25 kg/m    Subjective:    Patient ID: Jasmine Webb, female    DOB: 05-23-1955, 68 y.o.   MRN: [id number]  HPI: Jasmine Webb is a 68 y.o. female  Chief Complaint  Patient presents with   Hypertension    Patient states she has been getting low blood pressure readings and feeling some dizziness. States she was told to let us  know if this happened and we would adjust her medications.    Dizziness   HYPERTENSION without Chronic Kidney Disease Taking Losartan , Metoprolol , and Lasix  PRN. Taking potassium for low K+ at 3.4. Has been noticing some dizziness with lower levels of BP at home.  Varies as to when it comes, does not matter position or time of day.  Dizziness will last for less than a minute and go away.  No other symptoms with this.  Notices this dizziness a couple hours after taking BP medication.   Hypertension status: controlled  Satisfied with current treatment? yes Duration of hypertension: chronic BP monitoring frequency:  daily BP range: 110/60-70 range BP medication side effects:  no Medication compliance: good compliance Aspirin : no Recurrent headaches: no Visual changes: no Palpitations: no Dyspnea: no Chest pain: no Lower extremity edema: at baseline  Dizzy/lightheaded: yes   Relevant past medical, surgical, family and social history reviewed and updated as indicated. Interim medical history since our last visit reviewed. Allergies and medications reviewed and updated.  Review of Systems  Constitutional:  Negative for activity change, appetite change, diaphoresis, fatigue and fever.  Respiratory:  Negative for cough, chest tightness and shortness of breath.   Cardiovascular:  Negative for chest pain, palpitations and leg swelling.  Gastrointestinal: Negative.   Endocrine: Negative for polydipsia, polyphagia  and polyuria.  Neurological:  Positive for dizziness. Negative for facial asymmetry, speech difficulty, weakness, light-headedness, numbness and headaches.  Psychiatric/Behavioral: Negative.      Per HPI unless specifically indicated above     Objective:    BP 116/74   Pulse 66   Temp 97.6 F (36.4 C) (Oral)   Ht 5' 3 (1.6 m)   Wt (!) 300 lb 9.6 oz (136.4 kg)   LMP  (LMP Unknown)   SpO2 96%   BMI 53.25 kg/m   Wt Readings from Last 3 Encounters:  11/30/23 (!) 300 lb 9.6 oz (136.4 kg)  10/05/23 291 lb (132 kg)  04/25/23 (!) 304 lb (137.9 kg)    Physical Exam Vitals and nursing note reviewed.  Constitutional:      General: She is awake. She is not in acute distress.    Appearance: She is well-developed and well-groomed. She is obese. She is not ill-appearing or toxic-appearing.  HENT:     Head: Normocephalic.     Right Ear: Hearing and external ear normal.     Left Ear: Hearing and external ear normal.  Eyes:     General: Lids are normal.        Right eye: No discharge.        Left eye: No discharge.     Conjunctiva/sclera: Conjunctivae normal.     Pupils: Pupils are equal, round, and reactive to light.  Neck:     Thyroid : No thyromegaly.     Vascular: No  carotid bruit.  Cardiovascular:     Rate and Rhythm: Normal rate and regular rhythm.     Heart sounds: Normal heart sounds. No murmur heard.    No gallop.  Pulmonary:     Effort: Pulmonary effort is normal. No accessory muscle usage or respiratory distress.     Breath sounds: Normal breath sounds.  Abdominal:     General: Bowel sounds are normal. There is no distension.     Palpations: Abdomen is soft.     Tenderness: There is no abdominal tenderness.  Musculoskeletal:     Cervical back: Normal range of motion and neck supple.     Right lower leg: No edema.     Left lower leg: No edema.  Lymphadenopathy:     Cervical: No cervical adenopathy.  Skin:    General: Skin is warm and dry.  Neurological:      Mental Status: She is alert and oriented to person, place, and time.     Cranial Nerves: Cranial nerves 2-12 are intact.     Coordination: Coordination is intact.     Gait: Gait is intact.     Deep Tendon Reflexes: Reflexes are normal and symmetric.     Reflex Scores:      Brachioradialis reflexes are 2+ on the right side and 2+ on the left side.      Patellar reflexes are 2+ on the right side and 2+ on the left side. Psychiatric:        Attention and Perception: Attention normal.        Mood and Affect: Mood normal.        Speech: Speech normal.        Behavior: Behavior normal. Behavior is cooperative.        Thought Content: Thought content normal.     Results for orders placed or performed in visit on 11/01/23  Sodium   Collection Time: 11/01/23  8:33 AM  Result Value Ref Range   Sodium 142 134 - 144 mmol/L  Potassium   Collection Time: 11/01/23  8:33 AM  Result Value Ref Range   Potassium 3.4 (L) 3.5 - 5.2 mmol/L      Assessment & Plan:   Problem List Items Addressed This Visit       Cardiovascular and Mediastinum   Essential hypertension - Primary   Chronic, stable.  BP well below goal in office and having dizzy episodes a couple hours after taking medication at home.  Recommend she monitor BP at home regularly and document.  Will trial reduction of Losartan  to 50 MG daily and continue Metoprolol  and Lasix  20 MG for edema.  Would avoid Amlodipine  due to baseline edema to BLE with lymphedema. Focus on DASH diet at home.  Urine ALB 11 October 2023.   Recommend: - Reminded to call for an overnight weight gain of >2 pounds or a weekly weight weight of >5 pounds - not adding salt to his food and has been reading food labels. Reviewed the importance of keeping daily sodium intake to 2000mg  daily       Relevant Medications   losartan  (COZAAR ) 50 MG tablet     Follow up plan: Return in about 4 weeks (around 12/28/2023) for Orthostatic BP -- lowered Losartan .

## 2023-12-02 ENCOUNTER — Other Ambulatory Visit: Payer: Self-pay | Admitting: Nurse Practitioner

## 2023-12-04 NOTE — Telephone Encounter (Signed)
 Requested Prescriptions  Pending Prescriptions Disp Refills   ELIQUIS  5 MG TABS tablet [Pharmacy Med Name: ELIQUIS  5 MG TABLET] 180 tablet 0    Sig: TAKE 1 TABLET BY MOUTH TWICE A DAY     Hematology:  Anticoagulants - apixaban  Passed - 12/04/2023  2:08 PM      Passed - PLT in normal range and within 360 days    Platelets  Date Value Ref Range Status  10/05/2023 184 150 - 450 x10E3/uL Final         Passed - HGB in normal range and within 360 days    Hemoglobin  Date Value Ref Range Status  10/05/2023 13.7 11.1 - 15.9 g/dL Final         Passed - HCT in normal range and within 360 days    Hematocrit  Date Value Ref Range Status  10/05/2023 43.5 34.0 - 46.6 % Final         Passed - Cr in normal range and within 360 days    Creatinine, Ser  Date Value Ref Range Status  10/05/2023 0.75 0.57 - 1.00 mg/dL Final         Passed - AST in normal range and within 360 days    AST  Date Value Ref Range Status  10/05/2023 17 0 - 40 IU/L Final         Passed - ALT in normal range and within 360 days    ALT  Date Value Ref Range Status  10/05/2023 15 0 - 32 IU/L Final         Passed - Valid encounter within last 12 months    Recent Outpatient Visits           4 days ago Essential hypertension   [facility] [place], [place] T, NP   2 months ago SVT (supraventricular tachycardia)   [place] [facility] [place], Melanie T, NP   8 months ago SVT (supraventricular tachycardia)   [place] [facility] [place], Melanie T, NP   9 months ago Flu-like symptoms   [facility] Leavy Mole, PA-C

## 2023-12-06 ENCOUNTER — Other Ambulatory Visit

## 2023-12-06 DIAGNOSIS — E876 Hypokalemia: Secondary | ICD-10-CM

## 2023-12-07 ENCOUNTER — Ambulatory Visit: Payer: Self-pay | Admitting: Nurse Practitioner

## 2023-12-07 LAB — POTASSIUM: Potassium: 3.6 mmol/L (ref 3.5–5.2)

## 2023-12-07 NOTE — Progress Notes (Signed)
 Please let [name] know potassium has improved. No further intervention at this time.

## 2023-12-08 ENCOUNTER — Ambulatory Visit: Payer: Self-pay | Admitting: *Deleted

## 2023-12-08 ENCOUNTER — Telehealth (INDEPENDENT_AMBULATORY_CARE_PROVIDER_SITE_OTHER): Admitting: Nurse Practitioner

## 2023-12-08 ENCOUNTER — Encounter: Payer: Self-pay | Admitting: Nurse Practitioner

## 2023-12-08 DIAGNOSIS — H6501 Acute serous otitis media, right ear: Secondary | ICD-10-CM | POA: Diagnosis not present

## 2023-12-08 MED ORDER — AMOXICILLIN-POT CLAVULANATE 875-125 MG PO TABS: 1.0000 | ORAL_TABLET | Freq: Two times a day (BID) | ORAL | 0 refills | Status: AC

## 2023-12-08 MED ORDER — PREDNISONE 20 MG PO TABS: 40.0000 mg | ORAL_TABLET | Freq: Every day | ORAL | 0 refills | Status: AC

## 2023-12-08 NOTE — Patient Instructions (Signed)

## 2023-12-08 NOTE — Assessment & Plan Note (Signed)
 Acute for 24 hours and now going into sinuses - she denies any drainage from ear.  Start Augmentin  BID for 7 days and Prednisone  40 MG daily for 5 days. Recommend no swimming pools or Q tips. Ensure plenty of rest and hydration.  If ongoing or worsening then to come into office or go to UC for visit.

## 2023-12-08 NOTE — Telephone Encounter (Signed)
 Please see Jolene's message below and reach out to the patient.

## 2023-12-08 NOTE — Progress Notes (Signed)
 LMP  (LMP Unknown)    Subjective:    Patient ID: Jasmine Webb, female    DOB: [birth year], 68 y.o.   MRN: [id number]  HPI: Jasmine Webb is a 68 y.o. female  Chief Complaint  Patient presents with   Ear Pain    Patient states she has been having pain in her R ear since last visit   Dizziness   Virtual Visit via Video Note  I connected with Taniya Dasher on 12/08/23 at  3:00 PM EST by a video enabled telemedicine application and verified that I am speaking with the correct person using two identifiers.  Location: Patient: home Provider: work   I discussed the limitations of evaluation and management by telemedicine and the availability of in person appointments. The patient expressed understanding and agreed to proceed.  I discussed the assessment and treatment plan with the patient. The patient was provided an opportunity to ask questions and all were answered. The patient agreed with the plan and demonstrated an understanding of the instructions.   The patient was advised to call back or seek an in-person evaluation if the symptoms worsen or if the condition fails to improve as anticipated.  I provided 25 minutes of non-face-to-face time during this encounter.   Beverly Suriano T Raylene Carmickle, NP   EAR PAIN (RIGHT) Started yesterday and has gotten worse, now in sinuses. Duration: days Involved ear(s): right Severity:  7/10  Quality:  sharp, dull, aching, and throbbing Fever: yes Otorrhea: no Upper respiratory infection symptoms: yes Pruritus: yes Hearing loss: yes Water  immersion no Using Q-tips: yes Recurrent otitis media: no Status: worse Treatments attempted: Tylenol   Relevant past medical, surgical, family and social history reviewed and updated as indicated. Interim medical history since our last visit reviewed. Allergies and medications reviewed and updated.  Review of Systems  Constitutional:  Positive for fatigue and fever. Negative for activity change, appetite change  and chills.  HENT:  Positive for congestion, ear pain, rhinorrhea, sinus pressure and sinus pain. Negative for ear discharge and sore throat.   Respiratory:  Negative for cough, chest tightness, shortness of breath and wheezing.   Cardiovascular:  Negative for chest pain, palpitations and leg swelling.  Gastrointestinal: Negative.   Neurological:  Positive for dizziness and headaches.  Psychiatric/Behavioral: Negative.      Per HPI unless specifically indicated above     Objective:    LMP  (LMP Unknown)   Wt Readings from Last 3 Encounters:  11/30/23 (!) 300 lb 9.6 oz (136.4 kg)  10/05/23 291 lb (132 kg)  04/25/23 (!) 304 lb (137.9 kg)    Physical Exam Vitals and nursing note reviewed.  Constitutional:      General: She is awake. She is not in acute distress.    Appearance: She is well-developed and well-groomed. She is obese. She is not ill-appearing.  HENT:     Head: Normocephalic.     Right Ear: Hearing normal.     Left Ear: Hearing normal.  Eyes:     General: Lids are normal.        Right eye: No discharge.        Left eye: No discharge.     Conjunctiva/sclera: Conjunctivae normal.  Pulmonary:     Effort: Pulmonary effort is normal. No accessory muscle usage or respiratory distress.  Musculoskeletal:     Cervical back: Normal range of motion.  Neurological:     Mental Status: She is alert and oriented to person, place, and time.  Psychiatric:  Attention and Perception: Attention normal.        Mood and Affect: Mood normal.        Behavior: Behavior normal. Behavior is cooperative.        Thought Content: Thought content normal.        Judgment: Judgment normal.     Results for orders placed or performed in visit on 12/06/23  Potassium   Collection Time: 12/06/23  8:49 AM  Result Value Ref Range   Potassium 3.6 3.5 - 5.2 mmol/L      Assessment & Plan:   Problem List Items Addressed This Visit       Nervous and Auditory   Non-recurrent acute  serous otitis media of right ear - Primary   Acute for 24 hours and now going into sinuses - she denies any drainage from ear.  Start Augmentin  BID for 7 days and Prednisone  40 MG daily for 5 days. Recommend no swimming pools or Q tips. Ensure plenty of rest and hydration.  If ongoing or worsening then to come into office or go to UC for visit.      Relevant Medications   amoxicillin -clavulanate (AUGMENTIN ) 875-125 MG tablet     Follow up plan: Return for as scheduled December 11th.

## 2023-12-08 NOTE — Telephone Encounter (Signed)
 FYI Only or Action Required?: Action required by provider: medication request.  Patient was last seen in primary care on 11/30/2023 by Valerio Melanie DASEN, NP.  Called Nurse Triage reporting Dizziness (Vertigo, ear pain).  Symptoms began yesterday.  Interventions attempted: Rest, hydration, or home remedies.  Symptoms are: gradually worsening.  Triage Disposition: See HCP Within 4 Hours (Or PCP Triage)  Patient/caregiver understands and will follow disposition?: No, refuses disposition  Copied from CRM #[id number]. Topic: Clinical - Red Word Triage >> Dec 08, 2023  9:23 AM Roselie BROCKS wrote: Red Word that prompted transfer to Nurse Triage: Patient states she is having severe vertigo, extreme pain and ache in right ear, not able to walk. Reason for Disposition  Walking is very unsteady or feels very dizzy  Answer Assessment - Initial Assessment Questions Patient is requesting treatment for ear/sinus- she states she is unable to come to the office due to the dizziness- she does not want to fall- she is heavy and she would get hurt.   1. LOCATION: Which ear is involved?     R ear 2. ONSET: When did the ear pain start?      Yesterday afternoon 3. SEVERITY: How bad is the pain?  (Scale 1-10; mild, moderate or severe)     severe 4. URI SYMPTOMS: Do you have a runny nose or cough?     Dizzy, sinus drainage 5. FEVER: Do you have a fever? If Yes, ask: What is your temperature, how was it measured, and when did it start?     Low grade 6. CAUSE: Have you been swimming recently?, How often do you use Q-TIPS?, Have you had any recent air travel or scuba diving?     no 7. OTHER SYMPTOMS: Do you have any other symptoms? (e.g., decreased hearing, dizziness, headache, stiff neck, vomiting)     Dizziness, vertigo- extreme  Protocols used: Earache-A-AH

## 2023-12-08 NOTE — Telephone Encounter (Signed)
 Noted

## 2023-12-11 ENCOUNTER — Other Ambulatory Visit

## 2023-12-24 ENCOUNTER — Other Ambulatory Visit: Payer: Self-pay | Admitting: Nurse Practitioner

## 2023-12-25 NOTE — Telephone Encounter (Signed)
 Requested Prescriptions  Pending Prescriptions Disp Refills   metoprolol  tartrate (LOPRESSOR ) 25 MG tablet [Pharmacy Med Name: METOPROLOL  TARTRATE 25 MG TAB] 180 tablet 0    Sig: TAKE 1 TABLET BY MOUTH TWICE A DAY     Cardiovascular:  Beta Blockers Passed - 12/25/2023  3:54 PM      Passed - Last BP in normal range    BP Readings from Last 1 Encounters:  11/30/23 116/74         Passed - Last Heart Rate in normal range    Pulse Readings from Last 1 Encounters:  11/30/23 66         Passed - Valid encounter within last 6 months    Recent Outpatient Visits           2 weeks ago Non-recurrent acute serous otitis media of right ear   [place] [facility] [place], Melanie T, NP   3 weeks ago Essential hypertension   [place] [facility] [place], [place] T, NP   2 months ago SVT (supraventricular tachycardia)   [place] [facility] [place], Melanie T, NP   9 months ago SVT (supraventricular tachycardia)   [place] [facility] [place], Melanie T, NP   9 months ago Flu-like symptoms   [place] [facility] Leavy Mole, PA-C

## 2023-12-30 NOTE — Patient Instructions (Addendum)
 Okay to take Coricidin if needed.   Be Involved in Caring For Your Health:  Taking Medications When medications are taken as directed, they can greatly improve your health. But if they are not taken as prescribed, they may not work. In some cases, not taking them correctly can be harmful. To help ensure your treatment remains effective and safe, understand your medications and how to take them. Bring your medications to each visit for review by your provider.  Your lab results, notes, and after visit summary will be available on My Chart. We strongly encourage you to use this feature. If lab results are abnormal the clinic will contact you with the appropriate steps. If the clinic does not contact you assume the results are satisfactory. You can always view your results on My Chart. If you have questions regarding your health or results, please contact the clinic during office hours. You can also ask questions on My Chart.  We at [facility] are grateful that you chose us  to provide your care. We strive to provide evidence-based and compassionate care and are always looking for feedback. If you get a survey from the clinic please complete this so we can hear your opinions.   Orthostatic Hypotension Blood pressure is a measurement of how strongly, or weakly, your circulating blood is pressing against the walls of your arteries. Orthostatic hypotension is a drop in blood pressure that can happen when you change positions, such as when you go from lying down to standing. Arteries are blood vessels that carry blood from your heart throughout your body. When blood pressure is too low, you may not get enough blood to your brain or to the rest of your organs. Orthostatic hypotension can cause light-headedness, sweating, rapid heartbeat, blurred vision, and fainting. These symptoms require further investigation into the cause. What are the causes? Orthostatic hypotension can be caused by many  things, including: Sudden changes in posture, such as standing up quickly after you have been sitting or lying down. Loss of blood (anemia) or loss of body fluids (dehydration). Heart problems, neurologic problems, or hormone problems. Pregnancy. Aging. The risk for this condition increases as you get older. Severe infection (sepsis). Certain medicines, such as medicines for high blood pressure or medicines that make the body lose excess fluids (diuretics). What are the signs or symptoms? Symptoms of this condition may include: Weakness, light-headedness, or dizziness. Sweating. Blurred vision. Tiredness (fatigue). Rapid heartbeat. Fainting, in severe cases. How is this diagnosed? This condition is diagnosed based on: Your symptoms and medical history. Your blood pressure measurements. Your health care provider will check your blood pressure when you are: Lying down. Sitting. Standing. A blood pressure reading is recorded as two numbers, such as 120 over 80 (or 120/80). The first (top) number is called the systolic pressure. It is a measure of the pressure in your arteries as your heart beats. The second (bottom) number is called the diastolic pressure. It is a measure of the pressure in your arteries when your heart relaxes between beats. Blood pressure is measured in a unit called mmHg. Healthy blood pressure for most adults is 120/80 mmHg. Orthostatic hypotension is defined as a 20 mmHg drop in systolic pressure or a 10 mmHg drop in diastolic pressure within 3 minutes of standing. Other information or tests that may be used to diagnose orthostatic hypotension include: Your other vital signs, such as your heart rate and temperature. Blood tests. An electrocardiogram (ECG) or echocardiogram. A Holter monitor. This  is a device you wear that records your heart rhythm continuously, usually for 24-48 hours. Tilt table test. For this test, you will be safely secured to a table that  moves you from a lying position to an upright position. Your heart rhythm and blood pressure will be monitored during the test. How is this treated? This condition may be treated by: Changing your diet. This may involve eating more salt (sodium) or drinking more water . Changing the dosage of certain medicines you are taking that might be lowering your blood pressure. Correcting the underlying reason for the orthostatic hypotension. Wearing compression stockings. Taking medicines to raise your blood pressure. Avoiding actions that trigger symptoms. Follow these instructions at home: Medicines Take over-the-counter and prescription medicines only as told by your health care provider. Follow instructions from your health care provider about changing the dosage of your current medicines, if this applies. Do not stop or adjust any of your medicines on your own. Eating and drinking  Drink enough fluid to keep your urine pale yellow. Eat extra salt only as directed. Do not add extra salt to your diet unless advised by your health care provider. Eat frequent, small meals. Avoid standing up suddenly after eating. General instructions  Get up slowly from lying down or sitting positions. This gives your blood pressure a chance to adjust. Avoid hot showers and excessive heat as directed by your health care provider. Engage in regular physical activity as directed by your health care provider. If you have compression stockings, wear them as told. Keep all follow-up visits. This is important. Contact a health care provider if: You have a fever for more than 2-3 days. You feel more thirsty than usual. You feel dizzy or weak. Get help right away if: You have chest pain. You have a fast or irregular heartbeat. You become sweaty or feel light-headed. You feel short of breath. You faint. You have any symptoms of a stroke. BE FAST is an easy way to remember the main warning signs of a stroke: B -  Balance. Signs are dizziness, sudden trouble walking, or loss of balance. E - Eyes. Signs are trouble seeing or a sudden change in vision. F - Face. Signs are sudden weakness or numbness of the face, or the face or eyelid drooping on one side. A - Arms. Signs are weakness or numbness in an arm. This happens suddenly and usually on one side of the body. S - Speech. Signs are sudden trouble speaking, slurred speech, or trouble understanding what people say. T - Time. Time to call emergency services. Write down what time symptoms started. You have other signs of a stroke, such as: A sudden, severe headache with no known cause. Nausea or vomiting. Seizure. These symptoms may represent a serious problem that is an emergency. Do not wait to see if the symptoms will go away. Get medical help right away. Call your local emergency services (911 in the [place] Do not drive yourself to the hospital. Summary Orthostatic hypotension is a sudden drop in blood pressure. It can cause light-headedness, sweating, rapid heartbeat, blurred vision, and fainting. Orthostatic hypotension can be diagnosed by having your blood pressure taken while lying down, sitting, and then standing. Treatment may involve changing your diet, wearing compression stockings, sitting up slowly, adjusting your medicines, or correcting the underlying reason for the orthostatic hypotension. Get help right away if you have chest pain, a fast or irregular heartbeat, or symptoms of a stroke. This information is not intended to  replace advice given to you by your health care provider. Make sure you discuss any questions you have with your health care provider. Document Revised: 04/02/2020 Document Reviewed: 04/02/2020 Elsevier Patient Education  2024 [employer].

## 2024-01-11 ENCOUNTER — Ambulatory Visit: Admitting: Nurse Practitioner

## 2024-01-11 ENCOUNTER — Encounter: Payer: Self-pay | Admitting: Nurse Practitioner

## 2024-01-11 DIAGNOSIS — I10 Essential (primary) hypertension: Secondary | ICD-10-CM

## 2024-01-11 DIAGNOSIS — R0981 Nasal congestion: Secondary | ICD-10-CM

## 2024-01-11 DIAGNOSIS — Z6841 Body Mass Index (BMI) 40.0 and over, adult: Secondary | ICD-10-CM

## 2024-01-11 DIAGNOSIS — J069 Acute upper respiratory infection, unspecified: Secondary | ICD-10-CM | POA: Diagnosis not present

## 2024-01-11 LAB — POC COVID19/FLU A&B COMBO
Covid Antigen, POC: NEGATIVE
Influenza A Antigen, POC: NEGATIVE
Influenza B Antigen, POC: NEGATIVE

## 2024-01-11 MED ORDER — PREDNISONE 20 MG PO TABS: 40.0000 mg | ORAL_TABLET | Freq: Every day | ORAL | 0 refills | Status: AC

## 2024-01-11 NOTE — Assessment & Plan Note (Addendum)
 Acute with symptoms for 3-4 days. Covid and flu testing negative. Will send in Prednisone  40 MG daily for 5 days which has offered her benefit in past with symptom management. No abx at this time due to length of time has been sick, she is aware to reach out next week if symptoms ongoing past day 7 - then will send in abx therapy. Educated her at length on this. Recommend: - Increased rest - Increasing Fluids - Acetaminophen  as needed for fever/pain.  - Salt water  gargling, chloraseptic spray and throat lozenges - OTC Xyzal  and Flonase  - continue these at home - Mucinex  or Coricidin as needed. - Saline sinus flushes or a neti pot.  - Humidifying the air.

## 2024-01-11 NOTE — Assessment & Plan Note (Signed)
 BMI 52.26.  Recommended eating smaller high protein, low fat meals more frequently and exercising 30 mins a day 5 times a week with a goal of 10-15lb weight loss in the next 3 months. Patient voiced their understanding and motivation to adhere to these recommendations.

## 2024-01-11 NOTE — Assessment & Plan Note (Signed)
 Recommended eating smaller high protein, low fat meals more frequently and exercising 30 mins a day 5 times a week with a goal of 10-15lb weight loss in the next 3 months. Patient voiced their understanding and motivation to adhere to these recommendations.

## 2024-01-11 NOTE — Assessment & Plan Note (Signed)
 Chronic, stable.  BP well below goal in office. Dizziness now improved with medication reduction.  Recommend she monitor BP at home regularly and document.  Continue Losartan  50 MG daily, Metoprolol , and Lasix  20 MG for edema.  Would avoid Amlodipine  due to baseline edema to BLE with lymphedema. Focus on DASH diet at home.  Urine ALB 11 October 2023.   Recommend: - Reminded to call for an overnight weight gain of >2 pounds or a weekly weight weight of >5 pounds - not adding salt to his food and has been reading food labels. Reviewed the importance of keeping daily sodium intake to 2000mg  daily

## 2024-01-11 NOTE — Progress Notes (Signed)
 BP 107/67   Pulse 67   Temp 98.5 F (36.9 C) (Oral)   Ht 5' 3 (1.6 m)   Wt 295 lb (133.8 kg)   LMP  (LMP Unknown)   SpO2 91%   BMI 52.26 kg/m    Subjective:    Patient ID: Jasmine Webb, female    DOB: Feb 09, 1955, 68 y.o.   MRN: [id number]  HPI: Karesha Trzcinski is a 68 y.o. female  Chief Complaint  Patient presents with   Hypertension   URI    Patient states she has been having a cough, congestion, runny nose, sinus pressure, scratchy throat, and wheezing for the last 3 to 4 days.    HYPERTENSION without Chronic Kidney Disease Follow-up today for hypotension, reduced her medication last visit. Taking Losartan  (reduced to 50 MG last visit), Metoprolol , and Lasix  PRN. No further dizziness and less tired with reduction of Losartan . Hypertension status: controlled  Satisfied with current treatment? yes Duration of hypertension: chronic BP monitoring frequency:  daily BP range: <130/80 on average BP medication side effects:  no Medication compliance: good compliance Aspirin : no Recurrent headaches: no Visual changes: no Palpitations: no Dyspnea: no Chest pain: no Lower extremity edema: at baseline, no worsening Dizzy/lightheaded: no  UPPER RESPIRATORY TRACT INFECTION Been feeling sick for 3-4 days, started with clear/runny nose. Now congested. Fever: low grade only Cough: yes very little Shortness of breath: very mild Wheezing: yes Chest pain: no Chest tightness: no Chest congestion: no Nasal congestion: yes Runny nose: yes Post nasal drip: yes Sneezing: no Sore throat: yes with drainage Swollen glands: no Sinus pressure: yes Headache: yes Face pain: no Toothache: no Ear pain: none Ear pressure: yes bilateral Eyes red/itching:no Eye drainage/crusting: no  Vomiting: no Rash: no Fatigue: yes Sick contacts: yes everyone in the house Strep contacts: no  Context: fluctuating Recurrent sinusitis: no Relief with OTC cold/cough medications: yes  Treatments  attempted: nothing    Relevant past medical, surgical, family and social history reviewed and updated as indicated. Interim medical history since our last visit reviewed. Allergies and medications reviewed and updated.  Review of Systems  Constitutional:  Positive for fatigue and fever (low grade per patient). Negative for activity change, appetite change and diaphoresis.  HENT:  Positive for congestion, postnasal drip, rhinorrhea, sinus pressure and sore throat. Negative for ear discharge, ear pain, facial swelling, sinus pain, sneezing and voice change.   Respiratory:  Positive for cough, shortness of breath (mild) and wheezing. Negative for chest tightness.   Cardiovascular:  Negative for chest pain, palpitations and leg swelling.  Gastrointestinal: Negative.   Endocrine: Negative.   Neurological:  Positive for headaches. Negative for dizziness and numbness.  Psychiatric/Behavioral: Negative.      Per HPI unless specifically indicated above     Objective:    BP 107/67   Pulse 67   Temp 98.5 F (36.9 C) (Oral)   Ht 5' 3 (1.6 m)   Wt 295 lb (133.8 kg)   LMP  (LMP Unknown)   SpO2 91%   BMI 52.26 kg/m   Wt Readings from Last 3 Encounters:  01/11/24 295 lb (133.8 kg)  11/30/23 (!) 300 lb 9.6 oz (136.4 kg)  10/05/23 291 lb (132 kg)    Physical Exam Vitals and nursing note reviewed.  Constitutional:      General: She is awake. She is not in acute distress.    Appearance: She is well-developed and well-groomed. She is obese. She is ill-appearing. She is not toxic-appearing.  HENT:     Head: Normocephalic.     Right Ear: Hearing, ear canal and external ear normal. A middle ear effusion is present. Tympanic membrane is not injected or perforated.     Left Ear: Hearing, ear canal and external ear normal. A middle ear effusion is present. Tympanic membrane is not injected or perforated.     Nose: Rhinorrhea present. Rhinorrhea is clear.     Right Sinus: No maxillary sinus  tenderness or frontal sinus tenderness.     Left Sinus: No maxillary sinus tenderness or frontal sinus tenderness.     Mouth/Throat:     Mouth: Mucous membranes are moist.     Pharynx: Posterior oropharyngeal erythema (mild) present. No pharyngeal swelling or oropharyngeal exudate.  Eyes:     General: Lids are normal.        Right eye: No discharge.        Left eye: No discharge.     Conjunctiva/sclera: Conjunctivae normal.     Pupils: Pupils are equal, round, and reactive to light.  Neck:     Thyroid : No thyromegaly.     Vascular: No carotid bruit.  Cardiovascular:     Rate and Rhythm: Normal rate and regular rhythm.     Heart sounds: Normal heart sounds. No murmur heard.    No gallop.  Pulmonary:     Effort: Pulmonary effort is normal. No accessory muscle usage or respiratory distress.     Breath sounds: Normal breath sounds. No decreased breath sounds, wheezing or rales.  Abdominal:     General: Bowel sounds are normal.     Palpations: Abdomen is soft. There is no hepatomegaly or splenomegaly.  Musculoskeletal:     Cervical back: Normal range of motion and neck supple.     Right lower leg: No edema.     Left lower leg: No edema.  Lymphadenopathy:     Head:     Right side of head: Submandibular and tonsillar adenopathy present. No submental, preauricular or posterior auricular adenopathy.     Left side of head: Submandibular and tonsillar adenopathy present. No submental, preauricular or posterior auricular adenopathy.     Cervical: No cervical adenopathy.  Skin:    General: Skin is warm and dry.  Neurological:     Mental Status: She is alert and oriented to person, place, and time.  Psychiatric:        Attention and Perception: Attention normal.        Mood and Affect: Mood normal.        Speech: Speech normal.        Behavior: Behavior normal. Behavior is cooperative.        Thought Content: Thought content normal.    Results for orders placed or performed in visit on  01/11/24  POC Covid19/Flu A&B Antigen   Collection Time: 01/11/24  8:37 AM  Result Value Ref Range   Influenza A Antigen, POC Negative Negative   Influenza B Antigen, POC Negative Negative   Covid Antigen, POC Negative Negative      Assessment & Plan:   Problem List Items Addressed This Visit       Cardiovascular and Mediastinum   Essential hypertension   Chronic, stable.  BP well below goal in office. Dizziness now improved with medication reduction.  Recommend she monitor BP at home regularly and document.  Continue Losartan  50 MG daily, Metoprolol , and Lasix  20 MG for edema.  Would avoid Amlodipine  due to baseline edema to BLE with  lymphedema. Focus on DASH diet at home.  Urine ALB 11 October 2023.   Recommend: - Reminded to call for an overnight weight gain of >2 pounds or a weekly weight weight of >5 pounds - not adding salt to his food and has been reading food labels. Reviewed the importance of keeping daily sodium intake to 2000mg  daily         Respiratory   Upper respiratory tract infection   Acute with symptoms for 3-4 days. Covid and flu testing negative. Will send in Prednisone  40 MG daily for 5 days which has offered her benefit in past with symptom management. No abx at this time due to length of time has been sick, she is aware to reach out next week if symptoms ongoing past day 7 - then will send in abx therapy. Educated her at length on this. Recommend: - Increased rest - Increasing Fluids - Acetaminophen  as needed for fever/pain.  - Salt water  gargling, chloraseptic spray and throat lozenges - OTC Xyzal  and Flonase  - continue these at home - Mucinex  or Coricidin as needed. - Saline sinus flushes or a neti pot.  - Humidifying the air.       Relevant Orders   POC Covid19/Flu A&B Antigen (Completed)     Other   Morbid obesity due to excess calories (HCC) - Primary   BMI 52.26.  Recommended eating smaller high protein, low fat meals more frequently and  exercising 30 mins a day 5 times a week with a goal of 10-15lb weight loss in the next 3 months. Patient voiced their understanding and motivation to adhere to these recommendations.       BMI 50.0-59.9, adult (HCC)   Recommended eating smaller high protein, low fat meals more frequently and exercising 30 mins a day 5 times a week with a goal of 10-15lb weight loss in the next 3 months. Patient voiced their understanding and motivation to adhere to these recommendations.         Follow up plan: Return for as scheduled March 4th.

## 2024-01-31 ENCOUNTER — Other Ambulatory Visit: Payer: Self-pay | Admitting: Nurse Practitioner

## 2024-02-01 ENCOUNTER — Other Ambulatory Visit: Payer: Self-pay | Admitting: Nurse Practitioner

## 2024-02-02 ENCOUNTER — Other Ambulatory Visit: Payer: Self-pay | Admitting: Nurse Practitioner

## 2024-02-02 NOTE — Telephone Encounter (Signed)
 Too soon for refill, last refill 10/02/23 for 90 and 1 refill.  Requested Prescriptions  Pending Prescriptions Disp Refills   levocetirizine (XYZAL ) 5 MG tablet [Pharmacy Med Name: LEVOCETIRIZINE 5 MG TABLET] 90 tablet 1    Sig: TAKE 1 TABLET BY MOUTH EVERY DAY IN THE EVENING     Ear, Nose, and Throat:  Antihistamines - levocetirizine dihydrochloride  Passed - 02/02/2024  9:45 AM      Passed - Cr in normal range and within 360 days    Creatinine, Ser  Date Value Ref Range Status  10/05/2023 0.75 0.57 - 1.00 mg/dL Final         Passed - eGFR is 10 or above and within 360 days    GFR calc Af Amer  Date Value Ref Range Status  01/07/2020 111 >59 mL/min/1.73 Final    Comment:    **In accordance with recommendations from the NKF-ASN Task force,**   [facility] is in the process of updating its eGFR calculation to the   2021 CKD-EPI creatinine equation that estimates kidney function   without a race variable.    GFR, Estimated  Date Value Ref Range Status  11/13/2020 >60 >60 mL/min Final    Comment:    (NOTE) Calculated using the CKD-EPI Creatinine Equation (2021)    eGFR  Date Value Ref Range Status  10/05/2023 86 >59 mL/min/1.73 Final         Passed - Valid encounter within last 12 months    Recent Outpatient Visits           3 weeks ago Morbid obesity due to excess calories (HCC)   [facility] [place], Melanie T, NP   1 month ago Non-recurrent acute serous otitis media of right ear   [place] [facility] [place], Melanie T, NP   2 months ago Essential hypertension   [facility] [place], [place] T, NP   4 months ago SVT (supraventricular tachycardia)    [facility] [place], Melanie T, NP   10 months ago SVT (supraventricular tachycardia)    [facility] [place], Melanie DASEN, NP

## 2024-02-02 NOTE — Telephone Encounter (Signed)
 Copied from CRM [id number]. Topic: Clinical - Medication Refill >> Feb 02, 2024 10:27 AM Winona R wrote: Medication:  levocetirizine (XYZAL ) 5 MG tablet [[id number]]  Has the patient contacted their pharmacy? No,  but I called the pharmacy states they pt should have enough as her last fill was 11/30   (Agent: If no, request that the patient contact the pharmacy for the refill. If patient does not wish to contact the pharmacy document the reason why and proceed with request.) (Agent: If yes, when and what did the pharmacy advise?)  This is the patient's preferred pharmacy:  [id number] [address] [facility] - [address] [place] [address] Phone: [phone] Fax: [phone]  Is this the correct pharmacy for this prescription? Yes If no, delete pharmacy and type the correct one.   Has the prescription been filled recently? Yes 11/30  Is the patient out of the medication? No- two more pills left   Has the patient been seen for an appointment in the last year OR does the patient have an upcoming appointment? Yes  Can we respond through MyChart? No   Agent: Please be advised that Rx refills may take up to 3 business days. We ask that you follow-up with your pharmacy.

## 2024-02-02 NOTE — Telephone Encounter (Signed)
 Too soon for refill.  Requested Prescriptions  Pending Prescriptions Disp Refills   levocetirizine (XYZAL ) 5 MG tablet 90 tablet 1    Sig: every evening.     Ear, Nose, and Throat:  Antihistamines - levocetirizine dihydrochloride  Passed - 02/02/2024  4:10 PM      Passed - Cr in normal range and within 360 days    Creatinine, Ser  Date Value Ref Range Status  10/05/2023 0.75 0.57 - 1.00 mg/dL Final         Passed - eGFR is 10 or above and within 360 days    GFR calc Af Amer  Date Value Ref Range Status  01/07/2020 111 >59 mL/min/1.73 Final    Comment:    **In accordance with recommendations from the NKF-ASN Task force,**   [facility] is in the process of updating its eGFR calculation to the   2021 CKD-EPI creatinine equation that estimates kidney function   without a race variable.    GFR, Estimated  Date Value Ref Range Status  11/13/2020 >60 >60 mL/min Final    Comment:    (NOTE) Calculated using the CKD-EPI Creatinine Equation (2021)    eGFR  Date Value Ref Range Status  10/05/2023 86 >59 mL/min/1.73 Final         Passed - Valid encounter within last 12 months    Recent Outpatient Visits           3 weeks ago Morbid obesity due to excess calories (HCC)   [place] [facility] [place], Melanie T, NP   1 month ago Non-recurrent acute serous otitis media of right ear   [place] [facility] [place], Melanie T, NP   2 months ago Essential hypertension   [place] [facility] [place], Anita T, NP   4 months ago SVT (supraventricular tachycardia)   [place] [facility] [place], Melanie T, NP   10 months ago SVT (supraventricular tachycardia)   [place] [facility] [place], Melanie DASEN, NP

## 2024-02-23 ENCOUNTER — Other Ambulatory Visit: Payer: Self-pay | Admitting: Nurse Practitioner

## 2024-02-23 DIAGNOSIS — E876 Hypokalemia: Secondary | ICD-10-CM

## 2024-02-23 NOTE — Telephone Encounter (Signed)
 Requested Prescriptions  Pending Prescriptions Disp Refills   potassium chloride  SA (KLOR-CON  M) 20 MEQ tablet [Pharmacy Med Name: POTASSIUM CL ER 20 MEQ TAB MCR] 90 tablet 0    Sig: TAKE 1 TABLET BY MOUTH EVERY DAY     Endocrinology:  Minerals - Potassium Supplementation Passed - 02/23/2024 11:28 AM      Passed - K in normal range and within 360 days    Potassium  Date Value Ref Range Status  12/06/2023 3.6 3.5 - 5.2 mmol/L Final         Passed - Cr in normal range and within 360 days    Creatinine, Ser  Date Value Ref Range Status  10/05/2023 0.75 0.57 - 1.00 mg/dL Final         Passed - Valid encounter within last 12 months    Recent Outpatient Visits           1 month ago Morbid obesity due to excess calories (HCC)   [place] [facility] [place], [place] T, NP   2 months ago Non-recurrent acute serous otitis media of right ear   [place] [facility] [place], Melanie T, NP   2 months ago Essential hypertension   [place] [facility] [place], [place] T, NP   4 months ago SVT (supraventricular tachycardia)   [place] [facility] [place], Melanie T, NP   11 months ago SVT (supraventricular tachycardia)   [place] [facility] [place], Melanie DASEN, NP

## 2024-03-01 ENCOUNTER — Other Ambulatory Visit: Payer: Self-pay | Admitting: Nurse Practitioner

## 2024-03-01 NOTE — Telephone Encounter (Signed)
 Requested Prescriptions  Pending Prescriptions Disp Refills   ELIQUIS  5 MG TABS tablet [Pharmacy Med Name: ELIQUIS  5 MG TABLET] 180 tablet 0    Sig: TAKE 1 TABLET BY MOUTH TWICE A DAY     Hematology:  Anticoagulants - apixaban  Passed - 03/01/2024  3:59 PM      Passed - PLT in normal range and within 360 days    Platelets  Date Value Ref Range Status  10/05/2023 184 150 - 450 x10E3/uL Final         Passed - HGB in normal range and within 360 days    Hemoglobin  Date Value Ref Range Status  10/05/2023 13.7 11.1 - 15.9 g/dL Final         Passed - HCT in normal range and within 360 days    Hematocrit  Date Value Ref Range Status  10/05/2023 43.5 34.0 - 46.6 % Final         Passed - Cr in normal range and within 360 days    Creatinine, Ser  Date Value Ref Range Status  10/05/2023 0.75 0.57 - 1.00 mg/dL Final         Passed - AST in normal range and within 360 days    AST  Date Value Ref Range Status  10/05/2023 17 0 - 40 IU/L Final         Passed - ALT in normal range and within 360 days    ALT  Date Value Ref Range Status  10/05/2023 15 0 - 32 IU/L Final         Passed - Valid encounter within last 12 months    Recent Outpatient Visits           1 month ago Morbid obesity due to excess calories (HCC)   [place] [facility] [place], [place] T, NP   2 months ago Non-recurrent acute serous otitis media of right ear   [place] [facility] [place], Melanie T, NP   3 months ago Essential hypertension   [place] [facility] [place], [place] T, NP   4 months ago SVT (supraventricular tachycardia)   [place] [facility] [place], Melanie T, NP   11 months ago SVT (supraventricular tachycardia)   [place] [facility] [place], Melanie DASEN, NP

## 2024-04-03 ENCOUNTER — Ambulatory Visit: Admitting: Nurse Practitioner

## 2024-05-07 ENCOUNTER — Ambulatory Visit
